# Patient Record
Sex: Female | Born: 1945 | Race: White | Hispanic: No | Smoking: Never smoker
Health system: Southern US, Community
[De-identification: ages and names within clinical notes are randomized; demographics above are authoritative.]

## PROBLEM LIST (undated history)

## (undated) DIAGNOSIS — M7500 Adhesive capsulitis of unspecified shoulder: Secondary | ICD-10-CM

## (undated) DIAGNOSIS — F32A Depression, unspecified: Secondary | ICD-10-CM

## (undated) DIAGNOSIS — D649 Anemia, unspecified: Secondary | ICD-10-CM

## (undated) DIAGNOSIS — N6019 Diffuse cystic mastopathy of unspecified breast: Secondary | ICD-10-CM

## (undated) DIAGNOSIS — K5790 Diverticulosis of intestine, part unspecified, without perforation or abscess without bleeding: Secondary | ICD-10-CM

## (undated) DIAGNOSIS — F419 Anxiety disorder, unspecified: Secondary | ICD-10-CM

## (undated) DIAGNOSIS — C4491 Basal cell carcinoma of skin, unspecified: Secondary | ICD-10-CM

## (undated) DIAGNOSIS — T7840XA Allergy, unspecified, initial encounter: Secondary | ICD-10-CM

## (undated) DIAGNOSIS — D709 Neutropenia, unspecified: Secondary | ICD-10-CM

## (undated) DIAGNOSIS — M1990 Unspecified osteoarthritis, unspecified site: Secondary | ICD-10-CM

## (undated) DIAGNOSIS — N200 Calculus of kidney: Secondary | ICD-10-CM

## (undated) DIAGNOSIS — F329 Major depressive disorder, single episode, unspecified: Secondary | ICD-10-CM

## (undated) DIAGNOSIS — N760 Acute vaginitis: Secondary | ICD-10-CM

## (undated) DIAGNOSIS — D131 Benign neoplasm of stomach: Secondary | ICD-10-CM

## (undated) DIAGNOSIS — K219 Gastro-esophageal reflux disease without esophagitis: Secondary | ICD-10-CM

## (undated) DIAGNOSIS — G43909 Migraine, unspecified, not intractable, without status migrainosus: Secondary | ICD-10-CM

## (undated) DIAGNOSIS — B019 Varicella without complication: Secondary | ICD-10-CM

## (undated) DIAGNOSIS — J302 Other seasonal allergic rhinitis: Secondary | ICD-10-CM

## (undated) DIAGNOSIS — K279 Peptic ulcer, site unspecified, unspecified as acute or chronic, without hemorrhage or perforation: Secondary | ICD-10-CM

## (undated) DIAGNOSIS — Z8719 Personal history of other diseases of the digestive system: Secondary | ICD-10-CM

## (undated) HISTORY — DX: Allergy, unspecified, initial encounter: T78.40XA

## (undated) HISTORY — DX: Diffuse cystic mastopathy of unspecified breast: N60.19

## (undated) HISTORY — DX: Varicella without complication: B01.9

## (undated) HISTORY — PX: COLONOSCOPY: SHX174

## (undated) HISTORY — DX: Anemia, unspecified: D64.9

## (undated) HISTORY — DX: Peptic ulcer, site unspecified, unspecified as acute or chronic, without hemorrhage or perforation: K27.9

## (undated) HISTORY — DX: Neutropenia, unspecified: D70.9

## (undated) HISTORY — DX: Anxiety disorder, unspecified: F41.9

## (undated) HISTORY — DX: Diverticulosis of intestine, part unspecified, without perforation or abscess without bleeding: K57.90

## (undated) HISTORY — DX: Unspecified osteoarthritis, unspecified site: M19.90

## (undated) HISTORY — DX: Gastro-esophageal reflux disease without esophagitis: K21.9

## (undated) HISTORY — DX: Migraine, unspecified, not intractable, without status migrainosus: G43.909

---

## 1982-09-10 HISTORY — PX: ABDOMINAL HYSTERECTOMY: SHX81

## 2004-10-25 ENCOUNTER — Ambulatory Visit: Payer: Self-pay | Admitting: Internal Medicine

## 2004-10-30 ENCOUNTER — Ambulatory Visit: Payer: Self-pay | Admitting: Internal Medicine

## 2005-10-04 ENCOUNTER — Ambulatory Visit: Payer: Self-pay | Admitting: Oncology

## 2006-02-27 ENCOUNTER — Ambulatory Visit: Payer: Self-pay | Admitting: Internal Medicine

## 2007-03-05 ENCOUNTER — Ambulatory Visit: Payer: Self-pay | Admitting: Internal Medicine

## 2008-05-24 ENCOUNTER — Ambulatory Visit: Payer: Self-pay | Admitting: Internal Medicine

## 2009-05-30 ENCOUNTER — Ambulatory Visit: Payer: Self-pay | Admitting: Internal Medicine

## 2009-06-02 ENCOUNTER — Ambulatory Visit: Payer: Self-pay | Admitting: Internal Medicine

## 2010-05-31 ENCOUNTER — Ambulatory Visit: Payer: Self-pay | Admitting: Internal Medicine

## 2010-08-15 ENCOUNTER — Ambulatory Visit: Payer: Self-pay | Admitting: Internal Medicine

## 2010-08-28 ENCOUNTER — Ambulatory Visit: Payer: Self-pay | Admitting: Internal Medicine

## 2010-09-12 ENCOUNTER — Ambulatory Visit: Payer: Self-pay | Admitting: Urology

## 2010-10-05 ENCOUNTER — Ambulatory Visit: Payer: Self-pay | Admitting: Urology

## 2010-10-16 ENCOUNTER — Ambulatory Visit: Payer: Self-pay | Admitting: Gastroenterology

## 2011-01-15 ENCOUNTER — Ambulatory Visit: Payer: Self-pay | Admitting: Urology

## 2011-07-12 ENCOUNTER — Ambulatory Visit: Payer: Self-pay | Admitting: Internal Medicine

## 2011-07-24 ENCOUNTER — Ambulatory Visit: Payer: Self-pay | Admitting: Internal Medicine

## 2011-11-19 DIAGNOSIS — F32A Depression, unspecified: Secondary | ICD-10-CM | POA: Insufficient documentation

## 2011-11-19 DIAGNOSIS — N760 Acute vaginitis: Secondary | ICD-10-CM | POA: Insufficient documentation

## 2011-11-19 DIAGNOSIS — K219 Gastro-esophageal reflux disease without esophagitis: Secondary | ICD-10-CM | POA: Insufficient documentation

## 2011-11-19 DIAGNOSIS — F320 Major depressive disorder, single episode, mild: Secondary | ICD-10-CM | POA: Insufficient documentation

## 2011-11-19 DIAGNOSIS — Z8719 Personal history of other diseases of the digestive system: Secondary | ICD-10-CM | POA: Insufficient documentation

## 2011-11-29 ENCOUNTER — Ambulatory Visit: Payer: Self-pay | Admitting: Family Medicine

## 2012-05-06 DIAGNOSIS — M7500 Adhesive capsulitis of unspecified shoulder: Secondary | ICD-10-CM | POA: Insufficient documentation

## 2012-05-06 DIAGNOSIS — M19019 Primary osteoarthritis, unspecified shoulder: Secondary | ICD-10-CM | POA: Insufficient documentation

## 2012-05-21 DIAGNOSIS — M542 Cervicalgia: Secondary | ICD-10-CM | POA: Insufficient documentation

## 2012-07-11 ENCOUNTER — Telehealth: Payer: Self-pay | Admitting: *Deleted

## 2012-07-11 MED ORDER — ZOLPIDEM TARTRATE 5 MG PO TABS: 5.0000 mg | ORAL_TABLET | Freq: Every evening | ORAL | Status: DC | PRN

## 2012-07-11 NOTE — Telephone Encounter (Signed)
Medication was called in 10/25 by Dr.Scott.

## 2012-07-17 ENCOUNTER — Telehealth: Payer: Self-pay | Admitting: Internal Medicine

## 2012-07-17 NOTE — Telephone Encounter (Signed)
Pt called her pharmacy has been sending a refill request since last Friday pt is out of [facility] meds [facility]  acifex she thinks 40mg   1 daily  This is the one pt has been out of   cymbalta 60mg   1 daily  vivelledot patch .0375 mg    Pt stated she is been out of meds for a week Please advise pt when this is called in

## 2012-07-17 NOTE — Telephone Encounter (Signed)
Caller: [name]/Patient; Patient Name: Brandi Ramos; PCP: [facility]; Best Callback Phone Number: [phone].  Pt calling today 07/17/12 regarding has been trying to get her medication refilled since 07/11/12.  Has contacted her pharmacy and made calls to office and medications still have not been called to her pharmacy.  Triager pulled chart in EPIC and see note where pt called in requesting refills and see note from Carrie Mew requesting MD refill meds and then note from Dr. Lorin Picket advising ok to refill with 3 refills each, however triager not able to view medication list to verify medication orders. The only medication listed under the medication tab is Ambien. OFFICE PLEASE CALL THESE SCRIPTS IN ASAP FOR PT SHE IS COMPLETLEY OUT OF MEDICATION.  [facility] [place] [phone].  PLEASE CALL PT BACK AT [phone] TO LET HER KNOW THIS HAS BEEN DONE ASAP.

## 2012-07-17 NOTE — Telephone Encounter (Signed)
Ok to refill each x 3.  i think the aciphex should be 20mg  q day.  Will need to clarify directions with her pharmacy and then can refill x 3

## 2012-07-18 MED ORDER — DULOXETINE HCL 60 MG PO CPEP: 60.0000 mg | ORAL_CAPSULE | Freq: Every day | ORAL | Status: DC

## 2012-07-18 MED ORDER — ESTRADIOL 0.0375 MG/24HR TD PTTW: 1.0000 | MEDICATED_PATCH | TRANSDERMAL | Status: DC

## 2012-07-18 NOTE — Telephone Encounter (Signed)
Prescriptions phoned into pharmacy.

## 2012-08-11 ENCOUNTER — Other Ambulatory Visit: Payer: Self-pay | Admitting: Internal Medicine

## 2012-08-11 NOTE — Telephone Encounter (Signed)
Pt is needing refill on Tropranolol 40 mg. [facility] [place]. She does have an appointment coming up on 09/23/11

## 2012-08-11 NOTE — Telephone Encounter (Signed)
Ok to refill propranolol 40mg  but will need to clarify with pt how she takes.  Ok to refill until appt.

## 2012-08-12 ENCOUNTER — Other Ambulatory Visit: Payer: Self-pay | Admitting: *Deleted

## 2012-08-12 MED ORDER — PROPRANOLOL HCL 40 MG PO TABS: 40.0000 mg | ORAL_TABLET | Freq: Three times a day (TID) | ORAL | Status: DC

## 2012-08-12 NOTE — Telephone Encounter (Signed)
Filled script for propranolol 40 mg

## 2012-08-12 NOTE — Telephone Encounter (Signed)
Called patient to clarify dosage of Propranolol  on cell and home phones;no answer on both lines and voice mail did not pick up

## 2012-08-29 ENCOUNTER — Telehealth: Payer: Self-pay | Admitting: Internal Medicine

## 2012-08-29 NOTE — Telephone Encounter (Signed)
Pt is needing refills on Topiramiate 25 mg and Ambien 5 mg (Generic) and [facility] [place].

## 2012-09-01 NOTE — Telephone Encounter (Signed)
Called in #30 with no refills ambien.  Called in topamax take 6 tablets daily #180 with 2 refills.

## 2012-09-21 ENCOUNTER — Encounter: Payer: Self-pay | Admitting: Internal Medicine

## 2012-09-21 DIAGNOSIS — K2960 Other gastritis without bleeding: Secondary | ICD-10-CM

## 2012-09-21 DIAGNOSIS — G43909 Migraine, unspecified, not intractable, without status migrainosus: Secondary | ICD-10-CM | POA: Insufficient documentation

## 2012-09-21 DIAGNOSIS — D72819 Decreased white blood cell count, unspecified: Secondary | ICD-10-CM | POA: Insufficient documentation

## 2012-09-22 ENCOUNTER — Encounter: Payer: Self-pay | Admitting: Internal Medicine

## 2012-09-22 ENCOUNTER — Ambulatory Visit (INDEPENDENT_AMBULATORY_CARE_PROVIDER_SITE_OTHER): Payer: BC Managed Care – PPO | Admitting: Internal Medicine

## 2012-09-22 VITALS — BP 116/74 | HR 64 | Temp 98.7°F | Ht 64.0 in | Wt 119.8 lb

## 2012-09-22 DIAGNOSIS — Z139 Encounter for screening, unspecified: Secondary | ICD-10-CM

## 2012-09-22 DIAGNOSIS — G43909 Migraine, unspecified, not intractable, without status migrainosus: Secondary | ICD-10-CM

## 2012-09-22 DIAGNOSIS — R5381 Other malaise: Secondary | ICD-10-CM

## 2012-09-22 DIAGNOSIS — R5383 Other fatigue: Secondary | ICD-10-CM

## 2012-09-22 DIAGNOSIS — D72819 Decreased white blood cell count, unspecified: Secondary | ICD-10-CM

## 2012-09-22 LAB — LIPID PANEL
HDL: 54.3 mg/dL (ref >39.00)
LDL Cholesterol: 104 mg/dL — ABNORMAL HIGH (ref 0–99)
Total CHOL/HDL Ratio: 3
Triglycerides: 69 mg/dL (ref 0.0–149.0)
VLDL: 13.8 mg/dL (ref 0.0–40.0)

## 2012-09-22 LAB — CBC WITH DIFFERENTIAL/PLATELET
Basophils Absolute: 0 10*3/uL (ref 0.0–0.1)
Eosinophils Absolute: 0.1 10*3/uL (ref 0.0–0.7)
Hemoglobin: 12.5 g/dL (ref 12.0–15.0)
Lymphocytes Relative: 25.1 % (ref 12.0–46.0)
MCHC: 33.5 g/dL (ref 30.0–36.0)
Neutro Abs: 2.8 10*3/uL (ref 1.4–7.7)
RDW: 13.6 % (ref 11.5–14.6)

## 2012-09-22 LAB — COMPREHENSIVE METABOLIC PANEL
ALT: 15 U/L (ref 0–35)
AST: 23 U/L (ref 0–37)
Alkaline Phosphatase: 48 U/L (ref 39–117)
Calcium: 8.7 mg/dL (ref 8.4–10.5)
Chloride: 107 mEq/L (ref 96–112)
Creatinine, Ser: 0.7 mg/dL (ref 0.4–1.2)
Total Bilirubin: 0.5 mg/dL (ref 0.3–1.2)

## 2012-09-22 MED ORDER — ZOLPIDEM TARTRATE ER 6.25 MG PO TBCR: 6.2500 mg | EXTENDED_RELEASE_TABLET | Freq: Every evening | ORAL | Status: DC | PRN

## 2012-09-22 MED ORDER — FLUTICASONE PROPIONATE 50 MCG/ACT NA SUSP: 2.0000 | Freq: Every day | NASAL | Status: DC

## 2012-09-22 NOTE — Progress Notes (Signed)
Subjective:    Patient ID: Brandi Ramos, female    DOB: 03-13-1946, 67 y.o.   MRN: [id number]  HPI 67 year old female with past history of migraine/tension headaches, FCD and diverticulosis who comes in today to follow up on these issues as well as for a complete physical exam.  She states she is doing relatively well.  Planning to retire 4/14.  She was having neck and left shoulder discomfort.  Was seen in [place].  Xray revealed degenerative changes - C2-C3.  (Dr Roselyn Bering and Dr Dorann Ou).  She has follow up with neurology this week.  Headaches stable.  Some minimal congestion.  No significant cough.  No sob.    Past Medical History  Diagnosis Date  . Migraine headache   . Neutropenia     worked up - Dr Doylene Canning, slightly elevated ANA  . PUD (peptic ulcer disease)   . Anxiety   . Fibrocystic breast disease   . Diverticulosis   . GERD (gastroesophageal reflux disease)   . Arthritis   . Chicken pox      Current Outpatient Prescriptions on File Prior to Visit  Medication Sig Dispense Refill  . DULoxetine (CYMBALTA) 60 MG capsule Take 1 capsule (60 mg total) by mouth daily.  30 capsule  3  . estradiol (VIVELLE-DOT) 0.0375 MG/24HR Place 1 patch onto the skin as directed.  8 patch  3  . fexofenadine (ALLEGRA) 60 MG tablet Take 60 mg by mouth 2 (two) times daily.      . propranolol (INDERAL) 40 MG tablet Take 40 mg by mouth 2 (two) times daily.      . rizatriptan (MAXALT) 10 MG tablet Take 10 mg by mouth as needed. May repeat in 2 hours if needed      . fluticasone (FLONASE) 50 MCG/ACT nasal spray Place 2 sprays into the nose daily.  16 g  1  . RABEprazole Sodium (ACIPHEX PO) Take 20 mg by mouth daily.         Review of Systems Patient denies any headache currently.  No lightheadedness or dizziness. Some nasal congestion.  No chest pain, tightness or palpitations.  No increased shortness of breath, cough or congestion.  No nausea or vomiting.  No abdominal pain or cramping.  No bowel  change, such as diarrhea, constipation, BRBPR or melana.  No urine change.   Saw chiropractor for her neck and shoulder.  Doing better.       Objective:   Physical Exam Filed Vitals:   09/22/12 0800  BP: 116/74  Pulse: 64  Temp: 98.7 F (66.57 C)   67 year old female in no acute distress.   HEENT:  Nares- clear.  Oropharynx - without lesions. NECK:  Supple.  Nontender.  No audible bruit.  HEART:  Appears to be regular. LUNGS:  No crackles or wheezing audible.  Respirations even and unlabored.  RADIAL PULSE:  Equal bilaterally.    BREASTS:  No nipple discharge or nipple retraction present.  Could not appreciate any distinct nodules or axillary adenopathy.  ABDOMEN:  Soft, nontender.  Bowel sounds present and normal.  No audible abdominal bruit.  GU:  Normal external genitalia.  Vaginal vault without lesions.  S/p hysterectomy.  No pap performed.  Could not appreciate any adnexal masses or tenderness.   RECTAL:  Heme negative.   EXTREMITIES:  No increased edema present.  DP pulses palpable and equal bilaterally.           Assessment & Plan:  INCREASED  PSYCHOSOCIAL STRESSORS.  Doing better.  Follow.   NASAL CONGESTION.  Saline nasal flushes.  Flonase nasal spray as directed.  Notify me if symptoms worsened.    CARDIOVASCULAR.  Asymptomatic.    PULMONARY.  Breathing stable.  Follow.    HEALTH MAINTENANCE.  Physical today.  S/p hysterectomy and does not require yearly pap smears.  Colonoscopy 04/26/08 - adenomatous polyp in the cecum, diverticulosis and internal hemorrhoids.  Recommended follow up colonoscopy in five years.  Bone density normal 06/22/08.

## 2012-09-24 ENCOUNTER — Telehealth: Payer: Self-pay | Admitting: Internal Medicine

## 2012-09-24 ENCOUNTER — Encounter: Payer: Self-pay | Admitting: *Deleted

## 2012-09-24 MED ORDER — AMOXICILLIN 875 MG PO TABS: 875.0000 mg | ORAL_TABLET | Freq: Two times a day (BID) | ORAL | Status: AC

## 2012-09-24 NOTE — Telephone Encounter (Signed)
Pt is saying that she has been taking Mucinex and nasal spray (Flonase) since Monday and it has not worked at all. She is owrse with coughing up mucous and more stuffed up. She was wanting to know if an antibiotic could be called in ??  [facility] [place]

## 2012-09-24 NOTE — Telephone Encounter (Signed)
Patient called to confirm no allergy to penicillin or amoxicillin. Patient sated that there was no allergy. Prescription called in to pharmacy.

## 2012-09-24 NOTE — Telephone Encounter (Signed)
Confirm no allergy to penicillin or amoxicillin.  If no then can call in amoxicillin 875mg  bid x 10 days.  Let me know if persistent problems.

## 2012-09-28 ENCOUNTER — Encounter: Payer: Self-pay | Admitting: Internal Medicine

## 2012-09-28 NOTE — Assessment & Plan Note (Signed)
Seeing neurology.  Stable.  Has follow up this week.

## 2012-09-28 NOTE — Assessment & Plan Note (Signed)
Recheck cbc.  Has been worked up by hematology.

## 2012-10-07 ENCOUNTER — Ambulatory Visit: Payer: Self-pay | Admitting: Internal Medicine

## 2012-10-27 ENCOUNTER — Encounter: Payer: Self-pay | Admitting: Internal Medicine

## 2012-10-27 ENCOUNTER — Other Ambulatory Visit: Payer: Self-pay | Admitting: Internal Medicine

## 2012-10-27 NOTE — Telephone Encounter (Signed)
Pt is wanting to know if she could get her Xanax refilled.

## 2012-10-28 MED ORDER — ALPRAZOLAM 0.25 MG PO TABS: 0.2500 mg | ORAL_TABLET | Freq: Every day | ORAL | Status: DC | PRN

## 2012-10-28 NOTE — Telephone Encounter (Signed)
Xanax is not on her me list, 25 mg

## 2012-10-28 NOTE — Telephone Encounter (Signed)
Pt calling again regarding xanax, states she needs more.  Pt states she has an appt with Dr. Lorin Picket on Thursday morning but wants more medication before that appointment because she does not want to run out.  [facility] [place]  Pt will be at work until 4p.m. [phone].  After 5pm she will be home [phone].

## 2012-10-28 NOTE — Telephone Encounter (Signed)
Called in xanax .25mg  one po q day #20 with one refill.

## 2012-10-30 ENCOUNTER — Encounter: Payer: Self-pay | Admitting: Internal Medicine

## 2012-10-30 ENCOUNTER — Ambulatory Visit (INDEPENDENT_AMBULATORY_CARE_PROVIDER_SITE_OTHER): Payer: BC Managed Care – PPO | Admitting: Internal Medicine

## 2012-10-30 VITALS — BP 120/70 | HR 65 | Temp 97.9°F | Ht 61.0 in

## 2012-10-30 DIAGNOSIS — G43909 Migraine, unspecified, not intractable, without status migrainosus: Secondary | ICD-10-CM

## 2012-10-30 MED ORDER — ALPRAZOLAM 0.25 MG PO TABS: ORAL_TABLET | ORAL | Status: DC

## 2012-10-30 MED ORDER — FLUCONAZOLE 150 MG PO TABS: 150.0000 mg | ORAL_TABLET | Freq: Once | ORAL | Status: DC

## 2012-11-09 ENCOUNTER — Encounter: Payer: Self-pay | Admitting: Internal Medicine

## 2012-11-09 NOTE — Progress Notes (Signed)
Subjective:    Patient ID: Brandi Ramos, female    DOB: 02/25/1946, 67 y.o.   MRN: [id number]  HPI 67 year old female with past history of migraine/tension headaches, FCD and diverticulosis who comes in today as a work in to discuss some recent increased stress.   She states she had been doing relatively well.  Planning to retire 4/14.  She had vomiting and diarrhea a couple of weeks ago.  Increased stress with the snow.  Started with some increased stress and issues with her husbands death.  Became more anxious.  Started taking xanax bid and this has helped.  She feels better now.  Still taking xanax.  Trying to cut down to q day now.  On paxil.   Physically, bowels are back to normal. Eating now.      Past Medical History  Diagnosis Date  . Migraine headache   . Neutropenia     worked up - Dr Doylene Canning, slightly elevated ANA  . PUD (peptic ulcer disease)   . Anxiety   . Fibrocystic breast disease   . Diverticulosis   . GERD (gastroesophageal reflux disease)   . Arthritis   . Chicken pox      Current Outpatient Prescriptions on File Prior to Visit  Medication Sig Dispense Refill  . DULoxetine (CYMBALTA) 60 MG capsule Take 1 capsule (60 mg total) by mouth daily.  30 capsule  3  . estradiol (VIVELLE-DOT) 0.0375 MG/24HR Place 1 patch onto the skin as directed.  8 patch  3  . fexofenadine (ALLEGRA) 60 MG tablet Take 60 mg by mouth 2 (two) times daily.      . fluticasone (FLONASE) 50 MCG/ACT nasal spray Place 2 sprays into the nose daily.  16 g  1  . propranolol (INDERAL) 40 MG tablet Take 40 mg by mouth daily.       . RABEprazole Sodium (ACIPHEX PO) Take 20 mg by mouth daily.       . rizatriptan (MAXALT) 10 MG tablet Take 10 mg by mouth as needed. May repeat in 2 hours if needed      . Topiramate ER (TROKENDI XR) 50 MG CP24 Take 1 tablet by mouth at bedtime.       Marland Kitchen zolpidem (AMBIEN CR) 6.25 MG CR tablet Take 1 tablet (6.25 mg total) by mouth at bedtime as needed for sleep.  30 tablet  0    No current facility-administered medications on file prior to visit.    Review of Systems Patient denies any headache currently.  No lightheadedness or dizziness.  No chest pain, tightness or palpitations.  No increased shortness of breath, cough or congestion.  No nausea or vomiting now.  Eating normally now.  Appetite improved.   No abdominal pain or cramping.  No bowel change, such as diarrhea, constipation, BRBPR or melana.  Diarrhea resolved.  No urine change.   Saw chiropractor for her neck and shoulder.  Doing better.  Increased stress and anxiety as outlined.  Doing better.         Objective:   Physical Exam  Filed Vitals:   10/30/12 0801  BP: 120/70  Pulse: 65  Temp: 97.9 F (71.18 C)   67 year old female in no acute distress.   HEENT:  Nares- clear.  Oropharynx - without lesions. NECK:  Supple.  Nontender.  No audible bruit.  HEART:  Appears to be regular. LUNGS:  No crackles or wheezing audible.  Respirations even and unlabored.  RADIAL PULSE:  Equal bilaterally.  ABDOMEN:  Soft, nontender.  Bowel sounds present and normal.  No audible abdominal bruit.   EXTREMITIES:  No increased edema present.  DP pulses palpable and equal bilaterally.           Assessment & Plan:  INCREASED PSYCHOSOCIAL STRESSORS.  See above.  Doing better.   Taking xanax.  Will continue xanax for now.  Follow.  On paxil.  Get her back in soon to reassess.  Follow closely.      CARDIOVASCULAR.  Asymptomatic.    PULMONARY.  Breathing stable.  Follow.    HEALTH MAINTENANCE.  Physical last visit.  S/p hysterectomy and does not require yearly pap smears.  Colonoscopy 04/26/08 - adenomatous polyp in the cecum, diverticulosis and internal hemorrhoids.  Recommended follow up colonoscopy in five years.  Bone density normal 06/22/08.

## 2012-11-09 NOTE — Assessment & Plan Note (Signed)
Seeing neurology.  Stable.   

## 2012-11-24 ENCOUNTER — Encounter: Payer: Self-pay | Admitting: Internal Medicine

## 2012-11-24 ENCOUNTER — Ambulatory Visit (INDEPENDENT_AMBULATORY_CARE_PROVIDER_SITE_OTHER): Payer: BC Managed Care – PPO | Admitting: Internal Medicine

## 2012-11-24 VITALS — BP 120/60 | HR 80 | Temp 97.7°F | Ht 61.0 in

## 2012-11-24 DIAGNOSIS — R1013 Epigastric pain: Secondary | ICD-10-CM

## 2012-11-24 DIAGNOSIS — D72819 Decreased white blood cell count, unspecified: Secondary | ICD-10-CM

## 2012-11-24 DIAGNOSIS — R0989 Other specified symptoms and signs involving the circulatory and respiratory systems: Secondary | ICD-10-CM

## 2012-11-24 DIAGNOSIS — G43909 Migraine, unspecified, not intractable, without status migrainosus: Secondary | ICD-10-CM

## 2012-11-24 LAB — CBC WITH DIFFERENTIAL/PLATELET
Basophils Relative: 0.2 % (ref 0.0–3.0)
Eosinophils Absolute: 0 10*3/uL (ref 0.0–0.7)
Eosinophils Relative: 0.1 % (ref 0.0–5.0)
Lymphocytes Relative: 24 % (ref 12.0–46.0)
Monocytes Absolute: 0.6 10*3/uL (ref 0.1–1.0)
Neutrophils Relative %: 65.7 % (ref 43.0–77.0)
Platelets: 163 10*3/uL (ref 150.0–400.0)
RBC: 4.09 Mil/uL (ref 3.87–5.11)
WBC: 5.8 10*3/uL (ref 4.5–10.5)

## 2012-11-24 LAB — LIPASE: Lipase: 129 U/L — ABNORMAL HIGH (ref 11.0–59.0)

## 2012-11-24 LAB — HEPATIC FUNCTION PANEL
ALT: 18 U/L (ref 0–35)
Alkaline Phosphatase: 46 U/L (ref 39–117)
Bilirubin, Direct: 0.1 mg/dL (ref 0.0–0.3)
Total Bilirubin: 0.8 mg/dL (ref 0.3–1.2)
Total Protein: 6.7 g/dL (ref 6.0–8.3)

## 2012-11-25 ENCOUNTER — Ambulatory Visit: Payer: Self-pay | Admitting: Internal Medicine

## 2012-11-27 ENCOUNTER — Telehealth: Payer: Self-pay | Admitting: Internal Medicine

## 2012-11-27 NOTE — Telephone Encounter (Signed)
Patient wanting her ultrasound results.

## 2012-11-28 NOTE — Telephone Encounter (Signed)
See other message

## 2012-11-28 NOTE — Telephone Encounter (Signed)
Patient calling again wanting her ultrasound results.

## 2012-11-30 ENCOUNTER — Encounter: Payer: Self-pay | Admitting: Internal Medicine

## 2012-11-30 NOTE — Progress Notes (Signed)
Subjective:    Patient ID: Brandi Ramos, female    DOB: 19-Apr-1946, 67 y.o.   MRN: [id number]  HPI 67 year old female with past history of migraine/tension headaches, FCD and diverticulosis who comes in today for a scheduled follow up.  I saw her last visit and she was having some increased anxiety and stress.  See last note for details.  Was instructed she could use the xanax as directed.  She is now taking it q day.  (has cut down from bid).  Feels better and is overall doing better.  Planning to retire 4/14.  Is a little anxious about this. She is having some epigastric discomfort.  Intermittent.  When her stomach is empty - worse.  Food does not make it worse.  No nausea or vomiting.  Is having some minimal acid reflux.  She called Dr Reyes Ivan office.  They instructed her to take carafate bid and continue her aciphex q day.  She is due to follow up there next week.  Bowels appear to be stable.       Past Medical History  Diagnosis Date  . Migraine headache   . Neutropenia     worked up - Dr Doylene Canning, slightly elevated ANA  . PUD (peptic ulcer disease)   . Anxiety   . Fibrocystic breast disease   . Diverticulosis   . GERD (gastroesophageal reflux disease)   . Arthritis   . Chicken pox      Current Outpatient Prescriptions on File Prior to Visit  Medication Sig Dispense Refill  . ALPRAZolam (XANAX) 0.25 MG tablet One tablet bid prn  40 tablet  0  . DULoxetine (CYMBALTA) 60 MG capsule Take 1 capsule (60 mg total) by mouth daily.  30 capsule  3  . estradiol (VIVELLE-DOT) 0.0375 MG/24HR Place 1 patch onto the skin as directed.  8 patch  3  . fexofenadine (ALLEGRA) 60 MG tablet Take 60 mg by mouth 2 (two) times daily.      . fluticasone (FLONASE) 50 MCG/ACT nasal spray Place 2 sprays into the nose daily.  16 g  1  . propranolol (INDERAL) 40 MG tablet Take 40 mg by mouth daily.       . RABEprazole Sodium (ACIPHEX PO) Take 20 mg by mouth daily.       . rizatriptan (MAXALT) 10 MG tablet  Take 10 mg by mouth as needed. May repeat in 2 hours if needed      . Topiramate ER (TROKENDI XR) 50 MG CP24 Take 1 tablet by mouth at bedtime.       Marland Kitchen zolpidem (AMBIEN CR) 6.25 MG CR tablet Take 1 tablet (6.25 mg total) by mouth at bedtime as needed for sleep.  30 tablet  0  . fluconazole (DIFLUCAN) 150 MG tablet Take 1 tablet (150 mg total) by mouth once.  1 tablet  0   No current facility-administered medications on file prior to visit.    Review of Systems Patient denies any headache currently.  No lightheadedness or dizziness.  No chest pain, tightness or palpitations.  No increased shortness of breath, cough or congestion.  No nausea or vomiting.  Eating normally now.  Appetite improved.  Does report the epigastric discomfort as outlined.  No bowel change, such as diarrhea, constipation, BRBPR or melana.  No urine change.   Stress and anxiety improved.  Doing better.         Objective:   Physical Exam  Filed Vitals:   11/24/12 7829  BP: 120/60  Pulse: 80  Temp: 97.7 F (77.109 C)   67 year old female in no acute distress.   HEENT:  Nares- clear.  Oropharynx - without lesions. NECK:  Supple.  Nontender.  No audible bruit.  HEART:  Appears to be regular. LUNGS:  No crackles or wheezing audible.  Respirations even and unlabored.  RADIAL PULSE:  Equal bilaterally.  ABDOMEN:  Soft.  Minimal tenderness to palpation over the epigastric region.   Bowel sounds present and normal.  Audible abdominal bruit.   EXTREMITIES:  No increased edema present.  DP pulses palpable and equal bilaterally.           Assessment & Plan:  INCREASED PSYCHOSOCIAL STRESSORS.  See above.  Doing better.   Taking xanax daily now.  Down from bid.   Will continue xanax for now.  Follow.  On paxil.  Follow closely.  EPIGASTRIC PAIN.  Will check cbc, liver panel and amylase/lipase.  Schedule an abdominal ultrasound (also to evaluate the aorta- given bruit).  Continue on the PPI and carafate.  Keep appt with GI next  week.  If any acute change in symptoms or problems, will need to be reevaluated.       CARDIOVASCULAR.  Asymptomatic.    PULMONARY.  Breathing stable.  Follow.    HEALTH MAINTENANCE.  Physical 09/22/12.  S/p hysterectomy and does not require yearly pap smears.  Colonoscopy 04/26/08 - adenomatous polyp in the cecum, diverticulosis and internal hemorrhoids.  Recommended follow up colonoscopy in five years.  Bone density normal 06/22/08.

## 2012-11-30 NOTE — Assessment & Plan Note (Signed)
Seeing neurology.  Stable.   

## 2012-11-30 NOTE — Assessment & Plan Note (Signed)
Recheck cbc.  Has been worked up by hematology.  Has been stable.

## 2012-12-08 ENCOUNTER — Ambulatory Visit: Payer: Self-pay | Admitting: Gastroenterology

## 2012-12-10 LAB — PATHOLOGY REPORT

## 2012-12-13 DIAGNOSIS — K2960 Other gastritis without bleeding: Secondary | ICD-10-CM | POA: Insufficient documentation

## 2012-12-15 ENCOUNTER — Other Ambulatory Visit: Payer: Self-pay | Admitting: *Deleted

## 2012-12-15 ENCOUNTER — Other Ambulatory Visit: Payer: Self-pay | Admitting: Internal Medicine

## 2012-12-15 MED ORDER — DULOXETINE HCL 60 MG PO CPEP: 60.0000 mg | ORAL_CAPSULE | Freq: Every day | ORAL | Status: DC

## 2012-12-15 NOTE — Telephone Encounter (Signed)
Refill request  Rabeprazole 20 mg tab  #30  Take one tablet by mouth every day

## 2012-12-15 NOTE — Telephone Encounter (Signed)
Duloxetine refill needed.

## 2012-12-16 ENCOUNTER — Ambulatory Visit: Payer: Self-pay | Admitting: Gastroenterology

## 2012-12-16 MED ORDER — RABEPRAZOLE SODIUM 20 MG PO TBEC: 20.0000 mg | DELAYED_RELEASE_TABLET | Freq: Every day | ORAL | Status: DC

## 2012-12-16 MED ORDER — ESTRADIOL 0.0375 MG/24HR TD PTTW: 1.0000 | MEDICATED_PATCH | TRANSDERMAL | Status: DC

## 2012-12-16 NOTE — Telephone Encounter (Signed)
Rx sent in to pharmacy. 

## 2012-12-25 ENCOUNTER — Encounter: Payer: Self-pay | Admitting: Internal Medicine

## 2012-12-29 DIAGNOSIS — M25569 Pain in unspecified knee: Secondary | ICD-10-CM | POA: Insufficient documentation

## 2012-12-29 DIAGNOSIS — D481 Neoplasm of uncertain behavior of connective and other soft tissue: Secondary | ICD-10-CM | POA: Insufficient documentation

## 2013-01-05 ENCOUNTER — Other Ambulatory Visit: Payer: Self-pay | Admitting: Internal Medicine

## 2013-01-05 MED ORDER — ALPRAZOLAM 0.25 MG PO TABS: ORAL_TABLET | ORAL | Status: DC

## 2013-01-05 NOTE — Telephone Encounter (Signed)
Refilled xanax x 1 

## 2013-01-05 NOTE — Telephone Encounter (Signed)
30 day Rx faxed

## 2013-01-05 NOTE — Telephone Encounter (Signed)
Cell phone # [phone] Pt called checking on her rx  asifex  Pt is out of These she stated [facility] request  last week Pt also needs refill alprazolam   Pt has 2 left   Please advise pt when these have been called in

## 2013-01-05 NOTE — Telephone Encounter (Signed)
Pt requesting RF on Xanax- Aciphex was sent in on 4/8 (pharmacy confirmed) & pt aware

## 2013-01-21 ENCOUNTER — Ambulatory Visit (INDEPENDENT_AMBULATORY_CARE_PROVIDER_SITE_OTHER): Payer: 59 | Admitting: Internal Medicine

## 2013-01-21 ENCOUNTER — Encounter: Payer: Self-pay | Admitting: Internal Medicine

## 2013-01-21 VITALS — BP 120/70 | HR 71 | Temp 98.6°F | Ht 61.0 in | Wt 119.8 lb

## 2013-01-21 DIAGNOSIS — K2960 Other gastritis without bleeding: Secondary | ICD-10-CM

## 2013-01-21 DIAGNOSIS — D72819 Decreased white blood cell count, unspecified: Secondary | ICD-10-CM

## 2013-01-21 DIAGNOSIS — G43909 Migraine, unspecified, not intractable, without status migrainosus: Secondary | ICD-10-CM

## 2013-01-21 MED ORDER — ALPRAZOLAM 0.25 MG PO TABS: 0.2500 mg | ORAL_TABLET | Freq: Every day | ORAL | Status: DC | PRN

## 2013-01-22 ENCOUNTER — Telehealth: Payer: Self-pay | Admitting: *Deleted

## 2013-01-22 NOTE — Telephone Encounter (Signed)
Pt informed

## 2013-01-22 NOTE — Telephone Encounter (Signed)
Tell Brandi Ramos that I called her pharmacy and they are faxing over a prior authorization form for her cymbalta. The pharmacist said that they would give her some to have until we can get the prior auth done.  Said for her just to come in and they will give her some.

## 2013-01-22 NOTE — Telephone Encounter (Signed)
Pt was seen yesterday & mentioned that her Cymbalta req'd a PA. Wants to know if she can restart her Paxil until we get approval for the Cymbalta. Please advise

## 2013-01-23 ENCOUNTER — Telehealth: Payer: Self-pay | Admitting: *Deleted

## 2013-01-23 NOTE — Telephone Encounter (Signed)
Called [phone] for prior authorization on the Duloxtine DR 60 mg, form is being faxed over now

## 2013-01-26 ENCOUNTER — Telehealth: Payer: Self-pay | Admitting: *Deleted

## 2013-01-26 NOTE — Telephone Encounter (Signed)
Called pt to let her know that her Prior authorization for Duloxtine DR 60 mg was still in Process

## 2013-01-26 NOTE — Telephone Encounter (Signed)
Pt called and to give you a number to get her prior authoization   [phone]  For cymbolta  Pt stated that if you call this number today this will be taken care of today. Pt stated she has been out of meds for 2 weeks.  Please call pt when this is done  Pt is completely out of meds Pt is upset that it is taking so long to get her meds  Please explain to her why.

## 2013-01-26 NOTE — Telephone Encounter (Signed)
Criteria question fax form was placed in Dr. Anne Hahn

## 2013-01-26 NOTE — Telephone Encounter (Signed)
Called [phone] for Prior authorization status and they said the PA is in Process and a fax of denial or approval will be faxed either today or tomorrow

## 2013-01-27 NOTE — Telephone Encounter (Signed)
Prior authorization for Cymbalta was Denied, called [phone] to see why it was denied, they stated the needed a Letter from the pt doctor stating why she needs the medications and what other medications has she tried?  Fax letter : [phone]

## 2013-01-27 NOTE — Telephone Encounter (Signed)
Spoke with pt, & let her know that we still haven't heard from her ins company. I told her that if we havent heard by Friday, we will see about switching back to Paxil at her request. The pharmacy gave her 5 days worth for now.

## 2013-01-27 NOTE — Telephone Encounter (Signed)
The patient is going to call the pharmacy to see if they will give her a few days of her Cymbalta . She does not want to go any longer without having her medication . She is willing to go back on Paxil only after all efforts to get her medication approved by the insurance company are exhausted.

## 2013-01-28 NOTE — Telephone Encounter (Signed)
Prior Authorization Form for filled out by Dr. Lorin Picket and faxed to [phone]

## 2013-01-28 NOTE — Telephone Encounter (Signed)
Called [phone] PA is still pending

## 2013-01-28 NOTE — Telephone Encounter (Signed)
I completed another form with the requested information.  Has tried paxil.  I will place form on your computer.  Also, let pt know that her pharmacy had told me they would give her some cymbalta to have until prior auth went through.  See 01/22/13 note.  If appears pt was informed of this information - so should not have been out for last two weeks.

## 2013-01-30 NOTE — Telephone Encounter (Signed)
Received a fax from [facility], Cymbalta was APPROVED   [id number]

## 2013-01-30 NOTE — Telephone Encounter (Signed)
Pt informed

## 2013-01-30 NOTE — Telephone Encounter (Signed)
Please notify pt that her cymbalta was approved.  Should be able to get at the pharmacy now.  Let me know if any problems.

## 2013-02-02 ENCOUNTER — Encounter: Payer: Self-pay | Admitting: Internal Medicine

## 2013-02-02 NOTE — Assessment & Plan Note (Signed)
Follow cbc.  Has been worked up by hematology.  Has been stable.   

## 2013-02-02 NOTE — Assessment & Plan Note (Signed)
Seeing neurology.  Stable.   

## 2013-02-02 NOTE — Progress Notes (Signed)
Subjective:    Patient ID: Brandi Ramos, female    DOB: 1946-07-12, 67 y.o.   MRN: [id number]  HPI 67 year old female with past history of migraine/tension headaches, FCD and diverticulosis who comes in today for a scheduled follow up.  She has been having issues with increased anxiety and stress.  See previous notes for details.  Was instructed she could use the xanax as directed.  She is now taking it q day.  (has cut down from bid).  Feels better and is overall doing better.  Retired 4/14.  Saw GI.  Had EGD.  Had gastritis.  Had HIDA scan - function ok. On carafate and aciphex bid.  Saw GI two weeks ago.  Symptoms are improved.  Bowels stable.  Out of cymbalta. Needs prior authorization.  Feels the cymbalta works better tha paxil.       Past Medical History  Diagnosis Date  . Migraine headache   . Neutropenia     worked up - Dr Doylene Canning, slightly elevated ANA  . PUD (peptic ulcer disease)   . Anxiety   . Fibrocystic breast disease   . Diverticulosis   . GERD (gastroesophageal reflux disease)   . Arthritis   . Chicken pox      Current Outpatient Prescriptions on File Prior to Visit  Medication Sig Dispense Refill  . DULoxetine (CYMBALTA) 60 MG capsule Take 1 capsule (60 mg total) by mouth daily.  30 capsule  2  . estradiol (VIVELLE-DOT) 0.0375 MG/24HR Place 1 patch onto the skin as directed.  8 patch  3  . fexofenadine (ALLEGRA) 60 MG tablet Take 60 mg by mouth 2 (two) times daily.      . fluticasone (FLONASE) 50 MCG/ACT nasal spray Place 2 sprays into the nose daily.  16 g  1  . propranolol (INDERAL) 40 MG tablet Take 40 mg by mouth daily.       . RABEprazole (ACIPHEX) 20 MG tablet Take 1 tablet (20 mg total) by mouth daily.  30 tablet  5  . rizatriptan (MAXALT) 10 MG tablet Take 10 mg by mouth as needed. May repeat in 2 hours if needed      . Topiramate ER (TROKENDI XR) 50 MG CP24 Take 1 tablet by mouth at bedtime.       Marland Kitchen zolpidem (AMBIEN CR) 6.25 MG CR tablet Take 1 tablet (6.25  mg total) by mouth at bedtime as needed for sleep.  30 tablet  0  . fluconazole (DIFLUCAN) 150 MG tablet Take 1 tablet (150 mg total) by mouth once.  1 tablet  0   No current facility-administered medications on file prior to visit.    Review of Systems Patient denies any headache currently.  No lightheadedness or dizziness.  No chest pain, tightness or palpitations.  No increased shortness of breath, cough or congestion.  No nausea or vomiting.  Eating normally now.  Appetite improved.  Epigastric discomfort has improved.  Work up as outlined.   No bowel change, such as diarrhea, constipation, BRBPR or melana.  No urine change.   Stress and anxiety improved.  Doing better.  Seeing GI.  Has retired.  Feels she is handling this relatively well.        Objective:   Physical Exam  Filed Vitals:   01/21/13 0854  BP: 120/70  Pulse: 71  Temp: 98.6 F (73 C)   67 year old female in no acute distress.   HEENT:  Nares- clear.  Oropharynx -  without lesions. NECK:  Supple.  Nontender.  No audible bruit.  HEART:  Appears to be regular. LUNGS:  No crackles or wheezing audible.  Respirations even and unlabored.  RADIAL PULSE:  Equal bilaterally.  ABDOMEN:  Soft.  Minimal tenderness to palpation over the epigastric region.   Bowel sounds present and normal.  Audible abdominal bruit.   EXTREMITIES:  No increased edema present.  DP pulses palpable and equal bilaterally.           Assessment & Plan:  INCREASED PSYCHOSOCIAL STRESSORS.  Doing better.   Taking xanax daily now.  Down from bid.   Will continue xanax for now.  Follow.  On cymbalta.  Follow closely.  EPIGASTRIC PAIN.  Saw GI.  Had HIDA scan.  Normal function.  EGD - gastritis.  On carafate and aciphex.  Doing better.  Continue to f/u with GI.        CARDIOVASCULAR.  Asymptomatic.    PULMONARY.  Breathing stable.  Follow.    HEALTH MAINTENANCE.  Physical 09/22/12.  S/p hysterectomy and does not require yearly pap smears.  Colonoscopy  04/26/08 - adenomatous polyp in the cecum, diverticulosis and internal hemorrhoids.  Recommended follow up colonoscopy in five years.  Bone density normal 06/22/08.

## 2013-02-02 NOTE — Assessment & Plan Note (Signed)
EGD as outlined.  On carafate and aciphex.  Better.  Follow.    

## 2013-02-04 NOTE — Telephone Encounter (Signed)
Rx approved (see prior note)

## 2013-02-12 ENCOUNTER — Encounter: Payer: Self-pay | Admitting: Internal Medicine

## 2013-03-12 ENCOUNTER — Telehealth: Payer: Self-pay | Admitting: Internal Medicine

## 2013-03-12 MED ORDER — ALPRAZOLAM 0.25 MG PO TABS: 0.2500 mg | ORAL_TABLET | Freq: Every day | ORAL | Status: DC | PRN

## 2013-03-12 NOTE — Telephone Encounter (Signed)
ALPRAZolam (XANAX) 0.25 MG tablet

## 2013-03-12 NOTE — Telephone Encounter (Signed)
Refilled #30 with no refills.   

## 2013-03-12 NOTE — Telephone Encounter (Signed)
Okay to refill? 

## 2013-03-12 NOTE — Telephone Encounter (Signed)
faxed

## 2013-03-12 NOTE — Telephone Encounter (Signed)
duplicate

## 2013-03-16 ENCOUNTER — Telehealth: Payer: Self-pay | Admitting: Internal Medicine

## 2013-03-16 NOTE — Telephone Encounter (Signed)
Pt states she has spoken with pharmacy and pharmacy told her they have faxed [place] paperwork to get her refill of Xanax but have not heard back from [place].  Pt states she is completely out of the medication and needs this today.

## 2013-03-26 ENCOUNTER — Ambulatory Visit (INDEPENDENT_AMBULATORY_CARE_PROVIDER_SITE_OTHER): Payer: 59 | Admitting: Internal Medicine

## 2013-03-26 ENCOUNTER — Encounter: Payer: Self-pay | Admitting: Internal Medicine

## 2013-03-26 VITALS — BP 110/70 | HR 62 | Temp 98.1°F | Ht 61.0 in | Wt 121.5 lb

## 2013-03-26 DIAGNOSIS — D649 Anemia, unspecified: Secondary | ICD-10-CM

## 2013-03-26 DIAGNOSIS — D72819 Decreased white blood cell count, unspecified: Secondary | ICD-10-CM

## 2013-03-26 DIAGNOSIS — G43909 Migraine, unspecified, not intractable, without status migrainosus: Secondary | ICD-10-CM

## 2013-03-26 DIAGNOSIS — K2960 Other gastritis without bleeding: Secondary | ICD-10-CM

## 2013-03-26 LAB — CBC WITH DIFFERENTIAL/PLATELET
Basophils Relative: 0.6 % (ref 0.0–3.0)
Eosinophils Absolute: 0.1 10*3/uL (ref 0.0–0.7)
Eosinophils Relative: 2.1 % (ref 0.0–5.0)
HCT: 34.4 % — ABNORMAL LOW (ref 36.0–46.0)
Hemoglobin: 11.6 g/dL — ABNORMAL LOW (ref 12.0–15.0)
Lymphocytes Relative: 53.3 % — ABNORMAL HIGH (ref 12.0–46.0)
Lymphs Abs: 1.6 10*3/uL (ref 0.7–4.0)
Monocytes Absolute: 0.4 10*3/uL (ref 0.1–1.0)
Monocytes Relative: 12 % (ref 3.0–12.0)
RBC: 3.96 Mil/uL (ref 3.87–5.11)
RDW: 14 % (ref 11.5–14.6)
WBC: 3 10*3/uL — ABNORMAL LOW (ref 4.5–10.5)

## 2013-03-26 MED ORDER — DULOXETINE HCL 60 MG PO CPEP: 60.0000 mg | ORAL_CAPSULE | Freq: Every day | ORAL | Status: DC

## 2013-03-26 MED ORDER — ALPRAZOLAM 0.25 MG PO TABS: 0.2500 mg | ORAL_TABLET | Freq: Every day | ORAL | Status: DC | PRN

## 2013-03-26 MED ORDER — ESTRADIOL 0.0375 MG/24HR TD PTTW: 1.0000 | MEDICATED_PATCH | TRANSDERMAL | Status: DC

## 2013-03-27 ENCOUNTER — Other Ambulatory Visit: Payer: Self-pay | Admitting: Internal Medicine

## 2013-03-27 DIAGNOSIS — D72819 Decreased white blood cell count, unspecified: Secondary | ICD-10-CM

## 2013-03-27 NOTE — Progress Notes (Signed)
LMTCB

## 2013-03-27 NOTE — Progress Notes (Signed)
Order placed for f/u cbc.   

## 2013-03-28 ENCOUNTER — Encounter: Payer: Self-pay | Admitting: Internal Medicine

## 2013-03-28 NOTE — Assessment & Plan Note (Signed)
Follow cbc.  Has been worked up by hematology.  Has been stable.   

## 2013-03-28 NOTE — Assessment & Plan Note (Signed)
Seeing neurology.  Stable.   

## 2013-03-28 NOTE — Assessment & Plan Note (Signed)
EGD as outlined.  On carafate and aciphex.  Better.  Follow.    

## 2013-03-28 NOTE — Progress Notes (Signed)
Subjective:    Patient ID: Temperance Kelemen, female    DOB: Jul 11, 1946, 67 y.o.   MRN: [id number]  HPI 67 year old female with past history of migraine/tension headaches, FCD and diverticulosis who comes in today for a scheduled follow up.  She has been having issues with increased anxiety and stress.  See previous notes for details.  Was instructed she could use the xanax as directed.  She is now taking it q day.   Feels better and is overall doing better.  Retired 4/14.  Saw GI.  Had EGD.  Had gastritis.  Had HIDA scan - function ok. On carafate and aciphex bid.  GI symptoms are improved.   Bowels stable.  Feels she is handling stress relatively well.        Past Medical History  Diagnosis Date  . Migraine headache   . Neutropenia     worked up - Dr Doylene Canning, slightly elevated ANA  . PUD (peptic ulcer disease)   . Anxiety   . Fibrocystic breast disease   . Diverticulosis   . GERD (gastroesophageal reflux disease)   . Arthritis   . Chicken pox      Current Outpatient Prescriptions on File Prior to Visit  Medication Sig Dispense Refill  . fexofenadine (ALLEGRA) 60 MG tablet Take 60 mg by mouth 2 (two) times daily.      . fluticasone (FLONASE) 50 MCG/ACT nasal spray Place 2 sprays into the nose daily.  16 g  1  . propranolol (INDERAL) 40 MG tablet Take 40 mg by mouth daily.       . RABEprazole (ACIPHEX) 20 MG tablet Take 1 tablet (20 mg total) by mouth daily.  30 tablet  5  . rizatriptan (MAXALT) 10 MG tablet Take 10 mg by mouth as needed. May repeat in 2 hours if needed      . Topiramate ER (TROKENDI XR) 50 MG CP24 Take 1 tablet by mouth at bedtime.        No current facility-administered medications on file prior to visit.    Review of Systems Patient denies any headache currently.  No lightheadedness or dizziness.  No chest pain, tightness or palpitations.  No increased shortness of breath, cough or congestion.  No nausea or vomiting.  Eating normally now.  Appetite improved.   Epigastric discomfort has improved.  Work up as outlined.   No bowel change, such as diarrhea, constipation, BRBPR or melana.  No urine change.   Stress and anxiety improved.  Doing better.  Seeing GI.  Has retired.  Feels she is handling this relatively well.        Objective:   Physical Exam  Filed Vitals:   03/26/13 1012  BP: 110/70  Pulse: 62  Temp: 98.1 F (73.97 C)   67 year old female in no acute distress.   HEENT:  Nares- clear.  Oropharynx - without lesions. NECK:  Supple.  Nontender.  No audible bruit.  HEART:  Appears to be regular. LUNGS:  No crackles or wheezing audible.  Respirations even and unlabored.  RADIAL PULSE:  Equal bilaterally.  ABDOMEN:  Soft.  Minimal tenderness to palpation over the epigastric region.   Bowel sounds present and normal.  Audible abdominal bruit.   EXTREMITIES:  No increased edema present.  DP pulses palpable and equal bilaterally.           Assessment & Plan:  INCREASED PSYCHOSOCIAL STRESSORS.  Doing better.   Taking xanax dailly.  Will continue xanax  for now.  Follow.  On cymbalta.  Follow closely.  EPIGASTRIC PAIN.  Saw GI.  Had HIDA scan.  Normal function.  EGD - gastritis.  On carafate and aciphex.  Doing better.  Continue to f/u with GI.        CARDIOVASCULAR.  Asymptomatic.    PULMONARY.  Breathing stable.  Follow.    HEALTH MAINTENANCE.  Physical 09/22/12.  S/p hysterectomy and does not require yearly pap smears.  Colonoscopy 04/26/08 - adenomatous polyp in the cecum, diverticulosis and internal hemorrhoids.  Recommended follow up colonoscopy in five years.  Bone density normal 06/22/08.

## 2013-03-30 ENCOUNTER — Other Ambulatory Visit: Payer: Self-pay | Admitting: Internal Medicine

## 2013-03-30 NOTE — Progress Notes (Signed)
Opened in error

## 2013-03-31 ENCOUNTER — Other Ambulatory Visit: Payer: Self-pay | Admitting: *Deleted

## 2013-04-01 ENCOUNTER — Other Ambulatory Visit: Payer: Self-pay | Admitting: *Deleted

## 2013-04-01 MED ORDER — TOPIRAMATE ER 50 MG PO CAP24: 1.0000 | ORAL_CAPSULE | Freq: Every day | ORAL | Status: DC

## 2013-04-01 NOTE — Telephone Encounter (Signed)
Refilled Topiramate #30-pt notified

## 2013-04-02 ENCOUNTER — Telehealth: Payer: Self-pay | Admitting: Internal Medicine

## 2013-04-02 NOTE — Telephone Encounter (Signed)
Left message on pt v/m explaining that her Rx is ready for pick up since yesterday

## 2013-04-02 NOTE — Telephone Encounter (Signed)
Pt called and spoke with you yesterday about her medication and she went back to her pharmacy and they say that they have not received her rx ??? She says she knows you called it in but she thinks something is going on with her pharmacy because they keep saying that they have not received it ???

## 2013-04-20 ENCOUNTER — Other Ambulatory Visit (INDEPENDENT_AMBULATORY_CARE_PROVIDER_SITE_OTHER): Payer: 59

## 2013-04-20 DIAGNOSIS — D72819 Decreased white blood cell count, unspecified: Secondary | ICD-10-CM

## 2013-04-20 LAB — CBC WITH DIFFERENTIAL/PLATELET
Basophils Relative: 0.6 % (ref 0.0–3.0)
Eosinophils Absolute: 0.1 10*3/uL (ref 0.0–0.7)
Lymphocytes Relative: 45.6 % (ref 12.0–46.0)
MCHC: 33.2 g/dL (ref 30.0–36.0)
Neutrophils Relative %: 38.7 % — ABNORMAL LOW (ref 43.0–77.0)
Platelets: 129 10*3/uL — ABNORMAL LOW (ref 150.0–400.0)
RBC: 3.97 Mil/uL (ref 3.87–5.11)
WBC: 2.7 10*3/uL — ABNORMAL LOW (ref 4.5–10.5)

## 2013-04-22 ENCOUNTER — Other Ambulatory Visit: Payer: Self-pay | Admitting: Internal Medicine

## 2013-04-22 DIAGNOSIS — D72819 Decreased white blood cell count, unspecified: Secondary | ICD-10-CM

## 2013-04-22 NOTE — Progress Notes (Signed)
Order placed for f/u cbc.   

## 2013-05-12 ENCOUNTER — Other Ambulatory Visit: Payer: Self-pay | Admitting: *Deleted

## 2013-05-12 ENCOUNTER — Encounter: Payer: Self-pay | Admitting: *Deleted

## 2013-05-12 MED ORDER — ALPRAZOLAM 0.25 MG PO TABS: 0.2500 mg | ORAL_TABLET | Freq: Every day | ORAL | Status: DC | PRN

## 2013-05-12 NOTE — Telephone Encounter (Signed)
Refilled xanax #30 x 1.

## 2013-05-13 ENCOUNTER — Encounter: Payer: Self-pay | Admitting: *Deleted

## 2013-05-13 ENCOUNTER — Other Ambulatory Visit (INDEPENDENT_AMBULATORY_CARE_PROVIDER_SITE_OTHER): Payer: 59

## 2013-05-13 DIAGNOSIS — D72819 Decreased white blood cell count, unspecified: Secondary | ICD-10-CM

## 2013-05-13 LAB — CBC WITH DIFFERENTIAL/PLATELET
Basophils Relative: 0.6 % (ref 0.0–3.0)
Eosinophils Absolute: 0.1 10*3/uL (ref 0.0–0.7)
Eosinophils Relative: 2.5 % (ref 0.0–5.0)
Hemoglobin: 11.7 g/dL — ABNORMAL LOW (ref 12.0–15.0)
Lymphocytes Relative: 45.1 % (ref 12.0–46.0)
MCHC: 34 g/dL (ref 30.0–36.0)
Monocytes Relative: 10.2 % (ref 3.0–12.0)
Neutro Abs: 1.2 10*3/uL — ABNORMAL LOW (ref 1.4–7.7)
Neutrophils Relative %: 41.6 % — ABNORMAL LOW (ref 43.0–77.0)
RBC: 4.02 Mil/uL (ref 3.87–5.11)
WBC: 2.9 10*3/uL — ABNORMAL LOW (ref 4.5–10.5)

## 2013-05-14 ENCOUNTER — Encounter: Payer: Self-pay | Admitting: *Deleted

## 2013-05-20 ENCOUNTER — Other Ambulatory Visit: Payer: Self-pay | Admitting: *Deleted

## 2013-05-20 ENCOUNTER — Telehealth: Payer: Self-pay | Admitting: Internal Medicine

## 2013-05-20 MED ORDER — TOPIRAMATE 25 MG PO TABS: ORAL_TABLET | ORAL | Status: DC

## 2013-05-20 NOTE — Telephone Encounter (Signed)
Pt already had a refill on file (did not pick up Rx printed on 05/12/13)

## 2013-05-20 NOTE — Telephone Encounter (Signed)
Spoke with pt and clarified Medication name, dose, & directions-Sent Rx to [facility]

## 2013-05-20 NOTE — Telephone Encounter (Signed)
Left message for pt to clarify directions & dose. Pharmacy is not sure about the way she usually gets it because they have different dosing directions on file. They state that her insurance does not cover the requested ER strength.

## 2013-05-20 NOTE — Telephone Encounter (Signed)
Topiramate ER (TROKENDI XR) 25 MG CP24

## 2013-06-08 ENCOUNTER — Encounter: Payer: Self-pay | Admitting: Internal Medicine

## 2013-06-11 ENCOUNTER — Telehealth: Payer: Self-pay | Admitting: Internal Medicine

## 2013-06-11 MED ORDER — ALPRAZOLAM 0.25 MG PO TABS: 0.2500 mg | ORAL_TABLET | Freq: Every day | ORAL | Status: DC | PRN

## 2013-06-11 NOTE — Telephone Encounter (Signed)
Pt states she needs to come in to sign for her prescription refill of xanax (alprazolam).  Please advise pt when this is ready to be picked up.

## 2013-06-11 NOTE — Telephone Encounter (Signed)
Prescription printed for #30 with no refills (xanax).  If she has already given a urine sample (UDS) - no need to repeat.  Thanks.

## 2013-06-11 NOTE — Telephone Encounter (Signed)
Rx faxed & left message on pt voicemail

## 2013-06-11 NOTE — Telephone Encounter (Signed)
Xanax not on med list & UDS has already been completed once. Please advise

## 2013-06-23 DIAGNOSIS — K219 Gastro-esophageal reflux disease without esophagitis: Secondary | ICD-10-CM | POA: Diagnosis not present

## 2013-06-23 DIAGNOSIS — R159 Full incontinence of feces: Secondary | ICD-10-CM | POA: Diagnosis not present

## 2013-06-26 ENCOUNTER — Other Ambulatory Visit: Payer: Self-pay | Admitting: *Deleted

## 2013-06-26 MED ORDER — RABEPRAZOLE SODIUM 20 MG PO TBEC: 20.0000 mg | DELAYED_RELEASE_TABLET | Freq: Every day | ORAL | Status: DC

## 2013-06-26 MED ORDER — DULOXETINE HCL 60 MG PO CPEP: 60.0000 mg | ORAL_CAPSULE | Freq: Every day | ORAL | Status: DC

## 2013-07-01 ENCOUNTER — Encounter: Payer: Self-pay | Admitting: Internal Medicine

## 2013-07-01 ENCOUNTER — Ambulatory Visit (INDEPENDENT_AMBULATORY_CARE_PROVIDER_SITE_OTHER): Payer: Medicare Other | Admitting: Internal Medicine

## 2013-07-01 VITALS — BP 110/70 | HR 67 | Temp 98.2°F | Ht 61.0 in | Wt 122.2 lb

## 2013-07-01 DIAGNOSIS — Z23 Encounter for immunization: Secondary | ICD-10-CM | POA: Diagnosis not present

## 2013-07-01 DIAGNOSIS — K2960 Other gastritis without bleeding: Secondary | ICD-10-CM

## 2013-07-01 DIAGNOSIS — G43909 Migraine, unspecified, not intractable, without status migrainosus: Secondary | ICD-10-CM | POA: Diagnosis not present

## 2013-07-01 DIAGNOSIS — D72819 Decreased white blood cell count, unspecified: Secondary | ICD-10-CM | POA: Diagnosis not present

## 2013-07-05 ENCOUNTER — Other Ambulatory Visit: Payer: Self-pay | Admitting: Internal Medicine

## 2013-07-05 ENCOUNTER — Encounter: Payer: Self-pay | Admitting: Internal Medicine

## 2013-07-05 NOTE — Assessment & Plan Note (Signed)
EGD as outlined.  On carafate and aciphex.  Better.  Follow.    

## 2013-07-05 NOTE — Assessment & Plan Note (Signed)
Follow cbc.  Has been worked up by hematology.  Has been stable.   

## 2013-07-05 NOTE — Assessment & Plan Note (Signed)
Seeing neurology.  Stable.   

## 2013-07-05 NOTE — Progress Notes (Signed)
Subjective:    Patient ID: Brandi Ramos, female    DOB: 05/08/1946, 67 y.o.   MRN: [id number]  HPI 67 year old female with past history of migraine/tension headaches, FCD and diverticulosis who comes in today for a scheduled follow up.  She has been having issues with increased anxiety and stress.  See previous notes for details.  Was instructed she could use the xanax as directed.  She is now taking it q day.   Feels better and is overall doing better. Retired 4/14.  Saw GI.  Had EGD.  Had gastritis.  Had HIDA scan - function ok. On carafate and aciphex bid.  GI symptoms are improved.   Bowels overall stable.  She has had two accidents where stool oozed out without her initially knowing.  She discussed this with GI.  Some decreased sphincter tone.  No back pain.  Bowels otherwise ok.  Feels she is handling stress relatively well.        Past Medical History  Diagnosis Date  . Migraine headache   . Neutropenia     worked up - Dr Doylene Canning, slightly elevated ANA  . PUD (peptic ulcer disease)   . Anxiety   . Fibrocystic breast disease   . Diverticulosis   . GERD (gastroesophageal reflux disease)   . Arthritis   . Chicken pox      Current Outpatient Prescriptions on File Prior to Visit  Medication Sig Dispense Refill  . ALPRAZolam (XANAX) 0.25 MG tablet Take 1 tablet (0.25 mg total) by mouth daily as needed for sleep.  30 tablet  0  . DULoxetine (CYMBALTA) 60 MG capsule Take 1 capsule (60 mg total) by mouth daily.  30 capsule  2  . estradiol (VIVELLE-DOT) 0.0375 MG/24HR Place 1 patch onto the skin as directed.  8 patch  5  . fexofenadine (ALLEGRA) 60 MG tablet Take 60 mg by mouth daily.       . fluticasone (FLONASE) 50 MCG/ACT nasal spray Place 2 sprays into the nose daily.  16 g  1  . propranolol (INDERAL) 40 MG tablet Take 40 mg by mouth daily.       . RABEprazole (ACIPHEX) 20 MG tablet Take 1 tablet (20 mg total) by mouth daily.  30 tablet  5  . rizatriptan (MAXALT) 10 MG tablet Take 10  mg by mouth as needed. May repeat in 2 hours if needed      . topiramate (TOPAMAX) 25 MG tablet Take 6 tablets by mouth daily  180 tablet  3   No current facility-administered medications on file prior to visit.    Review of Systems Patient denies any headache currently.  No lightheadedness or dizziness.  No chest pain, tightness or palpitations.  No increased shortness of breath, cough or congestion.  No nausea or vomiting.  Eating normally now.  Appetite improved.  Epigastric discomfort has improved.  Work up as outlined.   No bowel change, such as diarrhea, constipation, BRBPR or melana.  Does report the two episodes of "oozing" as outlined.  No urine change.   Stress and anxiety improved.  Doing better.  Seeing GI.  Has retired.  Feels she is handling this relatively well.        Objective:   Physical Exam  Filed Vitals:   07/01/13 1003  BP: 110/70  Pulse: 67  Temp: 98.2 F (87.88 C)   68 year old female in no acute distress.   HEENT:  Nares- clear.  Oropharynx - without  lesions. NECK:  Supple.  Nontender.  No audible bruit.  HEART:  Appears to be regular. LUNGS:  No crackles or wheezing audible.  Respirations even and unlabored.  RADIAL PULSE:  Equal bilaterally.  ABDOMEN:  Soft.  Minimal tenderness to palpation over the epigastric region.   Bowel sounds present and normal.  Audible abdominal bruit.   EXTREMITIES:  No increased edema present.  DP pulses palpable and equal bilaterally.           Assessment & Plan:  INCREASED PSYCHOSOCIAL STRESSORS.  Doing better.   Taking xanax dailly.  Will continue xanax for now.  Follow.  On cymbalta.  Follow closely.  BOWEL CHANGE.  Described the "oozing" as outlined.  Add fiber.  Follow.   EPIGASTRIC PAIN.  Saw GI.  Had HIDA scan.  Normal function.  EGD - gastritis.  On carafate and aciphex.  Doing better. Continue to f/u with GI.  No pain now.      CARDIOVASCULAR.  Asymptomatic.    PULMONARY.  Breathing stable.  Follow.    HEALTH  MAINTENANCE.  Physical 09/22/12.  S/p hysterectomy and does not require yearly pap smears.  Colonoscopy 04/26/08 - adenomatous polyp in the cecum, diverticulosis and internal hemorrhoids.  Recommended follow up colonoscopy in five years.  Due f/u.  Discussed with GI.   Bone density normal 06/22/08.

## 2013-07-09 DIAGNOSIS — J069 Acute upper respiratory infection, unspecified: Secondary | ICD-10-CM | POA: Diagnosis not present

## 2013-07-09 DIAGNOSIS — J019 Acute sinusitis, unspecified: Secondary | ICD-10-CM | POA: Diagnosis not present

## 2013-07-13 ENCOUNTER — Telehealth: Payer: Self-pay | Admitting: Internal Medicine

## 2013-07-13 ENCOUNTER — Other Ambulatory Visit: Payer: Self-pay | Admitting: *Deleted

## 2013-07-13 MED ORDER — ALPRAZOLAM 0.25 MG PO TABS: 0.2500 mg | ORAL_TABLET | Freq: Every day | ORAL | Status: DC | PRN

## 2013-07-13 NOTE — Telephone Encounter (Signed)
Sent request to Dr. Lorin Picket for approval

## 2013-07-13 NOTE — Telephone Encounter (Signed)
Okay to refill? 

## 2013-07-13 NOTE — Telephone Encounter (Signed)
Refilled xanax #30 with no refills.   

## 2013-07-13 NOTE — Telephone Encounter (Signed)
Pt is needing refill on Xanax I did let her know to call her pharmacy from now on to get refill request sent. [facility] [place]

## 2013-07-31 ENCOUNTER — Other Ambulatory Visit: Payer: Self-pay | Admitting: Internal Medicine

## 2013-07-31 NOTE — Telephone Encounter (Signed)
Will need to clarify with pt how she is taking the propranolol.  I had down daily not tid.

## 2013-07-31 NOTE — Telephone Encounter (Signed)
Okay to refill? 

## 2013-08-04 ENCOUNTER — Telehealth: Payer: Self-pay | Admitting: Internal Medicine

## 2013-08-04 NOTE — Telephone Encounter (Signed)
Pt calling regarding propranolol.  States pharmacy has sent request several times.  [facility]

## 2013-08-04 NOTE — Telephone Encounter (Signed)
Pt states that she is only taking it once a day. I sent it in with the correct directions to [facility]-pt also mentioned that she will start using mail order in January (?[facility])

## 2013-08-04 NOTE — Telephone Encounter (Signed)
Already contacted pt & sent in RX

## 2013-08-14 ENCOUNTER — Other Ambulatory Visit: Payer: Self-pay | Admitting: *Deleted

## 2013-08-14 NOTE — Telephone Encounter (Signed)
Refill? Last visit 07/01/13

## 2013-08-15 MED ORDER — ALPRAZOLAM 0.25 MG PO TABS: 0.2500 mg | ORAL_TABLET | Freq: Every day | ORAL | Status: DC | PRN

## 2013-08-15 NOTE — Telephone Encounter (Signed)
Refilled xanax #30 with one refill [facility]

## 2013-08-31 ENCOUNTER — Other Ambulatory Visit: Payer: Self-pay | Admitting: Internal Medicine

## 2013-09-21 ENCOUNTER — Telehealth: Payer: Self-pay | Admitting: Internal Medicine

## 2013-09-21 NOTE — Telephone Encounter (Signed)
Pt is almost out of duloxetine.  Was told by insurance for [place] to fax 15 day supply to [facility] of duloxetine 60 mg.  Fax regular prescription of duloxetine to [facility].  Fx [phone].  Vivelle Dot not covered, generic will be covered using estradiol transderm patch 0.0375 mg, 90 day supply will be much cheaper.  Has to be the generic.

## 2013-09-24 ENCOUNTER — Other Ambulatory Visit: Payer: Self-pay | Admitting: *Deleted

## 2013-09-24 MED ORDER — DULOXETINE HCL 60 MG PO CPEP: 60.0000 mg | ORAL_CAPSULE | Freq: Every day | ORAL | Status: DC

## 2013-09-24 NOTE — Telephone Encounter (Signed)
And verify pharmacy (see below)

## 2013-09-24 NOTE — Telephone Encounter (Signed)
Sent mychart message, wanting to verify medications

## 2013-09-24 NOTE — Telephone Encounter (Signed)
Sent pt a Pharmacist, community message to verify pharmacy

## 2013-09-25 MED ORDER — DULOXETINE HCL 60 MG PO CPEP: 60.0000 mg | ORAL_CAPSULE | Freq: Every day | ORAL | Status: DC

## 2013-09-25 NOTE — Telephone Encounter (Signed)
The name of the pharmacy is [facility]. [phone]  The patient stated she is needing her prescription faxed today.

## 2013-09-26 ENCOUNTER — Other Ambulatory Visit: Payer: Self-pay | Admitting: *Deleted

## 2013-09-26 MED ORDER — ESTRADIOL 0.0375 MG/24HR TD PTWK: 0.0375 mg | MEDICATED_PATCH | TRANSDERMAL | Status: DC

## 2013-09-26 NOTE — Telephone Encounter (Signed)
Rx sent to [facility] & pt notified via mychart

## 2013-09-28 ENCOUNTER — Encounter: Payer: Self-pay | Admitting: Internal Medicine

## 2013-09-29 DIAGNOSIS — Z86010 Personal history of colonic polyps: Secondary | ICD-10-CM | POA: Diagnosis not present

## 2013-09-29 DIAGNOSIS — R1013 Epigastric pain: Secondary | ICD-10-CM | POA: Diagnosis not present

## 2013-09-29 DIAGNOSIS — R1032 Left lower quadrant pain: Secondary | ICD-10-CM | POA: Diagnosis not present

## 2013-09-29 DIAGNOSIS — R1031 Right lower quadrant pain: Secondary | ICD-10-CM | POA: Diagnosis not present

## 2013-10-07 ENCOUNTER — Other Ambulatory Visit: Payer: Self-pay | Admitting: *Deleted

## 2013-10-07 ENCOUNTER — Encounter: Payer: Self-pay | Admitting: Internal Medicine

## 2013-10-07 MED ORDER — TOPIRAMATE 25 MG PO TABS: ORAL_TABLET | ORAL | Status: DC

## 2013-10-12 ENCOUNTER — Other Ambulatory Visit: Payer: Self-pay | Admitting: Internal Medicine

## 2013-10-12 NOTE — Telephone Encounter (Signed)
Refilled xanax #30 with one refill.

## 2013-10-12 NOTE — Telephone Encounter (Signed)
Rx faxed to pharmacy  

## 2013-10-12 NOTE — Telephone Encounter (Signed)
Ok refill? 

## 2013-10-13 ENCOUNTER — Ambulatory Visit: Payer: Self-pay | Admitting: Gastroenterology

## 2013-10-13 DIAGNOSIS — R1084 Generalized abdominal pain: Secondary | ICD-10-CM | POA: Diagnosis not present

## 2013-10-13 DIAGNOSIS — Z860100 Personal history of colon polyps, unspecified: Secondary | ICD-10-CM | POA: Diagnosis not present

## 2013-10-13 DIAGNOSIS — Q438 Other specified congenital malformations of intestine: Secondary | ICD-10-CM | POA: Diagnosis not present

## 2013-10-13 DIAGNOSIS — Z09 Encounter for follow-up examination after completed treatment for conditions other than malignant neoplasm: Secondary | ICD-10-CM | POA: Diagnosis not present

## 2013-10-13 DIAGNOSIS — R109 Unspecified abdominal pain: Secondary | ICD-10-CM | POA: Diagnosis not present

## 2013-10-13 DIAGNOSIS — F411 Generalized anxiety disorder: Secondary | ICD-10-CM | POA: Diagnosis not present

## 2013-10-13 DIAGNOSIS — Z882 Allergy status to sulfonamides status: Secondary | ICD-10-CM | POA: Diagnosis not present

## 2013-10-13 DIAGNOSIS — K219 Gastro-esophageal reflux disease without esophagitis: Secondary | ICD-10-CM | POA: Diagnosis not present

## 2013-10-13 DIAGNOSIS — Z79899 Other long term (current) drug therapy: Secondary | ICD-10-CM | POA: Diagnosis not present

## 2013-10-13 DIAGNOSIS — D649 Anemia, unspecified: Secondary | ICD-10-CM | POA: Diagnosis not present

## 2013-10-13 DIAGNOSIS — K648 Other hemorrhoids: Secondary | ICD-10-CM | POA: Diagnosis not present

## 2013-10-13 LAB — HM COLONOSCOPY: HM Colonoscopy: NORMAL

## 2013-10-19 ENCOUNTER — Encounter: Payer: Self-pay | Admitting: Internal Medicine

## 2013-10-19 ENCOUNTER — Ambulatory Visit (INDEPENDENT_AMBULATORY_CARE_PROVIDER_SITE_OTHER): Payer: Medicare Other | Admitting: Internal Medicine

## 2013-10-19 VITALS — BP 110/60 | HR 77 | Temp 98.4°F | Ht 63.25 in | Wt 124.8 lb

## 2013-10-19 DIAGNOSIS — Z13220 Encounter for screening for lipoid disorders: Secondary | ICD-10-CM

## 2013-10-19 DIAGNOSIS — E7800 Pure hypercholesterolemia, unspecified: Secondary | ICD-10-CM

## 2013-10-19 DIAGNOSIS — R5383 Other fatigue: Secondary | ICD-10-CM

## 2013-10-19 DIAGNOSIS — D72819 Decreased white blood cell count, unspecified: Secondary | ICD-10-CM | POA: Diagnosis not present

## 2013-10-19 DIAGNOSIS — Z1239 Encounter for other screening for malignant neoplasm of breast: Secondary | ICD-10-CM

## 2013-10-19 DIAGNOSIS — K2960 Other gastritis without bleeding: Secondary | ICD-10-CM

## 2013-10-19 DIAGNOSIS — D649 Anemia, unspecified: Secondary | ICD-10-CM

## 2013-10-19 DIAGNOSIS — Z79899 Other long term (current) drug therapy: Secondary | ICD-10-CM

## 2013-10-19 DIAGNOSIS — R5381 Other malaise: Secondary | ICD-10-CM

## 2013-10-19 DIAGNOSIS — G43909 Migraine, unspecified, not intractable, without status migrainosus: Secondary | ICD-10-CM

## 2013-10-19 MED ORDER — PROPRANOLOL HCL 40 MG PO TABS: 40.0000 mg | ORAL_TABLET | Freq: Every day | ORAL | Status: DC

## 2013-10-19 MED ORDER — FLUTICASONE PROPIONATE 50 MCG/ACT NA SUSP: 2.0000 | Freq: Every day | NASAL | Status: DC

## 2013-10-19 MED ORDER — RABEPRAZOLE SODIUM 20 MG PO TBEC: 20.0000 mg | DELAYED_RELEASE_TABLET | Freq: Every day | ORAL | Status: DC

## 2013-10-19 MED ORDER — ALPRAZOLAM 0.25 MG PO TABS: 0.2500 mg | ORAL_TABLET | Freq: Every day | ORAL | Status: DC | PRN

## 2013-10-19 NOTE — Assessment & Plan Note (Addendum)
Seeing neurology.  Stable.   

## 2013-10-19 NOTE — Progress Notes (Signed)
Subjective:    Patient ID: Brandi Ramos, female    DOB: 18-May-1946, 68 y.o.   MRN: [id number]  HPI 68 year old female with past history of migraine/tension headaches, FCD and diverticulosis who comes in today to follow up on these issues as well as for a complete physical exam.  She has been having issues with increased anxiety and stress.  See previous notes for details.  Was instructed she could use the xanax as directed.  She is now taking it q day.   Feels better and is overall doing better. Retired 4/14.  Saw GI.  Had EGD.  Had gastritis.  Had HIDA scan - function ok. On aciphex bid.  Was placed back on carafate last week.  GI symptoms are improved.   Bowels overall stable.  She had a colonoscopy last week.  Has f/u with GI later this month.   No back pain.  Feels she is handling stress relatively well.        Past Medical History  Diagnosis Date  . Migraine headache   . Neutropenia     worked up - Dr Oliva Bustard, slightly elevated ANA  . PUD (peptic ulcer disease)   . Anxiety   . Fibrocystic breast disease   . Diverticulosis   . GERD (gastroesophageal reflux disease)   . Arthritis   . Chicken pox      Current Outpatient Prescriptions on File Prior to Visit  Medication Sig Dispense Refill  . ALPRAZolam (XANAX) 0.25 MG tablet TAKE 1 TABLET BY MOUTH EVERY DAY AS NEEDED FOR SLEEP  30 tablet  1  . DULoxetine (CYMBALTA) 60 MG capsule Take 1 capsule (60 mg total) by mouth daily.  90 capsule  1  . estradiol (CLIMARA - DOSED IN MG/24 HR) 0.0375 mg/24hr patch Place 1 patch (0.0375 mg total) onto the skin once a week.  12 patch  2  . fexofenadine (ALLEGRA) 60 MG tablet Take 60 mg by mouth daily.       . fluticasone (FLONASE) 50 MCG/ACT nasal spray Place 2 sprays into the nose daily.  16 g  1  . propranolol (INDERAL) 40 MG tablet Take 1 tablet (40 mg total) by mouth daily.  30 tablet  2  . RABEprazole (ACIPHEX) 20 MG tablet TAKE 1 TABLET (20 MG TOTAL) BY MOUTH DAILY.  30 tablet  5  .  rizatriptan (MAXALT) 10 MG tablet Take 10 mg by mouth as needed. May repeat in 2 hours if needed       No current facility-administered medications on file prior to visit.    Review of Systems Patient denies any headache currently.  Still some intermittent headaches.  Trying to taper her estrogen.  No lightheadedness or dizziness.  No chest pain, tightness or palpitations.  No increased shortness of breath, cough or congestion.  No nausea or vomiting.  Eating normally now.  Appetite improved.  Epigastric discomfort has improved.  Work up as outlined.   No bowel change, such as diarrhea, constipation, BRBPR or melana.  No urine change.   Stress and anxiety improved.  Doing better.  Seeing GI.  Has retired.  Feels she is handling this relatively well.        Objective:   Physical Exam  Filed Vitals:   10/19/13 1341  BP: 110/60  Pulse: 77  Temp: 98.4 F (64.10 C)   68 year old female in no acute distress.   HEENT:  Nares- clear.  Oropharynx - without lesions. NECK:  Supple.  Nontender.  No audible bruit.  HEART:  Appears to be regular. LUNGS:  No crackles or wheezing audible.  Respirations even and unlabored.  RADIAL PULSE:  Equal bilaterally.    BREASTS:  No nipple discharge or nipple retraction present.  Could not appreciate any distinct nodules or axillary adenopathy.  ABDOMEN:  Soft, nontender.  Bowel sounds present and normal.  No audible abdominal bruit.  GU:  Not performed.     EXTREMITIES:  No increased edema present.  DP pulses palpable and equal bilaterally.          Assessment & Plan:  INCREASED PSYCHOSOCIAL STRESSORS.  Doing better.   Taking xanax dailly.  Will continue xanax for now.  Follow.  On cymbalta.  Follow closely.  EPIGASTRIC PAIN.  Saw GI.  Had HIDA scan.  Normal function.  EGD - gastritis.  On carafate and aciphex.  Doing better. Continue to f/u with GI.  No pain now.      CARDIOVASCULAR.  Asymptomatic.    PULMONARY.  Breathing stable.  Follow.    HEALTH  MAINTENANCE.  Physical today.   S/p hysterectomy and does not require yearly pap smears.  Colonoscopy 04/26/08 - adenomatous polyp in the cecum, diverticulosis and internal hemorrhoids.  Recommended follow up colonoscopy in five years.  Just had colonoscopy last week.  She reports checked out fine.  Obtain official report.  Bone density normal 06/22/08.

## 2013-10-19 NOTE — Assessment & Plan Note (Addendum)
Follow cbc.  Has been worked up by hematology.  Has been stable.

## 2013-10-19 NOTE — Progress Notes (Signed)
Pre-visit discussion using our clinic review tool. No additional management support is needed unless otherwise documented below in the visit note.  

## 2013-10-19 NOTE — Assessment & Plan Note (Addendum)
EGD as outlined.  On carafate and aciphex.  Better.  Follow.

## 2013-10-21 ENCOUNTER — Encounter: Payer: Self-pay | Admitting: Emergency Medicine

## 2013-10-21 ENCOUNTER — Encounter: Payer: Self-pay | Admitting: Internal Medicine

## 2013-10-25 ENCOUNTER — Encounter: Payer: Self-pay | Admitting: Internal Medicine

## 2013-10-25 DIAGNOSIS — Z79899 Other long term (current) drug therapy: Secondary | ICD-10-CM | POA: Insufficient documentation

## 2013-10-25 NOTE — Assessment & Plan Note (Signed)
Trying to taper her off.  Follow.

## 2013-10-26 ENCOUNTER — Other Ambulatory Visit (INDEPENDENT_AMBULATORY_CARE_PROVIDER_SITE_OTHER): Payer: Medicare Other

## 2013-10-26 DIAGNOSIS — K2960 Other gastritis without bleeding: Secondary | ICD-10-CM | POA: Diagnosis not present

## 2013-10-26 DIAGNOSIS — R5381 Other malaise: Secondary | ICD-10-CM | POA: Diagnosis not present

## 2013-10-26 DIAGNOSIS — R5383 Other fatigue: Secondary | ICD-10-CM | POA: Diagnosis not present

## 2013-10-26 DIAGNOSIS — D72819 Decreased white blood cell count, unspecified: Secondary | ICD-10-CM

## 2013-10-26 DIAGNOSIS — E7800 Pure hypercholesterolemia, unspecified: Secondary | ICD-10-CM

## 2013-10-26 LAB — LIPID PANEL
Cholesterol: 173 mg/dL (ref 0–200)
HDL: 46.2 mg/dL (ref >39.00)
LDL Cholesterol: 108 mg/dL — ABNORMAL HIGH (ref 0–99)
Total CHOL/HDL Ratio: 4
Triglycerides: 96 mg/dL (ref 0.0–149.0)
VLDL: 19.2 mg/dL (ref 0.0–40.0)

## 2013-10-26 LAB — CBC WITH DIFFERENTIAL/PLATELET
BASOS ABS: 0 10*3/uL (ref 0.0–0.1)
Basophils Relative: 0.4 % (ref 0.0–3.0)
Eosinophils Absolute: 0.1 10*3/uL (ref 0.0–0.7)
Eosinophils Relative: 2.6 % (ref 0.0–5.0)
HCT: 38 % (ref 36.0–46.0)
Hemoglobin: 12.6 g/dL (ref 12.0–15.0)
LYMPHS PCT: 47 % — AB (ref 12.0–46.0)
Lymphs Abs: 1.6 10*3/uL (ref 0.7–4.0)
MCHC: 33.1 g/dL (ref 30.0–36.0)
MCV: 88.5 fl (ref 78.0–100.0)
MONOS PCT: 9.6 % (ref 3.0–12.0)
Monocytes Absolute: 0.3 10*3/uL (ref 0.1–1.0)
NEUTROS ABS: 1.4 10*3/uL (ref 1.4–7.7)
NEUTROS PCT: 40.4 % — AB (ref 43.0–77.0)
PLATELETS: 176 10*3/uL (ref 150.0–400.0)
RBC: 4.3 Mil/uL (ref 3.87–5.11)
RDW: 13.7 % (ref 11.5–14.6)
WBC: 3.4 10*3/uL — ABNORMAL LOW (ref 4.5–10.5)

## 2013-10-26 LAB — COMPREHENSIVE METABOLIC PANEL
ALT: 12 U/L (ref 0–35)
AST: 22 U/L (ref 0–37)
Albumin: 3.9 g/dL (ref 3.5–5.2)
Alkaline Phosphatase: 56 U/L (ref 39–117)
BILIRUBIN TOTAL: 0.6 mg/dL (ref 0.3–1.2)
BUN: 16 mg/dL (ref 6–23)
CO2: 27 meq/L (ref 19–32)
CREATININE: 0.7 mg/dL (ref 0.4–1.2)
Calcium: 9.1 mg/dL (ref 8.4–10.5)
Chloride: 107 mEq/L (ref 96–112)
GFR: 86.32 mL/min (ref >60.00)
GLUCOSE: 97 mg/dL (ref 70–99)
Potassium: 4 mEq/L (ref 3.5–5.1)
Sodium: 141 mEq/L (ref 135–145)
TOTAL PROTEIN: 7.2 g/dL (ref 6.0–8.3)

## 2013-10-26 LAB — TSH: TSH: 1.53 u[IU]/mL (ref 0.35–5.50)

## 2013-10-27 ENCOUNTER — Other Ambulatory Visit: Payer: Medicare Other

## 2013-10-27 ENCOUNTER — Encounter: Payer: Self-pay | Admitting: Internal Medicine

## 2013-11-08 ENCOUNTER — Encounter: Payer: Self-pay | Admitting: Internal Medicine

## 2013-11-11 ENCOUNTER — Ambulatory Visit: Payer: Self-pay | Admitting: Internal Medicine

## 2013-11-11 DIAGNOSIS — R928 Other abnormal and inconclusive findings on diagnostic imaging of breast: Secondary | ICD-10-CM | POA: Diagnosis not present

## 2013-11-11 DIAGNOSIS — Z1231 Encounter for screening mammogram for malignant neoplasm of breast: Secondary | ICD-10-CM | POA: Diagnosis not present

## 2013-11-11 LAB — HM MAMMOGRAPHY

## 2013-11-14 ENCOUNTER — Encounter: Payer: Self-pay | Admitting: Internal Medicine

## 2013-11-20 DIAGNOSIS — K3189 Other diseases of stomach and duodenum: Secondary | ICD-10-CM | POA: Diagnosis not present

## 2013-11-20 DIAGNOSIS — K5900 Constipation, unspecified: Secondary | ICD-10-CM | POA: Diagnosis not present

## 2013-11-26 ENCOUNTER — Ambulatory Visit: Payer: Self-pay | Admitting: Internal Medicine

## 2013-11-26 DIAGNOSIS — R922 Inconclusive mammogram: Secondary | ICD-10-CM | POA: Diagnosis not present

## 2013-11-26 DIAGNOSIS — N63 Unspecified lump in unspecified breast: Secondary | ICD-10-CM | POA: Diagnosis not present

## 2014-01-01 DIAGNOSIS — K5732 Diverticulitis of large intestine without perforation or abscess without bleeding: Secondary | ICD-10-CM | POA: Diagnosis not present

## 2014-01-11 ENCOUNTER — Other Ambulatory Visit: Payer: Self-pay | Admitting: Internal Medicine

## 2014-01-11 NOTE — Telephone Encounter (Signed)
Rx faxed to pharmacy  

## 2014-01-11 NOTE — Telephone Encounter (Signed)
Refilled xanax #30 with no refills.  rx on your desk.

## 2014-01-11 NOTE — Telephone Encounter (Signed)
Last visit 10-19-13, Last RF 10-19-13 #30, ok refill?

## 2014-01-21 ENCOUNTER — Encounter: Payer: Self-pay | Admitting: Internal Medicine

## 2014-01-21 ENCOUNTER — Ambulatory Visit (INDEPENDENT_AMBULATORY_CARE_PROVIDER_SITE_OTHER): Payer: Medicare Other | Admitting: Internal Medicine

## 2014-01-21 VITALS — BP 110/70 | HR 71 | Temp 98.2°F | Ht 63.25 in | Wt 121.0 lb

## 2014-01-21 DIAGNOSIS — R5383 Other fatigue: Secondary | ICD-10-CM | POA: Diagnosis not present

## 2014-01-21 DIAGNOSIS — R0989 Other specified symptoms and signs involving the circulatory and respiratory systems: Secondary | ICD-10-CM

## 2014-01-21 DIAGNOSIS — K2960 Other gastritis without bleeding: Secondary | ICD-10-CM

## 2014-01-21 DIAGNOSIS — D72819 Decreased white blood cell count, unspecified: Secondary | ICD-10-CM

## 2014-01-21 DIAGNOSIS — R5381 Other malaise: Secondary | ICD-10-CM

## 2014-01-21 DIAGNOSIS — R0609 Other forms of dyspnea: Secondary | ICD-10-CM

## 2014-01-21 DIAGNOSIS — Z79899 Other long term (current) drug therapy: Secondary | ICD-10-CM

## 2014-01-21 DIAGNOSIS — G43909 Migraine, unspecified, not intractable, without status migrainosus: Secondary | ICD-10-CM | POA: Diagnosis not present

## 2014-01-21 LAB — COMPREHENSIVE METABOLIC PANEL
ALK PHOS: 51 U/L (ref 39–117)
ALT: 14 U/L (ref 0–35)
AST: 24 U/L (ref 0–37)
Albumin: 3.8 g/dL (ref 3.5–5.2)
BILIRUBIN TOTAL: 0.6 mg/dL (ref 0.2–1.2)
BUN: 19 mg/dL (ref 6–23)
CO2: 29 meq/L (ref 19–32)
Calcium: 9.4 mg/dL (ref 8.4–10.5)
Chloride: 107 mEq/L (ref 96–112)
Creatinine, Ser: 0.7 mg/dL (ref 0.4–1.2)
GFR: 90.6 mL/min (ref >60.00)
GLUCOSE: 85 mg/dL (ref 70–99)
Potassium: 3.6 mEq/L (ref 3.5–5.1)
Sodium: 139 mEq/L (ref 135–145)
Total Protein: 6.2 g/dL (ref 6.0–8.3)

## 2014-01-21 LAB — CBC WITH DIFFERENTIAL/PLATELET
BASOS ABS: 0 10*3/uL (ref 0.0–0.1)
Basophils Relative: 0.4 % (ref 0.0–3.0)
EOS PCT: 2.4 % (ref 0.0–5.0)
Eosinophils Absolute: 0.1 10*3/uL (ref 0.0–0.7)
HCT: 34.5 % — ABNORMAL LOW (ref 36.0–46.0)
Hemoglobin: 11.6 g/dL — ABNORMAL LOW (ref 12.0–15.0)
LYMPHS ABS: 1.5 10*3/uL (ref 0.7–4.0)
Lymphocytes Relative: 39.6 % (ref 12.0–46.0)
MCHC: 33.5 g/dL (ref 30.0–36.0)
MCV: 87.7 fl (ref 78.0–100.0)
Monocytes Absolute: 0.4 10*3/uL (ref 0.1–1.0)
Monocytes Relative: 9.1 % (ref 3.0–12.0)
Neutro Abs: 1.9 10*3/uL (ref 1.4–7.7)
Neutrophils Relative %: 48.5 % (ref 43.0–77.0)
Platelets: 149 10*3/uL — ABNORMAL LOW (ref 150.0–400.0)
RBC: 3.94 Mil/uL (ref 3.87–5.11)
RDW: 13.8 % (ref 11.5–15.5)
WBC: 3.9 10*3/uL — ABNORMAL LOW (ref 4.0–10.5)

## 2014-01-21 NOTE — Progress Notes (Signed)
Pre visit review using our clinic review tool, if applicable. No additional management support is needed unless otherwise documented below in the visit note. 

## 2014-01-25 ENCOUNTER — Encounter: Payer: Self-pay | Admitting: Internal Medicine

## 2014-01-31 ENCOUNTER — Encounter: Payer: Self-pay | Admitting: Internal Medicine

## 2014-01-31 DIAGNOSIS — R5383 Other fatigue: Secondary | ICD-10-CM | POA: Insufficient documentation

## 2014-01-31 NOTE — Progress Notes (Signed)
Subjective:    Patient ID: Brandi Ramos, female    DOB: 1945/12/04, 68 y.o.   MRN: [id number]  HPI 68 year old female with past history of migraine/tension headaches, FCD and diverticulosis who comes in today for a scheduled follow up.  She had been having issues with increased anxiety and stress.  See previous notes for details.  She is now feeling better and is overall doing better. Retired 4/14.  Saw GI.  Had EGD.  Had gastritis.  Had HIDA scan - function ok. On aciphex bid.  Was placed back on carafate last week.  GI symptoms are improved.   She has noticed some allergy symptoms and some increased hoarseness.  On claritin.  Discussed ENT referral secondary to persistence.  She declines now.  Will follow.  Some increased fatigue.          Past Medical History  Diagnosis Date  . Migraine headache   . Neutropenia     worked up - Dr Oliva Bustard, slightly elevated ANA  . PUD (peptic ulcer disease)   . Anxiety   . Fibrocystic breast disease   . Diverticulosis   . GERD (gastroesophageal reflux disease)   . Arthritis   . Chicken pox      Current Outpatient Prescriptions on File Prior to Visit  Medication Sig Dispense Refill  . ALPRAZolam (XANAX) 0.25 MG tablet TAKE 1 TABLET BY MOUTH DAILY AS NEEDED FOR ANXIETY  30 tablet  0  . DULoxetine (CYMBALTA) 60 MG capsule Take 1 capsule (60 mg total) by mouth daily.  90 capsule  1  . estradiol (CLIMARA - DOSED IN MG/24 HR) 0.0375 mg/24hr patch Place 1 patch (0.0375 mg total) onto the skin once a week.  12 patch  2  . fexofenadine (ALLEGRA) 60 MG tablet Take 60 mg by mouth daily.       . fluticasone (FLONASE) 50 MCG/ACT nasal spray Place 2 sprays into both nostrils daily.  16 g  3  . propranolol (INDERAL) 40 MG tablet Take 1 tablet (40 mg total) by mouth daily.  90 tablet  3  . RABEprazole (ACIPHEX) 20 MG tablet Take 1 tablet (20 mg total) by mouth daily.  90 tablet  3  . rizatriptan (MAXALT) 10 MG tablet Take 10 mg by mouth as needed. May repeat in 2  hours if needed      . topiramate (TOPAMAX) 25 MG tablet Take 5 tablets by mouth daily       No current facility-administered medications on file prior to visit.    Review of Systems Patient denies any headache currently.  No lightheadedness or dizziness.  Some hoarseness.  On claritin.  No chest pain, tightness or palpitations.  No increased shortness of breath, cough or congestion.  No nausea or vomiting.  Eating normally.   Appetite improved.  Epigastric discomfort has improved.  Work up as outlined.   No bowel change, such as diarrhea, constipation, BRBPR or melana.  No urine change.   Stress and anxiety improved.  Doing better.  Seeing GI.  Has retired.  Feels she is handling stress relatively well.  Increased fatigue.         Objective:   Physical Exam  Filed Vitals:   01/21/14 1403  BP: 110/70  Pulse: 71  Temp: 98.2 F (3.77 C)   68 year old female in no acute distress.   HEENT:  Nares- clear.  Oropharynx - without lesions. NECK:  Supple.  Nontender.  No audible bruit.  HEART:  Appears to be regular. LUNGS:  No crackles or wheezing audible.  Respirations even and unlabored.  RADIAL PULSE:  Equal bilaterally.  ABDOMEN:  Soft, nontender.  Bowel sounds present and normal.  No audible abdominal bruit.    EXTREMITIES:  No increased edema present.  DP pulses palpable and equal bilaterally.          Assessment & Plan:  INCREASED PSYCHOSOCIAL STRESSORS.  Doing better.  On cymbalta.  Has xanax if needed.  Follow closely.  EPIGASTRIC PAIN.  Saw GI.  Had HIDA scan.  Normal function.  EGD - gastritis.  On carafate and aciphex.  Doing better. Continue to f/u with GI.  No pain now.    PULMONARY.  Breathing stable.  Follow.    HEALTH MAINTENANCE.  Physical 10/19/13..   S/p hysterectomy and does not require yearly pap smears.  Colonoscopy 04/26/08 - adenomatous polyp in the cecum, diverticulosis and internal hemorrhoids.  Recommended follow up colonoscopy in five years.  Last colonoscopy  10/13/13 - tortuous colon.  Recommended f/u colonoscopy in five years.  Bone density normal 06/22/08.    I spent 25 minutes with the patient and more than 50% of the time was spent in consultation regarding the above.

## 2014-01-31 NOTE — Assessment & Plan Note (Signed)
Trying to taper her off.  Follow.

## 2014-01-31 NOTE — Assessment & Plan Note (Signed)
Follow cbc.  Has been worked up by hematology.  Has been stable.

## 2014-01-31 NOTE — Assessment & Plan Note (Signed)
Seeing neurology.  Stable.   

## 2014-01-31 NOTE — Assessment & Plan Note (Signed)
EGD as outlined.  On carafate and aciphex.  Better.  Follow.

## 2014-01-31 NOTE — Assessment & Plan Note (Signed)
Increased fatigue.  Discussed at length with her today.  EKG obtained and revealed SR with no acute ischemic changes.  Will follow.  Hold on further w/up at this time.  Recheck cbc and metabolic panel.

## 2014-02-09 ENCOUNTER — Encounter: Payer: Self-pay | Admitting: Internal Medicine

## 2014-02-10 ENCOUNTER — Other Ambulatory Visit: Payer: Self-pay | Admitting: Internal Medicine

## 2014-02-10 NOTE — Telephone Encounter (Signed)
Refilled xanax #30 with no refills.

## 2014-02-10 NOTE — Telephone Encounter (Signed)
Last visit 01/11/14, refill?

## 2014-02-11 NOTE — Telephone Encounter (Signed)
Rx faxed to pharmacy  

## 2014-02-16 ENCOUNTER — Encounter: Payer: Self-pay | Admitting: Internal Medicine

## 2014-03-11 ENCOUNTER — Other Ambulatory Visit: Payer: Self-pay | Admitting: Internal Medicine

## 2014-03-11 NOTE — Telephone Encounter (Signed)
rx faxed

## 2014-03-11 NOTE — Telephone Encounter (Signed)
Last refill 6.4.15, last OV 5.14.15, future OV 8.18.15.  Please advise refill.

## 2014-03-11 NOTE — Telephone Encounter (Signed)
Refilled xanax #30 with no refills.  Signed and placed on your desk.

## 2014-03-29 ENCOUNTER — Other Ambulatory Visit: Payer: Self-pay | Admitting: Internal Medicine

## 2014-03-29 NOTE — Telephone Encounter (Signed)
Refilled cymbalta #90 with one refill.

## 2014-03-29 NOTE — Telephone Encounter (Signed)
Last refill 4.10.15, last OV 5.14.15 next OV 8.18.15.  please advise refill

## 2014-04-08 DIAGNOSIS — G43019 Migraine without aura, intractable, without status migrainosus: Secondary | ICD-10-CM | POA: Diagnosis not present

## 2014-04-08 DIAGNOSIS — G4720 Circadian rhythm sleep disorder, unspecified type: Secondary | ICD-10-CM | POA: Diagnosis not present

## 2014-04-08 DIAGNOSIS — G4700 Insomnia, unspecified: Secondary | ICD-10-CM | POA: Diagnosis not present

## 2014-04-12 ENCOUNTER — Other Ambulatory Visit: Payer: Self-pay | Admitting: Internal Medicine

## 2014-04-12 NOTE — Telephone Encounter (Signed)
rx faxed to [phone].

## 2014-04-12 NOTE — Telephone Encounter (Signed)
Last refill 7.2.15, last OV 5.14.15, next OV 10.6.15.  Please advise refill.

## 2014-04-12 NOTE — Telephone Encounter (Signed)
Refilled xanax #30 with no refills.  rx signed and on your desk.

## 2014-04-19 ENCOUNTER — Telehealth: Payer: Self-pay | Admitting: *Deleted

## 2014-04-19 DIAGNOSIS — Z1283 Encounter for screening for malignant neoplasm of skin: Secondary | ICD-10-CM | POA: Diagnosis not present

## 2014-04-19 DIAGNOSIS — Z85828 Personal history of other malignant neoplasm of skin: Secondary | ICD-10-CM | POA: Diagnosis not present

## 2014-04-19 DIAGNOSIS — D485 Neoplasm of uncertain behavior of skin: Secondary | ICD-10-CM | POA: Diagnosis not present

## 2014-04-19 DIAGNOSIS — L819 Disorder of pigmentation, unspecified: Secondary | ICD-10-CM | POA: Diagnosis not present

## 2014-04-19 DIAGNOSIS — L821 Other seborrheic keratosis: Secondary | ICD-10-CM | POA: Diagnosis not present

## 2014-04-19 DIAGNOSIS — L570 Actinic keratosis: Secondary | ICD-10-CM | POA: Diagnosis not present

## 2014-04-19 NOTE — Telephone Encounter (Signed)
Pt called states she is having panic attacks due to increased family issues.  Pt requests to increase her Xanax to 2-3 tabs daily.  Please advise

## 2014-04-19 NOTE — Telephone Encounter (Signed)
Spoke with pt advised of MDs message.  Pt verbalized understanding. 

## 2014-04-19 NOTE — Telephone Encounter (Signed)
If she is having increased anxiety and panic attacks (on Cymbalta and xanax), then I would like to get her set up with psychiatry for further evaluation.  She can temporarily go up to bid prn dosing of the xanax, but I do not want to go to multiple doses per day.  May need to change medication and I need some help from psychiatry to know what medication would be best in this situation.  Let me know if any problems.

## 2014-04-23 ENCOUNTER — Ambulatory Visit (INDEPENDENT_AMBULATORY_CARE_PROVIDER_SITE_OTHER): Payer: Medicare Other | Admitting: Internal Medicine

## 2014-04-23 ENCOUNTER — Encounter: Payer: Self-pay | Admitting: Internal Medicine

## 2014-04-23 VITALS — BP 100/70 | HR 69 | Temp 98.2°F | Ht 63.25 in | Wt 116.5 lb

## 2014-04-23 DIAGNOSIS — F419 Anxiety disorder, unspecified: Secondary | ICD-10-CM

## 2014-04-23 DIAGNOSIS — F411 Generalized anxiety disorder: Secondary | ICD-10-CM

## 2014-04-23 MED ORDER — ALPRAZOLAM 0.25 MG PO TABS: 0.2500 mg | ORAL_TABLET | Freq: Two times a day (BID) | ORAL | Status: DC | PRN

## 2014-04-23 NOTE — Progress Notes (Signed)
Pre visit review using our clinic review tool, if applicable. No additional management support is needed unless otherwise documented below in the visit note. 

## 2014-04-25 ENCOUNTER — Encounter: Payer: Self-pay | Admitting: Internal Medicine

## 2014-04-25 DIAGNOSIS — F419 Anxiety disorder, unspecified: Secondary | ICD-10-CM | POA: Insufficient documentation

## 2014-04-25 NOTE — Progress Notes (Signed)
Subjective:    Patient ID: Brandi Ramos, female    DOB: [birth year], 67 y.o.   MRN: [id number]  HPI 68 year old female with past history of migraine/tension headaches, FCD and diverticulosis who comes in today as a work in with concerns regarding increased anxiety and panic attacks.   She had been having issues with increased anxiety and stress previously.  This had been better until recently.  She is in a new relationship.  Increased stress with this and some family issues.  Recently increased her xanax dose to bid.  This has helped.  Since doing that, she has not had another panic attack.  Overall feels some better.  Family is supportive.  No depression.            Past Medical History  Diagnosis Date  . Migraine headache   . Neutropenia     worked up - Dr Oliva Bustard, slightly elevated ANA  . PUD (peptic ulcer disease)   . Anxiety   . Fibrocystic breast disease   . Diverticulosis   . GERD (gastroesophageal reflux disease)   . Arthritis   . Chicken pox      Current Outpatient Prescriptions on File Prior to Visit  Medication Sig Dispense Refill  . DULoxetine (CYMBALTA) 60 MG capsule TAKE 1 CAPSULE DAILY  90 capsule  1  . estradiol (CLIMARA - DOSED IN MG/24 HR) 0.0375 mg/24hr patch Place 1 patch (0.0375 mg total) onto the skin once a week.  12 patch  2  . fexofenadine (ALLEGRA) 60 MG tablet Take 60 mg by mouth daily.       . fluticasone (FLONASE) 50 MCG/ACT nasal spray Place 2 sprays into both nostrils daily.  16 g  3  . propranolol (INDERAL) 40 MG tablet Take 1 tablet (40 mg total) by mouth daily.  90 tablet  3  . RABEprazole (ACIPHEX) 20 MG tablet Take 1 tablet (20 mg total) by mouth daily.  90 tablet  3  . rizatriptan (MAXALT) 10 MG tablet Take 10 mg by mouth as needed. May repeat in 2 hours if needed      . topiramate (TOPAMAX) 25 MG tablet Take 5 tablets by mouth daily       No current facility-administered medications on file prior to visit.    Review of Systems increased  anxiety and stress as outlined.  Doing better since increasing the xanax to bid.  Previously having some panic attacks.  She had lost some weight.  Appetite is better now.  Eating better.  No nausea or vomiting.  No bowel change.  Discussed counseling.  Discussed psych referral.  She does not feel she needs this at this point.  No depression.          Objective:   Physical Exam  Filed Vitals:   04/23/14 0919  BP: 100/70  Pulse: 69  Temp: 98.2 F (99.65 C)   68 year old female in no acute distress.   Did not perform physical exam given nature of this visit.          Assessment & Plan:  HEALTH MAINTENANCE.  Physical 10/19/13..   S/p hysterectomy and does not require yearly pap smears.  Colonoscopy 04/26/08 - adenomatous polyp in the cecum, diverticulosis and internal hemorrhoids.  Recommended follow up colonoscopy in five years.  Last colonoscopy 10/13/13 - tortuous colon.  Recommended f/u colonoscopy in five years.  Bone density normal 06/22/08.    I spent 25 minutes with the patient and more  than 50% of the time was spent in consultation regarding the above, specifically we discussed her anxiety, her current relationship, her panic attacks and medication recommendations.

## 2014-04-25 NOTE — Assessment & Plan Note (Signed)
Has had issues with anxiety previously.  Increased recently.  With panic attacks now.  On cymbalta.  Also has xanax.  Recently started taking the xanax bid.  We discussed this at length.  Discussed that I am ok with short term increase in xanax dosing.  We will follow closely.  Hopefully we can get her back down to daily dosing.  Discussed counseling.  She wants to hold at this time.  Will get her back her in soon to reassess.  She is feeling better.  No panic attacks since increasing xanax to bid.

## 2014-04-27 ENCOUNTER — Ambulatory Visit: Payer: Medicare Other | Admitting: Internal Medicine

## 2014-05-25 ENCOUNTER — Telehealth: Payer: Self-pay | Admitting: *Deleted

## 2014-05-25 NOTE — Telephone Encounter (Signed)
Pt called to notify you that she cancelled her appt tomorrow (you told her she could if she didn't need it). She states that she will see you in October & she is now only taking one Xanax per day.

## 2014-05-25 NOTE — Telephone Encounter (Signed)
Noted  

## 2014-05-26 ENCOUNTER — Ambulatory Visit: Payer: PRIVATE HEALTH INSURANCE | Admitting: Internal Medicine

## 2014-05-26 DIAGNOSIS — D485 Neoplasm of uncertain behavior of skin: Secondary | ICD-10-CM | POA: Diagnosis not present

## 2014-06-15 ENCOUNTER — Encounter: Payer: Self-pay | Admitting: Internal Medicine

## 2014-06-15 ENCOUNTER — Ambulatory Visit (INDEPENDENT_AMBULATORY_CARE_PROVIDER_SITE_OTHER): Payer: Medicare Other | Admitting: Internal Medicine

## 2014-06-15 VITALS — BP 120/70 | HR 65 | Temp 97.8°F | Ht 63.25 in | Wt 117.8 lb

## 2014-06-15 DIAGNOSIS — Z79899 Other long term (current) drug therapy: Secondary | ICD-10-CM | POA: Diagnosis not present

## 2014-06-15 DIAGNOSIS — D72819 Decreased white blood cell count, unspecified: Secondary | ICD-10-CM | POA: Diagnosis not present

## 2014-06-15 DIAGNOSIS — F419 Anxiety disorder, unspecified: Secondary | ICD-10-CM

## 2014-06-15 DIAGNOSIS — K2960 Other gastritis without bleeding: Secondary | ICD-10-CM | POA: Diagnosis not present

## 2014-06-15 DIAGNOSIS — G43709 Chronic migraine without aura, not intractable, without status migrainosus: Secondary | ICD-10-CM

## 2014-06-15 DIAGNOSIS — Z79818 Long term (current) use of other agents affecting estrogen receptors and estrogen levels: Secondary | ICD-10-CM

## 2014-06-15 MED ORDER — ALPRAZOLAM 0.25 MG PO TABS: 0.2500 mg | ORAL_TABLET | Freq: Every day | ORAL | Status: DC

## 2014-06-15 NOTE — Progress Notes (Signed)
Pre visit review using our clinic review tool, if applicable. No additional management support is needed unless otherwise documented below in the visit note. 

## 2014-06-16 ENCOUNTER — Telehealth: Payer: Self-pay | Admitting: Internal Medicine

## 2014-06-16 ENCOUNTER — Encounter: Payer: Self-pay | Admitting: Internal Medicine

## 2014-06-16 NOTE — Telephone Encounter (Signed)
Pt needs physical scheduled after 10/19/14.

## 2014-06-16 NOTE — Progress Notes (Signed)
Subjective:    Patient ID: Brandi Ramos, female    DOB: [birth year], 68 y.o.   MRN: [id number]  HPI 68 year old female with past history of migraine/tension headaches, FCD and diverticulosis who comes in today for a scheduled follow up.  She had been having issues with increased anxiety and stress previously.  She is in a new relationship.  Increased stress with this and some family issues.  She is doing better.  Overall feels better.  Only taking one xanax per day now.  Family is supportive.  No nausea or vomiting.  Bowels stable.  Overall better.           Past Medical History  Diagnosis Date  . Migraine headache   . Neutropenia     worked up - Dr Oliva Bustard, slightly elevated ANA  . PUD (peptic ulcer disease)   . Anxiety   . Fibrocystic breast disease   . Diverticulosis   . GERD (gastroesophageal reflux disease)   . Arthritis   . Chicken pox      Current Outpatient Prescriptions on File Prior to Visit  Medication Sig Dispense Refill  . DULoxetine (CYMBALTA) 60 MG capsule TAKE 1 CAPSULE DAILY  90 capsule  1  . fexofenadine (ALLEGRA) 60 MG tablet Take 60 mg by mouth daily.       . fluticasone (FLONASE) 50 MCG/ACT nasal spray Place 2 sprays into both nostrils daily.  16 g  3  . propranolol (INDERAL) 40 MG tablet Take 1 tablet (40 mg total) by mouth daily.  90 tablet  3  . RABEprazole (ACIPHEX) 20 MG tablet Take 1 tablet (20 mg total) by mouth daily.  90 tablet  3  . rizatriptan (MAXALT) 10 MG tablet Take 10 mg by mouth as needed. May repeat in 2 hours if needed      . topiramate (TOPAMAX) 25 MG tablet Take 5 tablets by mouth daily       No current facility-administered medications on file prior to visit.    Review of Systems Increased anxiety and stress as outlined.  Doing better.   Previously having some panic attacks.  Better.  She had lost some weight.  Appetite is better now.  Weight is stable from last check.  Up one pound.   Eating better.  No nausea or vomiting.  No bowel  change.  Breathing stable.           Objective:   Physical Exam  Filed Vitals:   06/15/14 1645  BP: 120/70  Pulse: 65  Temp: 97.8 F (16.36 C)   68 year old female in no acute distress.   HEENT:  Nares- clear.  Oropharynx - without lesions. NECK:  Supple.  Nontender.  No audible bruit.  HEART:  Appears to be regular. LUNGS:  No crackles or wheezing audible.  Respirations even and unlabored.  RADIAL PULSE:  Equal bilaterally.  ABDOMEN:  Soft, nontender.  Bowel sounds present and normal.  No audible abdominal bruit.   EXTREMITIES:  No increased edema present.  DP pulses palpable and equal bilaterally.          Assessment & Plan:  HEALTH MAINTENANCE.  Physical 10/19/13..   S/p hysterectomy and does not require yearly pap smears.  Colonoscopy 04/26/08 - adenomatous polyp in the cecum, diverticulosis and internal hemorrhoids.  Recommended follow up colonoscopy in five years.  Last colonoscopy 10/13/13 - tortuous colon.  Recommended f/u colonoscopy in five years.  Bone density normal 06/22/08.    1.  Anxiety Doing better.  On cymbalta.  Has decreased her xanax back to one per day.  Follow.    2. Gastritis, bile acid reflux EGD 12/08/12 - bile gastritis, hiatal hernia.  Doing well on aciphex.  Symptoms improved.  Follow.    4. Leukopenia Counts have varied, but overall have remained stable.  Follow cbc.    5. 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  Has seen neurology.

## 2014-06-30 ENCOUNTER — Other Ambulatory Visit: Payer: Self-pay | Admitting: Internal Medicine

## 2014-07-09 ENCOUNTER — Telehealth: Payer: Self-pay | Admitting: Internal Medicine

## 2014-07-09 ENCOUNTER — Other Ambulatory Visit: Payer: Self-pay | Admitting: *Deleted

## 2014-07-09 ENCOUNTER — Other Ambulatory Visit: Payer: Self-pay | Admitting: Internal Medicine

## 2014-07-09 MED ORDER — DULOXETINE HCL 60 MG PO CPEP: ORAL_CAPSULE | ORAL | Status: DC

## 2014-09-01 ENCOUNTER — Encounter: Payer: Self-pay | Admitting: *Deleted

## 2014-09-01 ENCOUNTER — Other Ambulatory Visit: Payer: Self-pay | Admitting: Internal Medicine

## 2014-09-01 NOTE — Telephone Encounter (Signed)
ok'd refill for xanax #30 with no refills.   

## 2014-09-01 NOTE — Telephone Encounter (Signed)
rx faxed

## 2014-09-01 NOTE — Telephone Encounter (Signed)
Last refill 11.30.15.  Last OV 10.6.15.  Please advise refill

## 2014-09-13 DIAGNOSIS — M2241 Chondromalacia patellae, right knee: Secondary | ICD-10-CM | POA: Diagnosis not present

## 2014-09-17 ENCOUNTER — Other Ambulatory Visit: Payer: Self-pay | Admitting: Internal Medicine

## 2014-10-05 ENCOUNTER — Other Ambulatory Visit: Payer: Self-pay | Admitting: Internal Medicine

## 2014-10-05 NOTE — Telephone Encounter (Signed)
Last OV 10.6.15, last refill 12.23.15.  Please advise refill.

## 2014-10-05 NOTE — Telephone Encounter (Signed)
ok'd refill xanax #30 with no refills.   

## 2014-10-06 NOTE — Telephone Encounter (Signed)
Rx called in to pharmacy. 

## 2014-10-14 DIAGNOSIS — M2241 Chondromalacia patellae, right knee: Secondary | ICD-10-CM | POA: Diagnosis not present

## 2014-10-21 DIAGNOSIS — H02401 Unspecified ptosis of right eyelid: Secondary | ICD-10-CM | POA: Diagnosis not present

## 2014-10-22 ENCOUNTER — Other Ambulatory Visit (INDEPENDENT_AMBULATORY_CARE_PROVIDER_SITE_OTHER): Payer: Medicare Other

## 2014-10-22 ENCOUNTER — Telehealth: Payer: Self-pay | Admitting: *Deleted

## 2014-10-22 DIAGNOSIS — E7800 Pure hypercholesterolemia, unspecified: Secondary | ICD-10-CM

## 2014-10-22 DIAGNOSIS — D72819 Decreased white blood cell count, unspecified: Secondary | ICD-10-CM

## 2014-10-22 DIAGNOSIS — R5383 Other fatigue: Secondary | ICD-10-CM | POA: Diagnosis not present

## 2014-10-22 DIAGNOSIS — Z13220 Encounter for screening for lipoid disorders: Secondary | ICD-10-CM

## 2014-10-22 LAB — LIPID PANEL
CHOLESTEROL: 184 mg/dL (ref 0–200)
HDL: 61.4 mg/dL (ref >39.00)
LDL Cholesterol: 107 mg/dL — ABNORMAL HIGH (ref 0–99)
NONHDL: 122.6
TRIGLYCERIDES: 78 mg/dL (ref 0.0–149.0)
Total CHOL/HDL Ratio: 3
VLDL: 15.6 mg/dL (ref 0.0–40.0)

## 2014-10-22 LAB — CBC WITH DIFFERENTIAL/PLATELET
BASOS ABS: 0 10*3/uL (ref 0.0–0.1)
Basophils Relative: 0.5 % (ref 0.0–3.0)
Eosinophils Absolute: 0.1 10*3/uL (ref 0.0–0.7)
Eosinophils Relative: 1.7 % (ref 0.0–5.0)
HCT: 36.7 % (ref 36.0–46.0)
Hemoglobin: 12.2 g/dL (ref 12.0–15.0)
LYMPHS ABS: 1.5 10*3/uL (ref 0.7–4.0)
Lymphocytes Relative: 44.3 % (ref 12.0–46.0)
MCHC: 33.3 g/dL (ref 30.0–36.0)
MCV: 87.2 fl (ref 78.0–100.0)
Monocytes Absolute: 0.3 10*3/uL (ref 0.1–1.0)
Monocytes Relative: 10.3 % (ref 3.0–12.0)
NEUTROS ABS: 1.5 10*3/uL (ref 1.4–7.7)
Neutrophils Relative %: 43.2 % (ref 43.0–77.0)
Platelets: 151 10*3/uL (ref 150.0–400.0)
RBC: 4.21 Mil/uL (ref 3.87–5.11)
RDW: 14.6 % (ref 11.5–15.5)
WBC: 3.4 10*3/uL — ABNORMAL LOW (ref 4.0–10.5)

## 2014-10-22 LAB — COMPREHENSIVE METABOLIC PANEL
ALT: 10 U/L (ref 0–35)
AST: 19 U/L (ref 0–37)
Albumin: 3.9 g/dL (ref 3.5–5.2)
Alkaline Phosphatase: 64 U/L (ref 39–117)
BILIRUBIN TOTAL: 0.4 mg/dL (ref 0.2–1.2)
BUN: 17 mg/dL (ref 6–23)
CHLORIDE: 109 meq/L (ref 96–112)
CO2: 31 mEq/L (ref 19–32)
Calcium: 9.3 mg/dL (ref 8.4–10.5)
Creatinine, Ser: 0.82 mg/dL (ref 0.40–1.20)
GFR: 74.07 mL/min (ref >60.00)
Glucose, Bld: 98 mg/dL (ref 70–99)
Potassium: 3.7 mEq/L (ref 3.5–5.1)
Sodium: 142 mEq/L (ref 135–145)
Total Protein: 6.5 g/dL (ref 6.0–8.3)

## 2014-10-22 LAB — TSH: TSH: 1.92 u[IU]/mL (ref 0.35–4.50)

## 2014-10-22 NOTE — Telephone Encounter (Signed)
What labs and dX?  

## 2014-10-22 NOTE — Telephone Encounter (Signed)
Orders placed for labs

## 2014-10-24 ENCOUNTER — Encounter: Payer: Self-pay | Admitting: Internal Medicine

## 2014-10-25 ENCOUNTER — Encounter: Payer: PRIVATE HEALTH INSURANCE | Admitting: Internal Medicine

## 2014-10-27 NOTE — Telephone Encounter (Signed)
Unread mychart message mailed to patient 

## 2014-10-29 ENCOUNTER — Encounter: Payer: Self-pay | Admitting: Internal Medicine

## 2014-10-29 ENCOUNTER — Ambulatory Visit (INDEPENDENT_AMBULATORY_CARE_PROVIDER_SITE_OTHER): Payer: Medicare Other | Admitting: Internal Medicine

## 2014-10-29 VITALS — BP 124/78 | HR 77 | Temp 97.7°F | Wt 119.0 lb

## 2014-10-29 DIAGNOSIS — J0100 Acute maxillary sinusitis, unspecified: Secondary | ICD-10-CM

## 2014-10-29 DIAGNOSIS — B379 Candidiasis, unspecified: Secondary | ICD-10-CM

## 2014-10-29 DIAGNOSIS — T3695XA Adverse effect of unspecified systemic antibiotic, initial encounter: Secondary | ICD-10-CM

## 2014-10-29 MED ORDER — FLUCONAZOLE 150 MG PO TABS: 150.0000 mg | ORAL_TABLET | Freq: Once | ORAL | Status: DC

## 2014-10-29 MED ORDER — AMOXICILLIN-POT CLAVULANATE 875-125 MG PO TABS: 1.0000 | ORAL_TABLET | Freq: Two times a day (BID) | ORAL | Status: DC

## 2014-10-29 NOTE — Progress Notes (Signed)
HPI  Pt presents to the clinic today with c/o headache, facial pain and pressure, left ear pain, and nasal congestion. She reports this started 1 week ago. She is blowing yellow mucous out of her nose. She has had fever, chills and body aches. She has tried Mucinex, Claritin, Flonase and Tylenol with minimal relief. She does not have any history of allergies or breathing problems. She has sick contacts.  Review of Systems    Past Medical History  Diagnosis Date  . Migraine headache   . Neutropenia     worked up - Dr Oliva Bustard, slightly elevated ANA  . PUD (peptic ulcer disease)   . Anxiety   . Fibrocystic breast disease   . Diverticulosis   . GERD (gastroesophageal reflux disease)   . Arthritis   . Chicken pox     Family History  Problem Relation Age of Onset  . CAD Mother     s/p CABG  . Hypercholesterolemia Mother   . Hypertension Father   . Migraines Father   . Hypercholesterolemia Father   . Migraines Sister   . Lymphoma      grandmother  . Breast cancer Neg Hx   . Colon cancer Neg Hx     History   Social History  . Marital Status: Widowed    Spouse Name: N/A  . Number of Children: 2  . Years of Education: N/A   Occupational History  . Not on file.   Social History Main Topics  . Smoking status: Never Smoker   . Smokeless tobacco: Never Used  . Alcohol Use: Yes     Comment: wine occassionally  . Drug Use: No  . Sexual Activity: Not on file   Other Topics Concern  . Not on file   Social History Narrative    Allergies  Allergen Reactions  . Amitiza [Lubiprostone]   . Sulfa Antibiotics      Constitutional: Positive headache, fatigue and fever. Denies abrupt weight changes.  HEENT:  Positive facial pain, nasal congestion and sore throat. Denies eye redness, ear pain, ringing in the ears, wax buildup, runny nose or bloody nose. Respiratory: Denies cough, difficulty breathing or shortness of breath.  Cardiovascular: Denies chest pain, chest tightness,  palpitations or swelling in the hands or feet.   No other specific complaints in a complete review of systems (except as listed in HPI above).  Objective:  BP 124/78 mmHg  Pulse 77  Temp(Src) 97.7 F (36.5 C) (Oral)  Wt 119 lb (53.978 kg)  SpO2 98%   General: Appears her stated age, ill appearing in NAD. HEENT: Head: normal shape and size, maxillary sinus tenderness noted; Eyes: sclera white, no icterus, conjunctiva pink; Ears: Tm's pink but intact, normal light reflex, + effusion bilaterally; Nose: mucosa pink and moist, septum midline; Throat/Mouth: + PND. Teeth present, mucosa pink and moist, no exudate noted, no lesions or ulcerations noted.  Neck: No adenopathy noted. Cardiovascular: Normal rate and rhythm. S1,S2 noted.  No murmur, rubs or gallops noted. Pulmonary/Chest: Normal effort and positive vesicular breath sounds. No respiratory distress. No wheezes, rales or ronchi noted.      Assessment & Plan:   Acute maxillary sinusitis  Can use a Neti Pot which can be purchased from your local drug store. Flonase 2 sprays each nostril for 3 days and then as needed. eRx for Augmentin BID for 10 days eRx for Diflucan 150 mg po x 1 for antibiotic induced yeast infection  RTC as needed or if symptoms  persist.

## 2014-10-29 NOTE — Progress Notes (Signed)
Subjective:    Patient ID: Brandi Ramos, female    DOB: 1946-05-24, 69 y.o.   MRN: [id number]  HPI Brandi Ramos is a 69 year old female who presents today with chief complaint of sinus pain and pressure lasting one week.  She has had nasal congestion with yellow mucous.  Denies cough. Has been using OTC mucinex, tylenol, flonase and claritin without relief.  Endorses chills and fever.    Review of Systems  Constitutional: Positive for fever, chills and fatigue. Negative for activity change.  HENT: Positive for congestion, rhinorrhea and sinus pressure. Negative for ear pain and sore throat.   Respiratory: Negative for cough and shortness of breath.   Cardiovascular: Negative for chest pain, palpitations and leg swelling.  Skin: Negative for color change, pallor and rash.   Past Medical History  Diagnosis Date  . Migraine headache   . Neutropenia     worked up - Dr Oliva Bustard, slightly elevated ANA  . PUD (peptic ulcer disease)   . Anxiety   . Fibrocystic breast disease   . Diverticulosis   . GERD (gastroesophageal reflux disease)   . Arthritis   . Chicken pox    Family History  Problem Relation Age of Onset  . CAD Mother     s/p CABG  . Hypercholesterolemia Mother   . Hypertension Father   . Migraines Father   . Hypercholesterolemia Father   . Migraines Sister   . Lymphoma      grandmother  . Breast cancer Neg Hx   . Colon cancer Neg Hx    Current Outpatient Prescriptions on File Prior to Visit  Medication Sig Dispense Refill  . ALPRAZolam (XANAX) 0.25 MG tablet TAKE 1 TABLET BY MOUTH EVERY DAY 30 tablet 0  . DULoxetine (CYMBALTA) 60 MG capsule TAKE 1 CAPSULE DAILY 14 capsule 0  . DULoxetine (CYMBALTA) 60 MG capsule TAKE 1 CAPSULE DAILY 90 capsule 0  . fexofenadine (ALLEGRA) 60 MG tablet Take 60 mg by mouth daily.     . fluticasone (FLONASE) 50 MCG/ACT nasal spray SPRAY TWICE IN EACH NOSTRIL EVERY DAY 16 g 2  . propranolol (INDERAL) 40 MG tablet TAKE 1 TABLET DAILY  90 tablet 2  . RABEprazole (ACIPHEX) 20 MG tablet TAKE 1 TABLET DAILY 90 tablet 1  . rizatriptan (MAXALT) 10 MG tablet Take 10 mg by mouth as needed. May repeat in 2 hours if needed    . topiramate (TOPAMAX) 25 MG tablet Take 5 tablets by mouth daily     No current facility-administered medications on file prior to visit.        Objective:   Physical Exam  Constitutional: She is oriented to person, place, and time. She appears well-developed and well-nourished.  HENT:  Head: Normocephalic and atraumatic.  Right Ear: External ear normal.  Left Ear: External ear normal.  Mouth/Throat: Oropharynx is clear and moist.  Neck: Normal range of motion. Neck supple.  Cardiovascular: Normal rate, regular rhythm and normal heart sounds.   Pulmonary/Chest: Effort normal and breath sounds normal. She has no wheezes.  Musculoskeletal: Normal range of motion.  Lymphadenopathy:    She has no cervical adenopathy.  Neurological: She is alert and oriented to person, place, and time.  Skin: Skin is warm and dry.  BP 124/78 mmHg  Pulse 77  Temp(Src) 97.7 F (36.5 C) (Oral)  Wt 119 lb (53.978 kg)  SpO2 98%         Assessment & Plan:  1. Sinusitis  -  Will rx for Augmentin 875/125 po bid for 7 days.  Advised patient to continue using flonase and claritin.  Drink plenty of fluids and rest.  Follow up with office if symptoms persist.

## 2014-10-29 NOTE — Patient Instructions (Signed)

## 2014-10-29 NOTE — Progress Notes (Signed)
Pre visit review using our clinic review tool, if applicable. No additional management support is needed unless otherwise documented below in the visit note. 

## 2014-11-04 ENCOUNTER — Other Ambulatory Visit: Payer: Self-pay | Admitting: Internal Medicine

## 2014-11-04 NOTE — Telephone Encounter (Signed)
Last OV 10.6.15, next OV 4.8.16, last refill 1.27.16.  Please advise refill

## 2014-11-05 NOTE — Telephone Encounter (Signed)
ok'd alprazolam #30 with no refills.

## 2014-11-05 NOTE — Telephone Encounter (Signed)
rx faxed

## 2014-11-09 ENCOUNTER — Other Ambulatory Visit: Payer: Self-pay | Admitting: Internal Medicine

## 2014-11-09 NOTE — Telephone Encounter (Signed)
Pt left v/m; pt has taken one diflucan and the yeast infection is no better and pt request refill of diflucan to [facility]. Pt request cb.

## 2014-11-16 DIAGNOSIS — N760 Acute vaginitis: Secondary | ICD-10-CM | POA: Diagnosis not present

## 2014-12-02 ENCOUNTER — Other Ambulatory Visit: Payer: Self-pay | Admitting: Internal Medicine

## 2014-12-03 ENCOUNTER — Other Ambulatory Visit: Payer: Self-pay | Admitting: Internal Medicine

## 2014-12-06 NOTE — Telephone Encounter (Signed)
Duplicate.  ok'd refill xanax #30 with no refills.

## 2014-12-06 NOTE — Telephone Encounter (Signed)
Pt has appt on 12/17/14. Okay to refill? Last seen in October

## 2014-12-06 NOTE — Telephone Encounter (Signed)
Refilled xanax #30 with no refills.

## 2014-12-06 NOTE — Telephone Encounter (Signed)
Rx phoned in to pharmacy. Patient notified.

## 2014-12-06 NOTE — Telephone Encounter (Signed)
Patient called to check on status of refill. Patient completely out of medication at this time.

## 2014-12-17 ENCOUNTER — Encounter: Payer: Self-pay | Admitting: Internal Medicine

## 2014-12-17 ENCOUNTER — Ambulatory Visit (INDEPENDENT_AMBULATORY_CARE_PROVIDER_SITE_OTHER): Payer: Medicare Other | Admitting: Internal Medicine

## 2014-12-17 VITALS — BP 128/70 | HR 64 | Temp 98.1°F | Ht 64.0 in | Wt 119.1 lb

## 2014-12-17 DIAGNOSIS — Z91048 Other nonmedicinal substance allergy status: Secondary | ICD-10-CM

## 2014-12-17 DIAGNOSIS — F419 Anxiety disorder, unspecified: Secondary | ICD-10-CM | POA: Diagnosis not present

## 2014-12-17 DIAGNOSIS — G43709 Chronic migraine without aura, not intractable, without status migrainosus: Secondary | ICD-10-CM | POA: Diagnosis not present

## 2014-12-17 DIAGNOSIS — N898 Other specified noninflammatory disorders of vagina: Secondary | ICD-10-CM

## 2014-12-17 DIAGNOSIS — D72819 Decreased white blood cell count, unspecified: Secondary | ICD-10-CM | POA: Diagnosis not present

## 2014-12-17 DIAGNOSIS — Z139 Encounter for screening, unspecified: Secondary | ICD-10-CM

## 2014-12-17 DIAGNOSIS — Z0000 Encounter for general adult medical examination without abnormal findings: Secondary | ICD-10-CM

## 2014-12-17 DIAGNOSIS — Z9109 Other allergy status, other than to drugs and biological substances: Secondary | ICD-10-CM

## 2014-12-17 MED ORDER — DULOXETINE HCL 60 MG PO CPEP: ORAL_CAPSULE | ORAL | Status: DC

## 2014-12-17 NOTE — Progress Notes (Signed)
Patient ID: Brandi Ramos, female   DOB: [birth year], 69 y.o.   MRN: [id number]   Subjective:    Patient ID: Brandi Ramos, female    DOB: 01-Jan-1946, 69 y.o.   MRN: [id number]  HPI  Patient here for her physical exam. Has had some allergy issues recently.  Using claritin and flonase.  Minimal nose bleeding.  Discussed using saline.  Tries to stay active.  No cardiac symptoms with increased activity or exertion.  Breathing stable.  Eating and drinking well.  Bowels stable.  Reports some concern about some minimal vaginal irritation.  No discharge.     Past Medical History  Diagnosis Date  . Migraine headache   . Neutropenia     worked up - Dr Oliva Bustard, slightly elevated ANA  . PUD (peptic ulcer disease)   . Anxiety   . Fibrocystic breast disease   . Diverticulosis   . GERD (gastroesophageal reflux disease)   . Arthritis   . Chicken pox     Current Outpatient Prescriptions on File Prior to Visit  Medication Sig Dispense Refill  . ALPRAZolam (XANAX) 0.25 MG tablet TAKE 1 TABLET BY MOUTH DAILY AS NEEDED FOR ANXIETY 30 tablet 0  . fluconazole (DIFLUCAN) 150 MG tablet TAKE 1 TABLET BY MOUTH FOR 1 DOSE 1 tablet 0  . fluticasone (FLONASE) 50 MCG/ACT nasal spray SPRAY TWICE IN EACH NOSTRIL EVERY DAY 16 g 1  . propranolol (INDERAL) 40 MG tablet TAKE 1 TABLET DAILY 90 tablet 2  . RABEprazole (ACIPHEX) 20 MG tablet TAKE 1 TABLET DAILY 90 tablet 1  . rizatriptan (MAXALT) 10 MG tablet Take 10 mg by mouth as needed. May repeat in 2 hours if needed    . topiramate (TOPAMAX) 25 MG tablet Take 5 tablets by mouth daily     No current facility-administered medications on file prior to visit.    Review of Systems  Constitutional: Negative for activity change and unexpected weight change.  HENT: Negative for sinus pressure.        Some allergy symptoms.   Eyes: Negative for pain and visual disturbance.  Respiratory: Negative for cough, chest tightness and shortness of breath.   Cardiovascular:  Negative for chest pain, palpitations and leg swelling.  Gastrointestinal: Negative for nausea, vomiting, abdominal pain and diarrhea.  Genitourinary: Negative for dysuria and difficulty urinating.  Musculoskeletal: Negative for back pain and joint swelling.  Skin: Negative for color change and rash.  Neurological: Negative for dizziness, light-headedness and headaches.  Hematological: Negative for adenopathy. Does not bruise/bleed easily.  Psychiatric/Behavioral: Negative for dysphoric mood and agitation.       Objective:    Physical Exam  Constitutional: She is oriented to person, place, and time. She appears well-developed and well-nourished.  HENT:  Nose: Nose normal.  Mouth/Throat: Oropharynx is clear and moist.  Eyes: Right eye exhibits no discharge. Left eye exhibits no discharge. No scleral icterus.  Neck: Neck supple. No thyromegaly present.  Cardiovascular: Normal rate and regular rhythm.   Pulmonary/Chest: Breath sounds normal. No accessory muscle usage. No tachypnea. No respiratory distress. She has no decreased breath sounds. She has no wheezes. She has no rhonchi. Right breast exhibits no inverted nipple, no mass, no nipple discharge and no tenderness (no axillary adenopathy). Left breast exhibits no inverted nipple, no mass, no nipple discharge and no tenderness (no axilarry adenopathy).  Abdominal: Soft. Bowel sounds are normal. There is no tenderness.  Genitourinary:  Normal external genitalia.  Vaginal vault without lesions.  Some  atrophy changes present.  Could not appreciate any adnexal masses or tenderness.    Musculoskeletal: She exhibits no edema or tenderness.  Lymphadenopathy:    She has no cervical adenopathy.  Neurological: She is alert and oriented to person, place, and time.  Skin: Skin is warm. No rash noted.  Psychiatric: She has a normal mood and affect. Her behavior is normal.    BP 128/70 mmHg  Pulse 64  Temp(Src) 98.1 F (36.7 C) (Oral)  Ht 5'  4" (1.626 m)  Wt 119 lb 2 oz (54.035 kg)  BMI 20.44 kg/m2  SpO2 97% Wt Readings from Last 3 Encounters:  12/17/14 119 lb 2 oz (54.035 kg)  10/29/14 119 lb (53.978 kg)  06/15/14 117 lb 12 oz (53.411 kg)     Lab Results  Component Value Date   WBC 3.4* 10/22/2014   HGB 12.2 10/22/2014   HCT 36.7 10/22/2014   PLT 151.0 10/22/2014   GLUCOSE 98 10/22/2014   CHOL 184 10/22/2014   TRIG 78.0 10/22/2014   HDL 61.40 10/22/2014   LDLCALC 107* 10/22/2014   ALT 10 10/22/2014   AST 19 10/22/2014   NA 142 10/22/2014   K 3.7 10/22/2014   CL 109 10/22/2014   CREATININE 0.82 10/22/2014   BUN 17 10/22/2014   CO2 31 10/22/2014   TSH 1.92 10/22/2014       Assessment & Plan:   Problem List Items Addressed This Visit    Anxiety    Uses alprazolam.  On cymbalta.  In a new relationship.  Happy.  Follow.        Relevant Medications   DULoxetine (CYMBALTA) 60 MG capsule   Environmental allergies    Some allergy symptoms.  Uses flonase and claritin.  Can use saline nasal spray for the minimal bleeding.  Follow.        Health care maintenance    Physical 12/17/14.  Schedule mammogram.  Colonoscopy 10/13/13 - normal.  Recommended f/u colonoscopy in five years.       Leukopenia    Last cbc stable.  Follow.        Migraine headache    Seeing neurology.  Has been stable.       Relevant Medications   DULoxetine (CYMBALTA) 60 MG capsule   Vaginal irritation    Wet prep sent.  May be related to atrophy.        Other Visit Diagnoses    Screening    -  Primary    Relevant Orders    WET PREP BY MOLECULAR PROBE (Completed)      I spent 25 minutes with the patient and more than 50% of the time was spent in consultation regarding the above.     Einar Pheasant, MD

## 2014-12-17 NOTE — Progress Notes (Signed)
Pre visit review using our clinic review tool, if applicable. No additional management support is needed unless otherwise documented below in the visit note. 

## 2014-12-18 ENCOUNTER — Encounter: Payer: Self-pay | Admitting: Internal Medicine

## 2014-12-18 LAB — WET PREP BY MOLECULAR PROBE
Candida species: NEGATIVE
Gardnerella vaginalis: NEGATIVE
Trichomonas vaginosis: NEGATIVE

## 2014-12-20 NOTE — Telephone Encounter (Signed)
Left voicemail on patients cell to return my call. Tried to call home phone (no answer or voicemail). Mailed unread mychart also

## 2014-12-26 ENCOUNTER — Encounter: Payer: Self-pay | Admitting: Internal Medicine

## 2014-12-26 DIAGNOSIS — Z0000 Encounter for general adult medical examination without abnormal findings: Secondary | ICD-10-CM | POA: Insufficient documentation

## 2014-12-26 DIAGNOSIS — N898 Other specified noninflammatory disorders of vagina: Secondary | ICD-10-CM | POA: Insufficient documentation

## 2014-12-26 DIAGNOSIS — Z9109 Other allergy status, other than to drugs and biological substances: Secondary | ICD-10-CM | POA: Insufficient documentation

## 2014-12-26 NOTE — Assessment & Plan Note (Signed)
Seeing neurology.  Has been stable.

## 2014-12-26 NOTE — Assessment & Plan Note (Signed)
Last cbc stable.  Follow.

## 2014-12-26 NOTE — Assessment & Plan Note (Signed)
Wet prep sent.  May be related to atrophy.

## 2014-12-26 NOTE — Assessment & Plan Note (Signed)
Physical 12/17/14.  Schedule mammogram.  Colonoscopy 10/13/13 - normal.  Recommended f/u colonoscopy in five years.

## 2014-12-26 NOTE — Assessment & Plan Note (Signed)
Uses alprazolam.  On cymbalta.  In a new relationship.  Happy.  Follow.

## 2014-12-26 NOTE — Assessment & Plan Note (Signed)
Some allergy symptoms.  Uses flonase and claritin.  Can use saline nasal spray for the minimal bleeding.  Follow.

## 2015-01-03 ENCOUNTER — Other Ambulatory Visit: Payer: Self-pay | Admitting: Internal Medicine

## 2015-01-03 NOTE — Telephone Encounter (Signed)
rx ok'd for alprazolam #30 with 1 refill.   

## 2015-01-03 NOTE — Telephone Encounter (Signed)
Rx phoned into pharmacy.

## 2015-01-03 NOTE — Telephone Encounter (Signed)
Last visit 12/17/14, ok refill?

## 2015-01-15 DIAGNOSIS — M5441 Lumbago with sciatica, right side: Secondary | ICD-10-CM | POA: Diagnosis not present

## 2015-01-15 DIAGNOSIS — R350 Frequency of micturition: Secondary | ICD-10-CM | POA: Diagnosis not present

## 2015-01-18 ENCOUNTER — Other Ambulatory Visit: Payer: Self-pay

## 2015-01-18 DIAGNOSIS — Z1231 Encounter for screening mammogram for malignant neoplasm of breast: Secondary | ICD-10-CM

## 2015-01-26 ENCOUNTER — Other Ambulatory Visit: Payer: Self-pay

## 2015-01-26 MED ORDER — DULOXETINE HCL 60 MG PO CPEP: ORAL_CAPSULE | ORAL | Status: DC

## 2015-01-28 ENCOUNTER — Encounter: Payer: Self-pay | Admitting: Nurse Practitioner

## 2015-01-28 ENCOUNTER — Other Ambulatory Visit: Payer: Self-pay | Admitting: Nurse Practitioner

## 2015-01-28 ENCOUNTER — Ambulatory Visit (INDEPENDENT_AMBULATORY_CARE_PROVIDER_SITE_OTHER): Payer: Medicare Other | Admitting: Nurse Practitioner

## 2015-01-28 VITALS — BP 106/60 | HR 67 | Temp 98.0°F | Resp 16 | Ht 64.0 in | Wt 118.1 lb

## 2015-01-28 DIAGNOSIS — J069 Acute upper respiratory infection, unspecified: Secondary | ICD-10-CM | POA: Diagnosis not present

## 2015-01-28 MED ORDER — AZITHROMYCIN 250 MG PO TABS
ORAL_TABLET | ORAL | Status: DC
Start: 2015-01-28 — End: 2015-01-28

## 2015-01-28 MED ORDER — AZITHROMYCIN 250 MG PO TABS: ORAL_TABLET | ORAL | Status: DC

## 2015-01-28 NOTE — Patient Instructions (Addendum)
Take the antibiotic as prescribed.   Please take a probiotic ( Align, Floraque or Culturelle) while you are on the antibiotic to prevent a serious antibiotic associated diarrhea  Called clostirudium dificile colitis and a vaginal yeast infection.  Call us if not helpful next week. Lots of rest and fluids!

## 2015-01-28 NOTE — Progress Notes (Signed)
Pre visit review using our clinic review tool, if applicable. No additional management support is needed unless otherwise documented below in the visit note. 

## 2015-01-28 NOTE — Assessment & Plan Note (Signed)
Z-pack and probiotics recommended. Continue with OTC measures and FU prn worsening/failure to improve.

## 2015-01-28 NOTE — Progress Notes (Signed)
   Subjective:    Patient ID: Brandi Ramos, female    DOB: 06-20-1946, 69 y.o.   MRN: [id number]  HPI  Brandi Ramos is a 69 yo female with a CC of URI x 5 days.   1) [place] last weekend. Monday started with symptoms of congestion, redness of eyes, legs aching, fatigued. No improvement. Aching all over.   Sudafed- helpful   Claritin- helpful   Tylenol- helpful  Mucinex twice- helpful   Flonase- helpful   Review of Systems  Constitutional: Positive for chills and fatigue. Negative for fever and diaphoresis.  HENT: Positive for congestion, postnasal drip, rhinorrhea, sinus pressure, sneezing and sore throat.   Eyes: Positive for discharge, redness and itching. Negative for visual disturbance.       Watery discharge, feel gritty today  Respiratory: Positive for cough. Negative for chest tightness, shortness of breath and wheezing.   Cardiovascular: Negative for chest pain, palpitations and leg swelling.  Gastrointestinal: Negative for nausea, vomiting and diarrhea.  Skin: Negative for rash.  Neurological: Positive for headaches. Negative for dizziness and light-headedness.      Objective:   Physical Exam  Constitutional: She is oriented to person, place, and time. She appears well-developed and well-nourished. No distress.  BP 106/60 mmHg  Pulse 67  Temp(Src) 98 F (36.7 C)  Resp 16  Ht 5\' 4"  (1.626 m)  Wt 118 lb 1.9 oz (53.579 kg)  BMI 20.27 kg/m2  SpO2 98%   HENT:  Head: Normocephalic and atraumatic.  Right Ear: External ear normal.  Left Ear: External ear normal.  Mouth/Throat: Oropharynx is clear and moist.  Eyes: EOM are normal. Pupils are equal, round, and reactive to light. Right eye exhibits no discharge. Left eye exhibits no discharge. No scleral icterus.  Watery  Neck: Normal range of motion. Neck supple. No thyromegaly present.  Slight tenderness  Cardiovascular: Normal rate, regular rhythm and normal heart sounds.  Exam reveals no gallop and no friction rub.    No murmur heard. Pulmonary/Chest: Effort normal and breath sounds normal. No respiratory distress. She has no wheezes. She has no rales. She exhibits no tenderness.  Lymphadenopathy:    She has cervical adenopathy.  Neurological: She is alert and oriented to person, place, and time. No cranial nerve deficit. She exhibits normal muscle tone. Coordination normal.  Skin: Skin is warm and dry. No rash noted. She is not diaphoretic.  Psychiatric: She has a normal mood and affect. Her behavior is normal. Judgment and thought content normal.      Assessment & Plan:

## 2015-01-31 ENCOUNTER — Telehealth: Payer: Self-pay | Admitting: *Deleted

## 2015-01-31 ENCOUNTER — Ambulatory Visit
Admission: RE | Admit: 2015-01-31 | Discharge: 2015-01-31 | Disposition: A | Discharge status: Home / Self Care | Payer: Medicare Other | Source: Ambulatory Visit | Attending: Internal Medicine | Admitting: Internal Medicine

## 2015-01-31 ENCOUNTER — Other Ambulatory Visit: Payer: Self-pay

## 2015-01-31 DIAGNOSIS — Z1231 Encounter for screening mammogram for malignant neoplasm of breast: Secondary | ICD-10-CM | POA: Diagnosis not present

## 2015-01-31 MED ORDER — FLUCONAZOLE 150 MG PO TABS: ORAL_TABLET | ORAL | Status: DC

## 2015-01-31 NOTE — Telephone Encounter (Signed)
Pt aware rx sent.  

## 2015-01-31 NOTE — Telephone Encounter (Signed)
Pt called states she was to believe Diflucan was to be called in as a preventative while on antibiotics.  However Rx was not sent to pharmacy.  Pt is requesting Diflucan Rx be sent to pharmacy.

## 2015-01-31 NOTE — Telephone Encounter (Signed)
It appears that she saw Jane Lew on 01/28/15.  She may have told her she would call in [place].  I can call in the rx for diflucan, but she does not need to take it unless she has problems.

## 2015-02-16 ENCOUNTER — Ambulatory Visit (INDEPENDENT_AMBULATORY_CARE_PROVIDER_SITE_OTHER): Payer: Medicare Other | Admitting: Nurse Practitioner

## 2015-02-16 ENCOUNTER — Encounter: Payer: Self-pay | Admitting: Nurse Practitioner

## 2015-02-16 VITALS — BP 102/62 | HR 66 | Temp 97.7°F | Resp 12 | Ht 64.0 in | Wt 116.4 lb

## 2015-02-16 DIAGNOSIS — J069 Acute upper respiratory infection, unspecified: Secondary | ICD-10-CM

## 2015-02-16 MED ORDER — AZITHROMYCIN 250 MG PO TABS: ORAL_TABLET | ORAL | Status: DC

## 2015-02-16 NOTE — Patient Instructions (Signed)
Switch to Allegra, continue with probiotics, take diflucan if needed for yeast infection, if this doesn't help we may need to send you to ENT for further investigation.

## 2015-02-16 NOTE — Assessment & Plan Note (Signed)
Patient improved on Z-pack, but worsened this weekend. Would like to try a 2nd round before she goes on vacation. 5 days of worsening symptoms. Asked her to change to Allegra OTC, repeat Z-pack, and take probiotic. Asked her to let me know if not helpful then we can send her to ENT for further evaluation. FU prn worsening/failure to improve.

## 2015-02-16 NOTE — Progress Notes (Signed)
   Subjective:    Patient ID: Brandi Ramos, female    DOB: [birth year], 69 y.o.   MRN: [id number]  HPI  Brandi Ramos is a 69 yo female with a CC of cold symptoms x 5 days.  1) Z-pack helped, symptoms returned this weekend  Fatigue, chilled, in throat, coughing non-productive, nasal congestion, rhinorrhea, eyes feel like sandpaper. Did not have to take diflucan called in by Dr. Nicki Reaper  Treatment to date:  Mucinex  Claritin  Review of Systems  Constitutional: Positive for chills and fatigue. Negative for fever and diaphoresis.  HENT: Positive for congestion, postnasal drip, rhinorrhea and sinus pressure. Negative for ear discharge, ear pain, sneezing and sore throat.   Eyes: Positive for pain. Negative for visual disturbance.       Gritty feeling of eyes  Respiratory: Positive for cough. Negative for chest tightness, shortness of breath and wheezing.   Cardiovascular: Negative for chest pain, palpitations and leg swelling.  Musculoskeletal: Positive for myalgias.  Skin: Negative for rash.      Objective:   Physical Exam  Constitutional: She is oriented to person, place, and time. She appears well-developed and well-nourished. No distress.  BP 102/62 mmHg  Pulse 66  Temp(Src) 97.7 F (36.5 C)  Resp 12  Ht 5\' 4"  (1.626 m)  Wt 116 lb 6.4 oz (52.799 kg)  BMI 19.97 kg/m2  SpO2 98%   HENT:  Head: Normocephalic and atraumatic.  Right Ear: External ear normal.  Left Ear: External ear normal.  Eyes: Conjunctivae and EOM are normal. Pupils are equal, round, and reactive to light. Right eye exhibits no discharge. Left eye exhibits no discharge. No scleral icterus.  Cardiovascular: Normal rate and regular rhythm.   Pulmonary/Chest: Effort normal and breath sounds normal. No respiratory distress. She has no wheezes. She has no rales. She exhibits no tenderness.  Neurological: She is alert and oriented to person, place, and time. No cranial nerve deficit. She exhibits normal muscle  tone. Coordination normal.  Skin: Skin is warm and dry. No rash noted. She is not diaphoretic.  Psychiatric: She has a normal mood and affect. Her behavior is normal. Judgment and thought content normal.      Assessment & Plan:

## 2015-02-16 NOTE — Progress Notes (Signed)
Pre visit review using our clinic review tool, if applicable. No additional management support is needed unless otherwise documented below in the visit note. 

## 2015-03-04 ENCOUNTER — Other Ambulatory Visit: Payer: Self-pay | Admitting: Internal Medicine

## 2015-03-04 NOTE — Telephone Encounter (Signed)
Rx phoned into pharmacy.

## 2015-03-04 NOTE — Telephone Encounter (Signed)
Refilled alprazolam #30 with one refill 

## 2015-03-04 NOTE — Telephone Encounter (Signed)
Last visit with Dr. Nicki Reaper 12/17/14, ok refill?

## 2015-03-08 DIAGNOSIS — L570 Actinic keratosis: Secondary | ICD-10-CM | POA: Diagnosis not present

## 2015-03-08 DIAGNOSIS — L281 Prurigo nodularis: Secondary | ICD-10-CM | POA: Diagnosis not present

## 2015-04-05 ENCOUNTER — Other Ambulatory Visit: Payer: Self-pay | Admitting: Internal Medicine

## 2015-04-05 NOTE — Telephone Encounter (Signed)
Last OV 6.8.16 with Carrie, next OV 8.17.16. Please advise refill

## 2015-04-05 NOTE — Telephone Encounter (Signed)
I just sent in rx for #30 with one refill at the end of June.  Should not need refill.  Thanks.

## 2015-04-08 ENCOUNTER — Other Ambulatory Visit: Payer: Self-pay | Admitting: Internal Medicine

## 2015-04-27 ENCOUNTER — Encounter: Payer: Self-pay | Admitting: Internal Medicine

## 2015-04-27 ENCOUNTER — Ambulatory Visit (INDEPENDENT_AMBULATORY_CARE_PROVIDER_SITE_OTHER): Payer: Medicare Other | Admitting: Internal Medicine

## 2015-04-27 VITALS — BP 118/60 | HR 68 | Temp 98.2°F | Ht 64.0 in | Wt 115.0 lb

## 2015-04-27 DIAGNOSIS — R002 Palpitations: Secondary | ICD-10-CM | POA: Diagnosis not present

## 2015-04-27 DIAGNOSIS — F419 Anxiety disorder, unspecified: Secondary | ICD-10-CM

## 2015-04-27 DIAGNOSIS — G43709 Chronic migraine without aura, not intractable, without status migrainosus: Secondary | ICD-10-CM | POA: Diagnosis not present

## 2015-04-27 DIAGNOSIS — D72819 Decreased white blood cell count, unspecified: Secondary | ICD-10-CM | POA: Diagnosis not present

## 2015-04-27 LAB — CBC WITH DIFFERENTIAL/PLATELET
BASOS PCT: 0.6 % (ref 0.0–3.0)
Basophils Absolute: 0 10*3/uL (ref 0.0–0.1)
EOS ABS: 0.1 10*3/uL (ref 0.0–0.7)
Eosinophils Relative: 2 % (ref 0.0–5.0)
HEMATOCRIT: 36.6 % (ref 36.0–46.0)
Hemoglobin: 12.3 g/dL (ref 12.0–15.0)
LYMPHS PCT: 42.5 % (ref 12.0–46.0)
Lymphs Abs: 1.4 10*3/uL (ref 0.7–4.0)
MCHC: 33.6 g/dL (ref 30.0–36.0)
MCV: 86.9 fl (ref 78.0–100.0)
MONOS PCT: 10.5 % (ref 3.0–12.0)
Monocytes Absolute: 0.3 10*3/uL (ref 0.1–1.0)
NEUTROS ABS: 1.4 10*3/uL (ref 1.4–7.7)
Neutrophils Relative %: 44.4 % (ref 43.0–77.0)
PLATELETS: 150 10*3/uL (ref 150.0–400.0)
RBC: 4.21 Mil/uL (ref 3.87–5.11)
RDW: 14.6 % (ref 11.5–15.5)
WBC: 3.2 10*3/uL — ABNORMAL LOW (ref 4.0–10.5)

## 2015-04-27 LAB — COMPREHENSIVE METABOLIC PANEL
ALBUMIN: 4 g/dL (ref 3.5–5.2)
ALT: 11 U/L (ref 0–35)
AST: 22 U/L (ref 0–37)
Alkaline Phosphatase: 58 U/L (ref 39–117)
BUN: 14 mg/dL (ref 6–23)
CALCIUM: 9.3 mg/dL (ref 8.4–10.5)
CHLORIDE: 108 meq/L (ref 96–112)
CO2: 29 mEq/L (ref 19–32)
Creatinine, Ser: 0.82 mg/dL (ref 0.40–1.20)
GFR: 73.95 mL/min (ref >60.00)
Glucose, Bld: 80 mg/dL (ref 70–99)
POTASSIUM: 3.8 meq/L (ref 3.5–5.1)
Sodium: 142 mEq/L (ref 135–145)
Total Bilirubin: 0.4 mg/dL (ref 0.2–1.2)
Total Protein: 6.7 g/dL (ref 6.0–8.3)

## 2015-04-27 LAB — TSH: TSH: 1.9 u[IU]/mL (ref 0.35–4.50)

## 2015-04-27 MED ORDER — ALPRAZOLAM 0.25 MG PO TABS: 0.2500 mg | ORAL_TABLET | Freq: Every day | ORAL | Status: DC | PRN

## 2015-04-27 NOTE — Progress Notes (Signed)
Patient ID: Brandi Ramos, female   DOB: Sep 21, 1945, 69 y.o.   MRN: [id number]   Subjective:    Patient ID: Brandi Ramos, female    DOB: 12-23-1945, 69 y.o.   MRN: [id number]  HPI  Patient here for a scheduled follow up.  She tries to stay active.  Denies any cardiac symptoms with increased activity or exertion.  No sob.  No change in appetite.  No nausea or vomiting.  Bowels stable.  Some increased anxiety.  Takes xanax - one daily.  Helps her relax.  Discussed at length with her today.  Does not feel needs any further intervention at this point.  Had colonoscopy 10/13/13.     Past Medical History  Diagnosis Date  . Migraine headache   . Neutropenia     worked up - Dr Oliva Bustard, slightly elevated ANA  . PUD (peptic ulcer disease)   . Anxiety   . Fibrocystic breast disease   . Diverticulosis   . GERD (gastroesophageal reflux disease)   . Arthritis   . Chicken pox   . Cancer     skin    Family history and social history reviewed.     Outpatient Encounter Prescriptions as of 04/27/2015  Medication Sig  . ALPRAZolam (XANAX) 0.25 MG tablet Take 1 tablet (0.25 mg total) by mouth daily as needed.  . DULoxetine (CYMBALTA) 60 MG capsule TAKE 1 CAPSULE DAILY  . fluticasone (FLONASE) 50 MCG/ACT nasal spray SPRAY TWICE IN EACH NOSTRIL EVERY DAY  . LINZESS 145 MCG CAPS capsule Take 145 mcg by mouth daily.  Marland Kitchen loratadine (CLARITIN) 10 MG tablet Take 10 mg by mouth daily.  . propranolol (INDERAL) 40 MG tablet TAKE 1 TABLET DAILY  . RABEprazole (ACIPHEX) 20 MG tablet TAKE 1 TABLET DAILY  . rizatriptan (MAXALT) 10 MG tablet Take 10 mg by mouth as needed. May repeat in 2 hours if needed  . sucralfate (CARAFATE) 1 G tablet Take 1 tablet by mouth 2 (two) times daily as needed.  . topiramate (TOPAMAX) 50 MG tablet Take 150 mg by mouth at bedtime.  . [DISCONTINUED] ALPRAZolam (XANAX) 0.25 MG tablet TAKE 1 TABLET BY MOUTH EVERY DAY AS NEEDED  . [DISCONTINUED] ALPRAZolam (XANAX) 0.25 MG tablet Take  1 tablet (0.25 mg total) by mouth daily as needed.  . [DISCONTINUED] fluconazole (DIFLUCAN) 150 MG tablet TAKE 1 TABLET BY MOUTH FOR 1 DOSE  . [DISCONTINUED] azithromycin (ZITHROMAX) 250 MG tablet Take 2 tablets by mouth on day 1, take 1 tablet by mouth each day after for 4 days.  . [DISCONTINUED] topiramate (TOPAMAX) 25 MG tablet Take 50 mg by mouth 3 (three) times daily.    No facility-administered encounter medications on file as of 04/27/2015.    Review of Systems  Constitutional: Negative for appetite change and unexpected weight change.  HENT: Negative for congestion and sinus pressure.   Respiratory: Negative for cough, chest tightness and shortness of breath.   Cardiovascular: Negative for chest pain, palpitations and leg swelling.  Gastrointestinal: Negative for nausea, vomiting, abdominal pain and diarrhea.  Genitourinary: Negative for dysuria and difficulty urinating.  Musculoskeletal: Negative for back pain and joint swelling.  Skin: Negative for color change and rash.  Neurological: Negative for dizziness, light-headedness and headaches.  Psychiatric/Behavioral: Negative for dysphoric mood and agitation.       Increased anxiety as outlined.         Objective:     Blood pressure rechecked by me:  122/68  Physical Exam  Constitutional: She appears well-developed and well-nourished. No distress.  HENT:  Nose: Nose normal.  Mouth/Throat: Oropharynx is clear and moist.  Eyes: Conjunctivae are normal. Right eye exhibits no discharge. Left eye exhibits no discharge.  Neck: Neck supple. No thyromegaly present.  Cardiovascular: Normal rate and regular rhythm.   Pulmonary/Chest: Breath sounds normal. No respiratory distress. She has no wheezes.  Abdominal: Soft. Bowel sounds are normal. There is no tenderness.  Musculoskeletal: She exhibits no edema or tenderness.  Lymphadenopathy:    She has no cervical adenopathy.  Skin: No rash noted. No erythema.  Psychiatric: She has a  normal mood and affect. Her behavior is normal.    BP 118/60 mmHg  Pulse 68  Temp(Src) 98.2 F (36.8 C) (Oral)  Ht 5\' 4"  (1.626 m)  Wt 115 lb (52.164 kg)  BMI 19.73 kg/m2  SpO2 97% Wt Readings from Last 3 Encounters:  04/27/15 115 lb (52.164 kg)  02/16/15 116 lb 6.4 oz (52.799 kg)  01/28/15 118 lb 1.9 oz (53.579 kg)     Lab Results  Component Value Date   WBC 3.2* 04/27/2015   HGB 12.3 04/27/2015   HCT 36.6 04/27/2015   PLT 150.0 04/27/2015   GLUCOSE 80 04/27/2015   CHOL 184 10/22/2014   TRIG 78.0 10/22/2014   HDL 61.40 10/22/2014   LDLCALC 107* 10/22/2014   ALT 11 04/27/2015   AST 22 04/27/2015   NA 142 04/27/2015   K 3.8 04/27/2015   CL 108 04/27/2015   CREATININE 0.82 04/27/2015   BUN 14 04/27/2015   CO2 29 04/27/2015   TSH 1.90 04/27/2015    Mm Screening Breast Tomo Bilateral  02/01/2015   CLINICAL DATA:  Screening.  EXAM: DIGITAL SCREENING BILATERAL MAMMOGRAM WITH 3D TOMO WITH CAD  COMPARISON:  Previous exam(s).  ACR Breast Density Category c: The breast tissue is heterogeneously dense, which may obscure small masses.  FINDINGS: There are no findings suspicious for malignancy. Images were processed with CAD.  IMPRESSION: No mammographic evidence of malignancy. A result letter of this screening mammogram will be mailed directly to the patient.  RECOMMENDATION: Screening mammogram in one year. (Code:SM-B-01Y)  BI-RADS CATEGORY  1: Negative.   Electronically Signed   By: Altamese Cabal M.D.   On: 02/01/2015 08:18       Assessment & Plan:   Problem List Items Addressed This Visit    Anxiety    On cymbalta.  Takes one xanax daily.  Relationship going well.  Still with some increased anxiety.  Discussed with her today.  Follow.  Hold on making medication changes.        Relevant Medications   ALPRAZolam (XANAX) 0.25 MG tablet   Leukopenia - Primary    Recheck cbc to confirm stable.        Relevant Orders   CBC with Differential/Platelet (Completed)    Migraine headache    Has been seeing neurology.  Stable.        Relevant Medications   topiramate (TOPAMAX) 50 MG tablet   Palpitations    Has a history of palpitations.  Has previously been worked up by cardiology.  Will occasionally notice.  States no change.  Discussed further w/up, including EKG, etc.  She declines at this time.  Will notify me if she changes her mind or any change in symptoms.        Relevant Orders   TSH (Completed)   Comprehensive metabolic panel (Completed)       Einar Pheasant, MD

## 2015-04-27 NOTE — Progress Notes (Signed)
Pre visit review using our clinic review tool, if applicable. No additional management support is needed unless otherwise documented below in the visit note. 

## 2015-05-02 ENCOUNTER — Encounter: Payer: Self-pay | Admitting: Internal Medicine

## 2015-05-02 NOTE — Assessment & Plan Note (Signed)
Has a history of palpitations.  Has previously been worked up by cardiology.  Will occasionally notice.  States no change.  Discussed further w/up, including EKG, etc.  She declines at this time.  Will notify me if she changes her mind or any change in symptoms.

## 2015-05-02 NOTE — Assessment & Plan Note (Signed)
Has been seeing neurology.  Stable.

## 2015-05-02 NOTE — Assessment & Plan Note (Signed)
On cymbalta.  Takes one xanax daily.  Relationship going well.  Still with some increased anxiety.  Discussed with her today.  Follow.  Hold on making medication changes.

## 2015-05-02 NOTE — Assessment & Plan Note (Signed)
Recheck cbc to confirm stable.  

## 2015-05-04 NOTE — Telephone Encounter (Signed)
Unread mychart message mailed to patient 

## 2015-05-25 ENCOUNTER — Other Ambulatory Visit: Payer: Self-pay

## 2015-05-25 MED ORDER — PROPRANOLOL HCL 40 MG PO TABS: 40.0000 mg | ORAL_TABLET | Freq: Every day | ORAL | Status: DC

## 2015-06-27 ENCOUNTER — Encounter: Payer: Self-pay | Admitting: Internal Medicine

## 2015-06-27 ENCOUNTER — Ambulatory Visit (INDEPENDENT_AMBULATORY_CARE_PROVIDER_SITE_OTHER): Payer: Medicare Other | Admitting: Internal Medicine

## 2015-06-27 VITALS — BP 120/70 | HR 72 | Temp 98.3°F | Resp 18 | Ht 64.0 in | Wt 115.5 lb

## 2015-06-27 DIAGNOSIS — D72819 Decreased white blood cell count, unspecified: Secondary | ICD-10-CM | POA: Diagnosis not present

## 2015-06-27 DIAGNOSIS — Z79899 Other long term (current) drug therapy: Secondary | ICD-10-CM | POA: Diagnosis not present

## 2015-06-27 DIAGNOSIS — Z23 Encounter for immunization: Secondary | ICD-10-CM

## 2015-06-27 DIAGNOSIS — F419 Anxiety disorder, unspecified: Secondary | ICD-10-CM

## 2015-06-27 DIAGNOSIS — N898 Other specified noninflammatory disorders of vagina: Secondary | ICD-10-CM

## 2015-06-27 DIAGNOSIS — G43709 Chronic migraine without aura, not intractable, without status migrainosus: Secondary | ICD-10-CM

## 2015-06-27 MED ORDER — ALPRAZOLAM 0.25 MG PO TABS: 0.2500 mg | ORAL_TABLET | Freq: Every day | ORAL | Status: DC | PRN

## 2015-06-27 NOTE — Assessment & Plan Note (Signed)
Increased stress as outlined.  Discussed at length with her today.  Takes xanax daily and this works well for her.  On cymbalta.  Follow.  Will notify me if she feels she needs anything more.  Comfortable with medications and treatment as outlined.

## 2015-06-27 NOTE — Progress Notes (Signed)
Pre-visit discussion using our clinic review tool. No additional management support is needed unless otherwise documented below in the visit note.  

## 2015-06-27 NOTE — Assessment & Plan Note (Signed)
White count has been stable.  Follow cbc.  

## 2015-06-27 NOTE — Progress Notes (Signed)
Patient ID: Brandi Ramos, female   DOB: 1946/07/15, 69 y.o.   MRN: [id number]   Subjective:    Patient ID: Brandi Ramos, female    DOB: [birth year], 69 y.o.   MRN: [id number]  HPI  Patient with past history of migraine headache, anxiety and leukopenia who comes in today to follow up on these issues.  Increased stress with her family situation.  Discussed at length with her today.  She takes xanax q day and this works well for her.  She is also on cymbalta.  Headaches are not a significant issue for her now.  Tries to stay active.  No cardiac symptoms with increased activity or exertion.  No sob.  She is eating.  Does get hungry.  Some decreased appetite with increased stress.  Weight is stable. Bowels stable.     Past Medical History  Diagnosis Date  . Migraine headache   . Neutropenia ([place])     worked up - Dr Oliva Bustard, slightly elevated ANA  . PUD (peptic ulcer disease)   . Anxiety   . Fibrocystic breast disease   . Diverticulosis   . GERD (gastroesophageal reflux disease)   . Arthritis   . Chicken pox   . Cancer [facility])     skin   Past Surgical History  Procedure Laterality Date  . Abdominal hysterectomy  1984   Family History  Problem Relation Age of Onset  . CAD Mother     s/p CABG  . Hypercholesterolemia Mother   . Breast cancer Mother 91  . Hypertension Father   . Migraines Father   . Hypercholesterolemia Father   . Migraines Sister   . Lymphoma      grandmother  . Colon cancer Neg Hx    Social History   Social History  . Marital Status: Widowed    Spouse Name: N/A  . Number of Children: 2  . Years of Education: N/A   Social History Main Topics  . Smoking status: Never Smoker   . Smokeless tobacco: Never Used  . Alcohol Use: 0.0 oz/week    0 Standard drinks or equivalent per week     Comment: wine occassionally  . Drug Use: No  . Sexual Activity: Not Asked   Other Topics Concern  . None   Social History Narrative    Outpatient Encounter  Prescriptions as of 06/27/2015  Medication Sig  . ALPRAZolam (XANAX) 0.25 MG tablet Take 1 tablet (0.25 mg total) by mouth daily as needed.  . DULoxetine (CYMBALTA) 60 MG capsule TAKE 1 CAPSULE DAILY  . fluticasone (FLONASE) 50 MCG/ACT nasal spray SPRAY TWICE IN EACH NOSTRIL EVERY DAY  . LINZESS 145 MCG CAPS capsule Take 145 mcg by mouth daily.  Marland Kitchen loratadine (CLARITIN) 10 MG tablet Take 10 mg by mouth daily.  . propranolol (INDERAL) 40 MG tablet Take 1 tablet (40 mg total) by mouth daily.  . RABEprazole (ACIPHEX) 20 MG tablet TAKE 1 TABLET DAILY  . rizatriptan (MAXALT) 10 MG tablet Take 10 mg by mouth as needed. May repeat in 2 hours if needed  . sucralfate (CARAFATE) 1 G tablet Take 1 tablet by mouth 2 (two) times daily as needed.  . topiramate (TOPAMAX) 50 MG tablet Take 150 mg by mouth at bedtime.  . [DISCONTINUED] ALPRAZolam (XANAX) 0.25 MG tablet Take 1 tablet (0.25 mg total) by mouth daily as needed.   No facility-administered encounter medications on file as of 06/27/2015.    Review of Systems  Constitutional: Negative  for unexpected weight change.       Appetite as outlined.   HENT: Negative for congestion and sinus pressure.   Eyes: Negative for discharge and visual disturbance.  Respiratory: Negative for cough, chest tightness and shortness of breath.   Cardiovascular: Negative for chest pain, palpitations and leg swelling.  Gastrointestinal: Negative for nausea, vomiting, abdominal pain and diarrhea.  Genitourinary: Negative for dysuria and difficulty urinating.       No vaginal symptoms reported.   Musculoskeletal: Negative for back pain and joint swelling.  Skin: Negative for color change and rash.  Neurological: Negative for dizziness and light-headedness.       Headaches not reported as a significant issue today.    Psychiatric/Behavioral: Negative for dysphoric mood and agitation.       Increased stress as outlined.         Objective:    Physical Exam    Constitutional: She appears well-developed and well-nourished. No distress.  HENT:  Nose: Nose normal.  Mouth/Throat: Oropharynx is clear and moist.  Eyes: Conjunctivae are normal. Right eye exhibits no discharge. Left eye exhibits no discharge.  Neck: Neck supple. No thyromegaly present.  Cardiovascular: Normal rate and regular rhythm.   Pulmonary/Chest: Breath sounds normal. No respiratory distress. She has no wheezes.  Abdominal: Soft. Bowel sounds are normal. There is no tenderness.  Musculoskeletal: She exhibits no edema or tenderness.  Lymphadenopathy:    She has no cervical adenopathy.  Skin: No rash noted. No erythema.  Psychiatric: She has a normal mood and affect. Her behavior is normal.    BP 120/70 mmHg  Pulse 72  Temp(Src) 98.3 F (36.8 C) (Oral)  Resp 18  Ht 5\' 4"  (1.626 m)  Wt 115 lb 8 oz (52.39 kg)  BMI 19.82 kg/m2  SpO2 97% Wt Readings from Last 3 Encounters:  06/27/15 115 lb 8 oz (52.39 kg)  04/27/15 115 lb (52.164 kg)  02/16/15 116 lb 6.4 oz (52.799 kg)     Lab Results  Component Value Date   WBC 3.2* 04/27/2015   HGB 12.3 04/27/2015   HCT 36.6 04/27/2015   PLT 150.0 04/27/2015   GLUCOSE 80 04/27/2015   CHOL 184 10/22/2014   TRIG 78.0 10/22/2014   HDL 61.40 10/22/2014   LDLCALC 107* 10/22/2014   ALT 11 04/27/2015   AST 22 04/27/2015   NA 142 04/27/2015   K 3.8 04/27/2015   CL 108 04/27/2015   CREATININE 0.82 04/27/2015   BUN 14 04/27/2015   CO2 29 04/27/2015   TSH 1.90 04/27/2015    Mm Screening Breast Tomo Bilateral  02/01/2015  CLINICAL DATA:  Screening. EXAM: DIGITAL SCREENING BILATERAL MAMMOGRAM WITH 3D TOMO WITH CAD COMPARISON:  Previous exam(s). ACR Breast Density Category c: The breast tissue is heterogeneously dense, which may obscure small masses. FINDINGS: There are no findings suspicious for malignancy. Images were processed with CAD. IMPRESSION: No mammographic evidence of malignancy. A result letter of this screening mammogram  will be mailed directly to the patient. RECOMMENDATION: Screening mammogram in one year. (Code:SM-B-01Y) BI-RADS CATEGORY  1: Negative. Electronically Signed   By: Altamese Cabal M.D.   On: 02/01/2015 08:18       Assessment & Plan:   Problem List Items Addressed This Visit    Anxiety    Increased stress as outlined.  Discussed at length with her today.  Takes xanax daily and this works well for her.  On cymbalta.  Follow.  Will notify me if she feels she  needs anything more.  Comfortable with medications and treatment as outlined.        Relevant Medications   ALPRAZolam (XANAX) 0.25 MG tablet   Leukopenia    White count has been stable.  Follow cbc.        Migraine headache    Appears to be doing well on current regimen.  Not reported as an issue today.  Follow.        Vaginal irritation    Saw gyn.  See their note for details.  No problems reported today.  Follow.        Other Visit Diagnoses    Encounter for immunization    -  Primary        Einar Pheasant, MD

## 2015-06-27 NOTE — Assessment & Plan Note (Signed)
Saw gyn.  See their note for details.  No problems reported today.  Follow.

## 2015-06-27 NOTE — Patient Instructions (Signed)

## 2015-06-27 NOTE — Assessment & Plan Note (Signed)
Appears to be doing well on current regimen.  Not reported as an issue today.  Follow.

## 2015-07-21 ENCOUNTER — Encounter: Payer: Self-pay | Admitting: Internal Medicine

## 2015-07-22 ENCOUNTER — Other Ambulatory Visit: Payer: Self-pay | Admitting: Internal Medicine

## 2015-07-25 ENCOUNTER — Other Ambulatory Visit: Payer: Self-pay | Admitting: Internal Medicine

## 2015-07-25 ENCOUNTER — Telehealth: Payer: Self-pay | Admitting: *Deleted

## 2015-07-25 MED ORDER — DULOXETINE HCL 60 MG PO CPEP: 60.0000 mg | ORAL_CAPSULE | Freq: Every day | ORAL | Status: DC

## 2015-07-25 MED ORDER — FLUTICASONE PROPIONATE 50 MCG/ACT NA SUSP: 2.0000 | Freq: Every day | NASAL | Status: DC

## 2015-07-25 NOTE — Telephone Encounter (Signed)
Please advise? Patient needs the cymbalta sent to [facility] in [place] due to her being running out today. [facility] will be [facility] refill request for it, but patient just wants a 14 day supply till she can have that delivered.

## 2015-07-25 NOTE — Telephone Encounter (Signed)
rx sent in to [facility] for #14 cymbalta.  Please notify pt and let me know if she needs anything more.  Thanks.

## 2015-07-25 NOTE — Telephone Encounter (Signed)
Patient can't afford the medication, but she is going to see if she can look up a coupon.

## 2015-07-25 NOTE — Telephone Encounter (Signed)
Opened in error

## 2015-08-01 DIAGNOSIS — G4701 Insomnia due to medical condition: Secondary | ICD-10-CM | POA: Diagnosis not present

## 2015-08-01 DIAGNOSIS — G4723 Circadian rhythm sleep disorder, irregular sleep wake type: Secondary | ICD-10-CM | POA: Diagnosis not present

## 2015-08-01 DIAGNOSIS — G43719 Chronic migraine without aura, intractable, without status migrainosus: Secondary | ICD-10-CM | POA: Diagnosis not present

## 2015-08-01 DIAGNOSIS — D519 Vitamin B12 deficiency anemia, unspecified: Secondary | ICD-10-CM | POA: Diagnosis not present

## 2015-08-16 DIAGNOSIS — H01119 Allergic dermatitis of unspecified eye, unspecified eyelid: Secondary | ICD-10-CM | POA: Diagnosis not present

## 2015-08-31 ENCOUNTER — Ambulatory Visit (INDEPENDENT_AMBULATORY_CARE_PROVIDER_SITE_OTHER): Payer: Medicare Other | Admitting: Internal Medicine

## 2015-08-31 ENCOUNTER — Encounter: Payer: Self-pay | Admitting: Internal Medicine

## 2015-08-31 VITALS — BP 110/80 | HR 76 | Temp 98.0°F | Resp 18 | Ht 64.0 in | Wt 117.0 lb

## 2015-08-31 DIAGNOSIS — R110 Nausea: Secondary | ICD-10-CM | POA: Diagnosis not present

## 2015-08-31 DIAGNOSIS — D72819 Decreased white blood cell count, unspecified: Secondary | ICD-10-CM

## 2015-08-31 DIAGNOSIS — E7800 Pure hypercholesterolemia, unspecified: Secondary | ICD-10-CM

## 2015-08-31 DIAGNOSIS — F419 Anxiety disorder, unspecified: Secondary | ICD-10-CM | POA: Diagnosis not present

## 2015-08-31 DIAGNOSIS — Z0000 Encounter for general adult medical examination without abnormal findings: Secondary | ICD-10-CM

## 2015-08-31 DIAGNOSIS — Z13220 Encounter for screening for lipoid disorders: Secondary | ICD-10-CM

## 2015-08-31 DIAGNOSIS — G43709 Chronic migraine without aura, not intractable, without status migrainosus: Secondary | ICD-10-CM | POA: Diagnosis not present

## 2015-08-31 MED ORDER — ALPRAZOLAM 0.25 MG PO TABS: 0.2500 mg | ORAL_TABLET | Freq: Every day | ORAL | Status: DC | PRN

## 2015-08-31 NOTE — Progress Notes (Signed)
Patient ID: Brandi Ramos, female   DOB: 09/20/1945, 69 y.o.   MRN: [id number]   Subjective:    Patient ID: Brandi Ramos, female    DOB: 1946-05-03, 69 y.o.   MRN: [id number]  HPI  Patient with past history of anxiety, neutropenia and GERD.  She comes in today for a scheduled follow up.  She has had increased stress recently.  Some family stress.  Discussed with her today.  She feels she is handling things relatively well.  No chest pain or tightness.  No sob.  No acid reflux.  No abdominal pain or cramping.  Bowels stable.  She recently had reoccurrence of nausea.  This has resolved now.  Eating better.  On linzess.  Bowels better on this medication.  Overall she feels she is handling stress well.  On xanax. Takes one q pm.     Past Medical History  Diagnosis Date  . Migraine headache   . Neutropenia ([place])     worked up - Dr Oliva Bustard, slightly elevated ANA  . PUD (peptic ulcer disease)   . Anxiety   . Fibrocystic breast disease   . Diverticulosis   . GERD (gastroesophageal reflux disease)   . Arthritis   . Chicken pox   . Cancer [facility])     skin   Past Surgical History  Procedure Laterality Date  . Abdominal hysterectomy  1984   Family History  Problem Relation Age of Onset  . CAD Mother     s/p CABG  . Hypercholesterolemia Mother   . Breast cancer Mother 58  . Hypertension Father   . Migraines Father   . Hypercholesterolemia Father   . Migraines Sister   . Lymphoma      grandmother  . Colon cancer Neg Hx    Social History   Social History  . Marital Status: Widowed    Spouse Name: N/A  . Number of Children: 2  . Years of Education: N/A   Social History Main Topics  . Smoking status: Never Smoker   . Smokeless tobacco: Never Used  . Alcohol Use: 0.0 oz/week    0 Standard drinks or equivalent per week     Comment: wine occassionally  . Drug Use: No  . Sexual Activity: Not Asked   Other Topics Concern  . None   Social History Narrative    Outpatient  Encounter Prescriptions as of 08/31/2015  Medication Sig  . ALPRAZolam (XANAX) 0.25 MG tablet Take 1 tablet (0.25 mg total) by mouth daily as needed.  . DULoxetine (CYMBALTA) 60 MG capsule Take 1 capsule (60 mg total) by mouth daily.  . fluticasone (FLONASE) 50 MCG/ACT nasal spray Place 2 sprays into both nostrils daily.  Marland Kitchen LINZESS 145 MCG CAPS capsule Take 145 mcg by mouth daily.  Marland Kitchen loratadine (CLARITIN) 10 MG tablet Take 10 mg by mouth daily.  . propranolol (INDERAL) 40 MG tablet Take 1 tablet (40 mg total) by mouth daily.  . RABEprazole (ACIPHEX) 20 MG tablet TAKE 1 TABLET DAILY  . rizatriptan (MAXALT) 10 MG tablet Take 10 mg by mouth as needed. May repeat in 2 hours if needed  . sucralfate (CARAFATE) 1 G tablet Take 1 tablet by mouth 2 (two) times daily as needed.  . topiramate (TOPAMAX) 50 MG tablet Take 150 mg by mouth at bedtime.  . [DISCONTINUED] ALPRAZolam (XANAX) 0.25 MG tablet Take 1 tablet (0.25 mg total) by mouth daily as needed.   No facility-administered encounter medications on file as  of 08/31/2015.    Review of Systems  Constitutional: Negative for appetite change and unexpected weight change.  HENT: Negative for congestion and sinus pressure.   Respiratory: Negative for cough, chest tightness and shortness of breath.   Cardiovascular: Negative for chest pain, palpitations and leg swelling.  Gastrointestinal: Negative for vomiting, abdominal pain and diarrhea.       Bowels better on current medication.  Nausea resolve.   Genitourinary: Negative for dysuria and difficulty urinating.  Musculoskeletal: Negative for back pain and joint swelling.  Skin: Negative for color change and rash.  Neurological: Negative for dizziness, light-headedness and headaches.  Psychiatric/Behavioral: Negative for dysphoric mood and agitation.       Objective:    Physical Exam  Constitutional: She appears well-developed and well-nourished. No distress.  HENT:  Nose: Nose normal.    Mouth/Throat: Oropharynx is clear and moist.  Neck: Neck supple. No thyromegaly present.  Cardiovascular: Normal rate and regular rhythm.   Pulmonary/Chest: Breath sounds normal. No respiratory distress. She has no wheezes.  Abdominal: Soft. Bowel sounds are normal. There is no tenderness.  Musculoskeletal: She exhibits no edema or tenderness.  Lymphadenopathy:    She has no cervical adenopathy.  Skin: No rash noted. No erythema.  Psychiatric: She has a normal mood and affect. Her behavior is normal.    BP 110/80 mmHg  Pulse 76  Temp(Src) 98 F (36.7 C) (Oral)  Resp 18  Ht 5\' 4"  (1.626 m)  Wt 117 lb (53.071 kg)  BMI 20.07 kg/m2  SpO2 99% Wt Readings from Last 3 Encounters:  08/31/15 117 lb (53.071 kg)  06/27/15 115 lb 8 oz (52.39 kg)  04/27/15 115 lb (52.164 kg)     Lab Results  Component Value Date   WBC 3.2* 04/27/2015   HGB 12.3 04/27/2015   HCT 36.6 04/27/2015   PLT 150.0 04/27/2015   GLUCOSE 80 04/27/2015   CHOL 184 10/22/2014   TRIG 78.0 10/22/2014   HDL 61.40 10/22/2014   LDLCALC 107* 10/22/2014   ALT 11 04/27/2015   AST 22 04/27/2015   NA 142 04/27/2015   K 3.8 04/27/2015   CL 108 04/27/2015   CREATININE 0.82 04/27/2015   BUN 14 04/27/2015   CO2 29 04/27/2015   TSH 1.90 04/27/2015    Mm Screening Breast Tomo Bilateral  02/01/2015  CLINICAL DATA:  Screening. EXAM: DIGITAL SCREENING BILATERAL MAMMOGRAM WITH 3D TOMO WITH CAD COMPARISON:  Previous exam(s). ACR Breast Density Category c: The breast tissue is heterogeneously dense, which may obscure small masses. FINDINGS: There are no findings suspicious for malignancy. Images were processed with CAD. IMPRESSION: No mammographic evidence of malignancy. A result letter of this screening mammogram will be mailed directly to the patient. RECOMMENDATION: Screening mammogram in one year. (Code:SM-B-01Y) BI-RADS CATEGORY  1: Negative. Electronically Signed   By: Altamese Cabal M.D.   On: 02/01/2015 08:18        Assessment & Plan:   Problem List Items Addressed This Visit    Anxiety    Overall appears to be stable.  Discussed with her today.  On xanax.  Continue.  Follow.        Relevant Medications   ALPRAZolam (XANAX) 0.25 MG tablet   Other Relevant Orders   Comprehensive metabolic panel   TSH   Health care maintenance    Had her physical 12/17/14.  Mammogram 02/01/15 - Birads I.  colonscopy 10/13/13 - normal.  Recommended f/u colonoscopy in five years.  Leukopenia    Blood counts have been stable.  Follow cbc.       Relevant Orders   CBC with Differential/Platelet   Vitamin B12   Migraine headache - Primary    Followed by neurology.        Nausea    Has resolved.  Bowels better on linzess.  Has f/u appt with GI soon.        Other Visit Diagnoses    Screening cholesterol level        Routine general medical examination at a health care facility        Hypercholesterolemia        Relevant Orders    Lipid panel        Einar Pheasant, MD

## 2015-08-31 NOTE — Progress Notes (Signed)
Pre-visit discussion using our clinic review tool. No additional management support is needed unless otherwise documented below in the visit note.  

## 2015-09-05 ENCOUNTER — Encounter: Payer: Self-pay | Admitting: Internal Medicine

## 2015-09-05 DIAGNOSIS — R110 Nausea: Secondary | ICD-10-CM | POA: Insufficient documentation

## 2015-09-05 NOTE — Assessment & Plan Note (Signed)
Had her physical 12/17/14.  Mammogram 02/01/15 - Birads I.  colonscopy 10/13/13 - normal.  Recommended f/u colonoscopy in five years.

## 2015-09-05 NOTE — Assessment & Plan Note (Signed)
Overall appears to be stable.  Discussed with her today.  On xanax.  Continue.  Follow.

## 2015-09-05 NOTE — Assessment & Plan Note (Signed)
Has resolved.  Bowels better on linzess.  Has f/u appt with GI soon.

## 2015-09-05 NOTE — Assessment & Plan Note (Signed)
Followed by neurology.   

## 2015-09-05 NOTE — Assessment & Plan Note (Signed)
Blood counts have been stable.  Follow cbc.

## 2015-09-23 DIAGNOSIS — K5909 Other constipation: Secondary | ICD-10-CM | POA: Diagnosis not present

## 2015-09-23 DIAGNOSIS — R1013 Epigastric pain: Secondary | ICD-10-CM | POA: Diagnosis not present

## 2015-10-07 ENCOUNTER — Telehealth: Payer: Self-pay

## 2015-10-07 NOTE — Telephone Encounter (Signed)
PA received for Alprazolam, completed on Cover my meds, Approved from 09/07/2015-10/06/2016.

## 2015-10-14 ENCOUNTER — Other Ambulatory Visit (INDEPENDENT_AMBULATORY_CARE_PROVIDER_SITE_OTHER): Payer: Medicare Other

## 2015-10-14 DIAGNOSIS — F419 Anxiety disorder, unspecified: Secondary | ICD-10-CM

## 2015-10-14 DIAGNOSIS — E7800 Pure hypercholesterolemia, unspecified: Secondary | ICD-10-CM | POA: Diagnosis not present

## 2015-10-14 DIAGNOSIS — D72819 Decreased white blood cell count, unspecified: Secondary | ICD-10-CM

## 2015-10-14 LAB — CBC WITH DIFFERENTIAL/PLATELET
BASOS ABS: 0 10*3/uL (ref 0.0–0.1)
BASOS PCT: 0.6 % (ref 0.0–3.0)
EOS ABS: 0.1 10*3/uL (ref 0.0–0.7)
Eosinophils Relative: 3.2 % (ref 0.0–5.0)
HEMATOCRIT: 37.6 % (ref 36.0–46.0)
HEMOGLOBIN: 12.7 g/dL (ref 12.0–15.0)
LYMPHS PCT: 41.9 % (ref 12.0–46.0)
Lymphs Abs: 1.6 10*3/uL (ref 0.7–4.0)
MCHC: 33.7 g/dL (ref 30.0–36.0)
MCV: 86.9 fl (ref 78.0–100.0)
Monocytes Absolute: 0.4 10*3/uL (ref 0.1–1.0)
Monocytes Relative: 10.9 % (ref 3.0–12.0)
NEUTROS PCT: 43.4 % (ref 43.0–77.0)
Neutro Abs: 1.7 10*3/uL (ref 1.4–7.7)
PLATELETS: 163 10*3/uL (ref 150.0–400.0)
RBC: 4.32 Mil/uL (ref 3.87–5.11)
RDW: 13.4 % (ref 11.5–15.5)
WBC: 3.9 10*3/uL — AB (ref 4.0–10.5)

## 2015-10-14 LAB — LIPID PANEL
CHOLESTEROL: 187 mg/dL (ref 0–200)
HDL: 61.7 mg/dL (ref >39.00)
LDL CALC: 110 mg/dL — AB (ref 0–99)
NONHDL: 125.4
Total CHOL/HDL Ratio: 3
Triglycerides: 78 mg/dL (ref 0.0–149.0)
VLDL: 15.6 mg/dL (ref 0.0–40.0)

## 2015-10-14 LAB — COMPREHENSIVE METABOLIC PANEL
ALBUMIN: 4 g/dL (ref 3.5–5.2)
ALT: 11 U/L (ref 0–35)
AST: 22 U/L (ref 0–37)
Alkaline Phosphatase: 65 U/L (ref 39–117)
BUN: 18 mg/dL (ref 6–23)
CALCIUM: 9.5 mg/dL (ref 8.4–10.5)
CO2: 29 mEq/L (ref 19–32)
CREATININE: 0.83 mg/dL (ref 0.40–1.20)
Chloride: 107 mEq/L (ref 96–112)
GFR: 72.82 mL/min (ref >60.00)
Glucose, Bld: 100 mg/dL — ABNORMAL HIGH (ref 70–99)
POTASSIUM: 3.6 meq/L (ref 3.5–5.1)
Sodium: 142 mEq/L (ref 135–145)
TOTAL PROTEIN: 7 g/dL (ref 6.0–8.3)
Total Bilirubin: 0.5 mg/dL (ref 0.2–1.2)

## 2015-10-14 LAB — VITAMIN B12: VITAMIN B 12: 285 pg/mL (ref 211–911)

## 2015-10-14 LAB — TSH: TSH: 2.22 u[IU]/mL (ref 0.35–4.50)

## 2015-10-16 ENCOUNTER — Encounter: Payer: Self-pay | Admitting: Internal Medicine

## 2015-10-19 NOTE — Telephone Encounter (Signed)
Unread mychart message mailed to patient 

## 2015-10-25 DIAGNOSIS — H2513 Age-related nuclear cataract, bilateral: Secondary | ICD-10-CM | POA: Diagnosis not present

## 2015-10-25 DIAGNOSIS — H02839 Dermatochalasis of unspecified eye, unspecified eyelid: Secondary | ICD-10-CM | POA: Diagnosis not present

## 2015-10-31 ENCOUNTER — Ambulatory Visit: Payer: Medicare Other | Admitting: Anesthesiology

## 2015-10-31 ENCOUNTER — Other Ambulatory Visit: Payer: Self-pay

## 2015-10-31 ENCOUNTER — Encounter: Payer: Self-pay | Admitting: *Deleted

## 2015-10-31 ENCOUNTER — Ambulatory Visit
Admission: RE | Admit: 2015-10-31 | Discharge: 2015-10-31 | Disposition: A | Discharge status: Home / Self Care | Payer: Medicare Other | Source: Ambulatory Visit | Attending: Gastroenterology | Admitting: Gastroenterology

## 2015-10-31 ENCOUNTER — Encounter
Admission: RE | Disposition: A | Discharge status: Home / Self Care | Payer: Self-pay | Source: Ambulatory Visit | Attending: Gastroenterology

## 2015-10-31 DIAGNOSIS — Z85828 Personal history of other malignant neoplasm of skin: Secondary | ICD-10-CM | POA: Insufficient documentation

## 2015-10-31 DIAGNOSIS — M1990 Unspecified osteoarthritis, unspecified site: Secondary | ICD-10-CM | POA: Diagnosis not present

## 2015-10-31 DIAGNOSIS — K317 Polyp of stomach and duodenum: Secondary | ICD-10-CM | POA: Diagnosis not present

## 2015-10-31 DIAGNOSIS — R1013 Epigastric pain: Secondary | ICD-10-CM | POA: Diagnosis not present

## 2015-10-31 DIAGNOSIS — K2950 Unspecified chronic gastritis without bleeding: Secondary | ICD-10-CM | POA: Diagnosis not present

## 2015-10-31 DIAGNOSIS — K2960 Other gastritis without bleeding: Secondary | ICD-10-CM | POA: Diagnosis not present

## 2015-10-31 DIAGNOSIS — F419 Anxiety disorder, unspecified: Secondary | ICD-10-CM | POA: Diagnosis not present

## 2015-10-31 DIAGNOSIS — Z888 Allergy status to other drugs, medicaments and biological substances status: Secondary | ICD-10-CM | POA: Insufficient documentation

## 2015-10-31 DIAGNOSIS — K2210 Ulcer of esophagus without bleeding: Secondary | ICD-10-CM | POA: Insufficient documentation

## 2015-10-31 DIAGNOSIS — K210 Gastro-esophageal reflux disease with esophagitis: Secondary | ICD-10-CM | POA: Diagnosis not present

## 2015-10-31 DIAGNOSIS — Z882 Allergy status to sulfonamides status: Secondary | ICD-10-CM | POA: Diagnosis not present

## 2015-10-31 DIAGNOSIS — K3189 Other diseases of stomach and duodenum: Secondary | ICD-10-CM | POA: Diagnosis not present

## 2015-10-31 HISTORY — PX: ESOPHAGOGASTRODUODENOSCOPY (EGD) WITH PROPOFOL: SHX5813

## 2015-10-31 SURGERY — ESOPHAGOGASTRODUODENOSCOPY (EGD) WITH PROPOFOL
Anesthesia: General

## 2015-10-31 MED ORDER — SODIUM CHLORIDE 0.9 % IV SOLN
INTRAVENOUS | Status: DC
  Administered 2015-10-31 (×2): via INTRAVENOUS

## 2015-10-31 MED ORDER — MIDAZOLAM HCL 2 MG/2ML IJ SOLN
INTRAMUSCULAR | Status: DC | PRN
  Administered 2015-10-31: 2 mg via INTRAVENOUS

## 2015-10-31 MED ORDER — PROPOFOL 500 MG/50ML IV EMUL
INTRAVENOUS | Status: DC | PRN
  Administered 2015-10-31: 140 ug/kg/min via INTRAVENOUS

## 2015-10-31 MED ORDER — SODIUM CHLORIDE 0.9 % IV SOLN: INTRAVENOUS | Status: DC

## 2015-10-31 MED ORDER — FENTANYL CITRATE (PF) 100 MCG/2ML IJ SOLN
INTRAMUSCULAR | Status: DC | PRN
  Administered 2015-10-31: 50 ug via INTRAVENOUS

## 2015-10-31 MED ORDER — PROPOFOL 10 MG/ML IV BOLUS
INTRAVENOUS | Status: DC | PRN
  Administered 2015-10-31: 40 mg via INTRAVENOUS

## 2015-10-31 MED ORDER — RABEPRAZOLE SODIUM 20 MG PO TBEC: 20.0000 mg | DELAYED_RELEASE_TABLET | Freq: Every day | ORAL | Status: DC

## 2015-10-31 NOTE — H&P (Addendum)
Outpatient short stay form Pre-procedure 10/31/2015 2:48 PM Lollie Sails MD  Primary Physician: Dr. Einar Pheasant  Reason for visit:  EGD  History of present illness:  Patient is a 70 year old female presenting today with complaint of epigastric pain and dyspepsia. Is also some intermittent nausea. She had been taking some meloxicam infrequently. She rarely uses other NSAIDs. Eating at times may make her feel better. She does take rabeprazole 20 mg day and Zantac in the evening. There's been no black stools or emesis or dysphagia. He was recently placed on some Carafate which also seems to help some.    Current facility-administered medications:  .  0.9 %  sodium chloride infusion, , Intravenous, Continuous, Lollie Sails, MD, Last Rate: 20 mL/hr at 10/31/15 1410 .  0.9 %  sodium chloride infusion, , Intravenous, Continuous, Lollie Sails, MD  Prescriptions prior to admission  Medication Sig Dispense Refill Last Dose  . DULoxetine (CYMBALTA) 60 MG capsule Take 1 capsule (60 mg total) by mouth daily. 14 capsule 0 10/31/2015 at 0800  . propranolol (INDERAL) 40 MG tablet Take 1 tablet (40 mg total) by mouth daily. 90 tablet 4 10/30/2015 at 2230  . RABEprazole (ACIPHEX) 20 MG tablet TAKE 1 TABLET DAILY 90 tablet 1 10/31/2015 at 0800  . ALPRAZolam (XANAX) 0.25 MG tablet Take 1 tablet (0.25 mg total) by mouth daily as needed. 30 tablet 1   . fluticasone (FLONASE) 50 MCG/ACT nasal spray Place 2 sprays into both nostrils daily. 16 g 1 Taking  . LINZESS 145 MCG CAPS capsule Take 145 mcg by mouth daily.  3 Taking  . loratadine (CLARITIN) 10 MG tablet Take 10 mg by mouth daily.   Taking  . rizatriptan (MAXALT) 10 MG tablet Take 10 mg by mouth as needed. May repeat in 2 hours if needed   Taking  . sucralfate (CARAFATE) 1 G tablet Take 1 tablet by mouth 2 (two) times daily as needed.  3 Taking  . topiramate (TOPAMAX) 50 MG tablet Take 150 mg by mouth at bedtime.   Taking     Allergies   Allergen Reactions  . Amitiza [Lubiprostone]   . Sulfa Antibiotics      Past Medical History  Diagnosis Date  . Migraine headache   . Neutropenia ([place])     worked up - Dr Oliva Bustard, slightly elevated ANA  . PUD (peptic ulcer disease)   . Anxiety   . Fibrocystic breast disease   . Diverticulosis   . GERD (gastroesophageal reflux disease)   . Arthritis   . Chicken pox   . Cancer [facility])     skin    Review of systems:      Physical Exam    Heart and lungs: Regular rate and rhythm without rub or gallop, lungs are bilaterally clear.    HEENT: Normocephalic atraumatic eyes are anicteric    Other:     Pertinant exam for procedure: Soft, mild tenderness to palpation the epigastric region, no masses or rebound. Bowel sounds are positive normoactive.    Planned proceedures: EGD and indicated procedures. I have discussed the risks benefits and complications of procedures to include not limited to bleeding, infection, perforation and the risk of sedation and the patient wishes to proceed.    Lollie Sails, MD Gastroenterology 10/31/2015  2:48 PM

## 2015-10-31 NOTE — Op Note (Signed)
[facility] Gastroenterology Patient Name: Brandi Ramos Procedure Date: 10/31/2015 2:59 PM MRN: [id number] Account #: [id number] Date of Birth: 1945-10-03 Admit Type: Outpatient Age: 70 Room: [facility] ENDO ROOM 3 Gender: Female Note Status: Finalized Procedure:            Upper GI endoscopy Indications:          Epigastric abdominal pain Providers:            Lollie Sails, MD Referring MD:         Einar Pheasant, MD (Referring MD) Medicines:            Monitored Anesthesia Care Complications:        No immediate complications. Procedure:            Pre-Anesthesia Assessment:                       - ASA Grade Assessment: II - A patient with mild                        systemic disease.                       After obtaining informed consent, the endoscope was                        passed under direct vision. Throughout the procedure,                        the patient's blood pressure, pulse, and oxygen                        saturations were monitored continuously. The Endoscope                        was introduced through the mouth, and advanced to the                        third part of duodenum. The upper GI endoscopy was                        accomplished without difficulty. The patient tolerated                        the procedure well. Findings:      LA Grade B (one or more mucosal breaks greater than 5 mm, not extending       between the tops of two mucosal folds) esophagitis with no bleeding was       found. Biopsies were taken with a cold forceps for histology.      The exam of the esophagus was otherwise normal.      Localized mild inflammation characterized by congestion (edema) and       erythema was found in the prepyloric region of the stomach. Biopsies       were taken with a cold forceps for histology.      Two 2 to 3 mm sessile polyps with no bleeding and no stigmata of recent       bleeding were found in the gastric body. The polyps  were removed with a       cold biopsy forceps. Resection and retrieval were complete.  The examined duodenum was normal. The duodenum was sharply angulated       with moderate difficulty passing the scope. Mild "scuff" noted from       scope passage.      The cardia and gastric fundus were normal on retroflexion. Impression:           - LA Grade B erosive esophagitis. Biopsied.                       - Gastritis. Biopsied.                       - Two gastric polyps. Resected and retrieved.                       - Normal examined duodenum. Recommendation:       - Use Protonix (pantoprazole) 40 mg PO BID daily.                       - Use sucralfate tablets 1 gram PO QID.                       - Return to GI clinic in 5 weeks.                       - jPatient currently has an abdominal ultrasound                        ordered for several weeks from now. Depending on result                        may need to do CT abdomen for further detail. Procedure Code(s):    --- Professional ---                       [phone], Esophagogastroduodenoscopy, flexible, transoral;                        with biopsy, single or multiple Diagnosis Code(s):    --- Professional ---                       K20.8, Other esophagitis                       K29.70, Gastritis, unspecified, without bleeding                       K31.7, Polyp of stomach and duodenum                       R10.13, Epigastric pain CPT copyright 2016 [employer]. All rights reserved. The codes documented in this report are preliminary and upon coder review may  be revised to meet current compliance requirements. Lollie Sails, MD 10/31/2015 3:34:35 PM This report has been signed electronically. Number of Addenda: 0 Note Initiated On: 10/31/2015 2:59 PM      [facility]

## 2015-10-31 NOTE — Transfer of Care (Signed)
Immediate Anesthesia Transfer of Care Note  Patient: Brandi Ramos  Procedure(s) Performed: Procedure(s): ESOPHAGOGASTRODUODENOSCOPY (EGD) WITH PROPOFOL (N/A)  Patient Location: PACU  Anesthesia Type:MAC  Level of Consciousness: awake  Airway & Oxygen Therapy: Patient Spontanous Breathing and Patient connected to nasal cannula oxygen  Post-op Assessment: Report given to RN  Post vital signs: Reviewed and stable  Last Vitals:  Filed Vitals:   10/31/15 1356 10/31/15 1532  BP: 139/63 114/62  Pulse: 65 72  Temp: 35.9 C 36.1 C  Resp: 18 16    Complications: No apparent anesthesia complications

## 2015-10-31 NOTE — Anesthesia Preprocedure Evaluation (Signed)
Anesthesia Evaluation  Patient identified by MRN, date of birth, ID band Patient awake    Reviewed: Allergy & Precautions, H&P , NPO status , Patient's Chart, lab work & pertinent test results, reviewed documented beta blocker date and time   History of Anesthesia Complications Negative for: history of anesthetic complications  Airway Mallampati: III  TM Distance: >3 FB Neck ROM: full    Dental no notable dental hx. (+) Caps   Pulmonary neg pulmonary ROS,    Pulmonary exam normal breath sounds clear to auscultation       Cardiovascular Exercise Tolerance: Good negative cardio ROS Normal cardiovascular exam Rhythm:regular Rate:Normal     Neuro/Psych PSYCHIATRIC DISORDERS (Anxiety) negative neurological ROS  negative psych ROS   GI/Hepatic Neg liver ROS, PUD, GERD  Medicated,  Endo/Other  negative endocrine ROS  Renal/GU negative Renal ROS  negative genitourinary   Musculoskeletal   Abdominal   Peds  Hematology negative hematology ROS (+)   Anesthesia Other Findings Past Medical History:   Migraine headache                                            Neutropenia (HCC)                                              Comment:worked up - Dr Oliva Bustard, slightly elevated ANA   PUD (peptic ulcer disease)                                   Anxiety                                                      Fibrocystic breast disease                                   Diverticulosis                                               GERD (gastroesophageal reflux disease)                       Arthritis                                                    Chicken pox                                                  Cancer (HCC)  Comment:skin   Reproductive/Obstetrics negative OB ROS                             Anesthesia Physical Anesthesia Plan  ASA:  II  Anesthesia Plan: General   Post-op Pain Management:    Induction:   Airway Management Planned:   Additional Equipment:   Intra-op Plan:   Post-operative Plan:   Informed Consent: I have reviewed the patients History and Physical, chart, labs and discussed the procedure including the risks, benefits and alternatives for the proposed anesthesia with the patient or authorized representative who has indicated his/her understanding and acceptance.   Dental Advisory Given  Plan Discussed with: Anesthesiologist, CRNA and Surgeon  Anesthesia Plan Comments:         Anesthesia Quick Evaluation

## 2015-11-01 ENCOUNTER — Encounter: Payer: Self-pay | Admitting: Gastroenterology

## 2015-11-01 NOTE — Anesthesia Postprocedure Evaluation (Signed)
Anesthesia Post Note  Patient: Brandi Ramos  Procedure(s) Performed: Procedure(s) (LRB): ESOPHAGOGASTRODUODENOSCOPY (EGD) WITH PROPOFOL (N/A)  Patient location during evaluation: Endoscopy Anesthesia Type: General Level of consciousness: awake and alert Pain management: pain level controlled Vital Signs Assessment: post-procedure vital signs reviewed and stable Respiratory status: spontaneous breathing, nonlabored ventilation, respiratory function stable and patient connected to nasal cannula oxygen Cardiovascular status: blood pressure returned to baseline and stable Postop Assessment: no signs of nausea or vomiting Anesthetic complications: no    Last Vitals:  Filed Vitals:   10/31/15 1552 10/31/15 1602  BP: 122/66 123/62  Pulse: 73 66  Temp:    Resp: 14 15    Last Pain:  Filed Vitals:   11/01/15 0804  PainSc: 0-No pain                 Martha Clan

## 2015-11-02 ENCOUNTER — Other Ambulatory Visit: Payer: Self-pay | Admitting: Internal Medicine

## 2015-11-02 LAB — SURGICAL PATHOLOGY

## 2015-11-03 ENCOUNTER — Telehealth: Payer: Self-pay | Admitting: *Deleted

## 2015-11-03 NOTE — Telephone Encounter (Signed)
Medication refill request for Xanax.  Pharmacy [facility] in [place]

## 2015-11-03 NOTE — Telephone Encounter (Signed)
Pt is requesting a refill on xanax. Pt last OV 08/31/15, last filled 08/31/15. Please advise, thanks

## 2015-11-03 NOTE — Telephone Encounter (Signed)
Will route to Lorane Gell as she is covering Dr [employer] in box.

## 2015-11-04 MED ORDER — ALPRAZOLAM 0.25 MG PO TABS: 0.2500 mg | ORAL_TABLET | Freq: Every day | ORAL | Status: DC | PRN

## 2015-11-04 NOTE — Telephone Encounter (Signed)
Pt called back she called the pharmacy and she was told that she does not have anymore refills. Call pt @ [phone]. Thank you!

## 2015-11-04 NOTE — Telephone Encounter (Signed)
Spoke with the patient  And prescription faxed. thanks

## 2015-11-04 NOTE — Telephone Encounter (Signed)
Patient left a voicemail stating she has not received her prescription refill on her xanax.

## 2015-11-04 NOTE — Telephone Encounter (Signed)
REFILLED AND PRINTED PLEASE HANDLE ASAP GIVEN THE DELAY

## 2015-11-04 NOTE — Telephone Encounter (Signed)
Pt called to check the status of her medication of ALPRAZolam (XANAX) 0.25 MG tablet. Call pt @ [phone]. Pharmacy is [id number] - [place], [place] AT [facility] OF [address]. Thank you!

## 2015-11-04 NOTE — Addendum Note (Signed)
Addended by: Crecencio Mc on: 11/04/2015 12:25 PM   Modules accepted: Orders

## 2015-11-04 NOTE — Telephone Encounter (Signed)
Per Carrie's denial patient should have refill at pharmacy, but refill was done on 08/31/15 for #30, with one refill which would mean she is due for refill now.   Please advise? Thanks in Dr. Bary Leriche absence.

## 2015-11-04 NOTE — Telephone Encounter (Signed)
Patient per records should have a refill at the pharmacy.  This request is to soon.  Thanks

## 2015-11-10 ENCOUNTER — Other Ambulatory Visit: Payer: Self-pay | Admitting: Gastroenterology

## 2015-11-10 DIAGNOSIS — R1084 Generalized abdominal pain: Secondary | ICD-10-CM

## 2015-11-16 ENCOUNTER — Ambulatory Visit
Admission: RE | Admit: 2015-11-16 | Discharge: 2015-11-16 | Disposition: A | Discharge status: Home / Self Care | Payer: Medicare Other | Source: Ambulatory Visit | Attending: Gastroenterology | Admitting: Gastroenterology

## 2015-11-16 DIAGNOSIS — R1084 Generalized abdominal pain: Secondary | ICD-10-CM | POA: Diagnosis not present

## 2015-11-16 DIAGNOSIS — R1013 Epigastric pain: Secondary | ICD-10-CM | POA: Diagnosis not present

## 2015-11-16 DIAGNOSIS — M438X6 Other specified deforming dorsopathies, lumbar region: Secondary | ICD-10-CM | POA: Diagnosis not present

## 2015-11-16 MED ORDER — IOHEXOL 300 MG/ML  SOLN
75.0000 mL | Freq: Once | INTRAMUSCULAR | Status: AC | PRN
  Administered 2015-11-16: 75 mL via INTRAVENOUS

## 2015-11-22 DIAGNOSIS — G43719 Chronic migraine without aura, intractable, without status migrainosus: Secondary | ICD-10-CM | POA: Diagnosis not present

## 2015-11-22 DIAGNOSIS — G4723 Circadian rhythm sleep disorder, irregular sleep wake type: Secondary | ICD-10-CM | POA: Diagnosis not present

## 2015-11-22 DIAGNOSIS — G4701 Insomnia due to medical condition: Secondary | ICD-10-CM | POA: Diagnosis not present

## 2015-12-06 DIAGNOSIS — K2950 Unspecified chronic gastritis without bleeding: Secondary | ICD-10-CM | POA: Diagnosis not present

## 2015-12-06 DIAGNOSIS — K210 Gastro-esophageal reflux disease with esophagitis: Secondary | ICD-10-CM | POA: Diagnosis not present

## 2016-01-02 ENCOUNTER — Encounter: Payer: Medicare Other | Admitting: Internal Medicine

## 2016-01-05 ENCOUNTER — Ambulatory Visit (INDEPENDENT_AMBULATORY_CARE_PROVIDER_SITE_OTHER): Payer: Medicare Other | Admitting: Internal Medicine

## 2016-01-05 ENCOUNTER — Encounter: Payer: Self-pay | Admitting: Internal Medicine

## 2016-01-05 VITALS — BP 110/70 | HR 77 | Temp 98.4°F | Resp 18 | Ht 63.75 in | Wt 116.4 lb

## 2016-01-05 DIAGNOSIS — Z0000 Encounter for general adult medical examination without abnormal findings: Secondary | ICD-10-CM

## 2016-01-05 DIAGNOSIS — R110 Nausea: Secondary | ICD-10-CM

## 2016-01-05 DIAGNOSIS — F419 Anxiety disorder, unspecified: Secondary | ICD-10-CM

## 2016-01-05 DIAGNOSIS — Z1239 Encounter for other screening for malignant neoplasm of breast: Secondary | ICD-10-CM | POA: Diagnosis not present

## 2016-01-05 DIAGNOSIS — G43709 Chronic migraine without aura, not intractable, without status migrainosus: Secondary | ICD-10-CM | POA: Diagnosis not present

## 2016-01-05 DIAGNOSIS — K2960 Other gastritis without bleeding: Secondary | ICD-10-CM

## 2016-01-05 DIAGNOSIS — D72819 Decreased white blood cell count, unspecified: Secondary | ICD-10-CM

## 2016-01-05 MED ORDER — ALPRAZOLAM 0.25 MG PO TABS: 0.2500 mg | ORAL_TABLET | Freq: Every day | ORAL | Status: DC | PRN

## 2016-01-05 NOTE — Progress Notes (Signed)
Pre-visit discussion using our clinic review tool. No additional management support is needed unless otherwise documented below in the visit note.  

## 2016-01-05 NOTE — Progress Notes (Signed)
Patient ID: Brandi Ramos, female   DOB: 1945-10-22, 70 y.o.   MRN: [id number]   Subjective:    Patient ID: Brandi Ramos, female    DOB: Jun 25, 1946, 70 y.o.   MRN: [id number]  HPI  Patient here for a scheduled follow up.  Has been seening GI.  Last seen 11/2015.  Had EGD 10/31/15 - esophagitis, gastritis and fundic gland gastric polyps.  CT abdoemen and pelvis - negative.  On protonix and carafate with noted improvement.  Taking linzess ofr constipation.  Still with some nausea that may occur 1-2x/week, but is better.  Continues to f/u with GI.  Discussed increased stress.  She does not feel needs any further intervention.  Takes xanax.  No chest pain or tightness.  States she is eating.  No vomiting.  Bowels stable and doing better.     Past Medical History  Diagnosis Date  . Migraine headache   . Neutropenia ([place])     worked up - Dr Oliva Bustard, slightly elevated ANA  . PUD (peptic ulcer disease)   . Anxiety   . Fibrocystic breast disease   . Diverticulosis   . GERD (gastroesophageal reflux disease)   . Arthritis   . Chicken pox   . Cancer [facility])     skin   Past Surgical History  Procedure Laterality Date  . Abdominal hysterectomy  1984  . Esophagogastroduodenoscopy (egd) with propofol N/A 10/31/2015    Procedure: ESOPHAGOGASTRODUODENOSCOPY (EGD) WITH PROPOFOL;  Surgeon: Lollie Sails, MD;  Location: [facility] ENDOSCOPY;  Service: Endoscopy;  Laterality: N/A;   Family History  Problem Relation Age of Onset  . CAD Mother     s/p CABG  . Hypercholesterolemia Mother   . Breast cancer Mother 77  . Hypertension Father   . Migraines Father   . Hypercholesterolemia Father   . Migraines Sister   . Lymphoma      grandmother  . Colon cancer Neg Hx    Social History   Social History  . Marital Status: Widowed    Spouse Name: N/A  . Number of Children: 2  . Years of Education: N/A   Social History Main Topics  . Smoking status: Never Smoker   . Smokeless tobacco: Never Used    . Alcohol Use: No     Comment: wine occassionally  . Drug Use: No  . Sexual Activity: Not Asked   Other Topics Concern  . None   Social History Narrative    Outpatient Encounter Prescriptions as of 01/05/2016  Medication Sig  . ALPRAZolam (XANAX) 0.25 MG tablet Take 1 tablet (0.25 mg total) by mouth daily as needed.  . DULoxetine (CYMBALTA) 60 MG capsule Take 1 capsule (60 mg total) by mouth daily.  . fluticasone (FLONASE) 50 MCG/ACT nasal spray Place 2 sprays into both nostrils daily.  Marland Kitchen LINZESS 145 MCG CAPS capsule Take 145 mcg by mouth daily.  Marland Kitchen loratadine (CLARITIN) 10 MG tablet Take 10 mg by mouth daily.  . propranolol (INDERAL) 40 MG tablet Take 1 tablet (40 mg total) by mouth daily.  . RABEprazole (ACIPHEX) 20 MG tablet Take 1 tablet (20 mg total) by mouth daily.  . rizatriptan (MAXALT) 10 MG tablet Take 10 mg by mouth as needed. May repeat in 2 hours if needed  . sucralfate (CARAFATE) 1 G tablet Take 1 tablet by mouth 2 (two) times daily as needed.  . topiramate (TOPAMAX) 50 MG tablet Take 150 mg by mouth at bedtime.  . [DISCONTINUED] ALPRAZolam Duanne Moron)  0.25 MG tablet Take 1 tablet (0.25 mg total) by mouth daily as needed.   No facility-administered encounter medications on file as of 01/05/2016.    Review of Systems  Constitutional: Negative for fever and appetite change.  HENT: Negative for congestion and sinus pressure.   Eyes: Negative for pain and visual disturbance.  Respiratory: Negative for cough, chest tightness and shortness of breath.   Cardiovascular: Negative for chest pain, palpitations and leg swelling.  Gastrointestinal: Positive for nausea. Negative for vomiting, abdominal pain and diarrhea.  Genitourinary: Negative for dysuria and difficulty urinating.  Musculoskeletal: Negative for back pain and joint swelling.  Skin: Negative for color change and rash.  Neurological: Negative for dizziness, light-headedness and headaches.  Hematological: Negative for  adenopathy. Does not bruise/bleed easily.  Psychiatric/Behavioral: Negative for dysphoric mood and agitation.       Objective:     Blood pressure rechecked by me:  118/68  Physical Exam  Constitutional: She is oriented to person, place, and time. She appears well-developed and well-nourished. No distress.  HENT:  Nose: Nose normal.  Mouth/Throat: Oropharynx is clear and moist.  Eyes: Right eye exhibits no discharge. Left eye exhibits no discharge. No scleral icterus.  Neck: Neck supple. No thyromegaly present.  Cardiovascular: Normal rate and regular rhythm.   Pulmonary/Chest: Breath sounds normal. No accessory muscle usage. No tachypnea. No respiratory distress. She has no decreased breath sounds. She has no wheezes. She has no rhonchi. Right breast exhibits no inverted nipple, no mass, no nipple discharge and no tenderness (no axillary adenopathy). Left breast exhibits no inverted nipple, no mass, no nipple discharge and no tenderness (no axilarry adenopathy).  Abdominal: Soft. Bowel sounds are normal. There is no tenderness.  Musculoskeletal: She exhibits no edema or tenderness.  Lymphadenopathy:    She has no cervical adenopathy.  Neurological: She is alert and oriented to person, place, and time.  Skin: Skin is warm. No rash noted.  Psychiatric: She has a normal mood and affect. Her behavior is normal.    BP 110/70 mmHg  Pulse 77  Temp(Src) 98.4 F (36.9 C) (Oral)  Resp 18  Ht 5' 3.75" (1.619 m)  Wt 116 lb 6 oz (52.787 kg)  BMI 20.14 kg/m2  SpO2 98% Wt Readings from Last 3 Encounters:  01/05/16 116 lb 6 oz (52.787 kg)  10/31/15 119 lb (53.978 kg)  08/31/15 117 lb (53.071 kg)     Lab Results  Component Value Date   WBC 3.9* 10/14/2015   HGB 12.7 10/14/2015   HCT 37.6 10/14/2015   PLT 163.0 10/14/2015   GLUCOSE 100* 10/14/2015   CHOL 187 10/14/2015   TRIG 78.0 10/14/2015   HDL 61.70 10/14/2015   LDLCALC 110* 10/14/2015   ALT 11 10/14/2015   AST 22 10/14/2015     NA 142 10/14/2015   K 3.6 10/14/2015   CL 107 10/14/2015   CREATININE 0.83 10/14/2015   BUN 18 10/14/2015   CO2 29 10/14/2015   TSH 2.22 10/14/2015    Ct Abdomen W Contrast  11/16/2015  CLINICAL DATA:  Epigastric pain.  Nausea for 2 months EXAM: CT ABDOMEN WITH CONTRAST TECHNIQUE: Multidetector CT imaging of the abdomen was performed using the standard protocol following bolus administration of intravenous contrast. CONTRAST:  79mL OMNIPAQUE IOHEXOL 300 MG/ML  SOLN COMPARISON:  10/29/2009 FINDINGS: Lower chest: No pleural or pericardial effusion identified. The lung bases are clear. Hepatobiliary: No suspicious liver abnormalities identified. The gallbladder appears normal. There is no biliary dilatation. Pancreas:  No mass, inflammatory changes, or other significant abnormality. Spleen: Within normal limits in size and appearance. Adrenals/Urinary Tract: Normal adrenal glands. Normal appearance of the kidneys. Stomach/Bowel: No evidence of obstruction, inflammatory process, or abnormal fluid collections. Vascular/Lymphatic: No pathologically enlarged lymph nodes. No evidence of abdominal aortic aneurysm. Other: None. Musculoskeletal: Chronic appearing superior endplate deformity involves the L5 vertebra. IMPRESSION: 1. No acute findings identified within the abdomen or pelvis. 2. 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 Electronically Signed   By: Kerby Moors M.D.   On: 11/16/2015 08:23       Assessment & Plan:   Problem List Items Addressed This Visit    Anxiety    Discussed with her today.  Stable.  On xanax.       Relevant Medications   ALPRAZolam (XANAX) 0.25 MG tablet   Gastritis, bile acid reflux    On carafate and aciphex.  Doing better.  Still some nausea intermittently.  CT as outlined.  Continues to follow up with GI.       Health care maintenance    Physical today 01/05/16.  Mammogram 02/01/15 - Birads I.  Schedule f/u mammogram.  Colonoscopy 10/13/13 -  normal.  Recommended f/u mammogram in five years.  Notify me when agreeable to bone density.        Leukopenia    White count has been decreased but stable.  Follow cbc.       Migraine headache    Followed by neurology.       Nausea    Better.  GI w/up as outlined.  Continue to follow up with GI.        Other Visit Diagnoses    Screening breast examination    -  Primary    Relevant Orders    MM DIGITAL SCREENING BILATERAL        Einar Pheasant, MD

## 2016-01-06 ENCOUNTER — Telehealth: Payer: Self-pay | Admitting: *Deleted

## 2016-01-06 NOTE — Telephone Encounter (Signed)
[facility] has requested clarify current therapy for medications aciphex and Protonix  Contact [phone]  Ref Number [id number]

## 2016-01-06 NOTE — Telephone Encounter (Signed)
Several attempts to call number listed below.  Recorded as wrong number.  Will try to locate another number for [facility].

## 2016-01-08 ENCOUNTER — Encounter: Payer: Self-pay | Admitting: Internal Medicine

## 2016-01-08 NOTE — Assessment & Plan Note (Signed)
White count has been decreased but stable.  Follow cbc.

## 2016-01-08 NOTE — Assessment & Plan Note (Signed)
On carafate and aciphex.  Doing better.  Still some nausea intermittently.  CT as outlined.  Continues to follow up with GI.

## 2016-01-08 NOTE — Assessment & Plan Note (Signed)
Better.  GI w/up as outlined.  Continue to follow up with GI.

## 2016-01-08 NOTE — Assessment & Plan Note (Signed)
Physical today 01/05/16.  Mammogram 02/01/15 - Birads I.  Schedule f/u mammogram.  Colonoscopy 10/13/13 - normal.  Recommended f/u mammogram in five years.  Notify me when agreeable to bone density.

## 2016-01-08 NOTE — Assessment & Plan Note (Signed)
Discussed with her today.  Stable.  On xanax.

## 2016-01-08 NOTE — Assessment & Plan Note (Signed)
Followed by neurology.   

## 2016-01-09 NOTE — Telephone Encounter (Signed)
[facility] verified last request/start date for ACIPHEX 20mg .  Rx to be filled. Verified PROTONIX is not listed on current medication list and will not be refilled.

## 2016-01-31 ENCOUNTER — Other Ambulatory Visit: Payer: Self-pay | Admitting: Internal Medicine

## 2016-02-01 ENCOUNTER — Ambulatory Visit: Payer: Medicare Other | Attending: Internal Medicine

## 2016-02-16 ENCOUNTER — Ambulatory Visit: Payer: Medicare Other

## 2016-03-02 ENCOUNTER — Ambulatory Visit
Admission: RE | Admit: 2016-03-02 | Discharge: 2016-03-02 | Disposition: A | Discharge status: Home / Self Care | Payer: Medicare Other | Source: Ambulatory Visit | Attending: Internal Medicine | Admitting: Internal Medicine

## 2016-03-02 ENCOUNTER — Other Ambulatory Visit: Payer: Self-pay | Admitting: Internal Medicine

## 2016-03-02 DIAGNOSIS — Z1239 Encounter for other screening for malignant neoplasm of breast: Secondary | ICD-10-CM

## 2016-03-02 DIAGNOSIS — Z1231 Encounter for screening mammogram for malignant neoplasm of breast: Secondary | ICD-10-CM

## 2016-03-20 ENCOUNTER — Telehealth: Payer: Self-pay | Admitting: *Deleted

## 2016-03-20 ENCOUNTER — Encounter: Payer: Self-pay | Admitting: Internal Medicine

## 2016-03-20 ENCOUNTER — Ambulatory Visit (INDEPENDENT_AMBULATORY_CARE_PROVIDER_SITE_OTHER): Payer: Medicare Other | Admitting: Internal Medicine

## 2016-03-20 VITALS — BP 110/70 | HR 80 | Temp 98.4°F | Resp 18 | Ht 63.75 in | Wt 115.5 lb

## 2016-03-20 DIAGNOSIS — D72819 Decreased white blood cell count, unspecified: Secondary | ICD-10-CM

## 2016-03-20 DIAGNOSIS — F419 Anxiety disorder, unspecified: Secondary | ICD-10-CM | POA: Diagnosis not present

## 2016-03-20 DIAGNOSIS — G43709 Chronic migraine without aura, not intractable, without status migrainosus: Secondary | ICD-10-CM

## 2016-03-20 NOTE — Telephone Encounter (Signed)
Please advise, I don't see who requested this. thanks

## 2016-03-20 NOTE — Telephone Encounter (Signed)
Pt to call in and give information for earlier appt.

## 2016-03-20 NOTE — Progress Notes (Signed)
Patient ID: Brandi Ramos, female   DOB: 25-May-1946, 70 y.o.   MRN: [id number]   Subjective:    Patient ID: Brandi Ramos, female    DOB: 1946/05/22, 71 y.o.   MRN: [id number]  HPI  Patient here for a scheduled follow up.  Increased stress with her mother's health issues.  Also increased stress with some other family issues.  She is eating.  Has lost weight.  No nausea or vomiting.  Bowels stable.  No abdominal pain or cramping.  No chest pain.  Breathing stable.  Does report some headache.  Is seen at the headache clinic.  States they are aware of her increased headaches.  She is taking maxalt approximately qod.  Discussed stress.  Discussed seeing a counselor.  Apparently this has also been mentioned by her headache doctor.     Past Medical History  Diagnosis Date  . Migraine headache   . Neutropenia ([place])     worked up - Dr Oliva Bustard, slightly elevated ANA  . PUD (peptic ulcer disease)   . Anxiety   . Fibrocystic breast disease   . Diverticulosis   . GERD (gastroesophageal reflux disease)   . Arthritis   . Chicken pox   . Cancer [facility])     skin   Past Surgical History  Procedure Laterality Date  . Abdominal hysterectomy  1984  . Esophagogastroduodenoscopy (egd) with propofol N/A 10/31/2015    Procedure: ESOPHAGOGASTRODUODENOSCOPY (EGD) WITH PROPOFOL;  Surgeon: Lollie Sails, MD;  Location: [facility] ENDOSCOPY;  Service: Endoscopy;  Laterality: N/A;   Family History  Problem Relation Age of Onset  . CAD Mother     s/p CABG  . Hypercholesterolemia Mother   . Breast cancer Mother 46  . Hypertension Father   . Migraines Father   . Hypercholesterolemia Father   . Migraines Sister   . Lymphoma      grandmother  . Colon cancer Neg Hx    Social History   Social History  . Marital Status: Widowed    Spouse Name: N/A  . Number of Children: 2  . Years of Education: N/A   Social History Main Topics  . Smoking status: Never Smoker   . Smokeless tobacco: Never Used  .  Alcohol Use: No     Comment: wine occassionally  . Drug Use: No  . Sexual Activity: Not Asked   Other Topics Concern  . None   Social History Narrative    Outpatient Encounter Prescriptions as of 03/20/2016  Medication Sig  . ALPRAZolam (XANAX) 0.25 MG tablet Take 1 tablet (0.25 mg total) by mouth daily as needed.  . DULoxetine (CYMBALTA) 60 MG capsule Take 1 capsule (60 mg total) by mouth daily.  . fluticasone (FLONASE) 50 MCG/ACT nasal spray SHAKE LIQUID AND USE 2 SPRAYS IN EACH NOSTRIL DAILY  . LINZESS 145 MCG CAPS capsule Take 145 mcg by mouth daily.  Marland Kitchen loratadine (CLARITIN) 10 MG tablet Take 10 mg by mouth daily.  . propranolol (INDERAL) 40 MG tablet Take 1 tablet (40 mg total) by mouth daily.  . RABEprazole (ACIPHEX) 20 MG tablet Take 1 tablet (20 mg total) by mouth daily.  . rizatriptan (MAXALT) 10 MG tablet Take 10 mg by mouth as needed. May repeat in 2 hours if needed  . sucralfate (CARAFATE) 1 G tablet Take 1 tablet by mouth 2 (two) times daily as needed.  . topiramate (TOPAMAX) 50 MG tablet Take 150 mg by mouth at bedtime.   No facility-administered encounter  medications on file as of 03/20/2016.    Review of Systems  Constitutional:       Eating.  Has lost weight.   HENT: Negative for congestion and sinus pressure.   Respiratory: Negative for cough, chest tightness and shortness of breath.   Cardiovascular: Negative for chest pain, palpitations and leg swelling.  Gastrointestinal: Negative for nausea, vomiting, abdominal pain and diarrhea.  Musculoskeletal: Negative for back pain and joint swelling.  Skin: Negative for color change and rash.  Neurological: Positive for headaches. Negative for dizziness and light-headedness.  Psychiatric/Behavioral: Negative for agitation.       Increased stress as outlined.         Objective:    Physical Exam  Constitutional: She appears well-developed and well-nourished. No distress.  HENT:  Nose: Nose normal.  Mouth/Throat:  Oropharynx is clear and moist.  Neck: Neck supple. No thyromegaly present.  Cardiovascular: Normal rate and regular rhythm.   Pulmonary/Chest: Breath sounds normal. No respiratory distress. She has no wheezes.  Abdominal: Soft. Bowel sounds are normal. There is no tenderness.  Musculoskeletal: She exhibits no edema or tenderness.  Lymphadenopathy:    She has no cervical adenopathy.  Skin: No rash noted. No erythema.  Psychiatric: She has a normal mood and affect. Her behavior is normal.    BP 110/70 mmHg  Pulse 80  Temp(Src) 98.4 F (36.9 C) (Oral)  Resp 18  Ht 5' 3.75" (1.619 m)  Wt 115 lb 8 oz (52.39 kg)  BMI 19.99 kg/m2  SpO2 98% Wt Readings from Last 3 Encounters:  03/20/16 115 lb 8 oz (52.39 kg)  01/05/16 116 lb 6 oz (52.787 kg)  10/31/15 119 lb (53.978 kg)     Lab Results  Component Value Date   WBC 3.9* 10/14/2015   HGB 12.7 10/14/2015   HCT 37.6 10/14/2015   PLT 163.0 10/14/2015   GLUCOSE 100* 10/14/2015   CHOL 187 10/14/2015   TRIG 78.0 10/14/2015   HDL 61.70 10/14/2015   LDLCALC 110* 10/14/2015   ALT 11 10/14/2015   AST 22 10/14/2015   NA 142 10/14/2015   K 3.6 10/14/2015   CL 107 10/14/2015   CREATININE 0.83 10/14/2015   BUN 18 10/14/2015   CO2 29 10/14/2015   TSH 2.22 10/14/2015    Mm Screening Breast Tomo Bilateral  03/02/2016  CLINICAL DATA:  Screening. EXAM: 2D DIGITAL SCREENING BILATERAL MAMMOGRAM WITH CAD AND ADJUNCT TOMO COMPARISON:  Previous exam(s). ACR Breast Density Category d: The breast tissue is extremely dense, which lowers the sensitivity of mammography. FINDINGS: There are no findings suspicious for malignancy. Images were processed with CAD. IMPRESSION: No mammographic evidence of malignancy. A result letter of this screening mammogram will be mailed directly to the patient. RECOMMENDATION: Screening mammogram in one year. (Code:SM-B-01Y) BI-RADS CATEGORY  1: Negative. Electronically Signed   By: Ammie Ferrier M.D.   On: 03/02/2016  11:52       Assessment & Plan:   Problem List Items Addressed This Visit    Anxiety - Primary    Increased stress as outlined.  Discussed with her today.  On cymbalta.  Takes xanax prn.   Discussed psychiatry referral.  She will consider.        Leukopenia    White count decreased but stable.  Follow cbc.       Migraine headache    Followed by neurology.  Some increased headache recently.  No change in headache type of intensity.  Has f/u scheduled with neurology.  See if can get earlier appt.  Treat increased stress.  See if can decrease amount of maxalt.          I spent 25 minutes with the patient and more than 50% of the time was spent in consultation regarding the above.     Einar Pheasant, MD

## 2016-03-20 NOTE — Progress Notes (Signed)
Pre-visit discussion using our clinic review tool. No additional management support is needed unless otherwise documented below in the visit note.  

## 2016-03-20 NOTE — Telephone Encounter (Signed)
Patient stated that she was advise to return a call with Dr. Marella Bile number : [phone] She stated that she has an appt schedule for Apr 20, 2016

## 2016-03-21 ENCOUNTER — Encounter: Payer: Self-pay | Admitting: Internal Medicine

## 2016-03-21 NOTE — Assessment & Plan Note (Signed)
White count decreased but stable.  Follow cbc.

## 2016-03-21 NOTE — Assessment & Plan Note (Signed)
Followed by neurology.  Some increased headache recently.  No change in headache type of intensity.  Has f/u scheduled with neurology.  See if can get earlier appt.  Treat increased stress.  See if can decrease amount of maxalt.

## 2016-03-21 NOTE — Assessment & Plan Note (Signed)
Increased stress as outlined.  Discussed with her today.  On cymbalta.  Takes xanax prn.   Discussed psychiatry referral.  She will consider.

## 2016-04-04 ENCOUNTER — Other Ambulatory Visit: Payer: Self-pay | Admitting: Internal Medicine

## 2016-04-20 DIAGNOSIS — G4723 Circadian rhythm sleep disorder, irregular sleep wake type: Secondary | ICD-10-CM | POA: Diagnosis not present

## 2016-04-20 DIAGNOSIS — G43719 Chronic migraine without aura, intractable, without status migrainosus: Secondary | ICD-10-CM | POA: Diagnosis not present

## 2016-04-20 DIAGNOSIS — G4701 Insomnia due to medical condition: Secondary | ICD-10-CM | POA: Diagnosis not present

## 2016-05-23 DIAGNOSIS — L281 Prurigo nodularis: Secondary | ICD-10-CM | POA: Diagnosis not present

## 2016-05-23 DIAGNOSIS — L728 Other follicular cysts of the skin and subcutaneous tissue: Secondary | ICD-10-CM | POA: Diagnosis not present

## 2016-05-29 ENCOUNTER — Other Ambulatory Visit: Payer: Self-pay | Admitting: *Deleted

## 2016-05-29 NOTE — Telephone Encounter (Signed)
Patient last seen 03/20/16.  Xanax Initially written in April.  No refills left.  Pended for your approval/denial.

## 2016-05-29 NOTE — Telephone Encounter (Signed)
Patient requeted a medication refill for alprazolam

## 2016-05-30 ENCOUNTER — Telehealth: Payer: Self-pay | Admitting: *Deleted

## 2016-05-30 MED ORDER — ALPRAZOLAM 0.25 MG PO TABS: 0.2500 mg | ORAL_TABLET | Freq: Every day | ORAL | 0 refills | Status: DC | PRN

## 2016-05-30 NOTE — Telephone Encounter (Signed)
rx ok'd for xanax #30 with no refills.   

## 2016-05-30 NOTE — Telephone Encounter (Signed)
faxed

## 2016-05-30 NOTE — Telephone Encounter (Signed)
Pt called to check on refill status

## 2016-06-06 ENCOUNTER — Other Ambulatory Visit: Payer: Self-pay | Admitting: Internal Medicine

## 2016-06-12 ENCOUNTER — Ambulatory Visit: Payer: Medicare Other | Admitting: Internal Medicine

## 2016-06-14 ENCOUNTER — Encounter: Payer: Self-pay | Admitting: Internal Medicine

## 2016-06-14 ENCOUNTER — Ambulatory Visit (INDEPENDENT_AMBULATORY_CARE_PROVIDER_SITE_OTHER): Payer: Medicare Other | Admitting: Internal Medicine

## 2016-06-14 VITALS — BP 140/72 | HR 74 | Temp 98.6°F | Ht 64.0 in | Wt 117.4 lb

## 2016-06-14 DIAGNOSIS — F419 Anxiety disorder, unspecified: Secondary | ICD-10-CM

## 2016-06-14 DIAGNOSIS — G43709 Chronic migraine without aura, not intractable, without status migrainosus: Secondary | ICD-10-CM

## 2016-06-14 DIAGNOSIS — Z9109 Other allergy status, other than to drugs and biological substances: Secondary | ICD-10-CM

## 2016-06-14 DIAGNOSIS — D72819 Decreased white blood cell count, unspecified: Secondary | ICD-10-CM | POA: Diagnosis not present

## 2016-06-14 DIAGNOSIS — Z79899 Other long term (current) drug therapy: Secondary | ICD-10-CM | POA: Diagnosis not present

## 2016-06-14 LAB — CBC WITH DIFFERENTIAL/PLATELET
BASOS PCT: 0.2 % (ref 0.0–3.0)
Basophils Absolute: 0 10*3/uL (ref 0.0–0.1)
EOS PCT: 3 % (ref 0.0–5.0)
Eosinophils Absolute: 0.2 10*3/uL (ref 0.0–0.7)
HEMATOCRIT: 35 % — AB (ref 36.0–46.0)
HEMOGLOBIN: 11.8 g/dL — AB (ref 12.0–15.0)
LYMPHS PCT: 26.8 % (ref 12.0–46.0)
Lymphs Abs: 1.9 10*3/uL (ref 0.7–4.0)
MCHC: 33.7 g/dL (ref 30.0–36.0)
MCV: 85.8 fl (ref 78.0–100.0)
MONO ABS: 0.4 10*3/uL (ref 0.1–1.0)
MONOS PCT: 5.5 % (ref 3.0–12.0)
Neutro Abs: 4.6 10*3/uL (ref 1.4–7.7)
Neutrophils Relative %: 64.5 % (ref 43.0–77.0)
Platelets: 153 10*3/uL (ref 150.0–400.0)
RBC: 4.08 Mil/uL (ref 3.87–5.11)
RDW: 14.4 % (ref 11.5–15.5)
WBC: 7.1 10*3/uL (ref 4.0–10.5)

## 2016-06-14 MED ORDER — ALPRAZOLAM 0.25 MG PO TABS: 0.2500 mg | ORAL_TABLET | Freq: Every day | ORAL | 0 refills | Status: DC | PRN

## 2016-06-14 NOTE — Progress Notes (Signed)
Pre visit review using our clinic review tool, if applicable. No additional management support is needed unless otherwise documented below in the visit note. 

## 2016-06-14 NOTE — Progress Notes (Signed)
Patient ID: Brandi Ramos, female   DOB: 02-19-1946, 70 y.o.   MRN: [id number]   Subjective:    Patient ID: Brandi Ramos, female    DOB: 1946-02-19, 70 y.o.   MRN: [id number]  HPI  Patient here for a scheduled follow up.  She is doing relatively well.  Seeing her neurologist for her headaches.  He is adjusting her medications.  Collie Siad f/u soon.  Still with increased stress.  Have discussed f/u with psychiatry.  She has wanted to hold on referral.  Some better.  Stays active.  No chest pain.  No sob.  No acid reflux.  No abdominal pain or cramping.  Bowels stable.     Past Medical History:  Diagnosis Date  . Anxiety   . Arthritis   . Cancer ([place])    skin  . Chicken pox   . Diverticulosis   . Fibrocystic breast disease   . GERD (gastroesophageal reflux disease)   . Migraine headache   . Neutropenia ([place])    worked up - Dr Oliva Bustard, slightly elevated ANA  . PUD (peptic ulcer disease)    Past Surgical History:  Procedure Laterality Date  . ABDOMINAL HYSTERECTOMY  1984  . ESOPHAGOGASTRODUODENOSCOPY (EGD) WITH PROPOFOL N/A 10/31/2015   Procedure: ESOPHAGOGASTRODUODENOSCOPY (EGD) WITH PROPOFOL;  Surgeon: Lollie Sails, MD;  Location: [facility] ENDOSCOPY;  Service: Endoscopy;  Laterality: N/A;   Family History  Problem Relation Age of Onset  . CAD Mother     s/p CABG  . Hypercholesterolemia Mother   . Breast cancer Mother 18  . Hypertension Father   . Migraines Father   . Hypercholesterolemia Father   . Migraines Sister   . Lymphoma      grandmother  . Colon cancer Neg Hx    Social History   Social History  . Marital status: Widowed    Spouse name: N/A  . Number of children: 2  . Years of education: N/A   Social History Main Topics  . Smoking status: Never Smoker  . Smokeless tobacco: Never Used  . Alcohol use No     Comment: wine occassionally  . Drug use: No  . Sexual activity: Not Asked   Other Topics Concern  . None   Social History Narrative  . None     Outpatient Encounter Prescriptions as of 06/14/2016  Medication Sig  . ALPRAZolam (XANAX) 0.25 MG tablet Take 1 tablet (0.25 mg total) by mouth daily as needed for anxiety.  . DULoxetine (CYMBALTA) 60 MG capsule Take 1 capsule (60 mg total) by mouth daily.  . fluticasone (FLONASE) 50 MCG/ACT nasal spray SHAKE LIQUID AND USE 2 SPRAYS IN EACH NOSTRIL DAILY  . LINZESS 145 MCG CAPS capsule Take 145 mcg by mouth daily.  Marland Kitchen loratadine (CLARITIN) 10 MG tablet Take 10 mg by mouth daily.  . propranolol (INDERAL) 40 MG tablet TAKE 1 TABLET DAILY  . RABEprazole (ACIPHEX) 20 MG tablet Take 1 tablet (20 mg total) by mouth daily.  . rizatriptan (MAXALT) 10 MG tablet Take 10 mg by mouth as needed. May repeat in 2 hours if needed  . sucralfate (CARAFATE) 1 G tablet Take 1 tablet by mouth 2 (two) times daily as needed.  . topiramate (TOPAMAX) 50 MG tablet Take 150 mg by mouth at bedtime.  . [DISCONTINUED] ALPRAZolam (XANAX) 0.25 MG tablet Take 1 tablet (0.25 mg total) by mouth daily as needed.  . [DISCONTINUED] ALPRAZolam (XANAX) 0.25 MG tablet Take 1 tablet (0.25 mg total) by  mouth daily as needed for anxiety.   No facility-administered encounter medications on file as of 06/14/2016.     Review of Systems  Constitutional: Negative for appetite change and unexpected weight change.  HENT: Negative for congestion and sinus pressure.   Respiratory: Negative for cough, chest tightness and shortness of breath.   Cardiovascular: Negative for chest pain, palpitations and leg swelling.  Gastrointestinal: Negative for abdominal pain, diarrhea, nausea and vomiting.  Genitourinary: Negative for difficulty urinating and dysuria.  Musculoskeletal: Negative for back pain and joint swelling.  Skin: Negative for color change and rash.  Neurological: Negative for dizziness, light-headedness and headaches.  Psychiatric/Behavioral: Negative for agitation and dysphoric mood.       Objective:    Physical Exam   Constitutional: She appears well-developed and well-nourished. No distress.  HENT:  Nose: Nose normal.  Mouth/Throat: Oropharynx is clear and moist.  Neck: Neck supple. No thyromegaly present.  Cardiovascular: Normal rate and regular rhythm.   Pulmonary/Chest: Breath sounds normal. No respiratory distress. She has no wheezes.  Abdominal: Soft. Bowel sounds are normal. There is no tenderness.  Musculoskeletal: She exhibits no edema or tenderness.  Lymphadenopathy:    She has no cervical adenopathy.  Skin: No rash noted. No erythema.  Psychiatric: She has a normal mood and affect. Her behavior is normal.    BP 140/72   Pulse 74   Temp 98.6 F (37 C) (Oral)   Ht 5\' 4"  (1.626 m)   Wt 117 lb 6.4 oz (53.3 kg)   SpO2 98%   BMI 20.15 kg/m  Wt Readings from Last 3 Encounters:  06/14/16 117 lb 6.4 oz (53.3 kg)  03/20/16 115 lb 8 oz (52.4 kg)  01/05/16 116 lb 6 oz (52.8 kg)     Lab Results  Component Value Date   WBC 7.1 06/14/2016   HGB 11.8 (L) 06/14/2016   HCT 35.0 (L) 06/14/2016   PLT 153.0 06/14/2016   GLUCOSE 100 (H) 10/14/2015   CHOL 187 10/14/2015   TRIG 78.0 10/14/2015   HDL 61.70 10/14/2015   LDLCALC 110 (H) 10/14/2015   ALT 11 10/14/2015   AST 22 10/14/2015   NA 142 10/14/2015   K 3.6 10/14/2015   CL 107 10/14/2015   CREATININE 0.83 10/14/2015   BUN 18 10/14/2015   CO2 29 10/14/2015   TSH 2.22 10/14/2015    Mm Screening Breast Tomo Bilateral  Result Date: 03/02/2016 CLINICAL DATA:  Screening. EXAM: 2D DIGITAL SCREENING BILATERAL MAMMOGRAM WITH CAD AND ADJUNCT TOMO COMPARISON:  Previous exam(s). ACR Breast Density Category d: The breast tissue is extremely dense, which lowers the sensitivity of mammography. FINDINGS: There are no findings suspicious for malignancy. Images were processed with CAD. IMPRESSION: No mammographic evidence of malignancy. A result letter of this screening mammogram will be mailed directly to the patient. RECOMMENDATION: Screening  mammogram in one year. (Code:SM-B-01Y) BI-RADS CATEGORY  1: Negative. Electronically Signed   By: Ammie Ferrier M.D.   On: 03/02/2016 11:52       Assessment & Plan:   Problem List Items Addressed This Visit    Anxiety    Increased stress.  Overall feels she is handling things relatively well.  Again discussed f/u with psychiatry.  She desires not to pursue at this time.  Follow.       Relevant Medications   ALPRAZolam (XANAX) 0.25 MG tablet   Environmental allergies    Some minimal congestion.  Saline nasal spray and steroid nasal spray as outlined.  Follow.        Leukopenia - Primary    White count has been stable.  Follow cbc.       Relevant Orders   CBC with Differential/Platelet (Completed)   Migraine headache    Followed by neurology.  Just evaluated.  Medications being adjusted.  Has a f/u appt scheduled soon.         Other Visit Diagnoses   None.      Einar Pheasant, MD

## 2016-06-14 NOTE — Patient Instructions (Signed)
Saline nasal spray - flush nose at lease 2-3x/day  nasacort nasal spray - 2 sprays each nostril one time per day.  Do this in the evening.    mucinex in the am.  If needed, robitussin in the evening.

## 2016-06-15 ENCOUNTER — Other Ambulatory Visit: Payer: Self-pay | Admitting: Internal Medicine

## 2016-06-15 DIAGNOSIS — E538 Deficiency of other specified B group vitamins: Secondary | ICD-10-CM

## 2016-06-15 DIAGNOSIS — D649 Anemia, unspecified: Secondary | ICD-10-CM

## 2016-06-15 NOTE — Progress Notes (Signed)
Orders placed for f/u labs.  

## 2016-06-19 ENCOUNTER — Encounter: Payer: Self-pay | Admitting: Internal Medicine

## 2016-06-19 NOTE — Assessment & Plan Note (Signed)
White count has been stable.  Follow cbc.  

## 2016-06-19 NOTE — Assessment & Plan Note (Signed)
Increased stress.  Overall feels she is handling things relatively well.  Again discussed f/u with psychiatry.  She desires not to pursue at this time.  Follow.

## 2016-06-19 NOTE — Assessment & Plan Note (Signed)
Followed by neurology.  Just evaluated.  Medications being adjusted.  Has a f/u appt scheduled soon.

## 2016-06-19 NOTE — Assessment & Plan Note (Signed)
Some minimal congestion.  Saline nasal spray and steroid nasal spray as outlined.  Follow.

## 2016-06-26 IMAGING — CT CT ABDOMEN W/ CM
1 of 3 series · 14 of 32 positions shown, 19 images · IV contrast (omnipaque)
Comparison: 10/29/2009

CLINICAL DATA: Epigastric pain.  Nausea for 2 months

EXAM:
CT ABDOMEN WITH CONTRAST
TECHNIQUE: Multidetector CT imaging of the abdomen was performed using the
standard protocol following bolus administration of intravenous
contrast.
CONTRAST:  75mL OMNIPAQUE IOHEXOL 300 MG/ML  SOLN

[Series 2: routine abd pel with · axial · 0.55mm/px · z∈[-976,-756]mm · 14 of 50 slices shown, 19 images]
[im 3/50  soft-tissue]
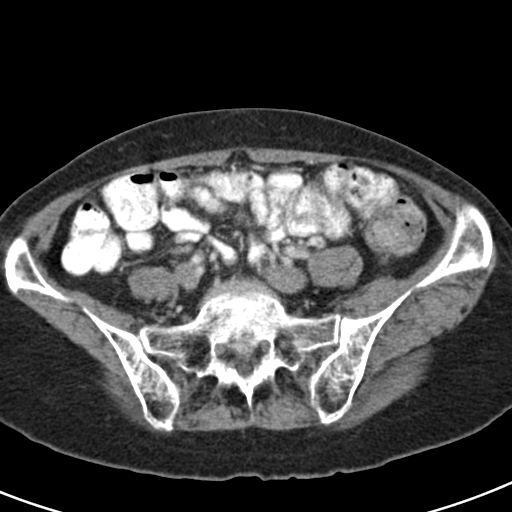
[im 3/50  bone]
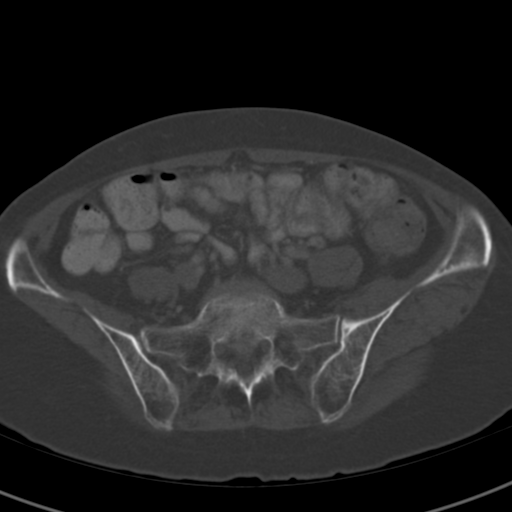
[im 6/50  soft-tissue]
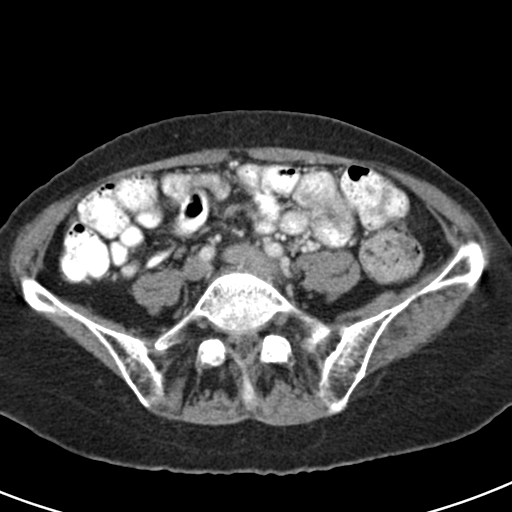
[im 12/50  soft-tissue]
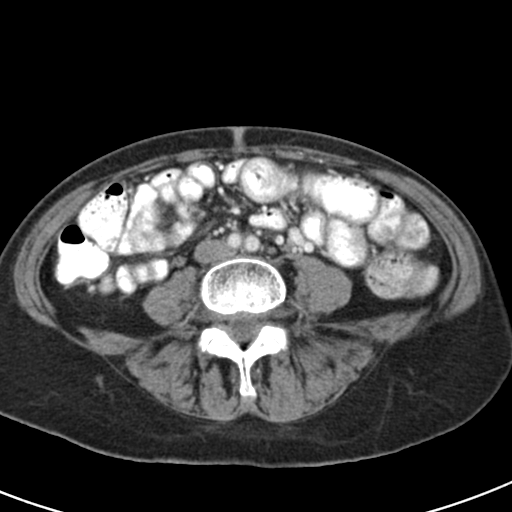
[im 15/50  soft-tissue]
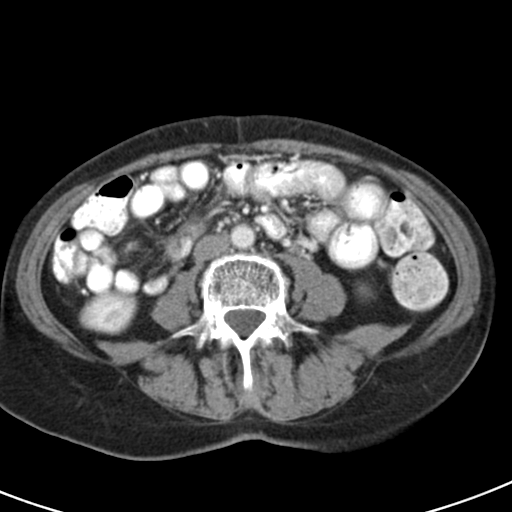
[im 18/50  soft-tissue]
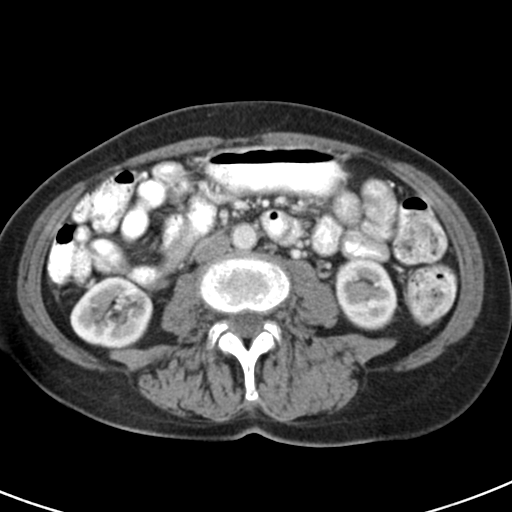
[im 21/50  soft-tissue]
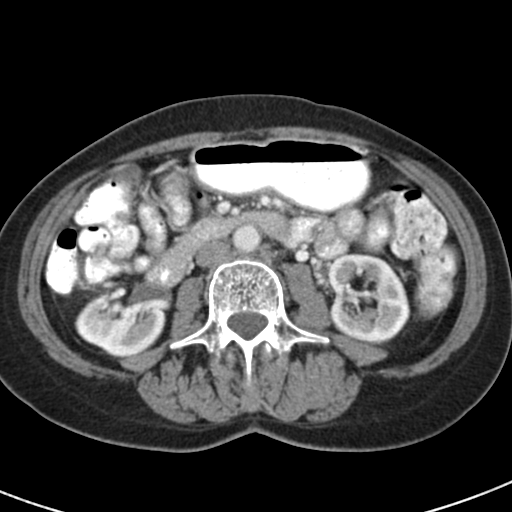
[im 26/50  soft-tissue]
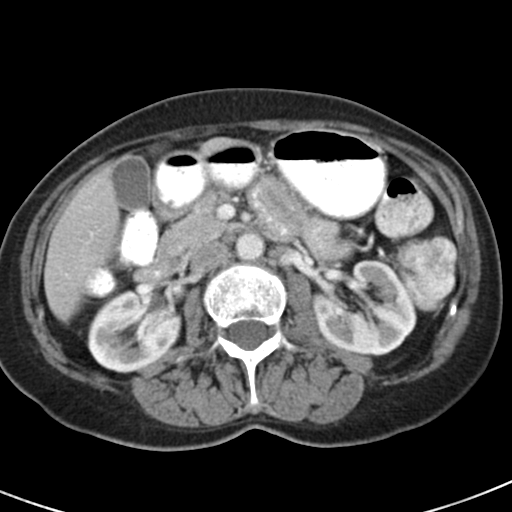
[im 29/50  soft-tissue]
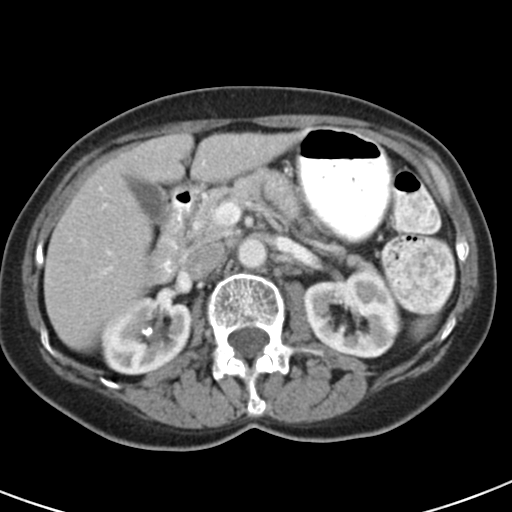
[im 32/50  soft-tissue]
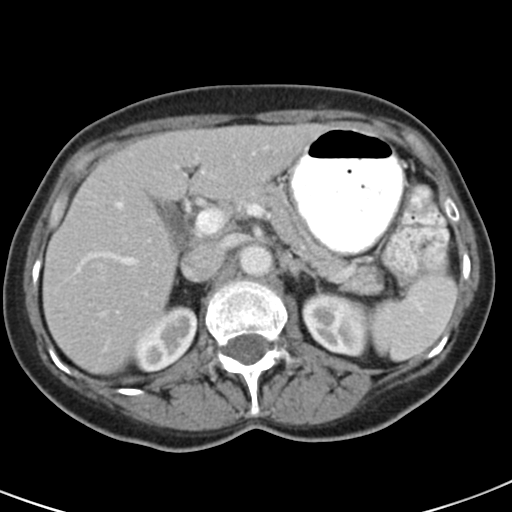
[im 32/50  bone]
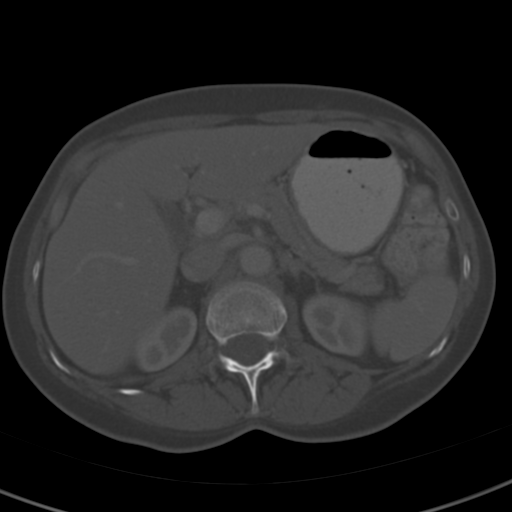
[im 35/50  soft-tissue]
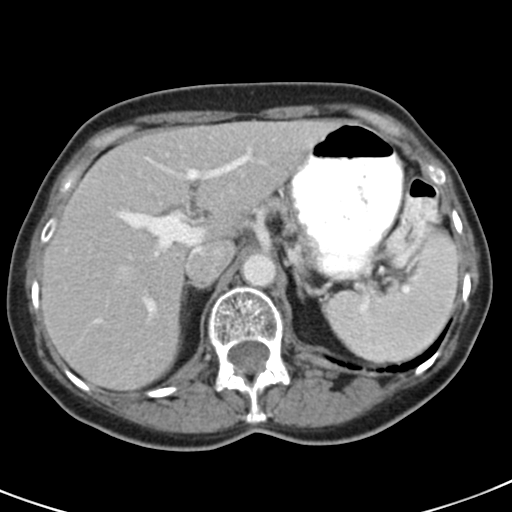
[im 38/50  soft-tissue]
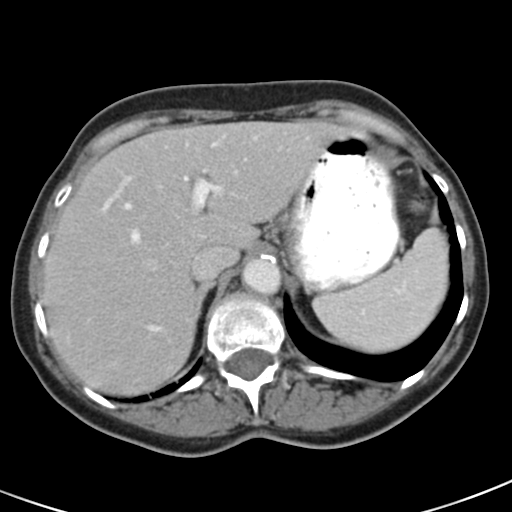
[im 38/50  lung]
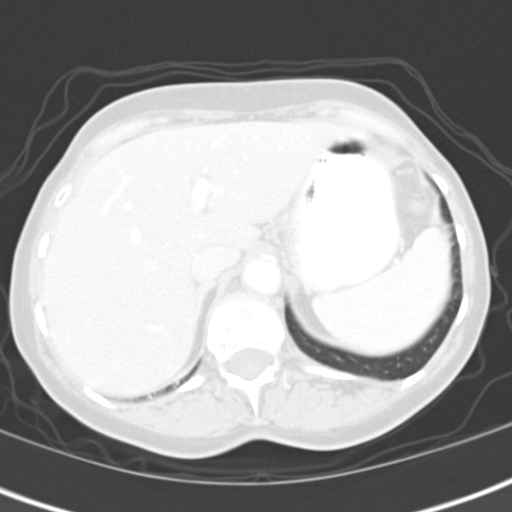
[im 41/50  lung]
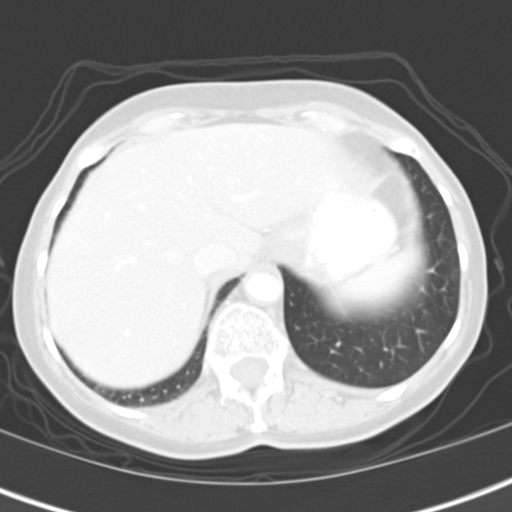
[im 44/50  soft-tissue]
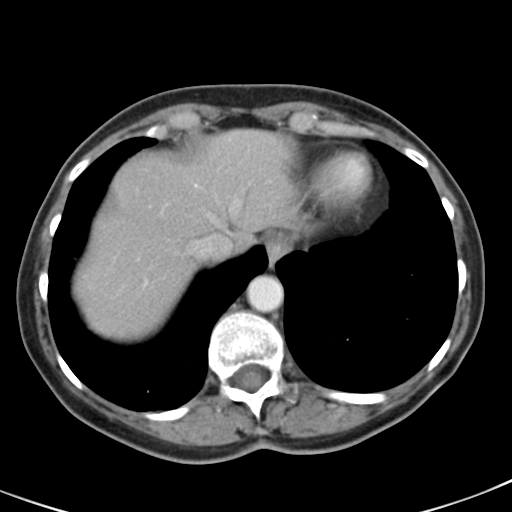
[im 44/50  lung]
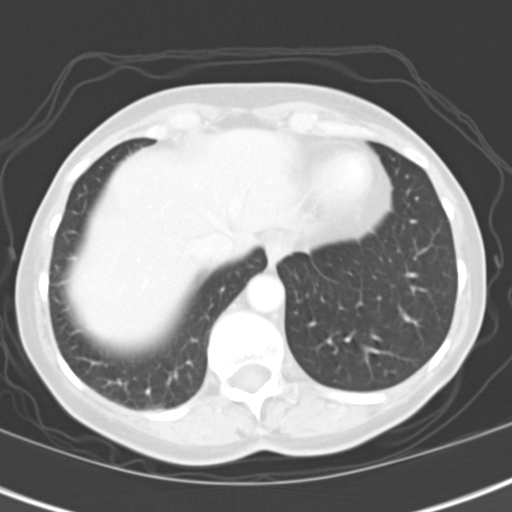
[im 47/50  soft-tissue]
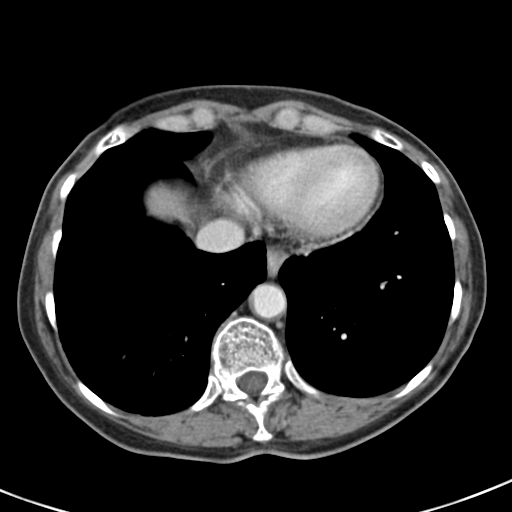
[im 47/50  lung]
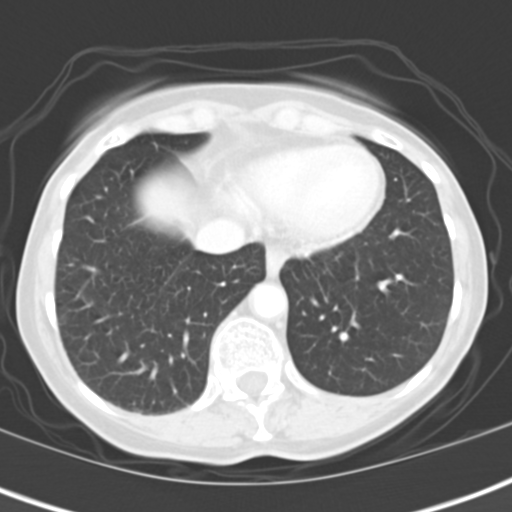

[14 of 32 positions shown; findings below may reference images not displayed]

FINDINGS: Lower chest: No pleural or pericardial effusion identified. The lung
bases are clear.

Hepatobiliary: No suspicious liver abnormalities identified. The
gallbladder appears normal. There is no biliary dilatation.

Pancreas: No mass, inflammatory changes, or other significant
abnormality.

Spleen: Within normal limits in size and appearance.

Adrenals/Urinary Tract: Normal adrenal glands. Normal appearance of
the kidneys.

Stomach/Bowel: No evidence of obstruction, inflammatory process, or
abnormal fluid collections.

Vascular/Lymphatic: No pathologically enlarged lymph nodes. No
evidence of abdominal aortic aneurysm.

Other: None.

Musculoskeletal: Chronic appearing superior endplate deformity
involves the L5 vertebra.
IMPRESSION: 1. No acute findings identified within the abdomen or pelvis.
2. Chronic appearing superior endplate deformity at the L5 level.

## 2016-07-09 ENCOUNTER — Encounter: Payer: Self-pay | Admitting: Internal Medicine

## 2016-07-09 ENCOUNTER — Other Ambulatory Visit (INDEPENDENT_AMBULATORY_CARE_PROVIDER_SITE_OTHER): Payer: Medicare Other

## 2016-07-09 DIAGNOSIS — E538 Deficiency of other specified B group vitamins: Secondary | ICD-10-CM

## 2016-07-09 DIAGNOSIS — D649 Anemia, unspecified: Secondary | ICD-10-CM | POA: Diagnosis not present

## 2016-07-09 LAB — VITAMIN B12: Vitamin B-12: 272 pg/mL (ref 211–911)

## 2016-07-09 LAB — IBC PANEL
Iron: 80 ug/dL (ref 42–145)
Saturation Ratios: 24.7 % (ref 20.0–50.0)
Transferrin: 231 mg/dL (ref 212.0–360.0)

## 2016-07-09 LAB — HEMOGLOBIN: Hemoglobin: 12.2 g/dL (ref 12.0–15.0)

## 2016-07-09 LAB — FERRITIN: FERRITIN: 27.2 ng/mL (ref 10.0–291.0)

## 2016-07-12 ENCOUNTER — Other Ambulatory Visit: Payer: Self-pay | Admitting: Internal Medicine

## 2016-07-23 DIAGNOSIS — G4723 Circadian rhythm sleep disorder, irregular sleep wake type: Secondary | ICD-10-CM | POA: Diagnosis not present

## 2016-07-23 DIAGNOSIS — G43719 Chronic migraine without aura, intractable, without status migrainosus: Secondary | ICD-10-CM | POA: Diagnosis not present

## 2016-07-23 DIAGNOSIS — G43019 Migraine without aura, intractable, without status migrainosus: Secondary | ICD-10-CM | POA: Diagnosis not present

## 2016-07-23 DIAGNOSIS — G4701 Insomnia due to medical condition: Secondary | ICD-10-CM | POA: Diagnosis not present

## 2016-07-30 ENCOUNTER — Telehealth: Payer: Self-pay | Admitting: Internal Medicine

## 2016-07-30 NOTE — Telephone Encounter (Signed)
Last filled 06/14/16 30 0rf

## 2016-07-31 NOTE — Telephone Encounter (Signed)
Pt requested a follow up on this medication Pt contact [phone]

## 2016-07-31 NOTE — Telephone Encounter (Signed)
faxed

## 2016-08-06 ENCOUNTER — Ambulatory Visit (INDEPENDENT_AMBULATORY_CARE_PROVIDER_SITE_OTHER): Payer: Medicare Other | Admitting: Family

## 2016-08-06 ENCOUNTER — Encounter: Payer: Self-pay | Admitting: Family

## 2016-08-06 VITALS — BP 134/64 | HR 67 | Temp 98.2°F | Ht 64.0 in | Wt 119.2 lb

## 2016-08-06 DIAGNOSIS — J209 Acute bronchitis, unspecified: Secondary | ICD-10-CM

## 2016-08-06 DIAGNOSIS — R0981 Nasal congestion: Secondary | ICD-10-CM | POA: Diagnosis not present

## 2016-08-06 MED ORDER — BENZONATATE 100 MG PO CAPS: 100.0000 mg | ORAL_CAPSULE | Freq: Two times a day (BID) | ORAL | 0 refills | Status: DC | PRN

## 2016-08-06 MED ORDER — AMOXICILLIN 500 MG PO CAPS: 500.0000 mg | ORAL_CAPSULE | Freq: Two times a day (BID) | ORAL | 0 refills | Status: DC

## 2016-08-06 NOTE — Progress Notes (Signed)
Subjective:    Patient ID: Brandi Ramos, female    DOB: 03-09-1946, 70 y.o.   MRN: [id number]  CC: Brandi Ramos is a 70 y.o. female who presents today for an acute visit.    HPI: Cc: cough, congestion for one month, waxed and waned. Endorses fatigue, hoarseness, chills. Tried mucinex, sudafed with some relief. No fever, SOB, ear pain, severe HA, vision changes.       HISTORY:  Past Medical History:  Diagnosis Date  . Anxiety   . Arthritis   . Cancer ([place])    skin  . Chicken pox   . Diverticulosis   . Fibrocystic breast disease   . GERD (gastroesophageal reflux disease)   . Migraine headache   . Neutropenia ([place])    worked up - Dr Oliva Bustard, slightly elevated ANA  . PUD (peptic ulcer disease)    Past Surgical History:  Procedure Laterality Date  . ABDOMINAL HYSTERECTOMY  1984  . ESOPHAGOGASTRODUODENOSCOPY (EGD) WITH PROPOFOL N/A 10/31/2015   Procedure: ESOPHAGOGASTRODUODENOSCOPY (EGD) WITH PROPOFOL;  Surgeon: Lollie Sails, MD;  Location: [facility] ENDOSCOPY;  Service: Endoscopy;  Laterality: N/A;   Family History  Problem Relation Age of Onset  . CAD Mother     s/p CABG  . Hypercholesterolemia Mother   . Breast cancer Mother 89  . Hypertension Father   . Migraines Father   . Hypercholesterolemia Father   . Migraines Sister   . Lymphoma      grandmother  . Colon cancer Neg Hx     Allergies: Amitiza [lubiprostone] and Sulfa antibiotics Current Outpatient Prescriptions on File Prior to Visit  Medication Sig Dispense Refill  . ALPRAZolam (XANAX) 0.25 MG tablet TAKE 1 TABLET BY MOUTH DAILY AS NEEDED FOR ANXIETY 30 tablet 0  . DULoxetine (CYMBALTA) 60 MG capsule TAKE 1 CAPSULE DAILY 90 capsule 3  . fluticasone (FLONASE) 50 MCG/ACT nasal spray SHAKE LIQUID AND USE 2 SPRAYS IN EACH NOSTRIL DAILY 16 g 11  . LINZESS 145 MCG CAPS capsule Take 145 mcg by mouth daily.  3  . loratadine (CLARITIN) 10 MG tablet Take 10 mg by mouth daily.    . propranolol (INDERAL) 40 MG  tablet TAKE 1 TABLET DAILY 90 tablet 4  . RABEprazole (ACIPHEX) 20 MG tablet TAKE 1 TABLET DAILY 90 tablet 1  . rizatriptan (MAXALT) 10 MG tablet Take 10 mg by mouth as needed. May repeat in 2 hours if needed    . sucralfate (CARAFATE) 1 G tablet Take 1 tablet by mouth 2 (two) times daily as needed.  3  . topiramate (TOPAMAX) 50 MG tablet Take 150 mg by mouth at bedtime.     No current facility-administered medications on file prior to visit.     Social History  Substance Use Topics  . Smoking status: Never Smoker  . Smokeless tobacco: Never Used  . Alcohol use No     Comment: wine occassionally    Review of Systems  Constitutional: Negative for chills and fever.  HENT: Positive for congestion, sinus pressure and sore throat.   Eyes: Negative for visual disturbance.  Respiratory: Positive for cough. Negative for shortness of breath and wheezing.   Cardiovascular: Negative for chest pain and palpitations.  Gastrointestinal: Negative for nausea and vomiting.  Neurological: Positive for headaches (chronic migraine).      Objective:    BP 134/64   Pulse 67   Temp 98.2 F (36.8 C) (Oral)   Ht 5\' 4"  (1.626 m)  Wt 119 lb 3.2 oz (54.1 kg)   SpO2 99%   BMI 20.46 kg/m    Physical Exam  Constitutional: She appears well-developed and well-nourished.  HENT:  Head: Normocephalic and atraumatic.  Right Ear: Hearing, tympanic membrane, external ear and ear canal normal. No drainage, swelling or tenderness. No foreign bodies. Tympanic membrane is not erythematous and not bulging. No middle ear effusion. No decreased hearing is noted.  Left Ear: Hearing, tympanic membrane, external ear and ear canal normal. No drainage, swelling or tenderness. No foreign bodies. Tympanic membrane is not erythematous and not bulging.  No middle ear effusion. No decreased hearing is noted.  Nose: Rhinorrhea present. Right sinus exhibits no maxillary sinus tenderness and no frontal sinus tenderness. Left  sinus exhibits no maxillary sinus tenderness and no frontal sinus tenderness.  Mouth/Throat: Uvula is midline, oropharynx is clear and moist and mucous membranes are normal. No oropharyngeal exudate, posterior oropharyngeal edema, posterior oropharyngeal erythema or tonsillar abscesses.  Eyes: Conjunctivae are normal.  Cardiovascular: Regular rhythm, normal heart sounds and normal pulses.   Pulmonary/Chest: Effort normal and breath sounds normal. She has no wheezes. She has no rhonchi. She has no rales.  Lymphadenopathy:       Head (right side): No submental, no submandibular, no tonsillar, no preauricular, no posterior auricular and no occipital adenopathy present.       Head (left side): No submental, no submandibular, no tonsillar, no preauricular, no posterior auricular and no occipital adenopathy present.    She has no cervical adenopathy.  Neurological: She is alert.  Skin: Skin is warm and dry.  Psychiatric: She has a normal mood and affect. Her speech is normal and behavior is normal. Thought content normal.  Vitals reviewed.      Assessment & Plan:  1. Acute bronchitis, unspecified organism Suspect viral in nature. Afebrile. SaO2 within normal limits. - benzonatate (TESSALON) 100 MG capsule; Take 1 capsule (100 mg total) by mouth 2 (two) times daily as needed for cough.  Dispense: 20 capsule; Refill: 0  2. Sinus congestion Suspect viral versus allergy. However based on duration of symptoms, patient and I agreed conservative management over the next 1-2 days, if no not better, patient understands to fill amoxicillin.  - amoxicillin (AMOXIL) 500 MG capsule; Take 1 capsule (500 mg total) by mouth 2 (two) times daily.  Dispense: 14 capsule; Refill: 0     I am having Brandi Ramos start on amoxicillin and benzonatate. I am also having her maintain her rizatriptan, loratadine, LINZESS, sucralfate, topiramate, fluticasone, propranolol, DULoxetine, RABEprazole, and ALPRAZolam.   Meds  ordered this encounter  Medications  . amoxicillin (AMOXIL) 500 MG capsule    Sig: Take 1 capsule (500 mg total) by mouth 2 (two) times daily.    Dispense:  14 capsule    Refill:  0    Order Specific Question:   Supervising Provider    Answer:   Deborra Medina L [[id number]]  . benzonatate (TESSALON) 100 MG capsule    Sig: Take 1 capsule (100 mg total) by mouth 2 (two) times daily as needed for cough.    Dispense:  20 capsule    Refill:  0    Order Specific Question:   Supervising Provider    Answer:   Crecencio Mc [[id number]]    Return precautions given.   Risks, benefits, and alternatives of the medications and treatment plan prescribed today were discussed, and patient expressed understanding.   Education regarding symptom management and diagnosis given to  patient on AVS.  Continue to follow with Einar Pheasant, MD for routine health maintenance.   Brandi Ramos and I agreed with plan.   Mable Paris, FNP

## 2016-08-06 NOTE — Patient Instructions (Signed)
I suspect that your infection is viral in nature.  As discussed, I advise that you wait to fill the antibiotic after 1-2 days of symptom management to see if your symptoms improve. If you do not show improvement, you may take the antibiotic as prescribed.  Increase intake of clear fluids. Congestion is best treated by hydration, when mucus is wetter, it is thinner, less sticky, and easier to expel from the body, either through coughing up drainage, or by blowing your nose.   Get plenty of rest.   Use saline nasal drops and blow your nose frequently. Run a humidifier at night and elevate the head of the bed. Vicks Vapor rub will help with congestion and cough. Steam showers and sinus massage for congestion.   Use Acetaminophen or Ibuprofen as needed for fever or pain. Avoid second hand smoke. Even the smallest exposure will worsen symptoms.   Over the counter medications you can try include Delsym for cough, a decongestant for congestion, and Mucinex or Robitussin as an expectorant. Be sure to just get the plain Mucinex or Robitussin that just has one medication (Guaifenesen). We don't recommend the combination products. Note, be sure to drink two glasses of water with each dose of Mucinex as the medication will not work well without adequate hydration.   You can also try a teaspoon of honey to see if this will help reduce cough. Throat lozenges can sometimes be beneficial as well.    This illness will typically last 7 - 10 days.   Please follow up with our clinic if you develop a fever greater than 101 F, symptoms worsen, or do not resolve in the next week.     

## 2016-08-20 ENCOUNTER — Ambulatory Visit: Payer: Medicare Other

## 2016-08-28 ENCOUNTER — Telehealth: Payer: Self-pay | Admitting: Internal Medicine

## 2016-08-28 MED ORDER — ALPRAZOLAM 0.25 MG PO TABS: 0.2500 mg | ORAL_TABLET | Freq: Every day | ORAL | 0 refills | Status: DC | PRN

## 2016-08-28 NOTE — Telephone Encounter (Signed)
Please advise on refill.

## 2016-08-28 NOTE — Telephone Encounter (Signed)
RX faxed and patient notified. 

## 2016-08-28 NOTE — Telephone Encounter (Signed)
ok'd refill for xanax #30 with no refills.   

## 2016-08-28 NOTE — Telephone Encounter (Signed)
Pt called requesting a refill on ALPRAZolam (XANAX) 0.25 MG tablet. Please advise, thank you!  Pharmacy - [facility] [place], [place]  Call pt [phone]

## 2016-09-27 ENCOUNTER — Ambulatory Visit: Payer: Medicare Other | Admitting: Internal Medicine

## 2016-09-28 ENCOUNTER — Telehealth: Payer: Self-pay | Admitting: Internal Medicine

## 2016-09-28 ENCOUNTER — Other Ambulatory Visit: Payer: Self-pay | Admitting: Internal Medicine

## 2016-09-28 NOTE — Telephone Encounter (Signed)
Last OV 08/06/16 ok to fill?

## 2016-10-01 NOTE — Telephone Encounter (Signed)
Pt called to follow up on her medication refill. Please advise?  Call pt @ [phone]. Thank you!

## 2016-10-01 NOTE — Telephone Encounter (Signed)
Was refilled 09/30/16.  Question if has been faxed.

## 2016-10-01 NOTE — Telephone Encounter (Signed)
Script faxed.

## 2016-10-01 NOTE — Telephone Encounter (Signed)
Patient missed appointment due to weather ok to fill almost out.

## 2016-10-10 ENCOUNTER — Encounter: Payer: Self-pay | Admitting: Internal Medicine

## 2016-10-11 ENCOUNTER — Ambulatory Visit (INDEPENDENT_AMBULATORY_CARE_PROVIDER_SITE_OTHER): Payer: Medicare Other | Admitting: Internal Medicine

## 2016-10-11 ENCOUNTER — Encounter: Payer: Self-pay | Admitting: Internal Medicine

## 2016-10-11 ENCOUNTER — Telehealth: Payer: Self-pay | Admitting: *Deleted

## 2016-10-11 VITALS — BP 130/76 | HR 72 | Temp 98.6°F | Resp 16 | Wt 117.5 lb

## 2016-10-11 DIAGNOSIS — D72819 Decreased white blood cell count, unspecified: Secondary | ICD-10-CM

## 2016-10-11 DIAGNOSIS — Z23 Encounter for immunization: Secondary | ICD-10-CM

## 2016-10-11 DIAGNOSIS — G43709 Chronic migraine without aura, not intractable, without status migrainosus: Secondary | ICD-10-CM | POA: Diagnosis not present

## 2016-10-11 DIAGNOSIS — R519 Headache, unspecified: Secondary | ICD-10-CM

## 2016-10-11 DIAGNOSIS — F419 Anxiety disorder, unspecified: Secondary | ICD-10-CM

## 2016-10-11 DIAGNOSIS — R51 Headache: Secondary | ICD-10-CM | POA: Diagnosis not present

## 2016-10-11 MED ORDER — ALPRAZOLAM 0.25 MG PO TABS: 0.2500 mg | ORAL_TABLET | Freq: Every day | ORAL | 1 refills | Status: DC | PRN

## 2016-10-11 NOTE — Telephone Encounter (Signed)
Pt scheduled  

## 2016-10-11 NOTE — Progress Notes (Signed)
Pre visit review using our clinic review tool, if applicable. No additional management support is needed unless otherwise documented below in the visit note. 

## 2016-10-11 NOTE — Telephone Encounter (Signed)
Please schedule pt to see Dr. Nicki Reaper in seven weeks , advised by Dr. Nicki Reaper in check out notes -thanks

## 2016-10-11 NOTE — Progress Notes (Signed)
Patient ID: Brandi Ramos, female   DOB: May 02, 1946, 71 y.o.   MRN: [id number]   Subjective:    Patient ID: Brandi Ramos, female    DOB: 08-Nov-1945, 71 y.o.   MRN: [id number]  HPI  Patient here for a scheduled follow up.  She has been seeing Dr Chancy Milroy - headache clinic.  Having persistent headaches.  Was given a trial of prednisone when evaluated 08/2016.  States had no headache while on prednisone.  She is currently on topamax.  Taking propranolol.  Dose recently increased.  Describes the headache as all over headache.  Takes maxalt daily with two ibuprofen.  Headache is not present when she wakes up.  Occurs midday.  Loud noise aggravages.  Some light sensitivity.  She is due f/u with her neurologist 10/22/16.     Past Medical History:  Diagnosis Date  . Anxiety   . Arthritis   . Cancer ([place])    skin  . Chicken pox   . Diverticulosis   . Fibrocystic breast disease   . GERD (gastroesophageal reflux disease)   . Migraine headache   . Neutropenia ([place])    worked up - Dr Oliva Bustard, slightly elevated ANA  . PUD (peptic ulcer disease)    Past Surgical History:  Procedure Laterality Date  . ABDOMINAL HYSTERECTOMY  1984  . ESOPHAGOGASTRODUODENOSCOPY (EGD) WITH PROPOFOL N/A 10/31/2015   Procedure: ESOPHAGOGASTRODUODENOSCOPY (EGD) WITH PROPOFOL;  Surgeon: Lollie Sails, MD;  Location: [facility] ENDOSCOPY;  Service: Endoscopy;  Laterality: N/A;   Family History  Problem Relation Age of Onset  . CAD Mother     s/p CABG  . Hypercholesterolemia Mother   . Breast cancer Mother 23  . Hypertension Father   . Migraines Father   . Hypercholesterolemia Father   . Migraines Sister   . Lymphoma      grandmother  . Colon cancer Neg Hx    Social History   Social History  . Marital status: Widowed    Spouse name: N/A  . Number of children: 2  . Years of education: N/A   Social History Main Topics  . Smoking status: Never Smoker  . Smokeless tobacco: Never Used  . Alcohol use No   Comment: wine occassionally  . Drug use: No  . Sexual activity: Not Asked   Other Topics Concern  . None   Social History Narrative  . None    Outpatient Encounter Prescriptions as of 10/11/2016  Medication Sig  . ALPRAZolam (XANAX) 0.25 MG tablet Take 1 tablet (0.25 mg total) by mouth daily as needed. for anxiety  . amoxicillin (AMOXIL) 500 MG capsule Take 1 capsule (500 mg total) by mouth 2 (two) times daily.  . benzonatate (TESSALON) 100 MG capsule Take 1 capsule (100 mg total) by mouth 2 (two) times daily as needed for cough.  . DULoxetine (CYMBALTA) 60 MG capsule TAKE 1 CAPSULE DAILY  . fluticasone (FLONASE) 50 MCG/ACT nasal spray SHAKE LIQUID AND USE 2 SPRAYS IN EACH NOSTRIL DAILY  . LINZESS 145 MCG CAPS capsule Take 145 mcg by mouth daily.  Marland Kitchen loratadine (CLARITIN) 10 MG tablet Take 10 mg by mouth daily.  . propranolol (INDERAL) 40 MG tablet TAKE 1 TABLET DAILY  . RABEprazole (ACIPHEX) 20 MG tablet TAKE 1 TABLET DAILY  . rizatriptan (MAXALT) 10 MG tablet Take 10 mg by mouth as needed. May repeat in 2 hours if needed  . sucralfate (CARAFATE) 1 G tablet Take 1 tablet by mouth 2 (two) times daily  as needed.  . topiramate (TOPAMAX) 50 MG tablet Take 150 mg by mouth at bedtime.  . [DISCONTINUED] ALPRAZolam (XANAX) 0.25 MG tablet TAKE 1 TABLET BY MOUTH DAILY AS NEEDED FOR ANXIETY   No facility-administered encounter medications on file as of 10/11/2016.     Review of Systems  Constitutional: Negative for appetite change and unexpected weight change.  HENT: Negative for congestion and sinus pressure.   Respiratory: Negative for cough, chest tightness and shortness of breath.   Cardiovascular: Negative for chest pain, palpitations and leg swelling.  Gastrointestinal: Negative for abdominal pain, diarrhea, nausea and vomiting.  Genitourinary: Negative for difficulty urinating and dysuria.  Musculoskeletal: Negative for back pain and joint swelling.  Skin: Negative for color change and  rash.  Neurological: Positive for headaches. Negative for dizziness.  Psychiatric/Behavioral: Negative for agitation and dysphoric mood.       Increased stress related to headaches.         Objective:    Physical Exam  Constitutional: She appears well-developed and well-nourished. No distress.  HENT:  Nose: Nose normal.  Mouth/Throat: Oropharynx is clear and moist.  Neck: Neck supple. No thyromegaly present.  Cardiovascular: Normal rate and regular rhythm.   Pulmonary/Chest: Breath sounds normal. No respiratory distress. She has no wheezes.  Abdominal: Soft. Bowel sounds are normal. There is no tenderness.  Musculoskeletal: She exhibits no edema or tenderness.  Lymphadenopathy:    She has no cervical adenopathy.  Skin: No rash noted. No erythema.  Psychiatric: She has a normal mood and affect. Her behavior is normal.    BP 130/76 (BP Location: Right Arm, Patient Position: Sitting, Cuff Size: Normal)   Pulse 72   Temp 98.6 F (37 C) (Oral)   Resp 16   Wt 117 lb 8 oz (53.3 kg)   SpO2 99%   BMI 20.17 kg/m  Wt Readings from Last 3 Encounters:  10/11/16 117 lb 8 oz (53.3 kg)  08/06/16 119 lb 3.2 oz (54.1 kg)  06/14/16 117 lb 6.4 oz (53.3 kg)     Lab Results  Component Value Date   WBC 7.1 06/14/2016   HGB 12.2 07/09/2016   HCT 35.0 (L) 06/14/2016   PLT 153.0 06/14/2016   GLUCOSE 100 (H) 10/14/2015   CHOL 187 10/14/2015   TRIG 78.0 10/14/2015   HDL 61.70 10/14/2015   LDLCALC 110 (H) 10/14/2015   ALT 11 10/14/2015   AST 22 10/14/2015   NA 142 10/14/2015   K 3.6 10/14/2015   CL 107 10/14/2015   CREATININE 0.83 10/14/2015   BUN 18 10/14/2015   CO2 29 10/14/2015   TSH 2.22 10/14/2015    Mm Screening Breast Tomo Bilateral  Result Date: 03/02/2016 CLINICAL DATA:  Screening. EXAM: 2D DIGITAL SCREENING BILATERAL MAMMOGRAM WITH CAD AND ADJUNCT TOMO COMPARISON:  Previous exam(s). ACR Breast Density Category d: The breast tissue is extremely dense, which lowers the  sensitivity of mammography. FINDINGS: There are no findings suspicious for malignancy. Images were processed with CAD. IMPRESSION: No mammographic evidence of malignancy. A result letter of this screening mammogram will be mailed directly to the patient. RECOMMENDATION: Screening mammogram in one year. (Code:SM-B-01Y) BI-RADS CATEGORY  1: Negative. Electronically Signed   By: Ammie Ferrier M.D.   On: 03/02/2016 11:52       Assessment & Plan:   Problem List Items Addressed This Visit    Anxiety    Discussed with her today.  Same medication regimen.  Follow.       Relevant  Medications   ALPRAZolam (XANAX) 0.25 MG tablet   Leukopenia    Follow cbc.       Migraine headache    Followed by neurology.  Has tried multiple medications.  Currently on inderal and topamax.  Prednisone did help.  Has f/u planned 10/22/16.  Given persistent headache, will obtain MRI.  Discussed with her regarding rebound headaches.  Takes maxalt and ibuprofen daily.  Try to decrease use.  Follow.         Other Visit Diagnoses    Nonintractable headache, unspecified chronicity pattern, unspecified headache type    -  Primary   Relevant Orders   Sedimentation rate   Encounter for immunization       Relevant Medications   ALPRAZolam (XANAX) 0.25 MG tablet   Other Relevant Orders   Flu vaccine HIGH DOSE PF (Completed)       Einar Pheasant, MD

## 2016-10-12 ENCOUNTER — Telehealth: Payer: Self-pay | Admitting: Internal Medicine

## 2016-10-12 DIAGNOSIS — R519 Headache, unspecified: Secondary | ICD-10-CM

## 2016-10-12 DIAGNOSIS — R51 Headache: Principal | ICD-10-CM

## 2016-10-12 NOTE — Telephone Encounter (Signed)
Called pt back states in her office visit you discussed getting MRI of the brain. I did not see order informed patient you were out of office today and we would call back with the information next week.

## 2016-10-12 NOTE — Telephone Encounter (Signed)
Pt called and was wondering if Dr. Nicki Reaper had put in an order for a MRI of the brain in regards to her headaches. Please advise, thank you!  Call pt @ [phone]

## 2016-10-14 ENCOUNTER — Encounter: Payer: Self-pay | Admitting: Internal Medicine

## 2016-10-14 NOTE — Telephone Encounter (Signed)
Order placed for MRI brain.  

## 2016-10-14 NOTE — Assessment & Plan Note (Addendum)
Followed by neurology.  Has tried multiple medications.  Currently on inderal and topamax.  Prednisone did help.  Has f/u planned 10/22/16.  Given persistent headache, will obtain MRI.  Discussed with her regarding rebound headaches.  Takes maxalt and ibuprofen daily.  Try to decrease use.  Follow.

## 2016-10-14 NOTE — Telephone Encounter (Signed)
Order placed for MRI brain.  Referral coordinator to call pt.

## 2016-10-14 NOTE — Assessment & Plan Note (Signed)
Discussed with her today.  Same medication regimen.  Follow.

## 2016-10-14 NOTE — Assessment & Plan Note (Signed)
Follow [facility]bc.  

## 2016-10-16 ENCOUNTER — Other Ambulatory Visit (INDEPENDENT_AMBULATORY_CARE_PROVIDER_SITE_OTHER): Payer: Medicare Other

## 2016-10-16 ENCOUNTER — Encounter: Payer: Self-pay | Admitting: Internal Medicine

## 2016-10-16 ENCOUNTER — Other Ambulatory Visit: Payer: Self-pay | Admitting: Internal Medicine

## 2016-10-16 DIAGNOSIS — R519 Headache, unspecified: Secondary | ICD-10-CM

## 2016-10-16 DIAGNOSIS — R51 Headache: Secondary | ICD-10-CM

## 2016-10-16 LAB — SEDIMENTATION RATE: Sed Rate: 4 mm/hr (ref 0–30)

## 2016-10-16 NOTE — Addendum Note (Signed)
Addended by: Leeanne Rio on: 10/16/2016 09:36 AM   Modules accepted: Orders

## 2016-10-16 NOTE — Progress Notes (Signed)
Orders placed for stat BUN/Cr for MRI.

## 2016-10-19 ENCOUNTER — Other Ambulatory Visit
Admission: RE | Admit: 2016-10-19 | Discharge: 2016-10-19 | Disposition: A | Discharge status: Home / Self Care | Payer: Medicare Other | Source: Ambulatory Visit | Attending: Internal Medicine | Admitting: Internal Medicine

## 2016-10-19 ENCOUNTER — Ambulatory Visit
Admission: RE | Admit: 2016-10-19 | Discharge: 2016-10-19 | Disposition: A | Discharge status: Home / Self Care | Payer: Medicare Other | Source: Ambulatory Visit | Attending: Internal Medicine | Admitting: Internal Medicine

## 2016-10-19 DIAGNOSIS — R51 Headache: Secondary | ICD-10-CM | POA: Insufficient documentation

## 2016-10-19 DIAGNOSIS — J0130 Acute sphenoidal sinusitis, unspecified: Secondary | ICD-10-CM | POA: Diagnosis not present

## 2016-10-19 DIAGNOSIS — R519 Headache, unspecified: Secondary | ICD-10-CM

## 2016-10-19 DIAGNOSIS — J323 Chronic sphenoidal sinusitis: Secondary | ICD-10-CM | POA: Diagnosis not present

## 2016-10-19 LAB — CREATININE, SERUM
CREATININE: 0.81 mg/dL (ref 0.44–1.00)
GFR calc non Af Amer: >60 mL/min (ref >60)

## 2016-10-19 LAB — BUN: BUN: 15 mg/dL (ref 6–20)

## 2016-10-19 MED ORDER — GADOBENATE DIMEGLUMINE 529 MG/ML IV SOLN
10.0000 mL | Freq: Once | INTRAVENOUS | Status: AC | PRN
  Administered 2016-10-19: 10 mL via INTRAVENOUS

## 2016-10-22 DIAGNOSIS — G4701 Insomnia due to medical condition: Secondary | ICD-10-CM | POA: Diagnosis not present

## 2016-10-22 DIAGNOSIS — G4723 Circadian rhythm sleep disorder, irregular sleep wake type: Secondary | ICD-10-CM | POA: Diagnosis not present

## 2016-10-22 DIAGNOSIS — G43011 Migraine without aura, intractable, with status migrainosus: Secondary | ICD-10-CM | POA: Diagnosis not present

## 2016-10-22 DIAGNOSIS — G43719 Chronic migraine without aura, intractable, without status migrainosus: Secondary | ICD-10-CM | POA: Diagnosis not present

## 2016-10-23 ENCOUNTER — Other Ambulatory Visit: Payer: Self-pay | Admitting: Internal Medicine

## 2016-10-23 DIAGNOSIS — R519 Headache, unspecified: Secondary | ICD-10-CM

## 2016-10-23 DIAGNOSIS — R51 Headache: Principal | ICD-10-CM

## 2016-10-23 NOTE — Progress Notes (Signed)
Order placed for ENT referral.   

## 2016-10-26 ENCOUNTER — Ambulatory Visit: Payer: Medicare Other

## 2016-10-31 ENCOUNTER — Other Ambulatory Visit: Payer: Self-pay

## 2016-10-31 ENCOUNTER — Telehealth: Payer: Self-pay | Admitting: Internal Medicine

## 2016-10-31 MED ORDER — RABEPRAZOLE SODIUM 20 MG PO TBEC: 20.0000 mg | DELAYED_RELEASE_TABLET | Freq: Every day | ORAL | 0 refills | Status: DC

## 2016-10-31 NOTE — Telephone Encounter (Signed)
Pt called about still not feeling well and she was told that she could call and ask for a antibiotic per Dr Nicki Reaper. Please advise?   Pharmacy is [facility] [place], [address] AT [address]  Call pt @ [phone].

## 2016-10-31 NOTE — Telephone Encounter (Signed)
Pt called back returning your call. Thank you!  Call pt @ Call pt @ [phone]

## 2016-10-31 NOTE — Telephone Encounter (Signed)
lmtrc

## 2016-10-31 NOTE — Telephone Encounter (Signed)
Pt states that she has tried otc meds not helping. She is having headache, nasal congestion and a lot pressure. No fever, no N&V, no changes in appetite. She is not having any drainage she is having sore throat. Pt states that if it was not better you would call in antibiotic. She would like to get that done. Pt informed you are out of office but are checking messages and will not have answer today.   Allergies: sulfa  [facility] in [place]

## 2016-11-01 ENCOUNTER — Other Ambulatory Visit: Payer: Self-pay

## 2016-11-01 MED ORDER — AMOXICILLIN 875 MG PO TABS: 875.0000 mg | ORAL_TABLET | Freq: Two times a day (BID) | ORAL | 0 refills | Status: DC

## 2016-11-01 NOTE — Telephone Encounter (Signed)
Pt informed meds called in she will call if any questions.

## 2016-11-01 NOTE — Telephone Encounter (Signed)
Previous MRI with some sinus changes.  I had referred her to ENT.  Has she received appt date?  Given persistent symptoms, confirm no allergy to amoxicillin.  If no, then amoxicillin 875mg  bid x 10 days.  Will need rx sent in.  Notify her that she is to take a probiotic while on amoxicillin and continue for two weeks after completing abx.  (align daily).

## 2016-11-13 ENCOUNTER — Ambulatory Visit (INDEPENDENT_AMBULATORY_CARE_PROVIDER_SITE_OTHER): Payer: Medicare Other

## 2016-11-13 VITALS — BP 129/72 | HR 70 | Temp 97.9°F | Resp 12 | Ht 64.0 in | Wt 119.8 lb

## 2016-11-13 DIAGNOSIS — Z0000 Encounter for general adult medical examination without abnormal findings: Secondary | ICD-10-CM

## 2016-11-13 DIAGNOSIS — Z23 Encounter for immunization: Secondary | ICD-10-CM

## 2016-11-13 NOTE — Patient Instructions (Addendum)
  Brandi Ramos , Thank you for taking time to come for your Medicare Wellness Visit. I appreciate your ongoing commitment to your health goals. Please review the following plan we discussed and let me know if I can assist you in the future.   Follow up with Dr. Nicki Reaper as needed.    Have a great day!  These are the goals we discussed: Goals    . Increase physical activity    . Increase water intake          Stay hydrated Finish the entire cup of water when taking medication       This is a list of the screening recommended for you and due dates:  Health Maintenance  Topic Date Due  .  Hepatitis C: One time screening is recommended by Center for Disease Control  (CDC) for  adults born from [birth year] through 1963.   [birth year]  . Tetanus Vaccine  07/07/1967  . DEXA scan (bone density measurement)  07/06/2013  . Pneumonia vaccines (1 of 2 - PCV13) 07/06/2013  . Mammogram  03/02/2017  . Colon Cancer Screening  10/14/2023  . Flu Shot  Completed    Bone Densitometry Bone densitometry is an imaging test that uses a special X-ray to measure the amount of calcium and other minerals in your bones (bone density). This test is also known as a bone mineral density test or dual-energy X-ray absorptiometry (DXA). The test can measure bone density at your hip and your spine. It is similar to having a regular X-ray. You may have this test to:  Diagnose a condition that causes weak or thin bones (osteoporosis).  Predict your risk of a broken bone (fracture).  Determine how well osteoporosis treatment is working. Tell a health care provider about:  Any allergies you have.  All medicines you are taking, including vitamins, herbs, eye drops, creams, and over-the-counter medicines.  Any problems you or family members have had with anesthetic medicines.  Any blood disorders you have.  Any surgeries you have had.  Any medical conditions you have.  Possibility of pregnancy.  Any other medical  test you had within the previous 14 days that used contrast material. What are the risks? Generally, this is a safe procedure. However, problems can occur and may include the following:  This test exposes you to a very small amount of radiation.  The risks of radiation exposure may be greater to unborn children. What happens before the procedure?  Do not take any calcium supplements for 24 hours before having the test. You can otherwise eat and drink what you usually do.  Take off all metal jewelry, eyeglasses, dental appliances, and any other metal objects. What happens during the procedure?  You may lie on an exam table. There will be an X-ray generator below you and an imaging device above you.  Other devices, such as boxes or braces, may be used to position your body properly for the scan.  You will need to lie still while the machine slowly scans your body.  The images will show up on a computer monitor. What happens after the procedure? You may need more testing at a later time. This information is not intended to replace advice given to you by your health care provider. Make sure you discuss any questions you have with your health care provider. Document Released: 09/18/2004 Document Revised: 02/02/2016 Document Reviewed: 02/04/2014 Elsevier Interactive Patient Education  2017 [employer].

## 2016-11-13 NOTE — Progress Notes (Signed)
Subjective:   Brandi Ramos is a 71 y.o. female who presents for an Initial Medicare Annual Wellness Visit.  Review of Systems    No ROS.  Medicare Wellness Visit.  Cardiac Risk Factors include: advanced age (>51men, >50 women)     Objective:    Today's Vitals   11/13/16 0904  BP: 129/72  Pulse: 70  Resp: 12  Temp: 97.9 F (36.6 C)  TempSrc: Oral  SpO2: 99%  Weight: 119 lb 12.8 oz (54.3 kg)  Height: 5\' 4"  (1.626 m)   Body mass index is 20.56 kg/m.   Current Medications (verified) Outpatient Encounter Prescriptions as of 11/13/2016  Medication Sig  . ALPRAZolam (XANAX) 0.25 MG tablet Take 1 tablet (0.25 mg total) by mouth daily as needed. for anxiety  . benzonatate (TESSALON) 100 MG capsule Take 1 capsule (100 mg total) by mouth 2 (two) times daily as needed for cough.  . fluticasone (FLONASE) 50 MCG/ACT nasal spray SHAKE LIQUID AND USE 2 SPRAYS IN EACH NOSTRIL DAILY  . LINZESS 145 MCG CAPS capsule Take 145 mcg by mouth daily.  Marland Kitchen loratadine (CLARITIN) 10 MG tablet Take 10 mg by mouth daily.  . propranolol (INDERAL) 40 MG tablet TAKE 1 TABLET DAILY  . RABEprazole (ACIPHEX) 20 MG tablet Take 1 tablet (20 mg total) by mouth daily.  . rizatriptan (MAXALT) 10 MG tablet Take 10 mg by mouth as needed. May repeat in 2 hours if needed  . sucralfate (CARAFATE) 1 G tablet Take 1 tablet by mouth 2 (two) times daily as needed.  . topiramate (TOPAMAX) 50 MG tablet Take 150 mg by mouth at bedtime.  . [DISCONTINUED] amoxicillin (AMOXIL) 875 MG tablet Take 1 tablet (875 mg total) by mouth 2 (two) times daily.  . DULoxetine (CYMBALTA) 60 MG capsule TAKE 1 CAPSULE DAILY (Patient not taking: Reported on 11/13/2016)   No facility-administered encounter medications on file as of 11/13/2016.     Allergies (verified) Amitiza [lubiprostone] and Sulfa antibiotics   History: Past Medical History:  Diagnosis Date  . Anxiety   . Arthritis   . Cancer ([place])    skin  . Chicken pox   .  Diverticulosis   . Fibrocystic breast disease   . GERD (gastroesophageal reflux disease)   . Migraine headache   . Neutropenia ([place])    worked up - Dr Oliva Bustard, slightly elevated ANA  . PUD (peptic ulcer disease)    Past Surgical History:  Procedure Laterality Date  . ABDOMINAL HYSTERECTOMY  1984  . ESOPHAGOGASTRODUODENOSCOPY (EGD) WITH PROPOFOL N/A 10/31/2015   Procedure: ESOPHAGOGASTRODUODENOSCOPY (EGD) WITH PROPOFOL;  Surgeon: Lollie Sails, MD;  Location[facility] ENDOSCOPY;  Service: Endoscopy;  Laterality: N/A;   Family History  Problem Relation Age of Onset  . CAD Mother     s/p CABG  . Hypercholesterolemia Mother   . Breast cancer Mother 44  . Hypertension Father   . Migraines Father   . Hypercholesterolemia Father   . Migraines Sister   . Lymphoma      grandmother  . Colon cancer Neg Hx    Social History   Occupational History  . Not on file.   Social History Main Topics  . Smoking status: Never Smoker  . Smokeless tobacco: Never Used  . Alcohol use No     Comment: wine occassionally  . Drug use: No  . Sexual activity: Not Currently    Tobacco Counseling Counseling given: Not Answered   Activities of Daily Living In your  present state of health, do you have any difficulty performing the following activities: 11/13/2016  Hearing? N  Vision? N  Difficulty concentrating or making decisions? N  Walking or climbing stairs? N  Dressing or bathing? N  Doing errands, shopping? N  Preparing Food and eating ? N  Using the Toilet? N  In the past six months, have you accidently leaked urine? N  Do you have problems with loss of bowel control? Y  Managing your Medications? N  Managing your Finances? N  Housekeeping or managing your Housekeeping? N  Some recent data might be hidden    Immunizations and Health Maintenance Immunization History  Administered Date(s) Administered  . Influenza Split 06/15/2014  . Influenza, High Dose Seasonal PF 10/11/2016  .  Influenza,inj,Quad PF,36+ Mos 07/01/2013, 06/27/2015  . Pneumococcal Conjugate-13 11/13/2016  . Zoster 05/11/2014   Health Maintenance Due  Topic Date Due  . Hepatitis C Screening  Jan 28, 1946  . TETANUS/TDAP  07/07/1967  . DEXA SCAN  07/06/2013  . PNA vac Low Risk Adult (1 of 2 - PCV13) 07/06/2013    Patient Care Team: Einar Pheasant, MD as PCP - General (Internal Medicine)  Indicate any recent Medical Services you may have received from other than [facility] providers in the past year (date may be approximate).     Assessment:   This is a routine wellness examination for Brandi Ramos. The goal of the wellness visit is to assist the patient how to close the gaps in care and create a preventative care plan for the patient.   Osteoporosis risk reviewed.  Medications reviewed; taking without issues or barriers.  Safety issues reviewed; lives alone. Smoke detectors in the home. No firearms in the home. Wears seatbelts when driving or riding with others. Patient does wear sunscreen or protective clothing when in direct sunlight. No violence in the home.  Patient is alert, normal appearance, oriented to person/place/and time. Correctly identified the president of the [place], recall of 3/3 objects, and performing simple calculations.  Patient displays appropriate judgement and can read correct time from watch face.  No new identified risk were noted.  No failures at ADL's or IADL's.   BMI- discussed the importance of a healthy diet, water intake and exercise. Educational material provided.   Diet: Breakfast: Cereal Lunch: Yogurt, fruit, protein bar  Dinner: Meat, green vegetable Daily fluid intake: 2 cups of caffeine, 3 cups of water  Dental- every six months.  Dr. Bernerd Limbo.  Sleep patterns- Sleeps 7 hours at night.  Wakes feeling rested.  Prevnar 13 vaccine administered R deltoid, tolerated well. Educational material provided. . TDAP vaccine deferred per patient preference.   Follow up with insurance.  Educational material provided.  Patient Concerns: None at this time. Follow up with PCP as needed.  Hearing/Vision screen Hearing Screening Comments: Patient is able to hear conversational tones without difficulty.  No issues reported.   Vision Screening Comments: Followed by Dr. Ellin Mayhew and [facility]  Appointment with Dr. Jeni Salles on 11/16/16 Wears corrective lenses Last OV 2017 Visual acuity not assessed per patient preference since they have regular follow up with the ophthalmologist  Dietary issues and exercise activities discussed: Current Exercise Habits: Home exercise routine, Type of exercise: walking, Time (Minutes): 45, Frequency (Times/Week): 3, Weekly Exercise (Minutes/Week): 135, Intensity: Moderate  Goals    . Increase physical activity    . Increase water intake          Stay hydrated Finish the entire cup of water when  taking medication      Depression Screen PHQ 2/9 Scores 11/13/2016 06/14/2016 01/05/2016 12/17/2014 10/19/2013  PHQ - 2 Score 0 0 0 0 1  PHQ- 9 Score - - 3 - -    Fall Risk Fall Risk  11/13/2016 06/14/2016 01/05/2016 12/17/2014 10/19/2013  Falls in the past year? No No No No No    Cognitive Function: MMSE - Mini Mental State Exam 11/13/2016  Orientation to time 5  Orientation to Place 5  Registration 3  Attention/ Calculation 5  Recall 3  Language- name 2 objects 2  Language- repeat 1  Language- follow 3 step command 3  Language- read & follow direction 1  Write a sentence 1  Copy design 1  Total score 30        Screening Tests Health Maintenance  Topic Date Due  . Hepatitis C Screening  24-Jan-1946  . TETANUS/TDAP  07/07/1967  . DEXA SCAN  07/06/2013  . PNA vac Low Risk Adult (1 of 2 - PCV13) 07/06/2013  . MAMMOGRAM  03/02/2017  . COLONOSCOPY  10/14/2023  . INFLUENZA VACCINE  Completed      Plan:    End of life planning; Advanced aging; Advanced directives discussed.  No HCPOA/Living Will.   Additional information declined at this time.  Medicare Attestation I have personally reviewed: The patient's medical and social history Their use of alcohol, tobacco or illicit drugs Their current medications and supplements The patient's functional ability including ADLs,fall risks, home safety risks, cognitive, and hearing and visual impairment Diet and physical activities Evidence for depression   The patient's weight, height, BMI, and visual acuity have been recorded in the chart.  I have made referrals and provided education to the patient based on review of the above and I have provided the patient with a written personalized care plan for preventive services.    During the course of the visit, Brandi Ramos was educated and counseled about the following appropriate screening and preventive services:   Vaccines to include Pneumoccal, Influenza, Hepatitis B, Td, Zostavax, HCV  Colorectal cancer screening-UTD  Bone density screening-educational material provided  Glaucoma screening-annual eye exam  Mammography/PAP-UTD  Nutrition counseling  Patient Instructions (the written plan) were given to the patient.    Varney Biles, LPN   [id number]    Reviewed above information.  Agree with plan.  Dr Nicki Reaper

## 2016-11-15 DIAGNOSIS — J342 Deviated nasal septum: Secondary | ICD-10-CM | POA: Diagnosis not present

## 2016-11-15 DIAGNOSIS — R51 Headache: Secondary | ICD-10-CM | POA: Diagnosis not present

## 2016-11-27 DIAGNOSIS — H2513 Age-related nuclear cataract, bilateral: Secondary | ICD-10-CM | POA: Diagnosis not present

## 2016-12-13 ENCOUNTER — Ambulatory Visit (INDEPENDENT_AMBULATORY_CARE_PROVIDER_SITE_OTHER): Payer: Medicare Other | Admitting: Internal Medicine

## 2016-12-13 ENCOUNTER — Encounter: Payer: Self-pay | Admitting: Internal Medicine

## 2016-12-13 DIAGNOSIS — K2960 Other gastritis without bleeding: Secondary | ICD-10-CM | POA: Diagnosis not present

## 2016-12-13 DIAGNOSIS — Z9109 Other allergy status, other than to drugs and biological substances: Secondary | ICD-10-CM | POA: Diagnosis not present

## 2016-12-13 DIAGNOSIS — D72819 Decreased white blood cell count, unspecified: Secondary | ICD-10-CM

## 2016-12-13 DIAGNOSIS — F419 Anxiety disorder, unspecified: Secondary | ICD-10-CM | POA: Diagnosis not present

## 2016-12-13 DIAGNOSIS — G43709 Chronic migraine without aura, not intractable, without status migrainosus: Secondary | ICD-10-CM | POA: Diagnosis not present

## 2016-12-13 MED ORDER — FLUTICASONE PROPIONATE 50 MCG/ACT NA SUSP: NASAL | 11 refills | Status: DC

## 2016-12-13 MED ORDER — DULOXETINE HCL 60 MG PO CPEP: 60.0000 mg | ORAL_CAPSULE | Freq: Every day | ORAL | 1 refills | Status: DC

## 2016-12-13 MED ORDER — ALPRAZOLAM 0.25 MG PO TABS: 0.2500 mg | ORAL_TABLET | Freq: Every day | ORAL | 1 refills | Status: DC | PRN

## 2016-12-13 NOTE — Progress Notes (Signed)
Pre-visit discussion using our clinic review tool. No additional management support is needed unless otherwise documented below in the visit note.  

## 2016-12-13 NOTE — Progress Notes (Signed)
Patient ID: Brandi Ramos, female   DOB: 1945/12/23, 71 y.o.   MRN: [id number]   Subjective:    Patient ID: Brandi Ramos, female    DOB: 1946/02/01, 71 y.o.   MRN: [id number]  HPI  Patient here for a scheduled follow up.  She still reports some increased stress and anxiety, but overall feels better.  Headaches not as frequent.  Saw psychiatry.  Seeing neurology.  Staying active.  No chest pain.  No sob.  No acid reflux.  No abdominal pain.  Bowels moving.     Past Medical History:  Diagnosis Date  . Anxiety   . Arthritis   . Cancer ([place])    skin  . Chicken pox   . Diverticulosis   . Fibrocystic breast disease   . GERD (gastroesophageal reflux disease)   . Migraine headache   . Neutropenia ([place])    worked up - Dr Oliva Bustard, slightly elevated ANA  . PUD (peptic ulcer disease)    Past Surgical History:  Procedure Laterality Date  . ABDOMINAL HYSTERECTOMY  1984  . ESOPHAGOGASTRODUODENOSCOPY (EGD) WITH PROPOFOL N/A 10/31/2015   Procedure: ESOPHAGOGASTRODUODENOSCOPY (EGD) WITH PROPOFOL;  Surgeon: Lollie Sails, MD;  Location: [facility] ENDOSCOPY;  Service: Endoscopy;  Laterality: N/A;   Family History  Problem Relation Age of Onset  . CAD Mother     s/p CABG  . Hypercholesterolemia Mother   . Breast cancer Mother 50  . Hypertension Father   . Migraines Father   . Hypercholesterolemia Father   . Migraines Sister   . Lymphoma      grandmother  . Colon cancer Neg Hx    Social History   Social History  . Marital status: Widowed    Spouse name: N/A  . Number of children: 2  . Years of education: N/A   Social History Main Topics  . Smoking status: Never Smoker  . Smokeless tobacco: Never Used  . Alcohol use No     Comment: wine occassionally  . Drug use: No  . Sexual activity: Not Currently   Other Topics Concern  . None   Social History Narrative  . None    Outpatient Encounter Prescriptions as of 12/13/2016  Medication Sig  . DULoxetine (CYMBALTA) 60 MG  capsule Take 1 capsule (60 mg total) by mouth daily.  . fluticasone (FLONASE) 50 MCG/ACT nasal spray SHAKE LIQUID AND USE 2 SPRAYS IN EACH NOSTRIL DAILY  . LINZESS 145 MCG CAPS capsule Take 145 mcg by mouth daily.  Marland Kitchen loratadine (CLARITIN) 10 MG tablet Take 10 mg by mouth daily.  . propranolol (INDERAL) 40 MG tablet TAKE 1 TABLET DAILY  . RABEprazole (ACIPHEX) 20 MG tablet Take 1 tablet (20 mg total) by mouth daily.  . rizatriptan (MAXALT) 10 MG tablet Take 10 mg by mouth as needed. May repeat in 2 hours if needed  . sucralfate (CARAFATE) 1 G tablet Take 1 tablet by mouth 2 (two) times daily as needed.  . topiramate (TOPAMAX) 50 MG tablet Take 150 mg by mouth at bedtime.  . [DISCONTINUED] ALPRAZolam (XANAX) 0.25 MG tablet Take 1 tablet (0.25 mg total) by mouth daily as needed. for anxiety  . [DISCONTINUED] DULoxetine (CYMBALTA) 60 MG capsule TAKE 1 CAPSULE DAILY  . [DISCONTINUED] fluticasone (FLONASE) 50 MCG/ACT nasal spray SHAKE LIQUID AND USE 2 SPRAYS IN EACH NOSTRIL DAILY  . ALPRAZolam (XANAX) 0.25 MG tablet Take 1 tablet (0.25 mg total) by mouth daily as needed for anxiety.  . [DISCONTINUED] benzonatate (TESSALON)  100 MG capsule Take 1 capsule (100 mg total) by mouth 2 (two) times daily as needed for cough.   No facility-administered encounter medications on file as of 12/13/2016.     Review of Systems  Constitutional: Negative for appetite change and unexpected weight change.  HENT: Negative for congestion and sinus pressure.   Respiratory: Negative for cough, chest tightness and shortness of breath.   Cardiovascular: Negative for chest pain, palpitations and leg swelling.  Gastrointestinal: Negative for abdominal pain, diarrhea, nausea and vomiting.  Genitourinary: Negative for difficulty urinating and dysuria.  Musculoskeletal: Negative for back pain and joint swelling.  Skin: Negative for color change and rash.  Neurological: Positive for headaches. Negative for dizziness and  light-headedness.  Psychiatric/Behavioral: Negative for agitation and dysphoric mood.       Objective:    Physical Exam  Constitutional: She appears well-developed and well-nourished. No distress.  HENT:  Nose: Nose normal.  Mouth/Throat: Oropharynx is clear and moist.  Neck: Neck supple. No thyromegaly present.  Cardiovascular: Normal rate and regular rhythm.   Pulmonary/Chest: Breath sounds normal. No respiratory distress. She has no wheezes.  Abdominal: Soft. Bowel sounds are normal. There is no tenderness.  Musculoskeletal: She exhibits no edema or tenderness.  Lymphadenopathy:    She has no cervical adenopathy.  Skin: No rash noted. No erythema.  Psychiatric: She has a normal mood and affect. Her behavior is normal.    BP 120/70 (BP Location: Left Arm, Patient Position: Sitting, Cuff Size: Normal)   Pulse 74   Temp 98.4 F (36.9 C) (Oral)   Resp 12   Ht 5\' 4"  (1.626 m)   Wt 118 lb 6.4 oz (53.7 kg)   SpO2 98%   BMI 20.32 kg/m  Wt Readings from Last 3 Encounters:  12/13/16 118 lb 6.4 oz (53.7 kg)  11/13/16 119 lb 12.8 oz (54.3 kg)  10/11/16 117 lb 8 oz (53.3 kg)     Lab Results  Component Value Date   WBC 7.1 06/14/2016   HGB 12.2 07/09/2016   HCT 35.0 (L) 06/14/2016   PLT 153.0 06/14/2016   GLUCOSE 100 (H) 10/14/2015   CHOL 187 10/14/2015   TRIG 78.0 10/14/2015   HDL 61.70 10/14/2015   LDLCALC 110 (H) 10/14/2015   ALT 11 10/14/2015   AST 22 10/14/2015   NA 142 10/14/2015   K 3.6 10/14/2015   CL 107 10/14/2015   CREATININE 0.81 10/19/2016   BUN 15 10/19/2016   CO2 29 10/14/2015   TSH 2.22 10/14/2015    Mr Brain W BJ Contrast  Result Date: 10/19/2016 CLINICAL DATA:  71 year old female with persistent headaches. Daily headaches for the past 2 weeks. Initial encounter. EXAM: MRI HEAD WITHOUT AND WITH CONTRAST TECHNIQUE: Multiplanar, multiecho pulse sequences of the brain and surrounding structures were obtained without and with intravenous contrast.  CONTRAST:  46mL MULTIHANCE GADOBENATE DIMEGLUMINE 529 MG/ML IV SOLN COMPARISON:  None. FINDINGS: Brain: Cerebral volume is normal. No restricted diffusion to suggest acute infarction. No midline shift, mass effect, evidence of mass lesion, ventriculomegaly, extra-axial collection or acute intracranial hemorrhage. Cervicomedullary junction and pituitary are within normal limits. The suprasellar cistern is diminutive but appears to remain patent. Other basilar cisterns are within normal limits. Pearline Cables and white matter signal is within normal limits for age throughout the brain. No cortical encephalomalacia or chronic cerebral blood products. No abnormal enhancement identified. No dural thickening. Vascular: Major intracranial vascular flow voids are preserved in appear normal. Skull and upper cervical spine: Negative.  Normal bone marrow signal. Sinuses/Orbits: Complete or near complete opacification of the right sphenoid sinus with inspissated material. The remaining paranasal sinuses are clear. Normal orbits soft tissues. Other: Visible internal auditory structures appear normal. Mastoids are clear. Negative scalp soft tissues. IMPRESSION: 1. Normal for age MRI appearance of the brain. 2. Right sphenoid sinusitis, which could be acute on chronic or simply chronic. Electronically Signed   By: Genevie Ann M.D.   On: 10/19/2016 12:09       Assessment & Plan:   Problem List Items Addressed This Visit    Anxiety    Some better.  Discussed with her today.  Does not feel needs anything more at this time.  Follow.        Relevant Medications   ALPRAZolam (XANAX) 0.25 MG tablet   DULoxetine (CYMBALTA) 60 MG capsule   Environmental allergies    Seeing ENT for persistent sinus and allergy issues.  Follow.       Gastritis, bile acid reflux    On carafate and aciphex.  Seeing GI.  Stable.       Leukopenia    White blood cell count in 06/2016 - wnl.  Follow.       Migraine headache    Followed by neurology.   Currently on inderal and topamax.  Not as frequent.  Follow.       Relevant Medications   DULoxetine (CYMBALTA) 60 MG capsule       Einar Pheasant, MD

## 2016-12-16 ENCOUNTER — Encounter: Payer: Self-pay | Admitting: Internal Medicine

## 2016-12-16 NOTE — Assessment & Plan Note (Signed)
White blood cell count in 06/2016 - wnl.  Follow.

## 2016-12-16 NOTE — Assessment & Plan Note (Signed)
Some better.  Discussed with her today.  Does not feel needs anything more at this time.  Follow.

## 2016-12-16 NOTE — Assessment & Plan Note (Signed)
Seeing ENT for persistent sinus and allergy issues.  Follow.

## 2016-12-16 NOTE — Assessment & Plan Note (Signed)
On carafate and aciphex.  Seeing GI.  Stable.

## 2016-12-16 NOTE — Assessment & Plan Note (Signed)
Followed by neurology.  Currently on inderal and topamax.  Not as frequent.  Follow.

## 2016-12-26 DIAGNOSIS — H02403 Unspecified ptosis of bilateral eyelids: Secondary | ICD-10-CM | POA: Diagnosis not present

## 2017-01-28 DIAGNOSIS — G43719 Chronic migraine without aura, intractable, without status migrainosus: Secondary | ICD-10-CM | POA: Diagnosis not present

## 2017-01-28 DIAGNOSIS — G4723 Circadian rhythm sleep disorder, irregular sleep wake type: Secondary | ICD-10-CM | POA: Diagnosis not present

## 2017-01-28 DIAGNOSIS — G4701 Insomnia due to medical condition: Secondary | ICD-10-CM | POA: Diagnosis not present

## 2017-02-15 ENCOUNTER — Ambulatory Visit: Payer: Medicare Other | Admitting: Internal Medicine

## 2017-02-25 ENCOUNTER — Other Ambulatory Visit: Payer: Self-pay | Admitting: Internal Medicine

## 2017-02-25 NOTE — Telephone Encounter (Signed)
Patient was last seen on 12/14/2106 and was given a rx for this, #30 with one refill taking once a day, Please advise in Dr. Bary Leriche absence, thanks

## 2017-02-26 NOTE — Telephone Encounter (Signed)
Refill for 30 days only.  Future refills to be done by Dr Nicki Reaper

## 2017-02-26 NOTE — Telephone Encounter (Signed)
Printed, signed and faxed.  

## 2017-03-28 ENCOUNTER — Other Ambulatory Visit: Payer: Self-pay | Admitting: Internal Medicine

## 2017-03-28 NOTE — Telephone Encounter (Signed)
Script faxed to [facility]

## 2017-03-29 ENCOUNTER — Other Ambulatory Visit: Payer: Self-pay | Admitting: Internal Medicine

## 2017-03-29 DIAGNOSIS — Z1231 Encounter for screening mammogram for malignant neoplasm of breast: Secondary | ICD-10-CM

## 2017-04-16 DIAGNOSIS — M7501 Adhesive capsulitis of right shoulder: Secondary | ICD-10-CM | POA: Diagnosis not present

## 2017-04-18 ENCOUNTER — Other Ambulatory Visit: Payer: Self-pay | Admitting: Internal Medicine

## 2017-04-29 ENCOUNTER — Telehealth: Payer: Self-pay | Admitting: Internal Medicine

## 2017-04-29 NOTE — Telephone Encounter (Signed)
Patient would like to get this script filled today if possible

## 2017-04-29 NOTE — Telephone Encounter (Signed)
Refill for 30 days only.  OFFICE VISIT NEEDED ITH DR Nicki Reaper prior to any more refills

## 2017-04-29 NOTE — Telephone Encounter (Signed)
Last OV 12/13/16 and last refill 03/27/17 ok to fill? Has scheduled FU appointment 9/18 with PCP.

## 2017-04-29 NOTE — Telephone Encounter (Signed)
Spoke with the patient notified of the refill and to follow up with Dr. Nicki Reaper. thanks

## 2017-04-30 NOTE — Telephone Encounter (Signed)
Pt called and stated that the pharmacy still does not have this rx. Please advise, thank you!  Pharmacy - [facility] [place], [place]

## 2017-04-30 NOTE — Telephone Encounter (Signed)
refaxed to pharmacy, thanks

## 2017-05-17 ENCOUNTER — Encounter: Payer: Self-pay | Admitting: Internal Medicine

## 2017-05-17 ENCOUNTER — Ambulatory Visit (INDEPENDENT_AMBULATORY_CARE_PROVIDER_SITE_OTHER): Payer: Medicare Other | Admitting: Internal Medicine

## 2017-05-17 DIAGNOSIS — D72819 Decreased white blood cell count, unspecified: Secondary | ICD-10-CM

## 2017-05-17 DIAGNOSIS — F419 Anxiety disorder, unspecified: Secondary | ICD-10-CM | POA: Diagnosis not present

## 2017-05-17 DIAGNOSIS — Z23 Encounter for immunization: Secondary | ICD-10-CM

## 2017-05-17 DIAGNOSIS — G43709 Chronic migraine without aura, not intractable, without status migrainosus: Secondary | ICD-10-CM | POA: Diagnosis not present

## 2017-05-17 MED ORDER — ALPRAZOLAM 0.25 MG PO TABS: ORAL_TABLET | ORAL | 1 refills | Status: DC

## 2017-05-17 NOTE — Progress Notes (Signed)
Patient ID: Brandi Ramos, female   DOB: 05/13/1946, 71 y.o.   MRN: [id number]   Subjective:    Patient ID: Brandi Ramos, female    DOB: 01-06-1946, 71 y.o.   MRN: [id number]  HPI  Patient here for a scheduled follow up. She reports she is doing relatively well.  Stays active.  No chest pain.  No sob.  No acid reflux.  No abdominal pain.  Bowels moving.  No urine change.  Some increased stress/anxiety.  No panic attacks.  Takes xanax.  Controls.  Overall she feels things are stable.     Past Medical History:  Diagnosis Date  . Anxiety   . Arthritis   . Cancer ([place])    skin  . Chicken pox   . Diverticulosis   . Fibrocystic breast disease   . GERD (gastroesophageal reflux disease)   . Migraine headache   . Neutropenia ([place])    worked up - Dr Oliva Bustard, slightly elevated ANA  . PUD (peptic ulcer disease)    Past Surgical History:  Procedure Laterality Date  . ABDOMINAL HYSTERECTOMY  1984  . ESOPHAGOGASTRODUODENOSCOPY (EGD) WITH PROPOFOL N/A 10/31/2015   Procedure: ESOPHAGOGASTRODUODENOSCOPY (EGD) WITH PROPOFOL;  Surgeon: Lollie Sails, MD;  Location: [facility] ENDOSCOPY;  Service: Endoscopy;  Laterality: N/A;   Family History  Problem Relation Age of Onset  . CAD Mother        s/p CABG  . Hypercholesterolemia Mother   . Breast cancer Mother 61  . Hypertension Father   . Migraines Father   . Hypercholesterolemia Father   . Migraines Sister   . Lymphoma Unknown        grandmother  . Colon cancer Neg Hx    Social History   Social History  . Marital status: Widowed    Spouse name: N/A  . Number of children: 2  . Years of education: N/A   Social History Main Topics  . Smoking status: Never Smoker  . Smokeless tobacco: Never Used  . Alcohol use No     Comment: wine occassionally  . Drug use: No  . Sexual activity: Not Currently   Other Topics Concern  . None   Social History Narrative  . None    Outpatient Encounter Prescriptions as of 05/17/2017  Medication  Sig  . ALPRAZolam (XANAX) 0.25 MG tablet TAKE 1 TABLET BY MOUTH EVERY DAY AS NEEDED FOR ANXIETY  . DULoxetine (CYMBALTA) 60 MG capsule Take 1 capsule (60 mg total) by mouth daily.  . fluticasone (FLONASE) 50 MCG/ACT nasal spray SHAKE LIQUID AND USE 2 SPRAYS IN EACH NOSTRIL DAILY  . LINZESS 145 MCG CAPS capsule Take 145 mcg by mouth daily.  Marland Kitchen loratadine (CLARITIN) 10 MG tablet Take 10 mg by mouth daily.  . propranolol (INDERAL) 40 MG tablet TAKE 1 TABLET DAILY  . RABEprazole (ACIPHEX) 20 MG tablet TAKE 1 TABLET DAILY  . rizatriptan (MAXALT) 10 MG tablet Take 10 mg by mouth as needed. May repeat in 2 hours if needed  . sucralfate (CARAFATE) 1 G tablet Take 1 tablet by mouth 2 (two) times daily as needed.  . topiramate (TOPAMAX) 50 MG tablet Take 150 mg by mouth at bedtime.  . [DISCONTINUED] ALPRAZolam (XANAX) 0.25 MG tablet TAKE 1 TABLET BY MOUTH EVERY DAY AS NEEDED FOR ANXIETY   No facility-administered encounter medications on file as of 05/17/2017.     Review of Systems  Constitutional: Negative for appetite change and unexpected weight change.  HENT: Negative for  congestion and sinus pressure.   Respiratory: Negative for cough, chest tightness and shortness of breath.   Cardiovascular: Negative for chest pain, palpitations and leg swelling.  Gastrointestinal: Negative for abdominal pain, diarrhea, nausea and vomiting.  Genitourinary: Negative for difficulty urinating and dysuria.  Musculoskeletal: Negative for back pain and joint swelling.  Skin: Negative for color change and rash.  Neurological: Negative for dizziness, light-headedness and headaches.  Psychiatric/Behavioral: Negative for agitation and dysphoric mood.       Objective:    Physical Exam  Constitutional: She appears well-developed and well-nourished. No distress.  HENT:  Nose: Nose normal.  Mouth/Throat: Oropharynx is clear and moist.  Neck: Neck supple. No thyromegaly present.  Cardiovascular: Normal rate and  regular rhythm.   Pulmonary/Chest: Breath sounds normal. No respiratory distress. She has no wheezes.  Abdominal: Soft. Bowel sounds are normal. There is no tenderness.  Musculoskeletal: She exhibits no edema or tenderness.  Lymphadenopathy:    She has no cervical adenopathy.  Skin: No rash noted. No erythema.  Psychiatric: She has a normal mood and affect. Her behavior is normal.    BP 120/68 (BP Location: Left Arm, Patient Position: Sitting, Cuff Size: Normal)   Pulse 66   Temp 98.6 F (37 C) (Oral)   Resp 14   Ht 5\' 4"  (1.626 m)   Wt 117 lb (53.1 kg)   SpO2 98%   BMI 20.08 kg/m  Wt Readings from Last 3 Encounters:  05/17/17 117 lb (53.1 kg)  12/13/16 118 lb 6.4 oz (53.7 kg)  11/13/16 119 lb 12.8 oz (54.3 kg)     Lab Results  Component Value Date   WBC 7.1 06/14/2016   HGB 12.2 07/09/2016   HCT 35.0 (L) 06/14/2016   PLT 153.0 06/14/2016   GLUCOSE 100 (H) 10/14/2015   CHOL 187 10/14/2015   TRIG 78.0 10/14/2015   HDL 61.70 10/14/2015   LDLCALC 110 (H) 10/14/2015   ALT 11 10/14/2015   AST 22 10/14/2015   NA 142 10/14/2015   K 3.6 10/14/2015   CL 107 10/14/2015   CREATININE 0.81 10/19/2016   BUN 15 10/19/2016   CO2 29 10/14/2015   TSH 2.22 10/14/2015    Mr Brain W IO Contrast  Result Date: 10/19/2016 CLINICAL DATA:  71 year old female with persistent headaches. Daily headaches for the past 2 weeks. Initial encounter. EXAM: MRI HEAD WITHOUT AND WITH CONTRAST TECHNIQUE: Multiplanar, multiecho pulse sequences of the brain and surrounding structures were obtained without and with intravenous contrast. CONTRAST:  55mL MULTIHANCE GADOBENATE DIMEGLUMINE 529 MG/ML IV SOLN COMPARISON:  None. FINDINGS: Brain: Cerebral volume is normal. No restricted diffusion to suggest acute infarction. No midline shift, mass effect, evidence of mass lesion, ventriculomegaly, extra-axial collection or acute intracranial hemorrhage. Cervicomedullary junction and pituitary are within normal  limits. The suprasellar cistern is diminutive but appears to remain patent. Other basilar cisterns are within normal limits. Pearline Cables and white matter signal is within normal limits for age throughout the brain. No cortical encephalomalacia or chronic cerebral blood products. No abnormal enhancement identified. No dural thickening. Vascular: Major intracranial vascular flow voids are preserved in appear normal. Skull and upper cervical spine: Negative. Normal bone marrow signal. Sinuses/Orbits: Complete or near complete opacification of the right sphenoid sinus with inspissated material. The remaining paranasal sinuses are clear. Normal orbits soft tissues. Other: Visible internal auditory structures appear normal. Mastoids are clear. Negative scalp soft tissues. IMPRESSION: 1. Normal for age MRI appearance of the brain. 2. Right sphenoid sinusitis, which  could be acute on chronic or simply chronic. Electronically Signed   By: Genevie Ann M.D.   On: 10/19/2016 12:09       Assessment & Plan:   Problem List Items Addressed This Visit    Anxiety    No recent panic attacks.  Discussed with her today.  Takes xanax.  Follow.        Relevant Medications   ALPRAZolam (XANAX) 0.25 MG tablet   Leukopenia    Last white blood cell count wnl.        Migraine headache    Followed by neurology.  Currently on inderal and topamax.  Overall stable.  Follow.         Other Visit Diagnoses    Encounter for immunization       Relevant Orders   Flu vaccine HIGH DOSE PF (Completed)       Einar Pheasant, MD

## 2017-05-19 ENCOUNTER — Encounter: Payer: Self-pay | Admitting: Internal Medicine

## 2017-05-19 NOTE — Assessment & Plan Note (Signed)
Last white blood cell count wnl.

## 2017-05-19 NOTE — Assessment & Plan Note (Signed)
Followed by neurology.  Currently on inderal and topamax.  Overall stable.  Follow.

## 2017-05-19 NOTE — Assessment & Plan Note (Signed)
No recent panic attacks.  Discussed with her today.  Takes xanax.  Follow.

## 2017-05-20 ENCOUNTER — Ambulatory Visit
Admission: RE | Admit: 2017-05-20 | Discharge: 2017-05-20 | Disposition: A | Discharge status: Home / Self Care | Payer: Medicare Other | Source: Ambulatory Visit | Attending: Internal Medicine | Admitting: Internal Medicine

## 2017-05-20 DIAGNOSIS — Z1231 Encounter for screening mammogram for malignant neoplasm of breast: Secondary | ICD-10-CM | POA: Insufficient documentation

## 2017-05-22 ENCOUNTER — Other Ambulatory Visit: Payer: Self-pay | Admitting: Internal Medicine

## 2017-06-25 ENCOUNTER — Other Ambulatory Visit: Payer: Self-pay | Admitting: Internal Medicine

## 2017-07-19 ENCOUNTER — Other Ambulatory Visit: Payer: Self-pay | Admitting: Internal Medicine

## 2017-07-26 ENCOUNTER — Other Ambulatory Visit: Payer: Self-pay | Admitting: Internal Medicine

## 2017-07-26 NOTE — Telephone Encounter (Signed)
Last oV 9/7/418 last refill same with 30 and 1 refill .

## 2017-07-26 NOTE — Telephone Encounter (Signed)
Refill faxed

## 2017-07-30 DIAGNOSIS — G4701 Insomnia due to medical condition: Secondary | ICD-10-CM | POA: Diagnosis not present

## 2017-07-30 DIAGNOSIS — G4723 Circadian rhythm sleep disorder, irregular sleep wake type: Secondary | ICD-10-CM | POA: Diagnosis not present

## 2017-07-30 DIAGNOSIS — G43719 Chronic migraine without aura, intractable, without status migrainosus: Secondary | ICD-10-CM | POA: Diagnosis not present

## 2017-08-13 ENCOUNTER — Other Ambulatory Visit: Payer: Self-pay | Admitting: Internal Medicine

## 2017-08-15 ENCOUNTER — Telehealth: Payer: Self-pay | Admitting: Internal Medicine

## 2017-08-15 NOTE — Telephone Encounter (Signed)
Notified patient that script was sent to pharmacy on 08/13/17

## 2017-08-15 NOTE — Telephone Encounter (Signed)
Copied from [place] [id number]. Topic: Quick Communication - See Telephone Encounter >> Aug 15, 2017  8:54 AM Cleaster Corin, NT wrote: CRM for notification. See Telephone encounter for:   08/15/17. Pt. Calling to see if fax was received to get 90 day supply refill on med propranolol 40 mg tablet pt. Said she is running low on med. Talked to pharmacy twice and they said it has been faxed  [place], [place] [address] Phone: [phone] Fax: [phone] Not a 24 hour pharmacy; exact hours not known

## 2017-08-22 ENCOUNTER — Encounter: Payer: Medicare Other | Admitting: Internal Medicine

## 2017-08-26 ENCOUNTER — Other Ambulatory Visit: Payer: Self-pay | Admitting: Internal Medicine

## 2017-08-26 ENCOUNTER — Telehealth: Payer: Self-pay | Admitting: Internal Medicine

## 2017-08-26 NOTE — Telephone Encounter (Signed)
ok'd rx for xanax #30 with 1 refill.  rx signed and placed in box.

## 2017-08-26 NOTE — Telephone Encounter (Unsigned)
Copied from [place] [id number]. Topic: Quick Communication - Rx Refill/Question >> Aug 26, 2017  4:00 PM Neva Seat wrote: Has the patient contacted their pharmacy? Yes - no refills - phamacy sent a request to Dr. Nicki Reaper for refills  [facility] in [place] - [phone]  Alprazolam

## 2017-08-26 NOTE — Telephone Encounter (Signed)
Last OV 05/17/2017 Next OV 11/28/2017 Last refill 07/26/2017

## 2017-08-27 NOTE — Telephone Encounter (Signed)
Rx sent to pharmacy 12/17

## 2017-09-01 ENCOUNTER — Other Ambulatory Visit: Payer: Self-pay | Admitting: Internal Medicine

## 2017-09-26 ENCOUNTER — Other Ambulatory Visit: Payer: Self-pay | Admitting: *Deleted

## 2017-09-26 ENCOUNTER — Telehealth: Payer: Self-pay | Admitting: Internal Medicine

## 2017-09-26 MED ORDER — RABEPRAZOLE SODIUM 20 MG PO TBEC: 20.0000 mg | DELAYED_RELEASE_TABLET | Freq: Every day | ORAL | 0 refills | Status: DC

## 2017-09-26 MED ORDER — DULOXETINE HCL 60 MG PO CPEP: 60.0000 mg | ORAL_CAPSULE | Freq: Every day | ORAL | 0 refills | Status: DC

## 2017-09-26 NOTE — Telephone Encounter (Signed)
Copied from [place] [id number]. Topic: Quick Communication - Rx Refill/Question >> Sep 26, 2017 12:54 PM Cecelia Byars, NT wrote: Medication: Dulaxetine 60 mg and also rabetrazole sod 20  Has the patient contacted their pharmacy? {yes  (Agent: If no, request that the patient contact the pharmacy for the refill.) Preferred Pharmacy (with phone number [facility] street [facility]  fax 1 [phone] Agent: Please be advised that RX refills may take up to 3 business days. We ask that you follow-up with your pharmacy. [facility].

## 2017-09-26 NOTE — Telephone Encounter (Signed)
[facility] per [facility]

## 2017-10-24 ENCOUNTER — Other Ambulatory Visit: Payer: Self-pay | Admitting: Internal Medicine

## 2017-10-25 NOTE — Telephone Encounter (Signed)
Kathy faxed to pharmacy 

## 2017-10-25 NOTE — Telephone Encounter (Signed)
Last OV 05/17/2017 Next OV 11/28/2017 Last refill 08/26/2017

## 2017-10-29 ENCOUNTER — Telehealth: Payer: Self-pay | Admitting: Internal Medicine

## 2017-10-29 MED ORDER — PROPRANOLOL HCL 40 MG PO TABS: 40.0000 mg | ORAL_TABLET | Freq: Every day | ORAL | 4 refills | Status: DC

## 2017-10-29 NOTE — Telephone Encounter (Signed)
Copied from [place]. Topic: Quick Communication - Rx Refill/Question >> Oct 29, 2017  9:58 AM Robina Ade, Helene Kelp D wrote: Medication: propranolol (INDERAL) 40 MG tablet with a 90 day supply   Has the patient contacted their pharmacy? Yes   (Agent: If no, request that the patient contact the pharmacy for the refill.)   Preferred Pharmacy (with phone number or street name): [facility] [place], [place] [facility]   Agent: Please be advised that RX refills may take up to 3 business days. We ask that you follow-up with your pharmacy.

## 2017-11-14 ENCOUNTER — Ambulatory Visit: Payer: Medicare Other

## 2017-11-21 DIAGNOSIS — R52 Pain, unspecified: Secondary | ICD-10-CM | POA: Diagnosis not present

## 2017-11-21 DIAGNOSIS — G43719 Chronic migraine without aura, intractable, without status migrainosus: Secondary | ICD-10-CM | POA: Diagnosis not present

## 2017-11-21 DIAGNOSIS — G4701 Insomnia due to medical condition: Secondary | ICD-10-CM | POA: Diagnosis not present

## 2017-11-25 ENCOUNTER — Other Ambulatory Visit: Payer: Self-pay | Admitting: Internal Medicine

## 2017-11-25 NOTE — Telephone Encounter (Signed)
Okay to refill? Last refilled on: 10/25/17 for #30 with no refills.  LOV: 05/17/17 NOV: 11/28/17

## 2017-11-26 NOTE — Telephone Encounter (Signed)
Rx faxed to [facility]

## 2017-11-28 ENCOUNTER — Ambulatory Visit (INDEPENDENT_AMBULATORY_CARE_PROVIDER_SITE_OTHER): Payer: Medicare Other | Admitting: Internal Medicine

## 2017-11-28 VITALS — BP 128/76 | HR 77 | Temp 98.3°F | Resp 16 | Ht 64.0 in | Wt 114.4 lb

## 2017-11-28 DIAGNOSIS — G43709 Chronic migraine without aura, not intractable, without status migrainosus: Secondary | ICD-10-CM | POA: Diagnosis not present

## 2017-11-28 DIAGNOSIS — K2960 Other gastritis without bleeding: Secondary | ICD-10-CM | POA: Diagnosis not present

## 2017-11-28 DIAGNOSIS — Z23 Encounter for immunization: Secondary | ICD-10-CM

## 2017-11-28 DIAGNOSIS — Z9109 Other allergy status, other than to drugs and biological substances: Secondary | ICD-10-CM

## 2017-11-28 DIAGNOSIS — Z0000 Encounter for general adult medical examination without abnormal findings: Secondary | ICD-10-CM

## 2017-11-28 DIAGNOSIS — J329 Chronic sinusitis, unspecified: Secondary | ICD-10-CM

## 2017-11-28 DIAGNOSIS — D72819 Decreased white blood cell count, unspecified: Secondary | ICD-10-CM

## 2017-11-28 DIAGNOSIS — F419 Anxiety disorder, unspecified: Secondary | ICD-10-CM

## 2017-11-28 DIAGNOSIS — R300 Dysuria: Secondary | ICD-10-CM

## 2017-11-28 MED ORDER — SUCRALFATE 1 G PO TABS: 1.0000 g | ORAL_TABLET | Freq: Two times a day (BID) | ORAL | 1 refills | Status: DC | PRN

## 2017-11-28 MED ORDER — ALPRAZOLAM 0.25 MG PO TABS: ORAL_TABLET | ORAL | 0 refills | Status: DC

## 2017-11-28 MED ORDER — CEFUROXIME AXETIL 250 MG PO TABS: 250.0000 mg | ORAL_TABLET | Freq: Two times a day (BID) | ORAL | 0 refills | Status: DC

## 2017-11-28 NOTE — Assessment & Plan Note (Signed)
Physical today 11/28/17.  Mammogram 05/21/17 - Birads I.  Colonoscopy 10/13/13.  Recommended f/u colonoscopy in 5 years.

## 2017-11-28 NOTE — Patient Instructions (Addendum)
Saline nasal spray - flush nose at least 2-3x/day  flonase nasal spray - 2 sprays each nostril one time per day.  Do this in the evening.    Continue mucinex   Add back carafate.    If you take the antibiotic, take a probiotic daily while you are on the antibiotic and for two weeks after completing the antibiotic.

## 2017-11-28 NOTE — Progress Notes (Signed)
Patient ID: Brandi Ramos, female   DOB: 06-07-1946, 72 y.o.   MRN: [id number]   Subjective:    Patient ID: Brandi Ramos, female    DOB: 1945-10-17, 72 y.o.   MRN: [id number]  HPI  Patient with past history of migraine headaches followed by neurology and GERD.  She comes in today to follow up on these issues as well as for a complete physical exam.  She reports she has been feeling a little more down recently.  Some stress with her granddaughter.  Discussed counseling and discussed referral to psychiatry.  She declines.  Does not feel she needs anything more at this time.  She is having some acid reflux.  Noticed some pain - epigastric region.  No swallowing problems.  Bowels stable.  Has noticed some dysuria.  Also, has noticed some increased congestion and pressure.  Symptoms persistent.     Past Medical History:  Diagnosis Date  . Anxiety   . Arthritis   . Cancer ([place])    skin  . Chicken pox   . Diverticulosis   . Fibrocystic breast disease   . GERD (gastroesophageal reflux disease)   . Migraine headache   . Neutropenia ([place])    worked up - Dr Oliva Bustard, slightly elevated ANA  . PUD (peptic ulcer disease)    Past Surgical History:  Procedure Laterality Date  . ABDOMINAL HYSTERECTOMY  1984  . ESOPHAGOGASTRODUODENOSCOPY (EGD) WITH PROPOFOL N/A 10/31/2015   Procedure: ESOPHAGOGASTRODUODENOSCOPY (EGD) WITH PROPOFOL;  Surgeon: Lollie Sails, MD;  Location: [facility] ENDOSCOPY;  Service: Endoscopy;  Laterality: N/A;   Family History  Problem Relation Age of Onset  . CAD Mother        s/p CABG  . Hypercholesterolemia Mother   . Breast cancer Mother 28  . Hypertension Father   . Migraines Father   . Hypercholesterolemia Father   . Migraines Sister   . Lymphoma Unknown        grandmother  . Colon cancer Neg Hx    Social History   Socioeconomic History  . Marital status: Widowed    Spouse name: Not on file  . Number of children: 2  . Years of education: Not on file  .  Highest education level: Not on file  Occupational History  . Not on file  Social Needs  . Financial resource strain: Not on file  . Food insecurity:    Worry: Not on file    Inability: Not on file  . Transportation needs:    Medical: Not on file    Non-medical: Not on file  Tobacco Use  . Smoking status: Never Smoker  . Smokeless tobacco: Never Used  Substance and Sexual Activity  . Alcohol use: No    Alcohol/week: 0.0 oz    Comment: wine occassionally  . Drug use: No  . Sexual activity: Not Currently  Lifestyle  . Physical activity:    Days per week: Not on file    Minutes per session: Not on file  . Stress: Not on file  Relationships  . Social connections:    Talks on phone: Not on file    Gets together: Not on file    Attends religious service: Not on file    Active member of club or organization: Not on file    Attends meetings of clubs or organizations: Not on file    Relationship status: Not on file  Other Topics Concern  . Not on file  Social History Narrative  .  Not on file    Outpatient Encounter Medications as of 11/28/2017  Medication Sig  . AIMOVIG 140 DOSE 70 MG/ML SOAJ INJ 1 SYRINGE UTD Q 4 WKS  . ALPRAZolam (XANAX) 0.25 MG tablet Take one tablet daily prn  . cefUROXime (CEFTIN) 250 MG tablet Take 1 tablet (250 mg total) by mouth 2 (two) times daily with a meal.  . DULoxetine (CYMBALTA) 60 MG capsule Take 1 capsule (60 mg total) by mouth daily.  . fluticasone (FLONASE) 50 MCG/ACT nasal spray SHAKE LIQUID AND USE 2 SPRAYS IN EACH NOSTRIL DAILY  . fluticasone (FLONASE) 50 MCG/ACT nasal spray SHAKE LIQUID AND USE 2 SPRAYS IN EACH NOSTRIL DAILY  . LINZESS 145 MCG CAPS capsule Take 145 mcg by mouth daily.  Marland Kitchen loratadine (CLARITIN) 10 MG tablet Take 10 mg by mouth daily.  . propranolol (INDERAL) 40 MG tablet Take 1 tablet (40 mg total) by mouth daily.  . RABEprazole (ACIPHEX) 20 MG tablet Take 1 tablet (20 mg total) by mouth daily.  . rizatriptan (MAXALT) 10  MG tablet Take 10 mg by mouth as needed. May repeat in 2 hours if needed  . sucralfate (CARAFATE) 1 g tablet Take 1 tablet (1 g total) by mouth 2 (two) times daily as needed.  . topiramate (TOPAMAX) 25 MG tablet TK 1 TO 2 TS PO HS  . [DISCONTINUED] ALPRAZolam (XANAX) 0.25 MG tablet TAKE 1 TABLET BY MOUTH EVERY DAY AS NEEDED FOR ANXIETY  . [DISCONTINUED] sucralfate (CARAFATE) 1 G tablet Take 1 tablet by mouth 2 (two) times daily as needed.  . [DISCONTINUED] topiramate (TOPAMAX) 50 MG tablet Take 150 mg by mouth at bedtime.   No facility-administered encounter medications on file as of 11/28/2017.     Review of Systems  Constitutional: Negative for appetite change and unexpected weight change.  HENT: Negative for congestion and sinus pressure.   Eyes: Negative for pain and visual disturbance.  Respiratory: Negative for cough, chest tightness and shortness of breath.   Cardiovascular: Negative for chest pain, palpitations and leg swelling.  Gastrointestinal: Negative for diarrhea, nausea and vomiting.       Epigastric discomfort and acid reflux as outlined.    Genitourinary: Negative for difficulty urinating and dysuria.  Musculoskeletal: Negative for joint swelling and myalgias.  Skin: Negative for color change and rash.  Neurological: Negative for dizziness, light-headedness and headaches.  Hematological: Negative for adenopathy. Does not bruise/bleed easily.  Psychiatric/Behavioral: Negative for agitation and dysphoric mood.       Objective:    Physical Exam  Constitutional: She is oriented to person, place, and time. She appears well-developed and well-nourished. No distress.  HENT:  Nose: Nose normal.  Mouth/Throat: Oropharynx is clear and moist.  Eyes: Right eye exhibits no discharge. Left eye exhibits no discharge. No scleral icterus.  Neck: Neck supple. No thyromegaly present.  Cardiovascular: Normal rate and regular rhythm.  Pulmonary/Chest: Breath sounds normal. No  accessory muscle usage. No tachypnea. No respiratory distress. She has no decreased breath sounds. She has no wheezes. She has no rhonchi. Right breast exhibits no inverted nipple, no mass, no nipple discharge and no tenderness (no axillary adenopathy). Left breast exhibits no inverted nipple, no mass, no nipple discharge and no tenderness (no axilarry adenopathy).  Abdominal: Soft. Bowel sounds are normal. There is no tenderness.  Musculoskeletal: She exhibits no edema or tenderness.  Lymphadenopathy:    She has no cervical adenopathy.  Neurological: She is alert and oriented to person, place, and time.  Skin:  Skin is warm. No rash noted. No erythema.  Psychiatric: She has a normal mood and affect. Her behavior is normal.    BP 128/76 (BP Location: Left Arm, Patient Position: Sitting, Cuff Size: Normal)   Pulse 77   Temp 98.3 F (36.8 C) (Oral)   Resp 16   Ht 5\' 4"  (1.626 m)   Wt 114 lb 6.4 oz (51.9 kg)   SpO2 98%   BMI 19.64 kg/m  Wt Readings from Last 3 Encounters:  11/28/17 114 lb 6.4 oz (51.9 kg)  05/17/17 117 lb (53.1 kg)  12/13/16 118 lb 6.4 oz (53.7 kg)     Lab Results  Component Value Date   WBC 7.1 06/14/2016   HGB 12.2 07/09/2016   HCT 35.0 (L) 06/14/2016   PLT 153.0 06/14/2016   GLUCOSE 100 (H) 10/14/2015   CHOL 187 10/14/2015   TRIG 78.0 10/14/2015   HDL 61.70 10/14/2015   LDLCALC 110 (H) 10/14/2015   ALT 11 10/14/2015   AST 22 10/14/2015   NA 142 10/14/2015   K 3.6 10/14/2015   CL 107 10/14/2015   CREATININE 0.81 10/19/2016   BUN 15 10/19/2016   CO2 29 10/14/2015   TSH 2.22 10/14/2015    Mm Screening Breast Tomo Bilateral  Result Date: 05/21/2017 CLINICAL DATA:  Screening. EXAM: 2D DIGITAL SCREENING BILATERAL MAMMOGRAM WITH CAD AND ADJUNCT TOMO COMPARISON:  Previous exam(s). ACR Breast Density Category c: The breast tissue is heterogeneously dense, which may obscure small masses. FINDINGS: There are no findings suspicious for malignancy. Images were  processed with CAD. IMPRESSION: No mammographic evidence of malignancy. A result letter of this screening mammogram will be mailed directly to the patient. RECOMMENDATION: Screening mammogram in one year. (Code:SM-B-01Y) BI-RADS CATEGORY  1: Negative. Electronically Signed   By: Fidela Salisbury M.D.   On: 05/21/2017 11:03       Assessment & Plan:   Problem List Items Addressed This Visit    Anxiety    Discussed with her today.  Desires no further intervention.  Follow.        Relevant Medications   ALPRAZolam (XANAX) 0.25 MG tablet   Environmental allergies    Stable.        Gastritis, bile acid reflux    Off carafate.  On aciphex.  Some acid reflux and epigastric discomfort as outlined.  Has seen GI previously.  Restart carafate.  Continue aciphex.  Follow.        Health care maintenance    Physical today 11/28/17.  Mammogram 05/21/17 - Birads I.  Colonoscopy 10/13/13.  Recommended f/u colonoscopy in 5 years.        Leukopenia    Follow cbc.       Migraine headache    Followed by neurology.  Stable.        Relevant Medications   AIMOVIG 140 DOSE 70 MG/ML SOAJ   topiramate (TOPAMAX) 25 MG tablet    Other Visit Diagnoses    Dysuria    -  Primary   check urinalysis and culture to confirm no infection.     Relevant Orders   Urinalysis, Routine w reflex microscopic (Completed)   Urine Culture   Need for shingles vaccine       discussed with her today.  plans to get at pharmacy.     Sinusitis, unspecified chronicity, unspecified location       persistent symptoms.  saline nasal spray and nasacort nasal spray as outlined.  mucinex.  rx for abx  given if symptoms progress.     Relevant Medications   cefUROXime (CEFTIN) 250 MG tablet       Einar Pheasant, MD

## 2017-11-29 LAB — URINALYSIS, ROUTINE W REFLEX MICROSCOPIC
LEUKOCYTES UA: NEGATIVE
Nitrite: NEGATIVE
PH: 5.5 (ref 5.0–8.0)
SPECIFIC GRAVITY, URINE: 1.025 (ref 1.000–1.030)
UROBILINOGEN UA: 0.2 (ref 0.0–1.0)
Urine Glucose: NEGATIVE

## 2017-12-01 ENCOUNTER — Encounter: Payer: Self-pay | Admitting: Internal Medicine

## 2017-12-01 NOTE — Assessment & Plan Note (Signed)
Discussed with her today. Desires no further intervention.  Follow.   

## 2017-12-01 NOTE — Assessment & Plan Note (Signed)
Stable

## 2017-12-01 NOTE — Assessment & Plan Note (Signed)
Off carafate.  On aciphex.  Some acid reflux and epigastric discomfort as outlined.  Has seen GI previously.  Restart carafate.  Continue aciphex.  Follow.

## 2017-12-01 NOTE — Assessment & Plan Note (Signed)
Followed by neurology.  Stable  

## 2017-12-01 NOTE — Assessment & Plan Note (Signed)
Follow [facility]bc.  

## 2017-12-02 ENCOUNTER — Other Ambulatory Visit: Payer: Self-pay | Admitting: Internal Medicine

## 2017-12-02 ENCOUNTER — Telehealth: Payer: Self-pay | Admitting: Radiology

## 2017-12-02 DIAGNOSIS — Z13220 Encounter for screening for lipoid disorders: Secondary | ICD-10-CM

## 2017-12-02 DIAGNOSIS — D649 Anemia, unspecified: Secondary | ICD-10-CM

## 2017-12-02 DIAGNOSIS — E538 Deficiency of other specified B group vitamins: Secondary | ICD-10-CM

## 2017-12-02 NOTE — Progress Notes (Signed)
Orders placed for labs

## 2017-12-02 NOTE — Telephone Encounter (Signed)
Pt is coming in for labs tomorrow, please place future orders. Thank you  

## 2017-12-02 NOTE — Telephone Encounter (Signed)
Orders placed for labs

## 2017-12-03 ENCOUNTER — Other Ambulatory Visit (INDEPENDENT_AMBULATORY_CARE_PROVIDER_SITE_OTHER): Payer: Medicare Other

## 2017-12-03 DIAGNOSIS — D649 Anemia, unspecified: Secondary | ICD-10-CM | POA: Diagnosis not present

## 2017-12-03 DIAGNOSIS — E538 Deficiency of other specified B group vitamins: Secondary | ICD-10-CM

## 2017-12-03 DIAGNOSIS — Z13220 Encounter for screening for lipoid disorders: Secondary | ICD-10-CM

## 2017-12-03 LAB — BASIC METABOLIC PANEL
BUN: 15 mg/dL (ref 6–23)
CALCIUM: 9.3 mg/dL (ref 8.4–10.5)
CHLORIDE: 107 meq/L (ref 96–112)
CO2: 30 mEq/L (ref 19–32)
CREATININE: 0.79 mg/dL (ref 0.40–1.20)
GFR: 76.6 mL/min (ref >60.00)
Glucose, Bld: 118 mg/dL — ABNORMAL HIGH (ref 70–99)
POTASSIUM: 4 meq/L (ref 3.5–5.1)
SODIUM: 142 meq/L (ref 135–145)

## 2017-12-03 LAB — CBC WITH DIFFERENTIAL/PLATELET
BASOS ABS: 0 10*3/uL (ref 0.0–0.1)
Basophils Relative: 0.5 % (ref 0.0–3.0)
EOS PCT: 3 % (ref 0.0–5.0)
Eosinophils Absolute: 0.1 10*3/uL (ref 0.0–0.7)
HCT: 36.2 % (ref 36.0–46.0)
Hemoglobin: 12.1 g/dL (ref 12.0–15.0)
LYMPHS ABS: 1.8 10*3/uL (ref 0.7–4.0)
Lymphocytes Relative: 43.2 % (ref 12.0–46.0)
MCHC: 33.5 g/dL (ref 30.0–36.0)
MCV: 87 fl (ref 78.0–100.0)
MONO ABS: 0.5 10*3/uL (ref 0.1–1.0)
Monocytes Relative: 11 % (ref 3.0–12.0)
NEUTROS PCT: 42.3 % — AB (ref 43.0–77.0)
Neutro Abs: 1.8 10*3/uL (ref 1.4–7.7)
Platelets: 177 10*3/uL (ref 150.0–400.0)
RBC: 4.16 Mil/uL (ref 3.87–5.11)
RDW: 13.6 % (ref 11.5–15.5)
WBC: 4.2 10*3/uL (ref 4.0–10.5)

## 2017-12-03 LAB — VITAMIN B12: Vitamin B-12: 307 pg/mL (ref 211–911)

## 2017-12-03 LAB — HEPATIC FUNCTION PANEL
ALT: 11 U/L (ref 0–35)
AST: 21 U/L (ref 0–37)
Albumin: 3.6 g/dL (ref 3.5–5.2)
Alkaline Phosphatase: 50 U/L (ref 39–117)
BILIRUBIN TOTAL: 0.4 mg/dL (ref 0.2–1.2)
Bilirubin, Direct: 0.1 mg/dL (ref 0.0–0.3)
Total Protein: 6.5 g/dL (ref 6.0–8.3)

## 2017-12-03 LAB — LIPID PANEL
CHOLESTEROL: 165 mg/dL (ref 0–200)
HDL: 48.3 mg/dL (ref >39.00)
LDL CALC: 94 mg/dL (ref 0–99)
NONHDL: 116.79
Total CHOL/HDL Ratio: 3
Triglycerides: 112 mg/dL (ref 0.0–149.0)
VLDL: 22.4 mg/dL (ref 0.0–40.0)

## 2017-12-03 LAB — URINE CULTURE: Organism ID, Bacteria: NO GROWTH

## 2017-12-03 LAB — FERRITIN: Ferritin: 22.4 ng/mL (ref 10.0–291.0)

## 2017-12-03 LAB — TSH: TSH: 3.5 u[IU]/mL (ref 0.35–4.50)

## 2017-12-04 ENCOUNTER — Other Ambulatory Visit: Payer: Self-pay | Admitting: Internal Medicine

## 2017-12-04 DIAGNOSIS — R739 Hyperglycemia, unspecified: Secondary | ICD-10-CM

## 2017-12-04 NOTE — Progress Notes (Signed)
Order placed for f/u fasting labs.

## 2017-12-09 ENCOUNTER — Other Ambulatory Visit (INDEPENDENT_AMBULATORY_CARE_PROVIDER_SITE_OTHER): Payer: Medicare Other

## 2017-12-09 DIAGNOSIS — R319 Hematuria, unspecified: Secondary | ICD-10-CM

## 2017-12-09 DIAGNOSIS — R739 Hyperglycemia, unspecified: Secondary | ICD-10-CM

## 2017-12-09 LAB — URINALYSIS, ROUTINE W REFLEX MICROSCOPIC
BILIRUBIN URINE: NEGATIVE
HGB URINE DIPSTICK: NEGATIVE
KETONES UR: NEGATIVE
LEUKOCYTES UA: NEGATIVE
NITRITE: NEGATIVE
RBC / HPF: NONE SEEN (ref 0–?)
Specific Gravity, Urine: 1.015 (ref 1.000–1.030)
TOTAL PROTEIN, URINE-UPE24: NEGATIVE
URINE GLUCOSE: NEGATIVE
UROBILINOGEN UA: 0.2 (ref 0.0–1.0)
pH: 6.5 (ref 5.0–8.0)

## 2017-12-09 NOTE — Addendum Note (Signed)
Addended by: Arby Barrette on: 12/09/2017 11:09 AM   Modules accepted: Orders

## 2017-12-09 NOTE — Addendum Note (Signed)
Addended by: Leeanne Rio on: 12/09/2017 11:18 AM   Modules accepted: Orders

## 2017-12-09 NOTE — Addendum Note (Signed)
Addended by: Arby Barrette on: 12/09/2017 11:10 AM   Modules accepted: Orders

## 2017-12-16 ENCOUNTER — Other Ambulatory Visit: Payer: Self-pay | Admitting: Internal Medicine

## 2017-12-16 DIAGNOSIS — R319 Hematuria, unspecified: Secondary | ICD-10-CM

## 2017-12-16 NOTE — Progress Notes (Signed)
Order placed for urology referral.  

## 2018-01-07 ENCOUNTER — Encounter: Payer: Self-pay | Admitting: Urology

## 2018-01-07 ENCOUNTER — Ambulatory Visit (INDEPENDENT_AMBULATORY_CARE_PROVIDER_SITE_OTHER): Payer: Medicare Other | Admitting: Urology

## 2018-01-07 VITALS — BP 130/69 | HR 66 | Resp 16 | Ht 64.0 in | Wt 116.0 lb

## 2018-01-07 DIAGNOSIS — N200 Calculus of kidney: Secondary | ICD-10-CM

## 2018-01-07 DIAGNOSIS — R3129 Other microscopic hematuria: Secondary | ICD-10-CM | POA: Diagnosis not present

## 2018-01-07 DIAGNOSIS — R350 Frequency of micturition: Secondary | ICD-10-CM

## 2018-01-07 LAB — URINALYSIS, COMPLETE
BILIRUBIN UA: NEGATIVE
Glucose, UA: NEGATIVE
Ketones, UA: NEGATIVE
Leukocytes, UA: NEGATIVE
NITRITE UA: NEGATIVE
PH UA: 6 (ref 5.0–7.5)
Protein, UA: NEGATIVE
RBC UA: NEGATIVE
Specific Gravity, UA: 1.015 (ref 1.005–1.030)
UUROB: 0.2 mg/dL (ref 0.2–1.0)

## 2018-01-07 LAB — MICROSCOPIC EXAMINATION

## 2018-01-07 NOTE — Progress Notes (Signed)
01/07/2018 10:18 AM   Brandi Ramos February 18, 1946 [id number]  Referring provider: Einar Pheasant, [address] [place], [place] [address]  Chief Complaint  Patient presents with  . Cysto    HPI: 72 year old female who presents for further evaluation of hematuria.  She presented to her PCP concerned for about possible UTI with symptoms increased frequency and possible gross hematuria.  UA at the time shows TNTC RBC, + CaOxalate chystals in the urine in the absence of infection.  UCx negative.  She is somewhat of a poor historian but does recall possible associated mid epigastric pain around the time.  This is felt to be related to gastritis, she was resumed on Carafate and her symptoms resolved.  She denied any overt flank pain around the time.  Repeat UA on 12/09/2017 negative for any microscopic blood.  UA today is also negative.  Never smoker.    She does have a personal history of kidney stones, non obstructing followed by Dr. Bernardo Heater many years ago.    Her most recent cross-sectional imaging was 11/2015 which does show a nonobstructing 7 mm right upper pole stone.  Kidneys were normal bilaterally.  She does have baseline intermittent urinary frequency.  She reports that she can have a few days where she has urgency and frequency alternating with completely normal and asymptomatic days.  She does drink coffee in the mornings with variable amounts.   PMH: Past Medical History:  Diagnosis Date  . Anxiety   . Arthritis   . Cancer ([place])    skin  . Chicken pox   . Diverticulosis   . Fibrocystic breast disease   . GERD (gastroesophageal reflux disease)   . Migraine headache   . Neutropenia ([place])    worked up - Dr Oliva Bustard, slightly elevated ANA  . PUD (peptic ulcer disease)     Surgical History: Past Surgical History:  Procedure Laterality Date  . ABDOMINAL HYSTERECTOMY  1984  . ESOPHAGOGASTRODUODENOSCOPY (EGD) WITH PROPOFOL N/A 10/31/2015   Procedure: ESOPHAGOGASTRODUODENOSCOPY (EGD) WITH PROPOFOL;  Surgeon: Lollie Sails, MD;  Location[facility] ENDOSCOPY;  Service: Endoscopy;  Laterality: N/A;    Home Medications:  Allergies as of 01/07/2018      Reactions   Amitiza [lubiprostone] Other (See Comments)   Sulfa Antibiotics       Medication List        Accurate as of 01/07/18 10:18 AM. Always use your most recent med list.          AIMOVIG 140 DOSE 70 MG/ML Soaj Generic drug:  Erenumab-aooe INJ 1 SYRINGE UTD Q 4 WKS   ALPRAZolam 0.25 MG tablet Commonly known as:  XANAX Take one tablet daily prn   cefUROXime 250 MG tablet Commonly known as:  CEFTIN Take 1 tablet (250 mg total) by mouth 2 (two) times daily with a meal.   DULoxetine 60 MG capsule Commonly known as:  CYMBALTA Take 1 capsule (60 mg total) by mouth daily.   fluticasone 50 MCG/ACT nasal spray Commonly known as:  FLONASE SHAKE LIQUID AND USE 2 SPRAYS IN EACH NOSTRIL DAILY   fluticasone 50 MCG/ACT nasal spray Commonly known as:  FLONASE SHAKE LIQUID AND USE 2 SPRAYS IN EACH NOSTRIL DAILY   LINZESS 145 MCG Caps capsule Generic drug:  linaclotide Take 145 mcg by mouth daily.   loratadine 10 MG tablet Commonly known as:  CLARITIN Take 10 mg by mouth daily.   propranolol 40 MG tablet Commonly known as:  INDERAL Take  1 tablet (40 mg total) by mouth daily.   RABEprazole 20 MG tablet Commonly known as:  ACIPHEX Take 1 tablet (20 mg total) by mouth daily.   rizatriptan 10 MG tablet Commonly known as:  MAXALT Take 10 mg by mouth as needed. May repeat in 2 hours if needed   sucralfate 1 g tablet Commonly known as:  CARAFATE Take 1 tablet (1 g total) by mouth 2 (two) times daily as needed.   topiramate 25 MG tablet Commonly known as:  TOPAMAX TK 1 TO 2 TS PO HS       Allergies:  Allergies  Allergen Reactions  . Amitiza [Lubiprostone] Other (See Comments)  . Sulfa Antibiotics     Family History: Family History  Problem Relation  Age of Onset  . CAD Mother        s/p CABG  . Hypercholesterolemia Mother   . Breast cancer Mother 88  . Hypertension Father   . Migraines Father   . Hypercholesterolemia Father   . Migraines Sister   . Lymphoma Unknown        grandmother  . Colon cancer Neg Hx     Social History:  reports that she has never smoked. She has never used smokeless tobacco. She reports that she does not drink alcohol or use drugs.  ROS: UROLOGY Frequent Urination?: Yes Hard to postpone urination?: No Burning/pain with urination?: No Get up at night to urinate?: No Leakage of urine?: No Urine stream starts and stops?: No Trouble starting stream?: No Do you have to strain to urinate?: No Blood in urine?: Yes Urinary tract infection?: No Sexually transmitted disease?: No Injury to kidneys or bladder?: No Painful intercourse?: No Weak stream?: Yes Currently pregnant?: No Vaginal bleeding?: No  Gastrointestinal Nausea?: No Vomiting?: No Indigestion/heartburn?: No Diarrhea?: No Constipation?: No  Constitutional Fever: No Night sweats?: No Weight loss?: No Fatigue?: No  Skin Skin rash/lesions?: No Itching?: No  Eyes Blurred vision?: No Double vision?: No  Ears/Nose/Throat Sore throat?: No Sinus problems?: Yes  Hematologic/Lymphatic Swollen glands?: No Easy bruising?: No  Cardiovascular Leg swelling?: No Chest pain?: No  Respiratory Cough?: No Shortness of breath?: No  Endocrine Excessive thirst?: No  Musculoskeletal Back pain?: No Joint pain?: Yes  Neurological Headaches?: Yes Dizziness?: Yes  Psychologic Depression?: Yes Anxiety?: Yes  Physical Exam: BP 130/69   Pulse 66   Resp 16   Ht 5\' 4"  (1.626 m)   Wt 116 lb (52.6 kg)   SpO2 98%   BMI 19.91 kg/m   Constitutional:  Alert and oriented, No acute distress. HEENT: [place] AT, moist mucus membranes.  Trachea midline, no masses. Cardiovascular: No clubbing, cyanosis, or edema. Respiratory: Normal  respiratory effort, no increased work of breathing. GI: Abdomen is soft, nontender, nondistended, no abdominal masses GU: No CVA tenderness. Skin: No rashes, bruises or suspicious lesions. Neurologic: Grossly intact, no focal deficits, moving all 4 extremities. Psychiatric: Normal mood and affect.  Laboratory Data: Lab Results  Component Value Date   WBC 4.2 12/03/2017   HGB 12.1 12/03/2017   HCT 36.2 12/03/2017   MCV 87.0 12/03/2017   PLT 177.0 12/03/2017    Lab Results  Component Value Date   CREATININE 0.79 12/03/2017   Urinalysis Lab Results  Component Value Date   LABMICR See below: 01/07/2018   WBCUA 0-5 01/07/2018   RBCUA 0-2 01/07/2018   LABEPIT 0-10 01/07/2018   MUCUS Present (A) 01/07/2018   BACTERIA Few (A) 01/07/2018    Pertinent Imaging: CLINICAL  DATA:  Epigastric pain.  Nausea for 2 months  EXAM: CT ABDOMEN WITH CONTRAST  TECHNIQUE: Multidetector CT imaging of the abdomen was performed using the standard protocol following bolus administration of intravenous contrast.  CONTRAST:  64mL OMNIPAQUE IOHEXOL 300 MG/ML  SOLN  COMPARISON:  10/29/2009  FINDINGS: Lower chest: No pleural or pericardial effusion identified. The lung bases are clear.  Hepatobiliary: No suspicious liver abnormalities identified. The gallbladder appears normal. There is no biliary dilatation.  Pancreas: No mass, inflammatory changes, or other significant abnormality.  Spleen: Within normal limits in size and appearance.  Adrenals/Urinary Tract: Normal adrenal glands. Normal appearance of the kidneys.  Stomach/Bowel: No evidence of obstruction, inflammatory process, or abnormal fluid collections.  Vascular/Lymphatic: No pathologically enlarged lymph nodes. No evidence of abdominal aortic aneurysm.  Other: None.  Musculoskeletal: Chronic appearing superior endplate deformity involves the L5 vertebra.  IMPRESSION: 1. No acute findings identified  within the abdomen or pelvis. 2. 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   Electronically Signed   By: Kerby Moors M.D.   On: 11/16/2015 08:23  CT scan was reviewed personally today.  There is a 7 mm right upper pole stone which is nonobstructing and not reported on the above.  Assessment & Plan:    1. Microscopic hematuria We discussed the differential diagnosis for microscopic hematuria including nephrolithiasis, renal or upper tract tumors, bladder stones, UTIs, or bladder tumors as well as undetermined etiologies. Per AUA guidelines, I did recommend complete microscopic hematuria evaluation including CTU, possible urine cytology, and office cystoscopy. - Urinalysis, Complete - CT HEMATURIA WORKUP; Future  2. Right kidney stone Nonobstructing right upper pole stone, otherwise asymptomatic Will evaluate further at the time of CT hematuria work-up Suspect episode of bleeding may have been related to stone especially in the setting of calcium oxalate crystals in the urine - CT HEMATURIA WORKUP; Future  3. Urinary frequency Suspect behavioral given that her urinary frequency is intermittent  Return in about 1 month (around 02/04/2018) for cysto/ CT urogam.  Hollice Espy, [place] [address] [place], [place] [address] [phone]

## 2018-01-10 ENCOUNTER — Telehealth: Payer: Self-pay | Admitting: Internal Medicine

## 2018-01-10 MED ORDER — DULOXETINE HCL 60 MG PO CPEP: 60.0000 mg | ORAL_CAPSULE | Freq: Every day | ORAL | 0 refills | Status: DC

## 2018-01-10 NOTE — Telephone Encounter (Signed)
Copied from [place] [id number]. Topic: Quick Communication - Rx Refill/Question >> Jan 10, 2018 11:03 AM Synthia Innocent wrote: Medication: DULoxetine (CYMBALTA) 60 MG capsule  Has the patient contacted their pharmacy? Yes.   (Agent: If no, request that the patient contact the pharmacy for the refill.) Preferred Pharmacy (with phone number or street name): [facility] Agent: Please be advised that RX refills may take up to 3 business days. We ask that you follow-up with your pharmacy.

## 2018-01-15 ENCOUNTER — Other Ambulatory Visit (INDEPENDENT_AMBULATORY_CARE_PROVIDER_SITE_OTHER): Payer: Medicare Other

## 2018-01-15 DIAGNOSIS — R739 Hyperglycemia, unspecified: Secondary | ICD-10-CM

## 2018-01-15 NOTE — Addendum Note (Signed)
Addended by: Arby Barrette on: 01/15/2018 08:06 AM   Modules accepted: Orders

## 2018-01-16 ENCOUNTER — Encounter: Payer: Self-pay | Admitting: Internal Medicine

## 2018-01-16 LAB — HEMOGLOBIN A1C
EAG (MMOL/L): 6.2 (calc)
Hgb A1c MFr Bld: 5.5 % of total Hgb (ref <5.7)
MEAN PLASMA GLUCOSE: 111 (calc)

## 2018-01-16 LAB — GLUCOSE, FASTING: Glucose, Plasma: 102 mg/dL — ABNORMAL HIGH (ref 65–99)

## 2018-01-21 ENCOUNTER — Ambulatory Visit
Admission: RE | Admit: 2018-01-21 | Discharge: 2018-01-21 | Disposition: A | Discharge status: Home / Self Care | Payer: Medicare Other | Source: Ambulatory Visit | Attending: Urology | Admitting: Urology

## 2018-01-21 DIAGNOSIS — R350 Frequency of micturition: Secondary | ICD-10-CM | POA: Diagnosis not present

## 2018-01-21 DIAGNOSIS — N200 Calculus of kidney: Secondary | ICD-10-CM | POA: Insufficient documentation

## 2018-01-21 DIAGNOSIS — R3129 Other microscopic hematuria: Secondary | ICD-10-CM | POA: Insufficient documentation

## 2018-01-21 DIAGNOSIS — M4856XA Collapsed vertebra, not elsewhere classified, lumbar region, initial encounter for fracture: Secondary | ICD-10-CM | POA: Diagnosis not present

## 2018-01-21 DIAGNOSIS — I700 Atherosclerosis of aorta: Secondary | ICD-10-CM | POA: Diagnosis not present

## 2018-01-21 HISTORY — DX: Basal cell carcinoma of skin, unspecified: C44.91

## 2018-01-21 LAB — POCT I-STAT CREATININE: CREATININE: 0.8 mg/dL (ref 0.44–1.00)

## 2018-01-21 MED ORDER — IOPAMIDOL (ISOVUE-300) INJECTION 61%
100.0000 mL | Freq: Once | INTRAVENOUS | Status: AC | PRN
  Administered 2018-01-21: 100 mL via INTRAVENOUS

## 2018-01-22 ENCOUNTER — Ambulatory Visit (INDEPENDENT_AMBULATORY_CARE_PROVIDER_SITE_OTHER): Payer: Medicare Other | Admitting: Internal Medicine

## 2018-01-22 ENCOUNTER — Encounter: Payer: Self-pay | Admitting: Internal Medicine

## 2018-01-22 VITALS — BP 130/72 | HR 61 | Temp 98.0°F | Resp 18 | Wt 116.2 lb

## 2018-01-22 DIAGNOSIS — Z9109 Other allergy status, other than to drugs and biological substances: Secondary | ICD-10-CM

## 2018-01-22 DIAGNOSIS — F419 Anxiety disorder, unspecified: Secondary | ICD-10-CM

## 2018-01-22 DIAGNOSIS — R319 Hematuria, unspecified: Secondary | ICD-10-CM | POA: Diagnosis not present

## 2018-01-22 DIAGNOSIS — D72819 Decreased white blood cell count, unspecified: Secondary | ICD-10-CM | POA: Diagnosis not present

## 2018-01-22 DIAGNOSIS — G43709 Chronic migraine without aura, not intractable, without status migrainosus: Secondary | ICD-10-CM

## 2018-01-22 DIAGNOSIS — H9390 Unspecified disorder of ear, unspecified ear: Secondary | ICD-10-CM

## 2018-01-22 MED ORDER — ALPRAZOLAM 0.25 MG PO TABS: ORAL_TABLET | ORAL | 1 refills | Status: DC

## 2018-01-22 MED ORDER — MUPIROCIN 2 % EX OINT: TOPICAL_OINTMENT | CUTANEOUS | 0 refills | Status: DC

## 2018-01-22 NOTE — Progress Notes (Signed)
Patient ID: Brandi Ramos, female   DOB: 1946/02/21, 72 y.o.   MRN: [id number]   Subjective:    Patient ID: Brandi Ramos, female    DOB: Mar 28, 1946, 72 y.o.   MRN: [id number]  HPI  Patient here for a scheduled follow up.  She is currently seeing urology for w/up of hematuria.  Wup in progress.  States just had CT.  Due to go over results with urology.  She denies any abdominal pain.  Bowels are moving.  No urine change currently.  Eating.  No nausea or vomiting.  Stays active.  Increased stress.  Discussed with her today.  Overall she feels she is handling things relatively well.  Does report some hoarseness.  Using claritin, nasal spray and mucinex.  Better.  Persistent lesion right ear.     Past Medical History:  Diagnosis Date  . Anxiety   . Arthritis   . Chicken pox   . Diverticulosis   . Fibrocystic breast disease   . GERD (gastroesophageal reflux disease)   . Migraine headache   . Neutropenia ([place])    worked up - Dr Oliva Bustard, slightly elevated ANA  . PUD (peptic ulcer disease)   . 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    Past Surgical History:  Procedure Laterality Date  . ABDOMINAL HYSTERECTOMY  1984  . ESOPHAGOGASTRODUODENOSCOPY (EGD) WITH PROPOFOL N/A 10/31/2015   Procedure: ESOPHAGOGASTRODUODENOSCOPY (EGD) WITH PROPOFOL;  Surgeon: Lollie Sails, MD;  Location[facility] ENDOSCOPY;  Service: Endoscopy;  Laterality: N/A;   Family History  Problem Relation Age of Onset  . CAD Mother        s/p CABG  . Hypercholesterolemia Mother   . Breast cancer Mother 71  . Hypertension Father   . Migraines Father   . Hypercholesterolemia Father   . Migraines Sister   . Lymphoma Unknown        grandmother  . Colon cancer Neg Hx    Social History   Socioeconomic History  . Marital status: Widowed    Spouse name: Not on file  . Number of children: 2  . Years of education: Not on file  . Highest education level: Not on file  Occupational History    . Not on file  Social Needs  . Financial resource strain: Not on file  . Food insecurity:    Worry: Not on file    Inability: Not on file  . Transportation needs:    Medical: Not on file    Non-medical: Not on file  Tobacco Use  . Smoking status: Never Smoker  . Smokeless tobacco: Never Used  Substance and Sexual Activity  . Alcohol use: No    Alcohol/week: 0.0 oz    Comment: wine occassionally  . Drug use: No  . Sexual activity: Not Currently  Lifestyle  . Physical activity:    Days per week: Not on file    Minutes per session: Not on file  . Stress: Not on file  Relationships  . Social connections:    Talks on phone: Not on file    Gets together: Not on file    Attends religious service: Not on file    Active member of club or organization: Not on file    Attends meetings of clubs or organizations: Not on file    Relationship status: Not on file  Other Topics Concern  . Not on file  Social History Narrative  . Not on  file    Outpatient Encounter Medications as of 01/22/2018  Medication Sig  . AIMOVIG 140 DOSE 70 MG/ML SOAJ INJ 1 SYRINGE UTD Q 4 WKS  . ALPRAZolam (XANAX) 0.25 MG tablet Take one tablet daily prn  . DULoxetine (CYMBALTA) 60 MG capsule Take 1 capsule (60 mg total) by mouth daily.  . fluticasone (FLONASE) 50 MCG/ACT nasal spray SHAKE LIQUID AND USE 2 SPRAYS IN EACH NOSTRIL DAILY  . fluticasone (FLONASE) 50 MCG/ACT nasal spray SHAKE LIQUID AND USE 2 SPRAYS IN EACH NOSTRIL DAILY  . LINZESS 145 MCG CAPS capsule Take 145 mcg by mouth daily.  Marland Kitchen loratadine (CLARITIN) 10 MG tablet Take 10 mg by mouth daily.  . propranolol (INDERAL) 40 MG tablet Take 1 tablet (40 mg total) by mouth daily.  . rizatriptan (MAXALT) 10 MG tablet Take 10 mg by mouth as needed. May repeat in 2 hours if needed  . sucralfate (CARAFATE) 1 g tablet Take 1 tablet (1 g total) by mouth 2 (two) times daily as needed.  . topiramate (TOPAMAX) 25 MG tablet TK 1 TO 2 TS PO HS  . [DISCONTINUED]  ALPRAZolam (XANAX) 0.25 MG tablet Take one tablet daily prn  . [DISCONTINUED] RABEprazole (ACIPHEX) 20 MG tablet Take 1 tablet (20 mg total) by mouth daily.  . mupirocin ointment (BACTROBAN) 2 % Apply to affected area on ear bid x 7-10 days  . [DISCONTINUED] cefUROXime (CEFTIN) 250 MG tablet Take 1 tablet (250 mg total) by mouth 2 (two) times daily with a meal.   No facility-administered encounter medications on file as of 01/22/2018.     Review of Systems  Constitutional: Negative for appetite change and unexpected weight change.  HENT: Positive for congestion. Negative for sinus pressure.   Respiratory: Negative for cough, chest tightness and shortness of breath.   Cardiovascular: Negative for chest pain, palpitations and leg swelling.  Gastrointestinal: Negative for abdominal pain, diarrhea, nausea and vomiting.  Genitourinary: Negative for difficulty urinating and dysuria.  Musculoskeletal: Negative for joint swelling and myalgias.  Skin: Negative for color change and rash.  Neurological: Negative for dizziness, light-headedness and headaches.  Psychiatric/Behavioral: Negative for agitation and dysphoric mood.       Objective:    Physical Exam  Constitutional: She appears well-developed and well-nourished. No distress.  HENT:  Nose: Nose normal.  Mouth/Throat: Oropharynx is clear and moist.  Neck: Neck supple. No thyromegaly present.  Cardiovascular: Normal rate and regular rhythm.  Pulmonary/Chest: Breath sounds normal. No respiratory distress. She has no wheezes.  Abdominal: Soft. Bowel sounds are normal. There is no tenderness.  Musculoskeletal: She exhibits no edema or tenderness.  Lymphadenopathy:    She has no cervical adenopathy.  Skin: No rash noted. No erythema.  Psychiatric: She has a normal mood and affect. Her behavior is normal.    BP 130/72 (BP Location: Left Arm, Patient Position: Sitting, Cuff Size: Normal)   Pulse 61   Temp 98 F (36.7 C) (Oral)   Resp  18   Wt 116 lb 3.2 oz (52.7 kg)   SpO2 98%   BMI 19.95 kg/m  Wt Readings from Last 3 Encounters:  01/22/18 116 lb 3.2 oz (52.7 kg)  01/07/18 116 lb (52.6 kg)  11/28/17 114 lb 6.4 oz (51.9 kg)     Lab Results  Component Value Date   WBC 4.2 12/03/2017   HGB 12.1 12/03/2017   HCT 36.2 12/03/2017   PLT 177.0 12/03/2017   GLUCOSE 118 (H) 12/03/2017   CHOL 165 12/03/2017  TRIG 112.0 12/03/2017   HDL 48.30 12/03/2017   LDLCALC 94 12/03/2017   ALT 11 12/03/2017   AST 21 12/03/2017   NA 142 12/03/2017   K 4.0 12/03/2017   CL 107 12/03/2017   CREATININE 0.80 01/21/2018   BUN 15 12/03/2017   CO2 30 12/03/2017   TSH 3.50 12/03/2017   HGBA1C 5.5 01/15/2018       Assessment & Plan:   Problem List Items Addressed This Visit    Anxiety    Discussed with her today.  Overall feels things are stable.  Follow.        Relevant Medications   ALPRAZolam (XANAX) 0.25 MG tablet   Environmental allergies    With some hoarseness previously.  Some intermittent congestion.  Claritin, flonase and mucinex as directed.  Follow.  Notify me if symptoms persist or if they worsen.        Hematuria    Work up in progress.  Seeing urology.  Plans to discuss CT scan and further w/up with urology.  Will notify me if needs [place] to order further testing.        Leukopenia    Follow cbc.        Migraine headache    Followed by neurology.  Stable.         Other Visit Diagnoses    Ear lesion    -  Primary   Bactroban.         Einar Pheasant, MD

## 2018-01-27 ENCOUNTER — Telehealth: Payer: Self-pay | Admitting: Internal Medicine

## 2018-01-27 NOTE — Telephone Encounter (Signed)
Copied from [place] [id number]. Topic: Quick Communication - Rx Refill/Question >> Jan 27, 2018  4:57 PM Tye Maryland wrote: Medication: RABEprazole (ACIPHEX) 20 MG tablet [[id number]]   Has the patient contacted their pharmacy? Yes.   (Agent: If no, request that the patient contact the pharmacy for the refill.) (Agent: If yes, when and what did the pharmacy advise?)  Preferred Pharmacy (with phone number or street name): [facility]  Agent: Please be advised that RX refills may take up to 3 business days. We ask that you follow-up with your pharmacy.

## 2018-01-28 MED ORDER — RABEPRAZOLE SODIUM 20 MG PO TBEC: 20.0000 mg | DELAYED_RELEASE_TABLET | Freq: Every day | ORAL | 0 refills | Status: DC

## 2018-01-29 ENCOUNTER — Ambulatory Visit: Payer: Medicare Other

## 2018-02-02 ENCOUNTER — Encounter: Payer: Self-pay | Admitting: Internal Medicine

## 2018-02-02 DIAGNOSIS — R319 Hematuria, unspecified: Secondary | ICD-10-CM | POA: Insufficient documentation

## 2018-02-02 NOTE — Assessment & Plan Note (Signed)
With some hoarseness previously.  Some intermittent congestion.  Claritin, flonase and mucinex as directed.  Follow.  Notify me if symptoms persist or if they worsen.

## 2018-02-02 NOTE — Assessment & Plan Note (Signed)
Follow [facility]bc.  

## 2018-02-02 NOTE — Assessment & Plan Note (Signed)
Followed by neurology.  Stable  

## 2018-02-02 NOTE — Assessment & Plan Note (Signed)
Discussed with her today.  Overall feels things are stable.  Follow.

## 2018-02-02 NOTE — Assessment & Plan Note (Signed)
Work up in progress.  Seeing urology.  Plans to discuss CT scan and further w/up with urology.  Will notify me if needs [place] to order further testing.

## 2018-02-03 ENCOUNTER — Other Ambulatory Visit: Payer: Self-pay | Admitting: Internal Medicine

## 2018-02-18 ENCOUNTER — Ambulatory Visit (INDEPENDENT_AMBULATORY_CARE_PROVIDER_SITE_OTHER): Payer: Medicare Other | Admitting: Urology

## 2018-02-18 ENCOUNTER — Encounter: Payer: Self-pay | Admitting: Urology

## 2018-02-18 VITALS — BP 145/68 | HR 36 | Ht 64.0 in | Wt 114.8 lb

## 2018-02-18 DIAGNOSIS — N200 Calculus of kidney: Secondary | ICD-10-CM

## 2018-02-18 DIAGNOSIS — K388 Other specified diseases of appendix: Secondary | ICD-10-CM

## 2018-02-18 DIAGNOSIS — R3129 Other microscopic hematuria: Secondary | ICD-10-CM | POA: Diagnosis not present

## 2018-02-18 LAB — URINALYSIS, COMPLETE
BILIRUBIN UA: NEGATIVE
Glucose, UA: NEGATIVE
Ketones, UA: NEGATIVE
Nitrite, UA: NEGATIVE
PH UA: 7 (ref 5.0–7.5)
PROTEIN UA: NEGATIVE
Specific Gravity, UA: 1.015 (ref 1.005–1.030)
Urobilinogen, Ur: 1 mg/dL (ref 0.2–1.0)

## 2018-02-18 LAB — MICROSCOPIC EXAMINATION: YEAST UA: NONE SEEN

## 2018-02-18 NOTE — Progress Notes (Signed)
   02/18/18  CC:  Chief Complaint  Patient presents with  . Cysto    HPI: 72 year old female with history of nonobstructing nephrolithiasis and microscopic hematuria who presents today for cystoscopy for further evaluation.  In the interim, she had a CT urogram on 01/21/2018 showing right greater than left nonobstructing stones up to 8 mm in the right lower pole which are asymptomatic.     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  Blood pressure (!) 145/68, pulse (!) 36, height 5\' 4"  (1.626 m), weight 114 lb 12.8 oz (52.1 kg). NED. A&Ox3.   No respiratory distress   Abd soft, NT, ND Normal external genitalia with patent urethral meatus  Cystoscopy Procedure Note  Patient identification was confirmed, informed consent was obtained, and patient was prepped using Betadine solution.  Lidocaine jelly was administered per urethral meatus.    Preoperative abx where received prior to procedure.    Procedure: - Flexible cystoscope introduced, without any difficulty.   - Thorough search of the bladder revealed:    normal urethral meatus    normal urothelium    no stones    no ulcers     no tumors    no urethral polyps    no trabeculation  - Ureteral orifices were normal in position and appearance.  Post-Procedure: - Patient tolerated the procedure well  Assessment/ Plan:  1. Microscopic hematuria Status post negative cystoscopy today Microscopic blood likely secondary to nonobstructing stones - Urinalysis, Complete  2. 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 Einar Pheasant in case she like to pursue further work-up  Return in about 1 year (around 02/19/2019) for KUB with shannon.   Hollice Espy, MD

## 2018-02-20 ENCOUNTER — Telehealth: Payer: Self-pay | Admitting: Internal Medicine

## 2018-02-20 DIAGNOSIS — R935 Abnormal findings on diagnostic imaging of other abdominal regions, including retroperitoneum: Secondary | ICD-10-CM

## 2018-02-20 NOTE — Telephone Encounter (Signed)
Called pt and discussed her scan and appt with Dr Erlene Quan.  Discussed possible adnexal findings on CT.  Discussed gyn evaluation for possible pelvic ultrasound.  Pt agreeable.  Order placed for gyn referral.

## 2018-02-27 DIAGNOSIS — G43719 Chronic migraine without aura, intractable, without status migrainosus: Secondary | ICD-10-CM | POA: Diagnosis not present

## 2018-02-27 DIAGNOSIS — R52 Pain, unspecified: Secondary | ICD-10-CM | POA: Diagnosis not present

## 2018-02-27 DIAGNOSIS — G4723 Circadian rhythm sleep disorder, irregular sleep wake type: Secondary | ICD-10-CM | POA: Diagnosis not present

## 2018-03-08 ENCOUNTER — Other Ambulatory Visit: Payer: Self-pay

## 2018-03-08 ENCOUNTER — Ambulatory Visit
Admission: EM | Admit: 2018-03-08 | Discharge: 2018-03-08 | Disposition: A | Discharge status: Home / Self Care | Payer: Medicare Other | Attending: Family Medicine | Admitting: Family Medicine

## 2018-03-08 DIAGNOSIS — W57XXXA Bitten or stung by nonvenomous insect and other nonvenomous arthropods, initial encounter: Secondary | ICD-10-CM | POA: Diagnosis not present

## 2018-03-08 DIAGNOSIS — S1096XA Insect bite of unspecified part of neck, initial encounter: Secondary | ICD-10-CM

## 2018-03-08 MED ORDER — TRIAMCINOLONE ACETONIDE 0.5 % EX OINT: 1.0000 "application " | TOPICAL_OINTMENT | Freq: Two times a day (BID) | CUTANEOUS | 0 refills | Status: DC

## 2018-03-08 MED ORDER — DOXYCYCLINE HYCLATE 100 MG PO CAPS: 100.0000 mg | ORAL_CAPSULE | Freq: Two times a day (BID) | ORAL | 0 refills | Status: DC

## 2018-03-08 NOTE — ED Triage Notes (Signed)
Reports she was bitten by an insect on her left neck 4 days ago. Area has increased in size and is very itchy,

## 2018-03-08 NOTE — ED Provider Notes (Signed)
[facility]    CSN: [id number] Arrival date & time: 03/08/18  0920   History   Chief Complaint Chief Complaint  Patient presents with  . Insect Bite   HPI  72 year old female presents with a suspected insect bite.  Patient states that approximately 4 days ago she developed an area on the left side of her neck.  She believes it is an insect bite.  She states it is very itchy.  It has failed to improve despite use of topical Benadryl and an old prescription for triamcinolone 0.1%.  She states that it has gotten larger.  No purulent drainage.  No fever.  She does report that she just does not feel well.  No known exacerbating factors.  No other associated symptoms.  No other complaints.  Past Medical History:  Diagnosis Date  . Anxiety   . Arthritis   . Chicken pox   . Diverticulosis   . Fibrocystic breast disease   . GERD (gastroesophageal reflux disease)   . Migraine headache   . Neutropenia ([place])    worked up - Dr Oliva Bustard, slightly elevated ANA  . PUD (peptic ulcer disease)   . 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     Patient Active Problem List   Diagnosis Date Noted  . Hematuria 02/02/2018  . Nausea 09/05/2015  . Palpitations 04/27/2015  . Environmental allergies 12/26/2014  . Health care maintenance 12/26/2014  . Vaginal irritation 12/26/2014  . Anxiety 04/25/2014  . Fatigue 01/31/2014  . Gastritis, bile acid reflux 12/13/2012  . Migraine headache 09/21/2012  . Leukopenia 09/21/2012    Past Surgical History:  Procedure Laterality Date  . ABDOMINAL HYSTERECTOMY  1984  . ESOPHAGOGASTRODUODENOSCOPY (EGD) WITH PROPOFOL N/A 10/31/2015   Procedure: ESOPHAGOGASTRODUODENOSCOPY (EGD) WITH PROPOFOL;  Surgeon: Lollie Sails, MD;  Location: [facility] ENDOSCOPY;  Service: Endoscopy;  Laterality: N/A;    OB History   None      Home Medications    Prior to Admission medications   Medication Sig Start Date End Date Taking?  Authorizing Provider  AIMOVIG 140 DOSE 70 MG/ML SOAJ INJ 1 SYRINGE UTD Q 4 WKS 08/22/17   [provider]  ALPRAZolam Duanne Moron) 0.25 MG tablet Take one tablet daily prn 01/22/18   Einar Pheasant, MD  doxycycline (VIBRAMYCIN) 100 MG capsule Take 1 capsule (100 mg total) by mouth 2 (two) times daily. 03/08/18   Coral Spikes, DO  DULoxetine (CYMBALTA) 60 MG capsule Take 1 capsule (60 mg total) by mouth daily. 01/10/18   Einar Pheasant, MD  fluticasone (FLONASE) 50 MCG/ACT nasal spray SHAKE LIQUID AND USE 2 SPRAYS IN [facility] NOSTRIL DAILY 02/04/18   Einar Pheasant, MD  LINZESS 145 MCG CAPS capsule Take 145 mcg by mouth daily. 03/09/15   [provider]  loratadine (CLARITIN) 10 MG tablet Take 10 mg by mouth daily.    [provider]  propranolol (INDERAL) 40 MG tablet Take 1 tablet (40 mg total) by mouth daily. 10/29/17   Einar Pheasant, MD  RABEprazole (ACIPHEX) 20 MG tablet Take 1 tablet (20 mg total) by mouth daily. 01/28/18   Einar Pheasant, MD  rizatriptan (MAXALT) 10 MG tablet Take 10 mg by mouth as needed. May repeat in 2 hours if needed    [provider]  topiramate (TOPAMAX) 25 MG tablet TK 1 TO 2 TS PO HS 11/21/17   [provider]  triamcinolone ointment (KENALOG) 0.5 % Apply 1 application  topically 2 (two) times daily. 03/08/18   Coral Spikes, DO    Family History Family History  Problem Relation Age of Onset  . CAD Mother        s/p CABG  . Hypercholesterolemia Mother   . Breast cancer Mother 56  . Hypertension Father   . Migraines Father   . Hypercholesterolemia Father   . Migraines Sister   . Lymphoma Unknown        grandmother  . Colon cancer Neg Hx     Social History Social History   Tobacco Use  . Smoking status: Never Smoker  . Smokeless tobacco: Never Used  Substance Use Topics  . Alcohol use: Yes    Alcohol/week: 0.0 oz    Comment: wine occassionally  . Drug use: No     Allergies   Amitiza [lubiprostone] and Sulfa  antibiotics   Review of Systems Review of Systems  Constitutional: Negative for fever.  Skin:       Left neck - raised area, itching.   Physical Exam Triage Vital Signs ED Triage Vitals [03/08/18 0930]  Enc Vitals Group     BP (!) 142/67     Pulse Rate 70     Resp 16     Temp 98 F (36.7 C)     Temp Source Oral     SpO2 100 %     Weight 115 lb (52.2 kg)     Height 5\' 4"  (1.626 m)     Head Circumference      Peak Flow      Pain Score 2     Pain Loc      Pain Edu?      Excl. in [place]?    Updated Vital Signs BP (!) 142/67 (BP Location: Left Arm)   Pulse 70   Temp 98 F (36.7 C) (Oral)   Resp 16   Ht 5\' 4"  (1.626 m)   Wt 115 lb (52.2 kg)   SpO2 100%   BMI 19.74 kg/m     Physical Exam  Constitutional: She is oriented to person, place, and time. She appears well-developed. No distress.  HENT:  Head: Normocephalic and atraumatic.  Neck:  Left side of the neck at the sternomastoid with a 2.5 cm circular raised area.  Mild erythema.  No fluctuance.  No drainage.  Pulmonary/Chest: Effort normal. No respiratory distress.  Neurological: She is alert and oriented to person, place, and time.  Psychiatric: She has a normal mood and affect. Her behavior is normal.  Nursing note and vitals reviewed.  UC Treatments / Results  Labs (all labs ordered are listed, but only abnormal results are displayed) Labs Reviewed - No data to display  EKG None  Radiology No results found.  Procedures Procedures (including critical care time)  Medications Ordered in UC Medications - No data to display  Initial Impression / Assessment and Plan / UC Course  I have reviewed the triage vital signs and the nursing notes.  Pertinent labs & imaging results that were available during my care of the patient were reviewed by me and considered in my medical decision making (see chart for details).    72 year old female presents with a reported insect bite.  Placing patient on doxycycline  to cover for potential underlying infection.  Topical triamcinolone ointment to help with the itching.  Benadryl as needed.  Final Clinical Impressions(s) / UC Diagnoses   Final diagnoses:  Insect bite of neck, initial encounter  Discharge Instructions     Medications as prescribed.  If no improvement, call Dr. Nicki Reaper.  Take care  Dr. Lacinda Axon    ED Prescriptions    Medication Sig Dispense Auth. Provider   doxycycline (VIBRAMYCIN) 100 MG capsule Take 1 capsule (100 mg total) by mouth 2 (two) times daily. 14 capsule Jerelene Salaam G, DO   triamcinolone ointment (KENALOG) 0.5 % Apply 1 application topically 2 (two) times daily. 30 g Coral Spikes, DO     Controlled Substance Prescriptions [place] Controlled Substance Registry consulted? Not Applicable   Coral Spikes, DO 03/08/18 1000

## 2018-03-08 NOTE — Discharge Instructions (Signed)
Medications as prescribed.  If no improvement, call Dr. Nicki Reaper.  Take care  Dr. Lacinda Axon

## 2018-03-11 DIAGNOSIS — R9389 Abnormal findings on diagnostic imaging of other specified body structures: Secondary | ICD-10-CM | POA: Diagnosis not present

## 2018-03-11 DIAGNOSIS — N949 Unspecified condition associated with female genital organs and menstrual cycle: Secondary | ICD-10-CM | POA: Diagnosis not present

## 2018-03-26 ENCOUNTER — Other Ambulatory Visit: Payer: Self-pay | Admitting: Internal Medicine

## 2018-03-27 NOTE — Telephone Encounter (Signed)
Last visit on 01-22-18 Last filled on 01-22-18 rx refill

## 2018-03-27 NOTE — Telephone Encounter (Signed)
Patient is calling to check on the status of this. She states she only has 1 pill left and wanted to make sure the pharmacy sent a request.

## 2018-03-28 NOTE — Telephone Encounter (Signed)
Patient takes nightly at bedtime has one pill left can I send enough to last until PCP back in office?

## 2018-03-28 NOTE — Telephone Encounter (Signed)
Chart reviewed.  Drug database reviewed.  7 pills sent to pharmacy to cover.

## 2018-03-31 NOTE — Telephone Encounter (Signed)
Patient will need complete refill. She was only given 7 days

## 2018-04-01 ENCOUNTER — Telehealth: Payer: Self-pay | Admitting: Internal Medicine

## 2018-04-01 NOTE — Telephone Encounter (Signed)
Request was sent to provider for full script

## 2018-04-01 NOTE — Telephone Encounter (Signed)
Copied from [place] [id number]. Topic: Quick Communication - See Telephone Encounter >> Apr 01, 2018 11:16 AM Ahmed Prima L wrote: CRM for notification. See Telephone encounter for: 04/01/18.  Patient states that Dr Caryl Bis called in 7 pills for her to get her through the weekend for ALPRAZolam (XANAX) 0.25 MG tablet. She wants to make sure that Dr Nicki Reaper is aware and make sure she didn't forget to call in the remainder of the pills.  [facility] in [place]

## 2018-04-01 NOTE — Telephone Encounter (Signed)
Please advise 

## 2018-04-04 MED ORDER — ALPRAZOLAM 0.25 MG PO TABS: ORAL_TABLET | ORAL | 1 refills | Status: DC

## 2018-04-04 NOTE — Telephone Encounter (Signed)
Noted  

## 2018-04-04 NOTE — Telephone Encounter (Signed)
Dr. Caryl Bis refilled for 7 days. She is down to 2 pills. She will need her full script.

## 2018-04-04 NOTE — Telephone Encounter (Signed)
Patient called and states that she is down to 2 pills. She states can a nurse call her when the Rx is sent in CB# [phone]

## 2018-04-04 NOTE — Telephone Encounter (Signed)
ok'd refill for xanax #30 with one refill.

## 2018-04-04 NOTE — Telephone Encounter (Signed)
Patient calling back to check on status of xanax request

## 2018-04-04 NOTE — Telephone Encounter (Signed)
Script faxed. Left message letting patient know that it was sent to pharmacy

## 2018-04-21 ENCOUNTER — Other Ambulatory Visit: Payer: Self-pay | Admitting: Internal Medicine

## 2018-04-21 DIAGNOSIS — Z1231 Encounter for screening mammogram for malignant neoplasm of breast: Secondary | ICD-10-CM

## 2018-04-24 DIAGNOSIS — H2513 Age-related nuclear cataract, bilateral: Secondary | ICD-10-CM | POA: Diagnosis not present

## 2018-04-25 ENCOUNTER — Encounter: Payer: Self-pay | Admitting: Internal Medicine

## 2018-04-25 ENCOUNTER — Ambulatory Visit (INDEPENDENT_AMBULATORY_CARE_PROVIDER_SITE_OTHER): Payer: Medicare Other | Admitting: Internal Medicine

## 2018-04-25 DIAGNOSIS — D72819 Decreased white blood cell count, unspecified: Secondary | ICD-10-CM

## 2018-04-25 DIAGNOSIS — R319 Hematuria, unspecified: Secondary | ICD-10-CM

## 2018-04-25 DIAGNOSIS — G43709 Chronic migraine without aura, not intractable, without status migrainosus: Secondary | ICD-10-CM | POA: Diagnosis not present

## 2018-04-25 DIAGNOSIS — F419 Anxiety disorder, unspecified: Secondary | ICD-10-CM

## 2018-04-25 DIAGNOSIS — K2960 Other gastritis without bleeding: Secondary | ICD-10-CM | POA: Diagnosis not present

## 2018-04-25 MED ORDER — DULOXETINE HCL 60 MG PO CPEP: 60.0000 mg | ORAL_CAPSULE | Freq: Every day | ORAL | 0 refills | Status: DC

## 2018-04-25 MED ORDER — DULOXETINE HCL 60 MG PO CPEP: 60.0000 mg | ORAL_CAPSULE | Freq: Every day | ORAL | 1 refills | Status: DC

## 2018-04-25 NOTE — Progress Notes (Signed)
Patient ID: Brandi Ramos, female   DOB: 07/11/1946, 72 y.o.   MRN: [id number]   Subjective:    Patient ID: Brandi Ramos, female    DOB: 01/20/1946, 72 y.o.   MRN: [id number]  HPI  Patient here for a scheduled follow up.  She recently saw gyn secondary to adnexal finding on CT scan.  Reviewed note.  Felt stable finding for 8 years - per note.  Pelvic ultrasound ordered.  She also has seen urology for microscopic hematuria.  Negative cystoscopy. Felt microscopic blood likely secondary to non obstructing stones.  Recommended f/u KUB in one year.  Last seen 01/2018.  Tries to stay active.  She overall feels better.  Dealing with increased stress.  Does not feel needs any further intervention.  No chest pain.  No sob.  No acid reflux.  No abdominal pain.  Bowels moving.  aciphex working.  Needs refill.     Past Medical History:  Diagnosis Date  . Anxiety   . Arthritis   . Chicken pox   . Diverticulosis   . Fibrocystic breast disease   . GERD (gastroesophageal reflux disease)   . Migraine headache   . Neutropenia ([place])    worked up - Dr Oliva Bustard, slightly elevated ANA  . PUD (peptic ulcer disease)   . 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    Past Surgical History:  Procedure Laterality Date  . ABDOMINAL HYSTERECTOMY  1984  . ESOPHAGOGASTRODUODENOSCOPY (EGD) WITH PROPOFOL N/A 10/31/2015   Procedure: ESOPHAGOGASTRODUODENOSCOPY (EGD) WITH PROPOFOL;  Surgeon: Lollie Sails, MD;  Location: [facility] ENDOSCOPY;  Service: Endoscopy;  Laterality: N/A;   Family History  Problem Relation Age of Onset  . CAD Mother        s/p CABG  . Hypercholesterolemia Mother   . Breast cancer Mother 65  . Hypertension Father   . Migraines Father   . Hypercholesterolemia Father   . Migraines Sister   . Lymphoma Unknown        grandmother  . Colon cancer Neg Hx    Social History   Socioeconomic History  . Marital status: Widowed    Spouse name: Not on file  . Number of  children: 2  . Years of education: Not on file  . Highest education level: Not on file  Occupational History  . Not on file  Social Needs  . Financial resource strain: Not on file  . Food insecurity:    Worry: Not on file    Inability: Not on file  . Transportation needs:    Medical: Not on file    Non-medical: Not on file  Tobacco Use  . Smoking status: Never Smoker  . Smokeless tobacco: Never Used  Substance and Sexual Activity  . Alcohol use: Yes    Alcohol/week: 0.0 standard drinks    Comment: wine occassionally  . Drug use: No  . Sexual activity: Not Currently  Lifestyle  . Physical activity:    Days per week: Not on file    Minutes per session: Not on file  . Stress: Not on file  Relationships  . Social connections:    Talks on phone: Not on file    Gets together: Not on file    Attends religious service: Not on file    Active member of club or organization: Not on file    Attends meetings of clubs or organizations: Not on file    Relationship status: Not  on file  Other Topics Concern  . Not on file  Social History Narrative  . Not on file    Outpatient Encounter Medications as of 04/25/2018  Medication Sig  . AIMOVIG 140 DOSE 70 MG/ML SOAJ INJ 1 SYRINGE UTD Q 4 WKS  . ALPRAZolam (XANAX) 0.25 MG tablet TAKE 1 TABLET BY MOUTH EVERY DAY AS NEEDED  . doxycycline (VIBRAMYCIN) 100 MG capsule Take 1 capsule (100 mg total) by mouth 2 (two) times daily.  . DULoxetine (CYMBALTA) 60 MG capsule Take 1 capsule (60 mg total) by mouth daily.  . fluticasone (FLONASE) 50 MCG/ACT nasal spray SHAKE LIQUID AND USE 2 SPRAYS IN EACH NOSTRIL DAILY  . LINZESS 145 MCG CAPS capsule Take 145 mcg by mouth daily.  Marland Kitchen loratadine (CLARITIN) 10 MG tablet Take 10 mg by mouth daily.  . propranolol (INDERAL) 40 MG tablet Take 1 tablet (40 mg total) by mouth daily.  . RABEprazole (ACIPHEX) 20 MG tablet Take 1 tablet (20 mg total) by mouth daily.  . rizatriptan (MAXALT) 10 MG tablet Take 10 mg by  mouth as needed. May repeat in 2 hours if needed  . topiramate (TOPAMAX) 25 MG tablet TK 1 TO 2 TS PO HS  . triamcinolone ointment (KENALOG) 0.5 % Apply 1 application topically 2 (two) times daily.  . [DISCONTINUED] DULoxetine (CYMBALTA) 60 MG capsule Take 1 capsule (60 mg total) by mouth daily.  . [DISCONTINUED] DULoxetine (CYMBALTA) 60 MG capsule Take 1 capsule (60 mg total) by mouth daily.  . [DISCONTINUED] DULoxetine (CYMBALTA) 60 MG capsule Take 1 capsule (60 mg total) by mouth daily.   No facility-administered encounter medications on file as of 04/25/2018.     Review of Systems  Constitutional: Negative for appetite change and unexpected weight change.  HENT: Negative for congestion and sinus pressure.   Respiratory: Negative for cough, chest tightness and shortness of breath.   Cardiovascular: Negative for chest pain, palpitations and leg swelling.  Gastrointestinal: Negative for abdominal pain, diarrhea, nausea and vomiting.  Genitourinary: Negative for difficulty urinating and dysuria.  Musculoskeletal: Negative for joint swelling and myalgias.  Skin: Negative for color change and rash.  Neurological: Negative for dizziness, light-headedness and headaches.  Psychiatric/Behavioral: Negative for agitation and dysphoric mood.       Objective:     Blood pressure rechecked by me:  128/72  Physical Exam  Constitutional: She appears well-developed and well-nourished. No distress.  HENT:  Nose: Nose normal.  Mouth/Throat: Oropharynx is clear and moist.  Neck: Neck supple. No thyromegaly present.  Cardiovascular: Normal rate and regular rhythm.  Pulmonary/Chest: Breath sounds normal. No respiratory distress. She has no wheezes.  Abdominal: Soft. Bowel sounds are normal. There is no tenderness.  Musculoskeletal: She exhibits no edema or tenderness.  Lymphadenopathy:    She has no cervical adenopathy.  Skin: No rash noted. No erythema.  Psychiatric: She has a normal mood and  affect. Her behavior is normal.    BP 110/78   Pulse 72   Temp 98.3 F (36.8 C) (Oral)   Ht 5\' 4"  (1.626 m)   Wt 115 lb 9.6 oz (52.4 kg)   SpO2 98%   BMI 19.84 kg/m  Wt Readings from Last 3 Encounters:  04/25/18 115 lb 9.6 oz (52.4 kg)  03/08/18 115 lb (52.2 kg)  02/18/18 114 lb 12.8 oz (52.1 kg)     Lab Results  Component Value Date   WBC 4.2 12/03/2017   HGB 12.1 12/03/2017   HCT 36.2 12/03/2017  PLT 177.0 12/03/2017   GLUCOSE 118 (H) 12/03/2017   CHOL 165 12/03/2017   TRIG 112.0 12/03/2017   HDL 48.30 12/03/2017   LDLCALC 94 12/03/2017   ALT 11 12/03/2017   AST 21 12/03/2017   NA 142 12/03/2017   K 4.0 12/03/2017   CL 107 12/03/2017   CREATININE 0.80 01/21/2018   BUN 15 12/03/2017   CO2 30 12/03/2017   TSH 3.50 12/03/2017   HGBA1C 5.5 01/15/2018       Assessment & Plan:   Problem List Items Addressed This Visit    Anxiety    Overall stable.  Continue current medication regimen.  Follow.        Relevant Medications   DULoxetine (CYMBALTA) 60 MG capsule   Gastritis, bile acid reflux    On aciphex and doing well.  Has seen GI.  Follow.        Hematuria    Saw urology.  W/up as outlined.  Recommended f/u with urology and f/u KUB in one year.  Last seen 01/2018.        Leukopenia    Follow cbc.       Migraine headache    Followed by neurology.  Stable.        Relevant Medications   DULoxetine (CYMBALTA) 60 MG capsule       Einar Pheasant, MD

## 2018-04-29 ENCOUNTER — Telehealth: Payer: Self-pay | Admitting: Internal Medicine

## 2018-04-29 ENCOUNTER — Encounter: Payer: Self-pay | Admitting: Internal Medicine

## 2018-04-29 MED ORDER — RABEPRAZOLE SODIUM 20 MG PO TBEC: 20.0000 mg | DELAYED_RELEASE_TABLET | Freq: Every day | ORAL | 1 refills | Status: DC

## 2018-04-29 NOTE — Assessment & Plan Note (Signed)
On aciphex and doing well.  Has seen GI.  Follow.

## 2018-04-29 NOTE — Assessment & Plan Note (Signed)
Overall stable.  Continue current medication regimen.  Follow.

## 2018-04-29 NOTE — Assessment & Plan Note (Signed)
Followed by neurology.  Stable  

## 2018-04-29 NOTE — Assessment & Plan Note (Signed)
Follow [facility]bc.  

## 2018-04-29 NOTE — Telephone Encounter (Signed)
Copied from [place] [id number]. Topic: Quick Communication - Rx Refill/Question >> Apr 29, 2018 10:22 AM Alfredia Ferguson R wrote: Medication: RABEprazole (ACIPHEX) 20 MG tablet  Has the patient contacted their pharmacy? Yes Preferred Pharmacy (with phone number or street name): [facility] [place], [place] [facility] [facility] [phone] (Phone) [phone] (Fax)   Agent: Please be advised that RX refills may take up to 3 business days. We ask that you follow-up with your pharmacy.

## 2018-04-29 NOTE — Assessment & Plan Note (Signed)
Saw urology.  W/up as outlined.  Recommended f/u with urology and f/u KUB in one year.  Last seen 01/2018.

## 2018-05-21 ENCOUNTER — Ambulatory Visit
Admission: RE | Admit: 2018-05-21 | Discharge: 2018-05-21 | Disposition: A | Discharge status: Home / Self Care | Payer: Medicare Other | Source: Ambulatory Visit | Attending: Internal Medicine | Admitting: Internal Medicine

## 2018-05-21 DIAGNOSIS — Z1231 Encounter for screening mammogram for malignant neoplasm of breast: Secondary | ICD-10-CM | POA: Diagnosis not present

## 2018-06-03 ENCOUNTER — Other Ambulatory Visit: Payer: Self-pay | Admitting: Internal Medicine

## 2018-06-03 MED ORDER — ALPRAZOLAM 0.25 MG PO TABS: ORAL_TABLET | ORAL | 1 refills | Status: DC

## 2018-06-03 NOTE — Telephone Encounter (Signed)
rx request Patient last seen on 04-25-18 This was last refilled on 04-04-18

## 2018-06-03 NOTE — Telephone Encounter (Signed)
Copied from [place] [id number]. Topic: Quick Communication - Rx Refill/Question >> Jun 03, 2018  8:19 AM Scherrie Gerlach wrote: Medication: ALPRAZolam Duanne Moron) 0.25 MG tablet Pt states she called the pharmacy and has not heard anything so she called because she has one left. [facility] [place], [place] [facility] [address]ari Prows [phone] (Phone) [phone] (Fax)

## 2018-06-04 NOTE — Telephone Encounter (Signed)
faxed

## 2018-06-30 ENCOUNTER — Telehealth: Payer: Self-pay | Admitting: Internal Medicine

## 2018-06-30 DIAGNOSIS — Z23 Encounter for immunization: Secondary | ICD-10-CM | POA: Diagnosis not present

## 2018-07-04 ENCOUNTER — Other Ambulatory Visit: Payer: Self-pay

## 2018-07-04 MED ORDER — SUCRALFATE 1 G PO TABS: 1.0000 g | ORAL_TABLET | Freq: Two times a day (BID) | ORAL | 1 refills | Status: DC | PRN

## 2018-07-04 NOTE — Telephone Encounter (Signed)
Rx sent in. Pt aware

## 2018-07-04 NOTE — Telephone Encounter (Signed)
Pt requesting refill of sucralfate (CARAFATE) 1 g tablet for severe gastritis. Pt does not take daily but has flare ups and this helps. She is hoping for some to get her thru to December appt with Dr. Nicki Reaper. A partial fill would be ok. Please advise.  [facility] [place], [place] [facility] [address] Shari Prows [phone] (Phone) [phone] (Fax)

## 2018-07-09 DIAGNOSIS — G43719 Chronic migraine without aura, intractable, without status migrainosus: Secondary | ICD-10-CM | POA: Diagnosis not present

## 2018-07-09 DIAGNOSIS — R52 Pain, unspecified: Secondary | ICD-10-CM | POA: Diagnosis not present

## 2018-08-01 ENCOUNTER — Telehealth: Payer: Self-pay | Admitting: Internal Medicine

## 2018-08-01 NOTE — Telephone Encounter (Signed)
Copied from [place] [phone]. Topic: General - Other >> Aug 01, 2018 11:14 AM Brandi Ramos A wrote: Medication: ALPRAZolam Duanne Moron) 0.25 MG tablet  Has the patient contacted their pharmacy? Yes- pt says she initially called this request in 07/30/18. (Do not see request for that date- pt says she only has 2 pills left.)  Preferred Pharmacy (with phone number or street name):  [facility] [place], [place]  [phone] (Phone) [phone] (Fax)    Agent: Please be advised that RX refills may take up to 3 business days. We ask that you follow-up with your pharmacy.

## 2018-08-02 MED ORDER — ALPRAZOLAM 0.25 MG PO TABS: ORAL_TABLET | ORAL | 1 refills | Status: DC

## 2018-08-04 NOTE — Telephone Encounter (Signed)
I have sent refilled request for Dr. Bary Leriche approval.

## 2018-08-04 NOTE — Telephone Encounter (Signed)
Patient stated she is completely out of this medication, and would like something called in today.

## 2018-08-12 ENCOUNTER — Other Ambulatory Visit: Payer: Self-pay | Admitting: Gastroenterology

## 2018-08-12 DIAGNOSIS — R112 Nausea with vomiting, unspecified: Secondary | ICD-10-CM

## 2018-08-12 DIAGNOSIS — N200 Calculus of kidney: Secondary | ICD-10-CM | POA: Diagnosis not present

## 2018-08-12 DIAGNOSIS — N83201 Unspecified ovarian cyst, right side: Secondary | ICD-10-CM

## 2018-08-12 DIAGNOSIS — R1013 Epigastric pain: Secondary | ICD-10-CM

## 2018-08-12 DIAGNOSIS — Z86010 Personal history of colonic polyps: Secondary | ICD-10-CM | POA: Diagnosis not present

## 2018-08-19 ENCOUNTER — Ambulatory Visit: Payer: Medicare Other

## 2018-08-19 ENCOUNTER — Ambulatory Visit
Admission: RE | Admit: 2018-08-19 | Discharge: 2018-08-19 | Disposition: A | Discharge status: Home / Self Care | Payer: Medicare Other | Source: Ambulatory Visit | Attending: Gastroenterology | Admitting: Gastroenterology

## 2018-08-19 ENCOUNTER — Encounter
Admission: RE | Admit: 2018-08-19 | Discharge: 2018-08-19 | Disposition: A | Discharge status: Home / Self Care | Payer: Medicare Other | Source: Ambulatory Visit | Attending: Gastroenterology | Admitting: Gastroenterology

## 2018-08-19 DIAGNOSIS — N83201 Unspecified ovarian cyst, right side: Secondary | ICD-10-CM | POA: Insufficient documentation

## 2018-08-19 DIAGNOSIS — Z90710 Acquired absence of both cervix and uterus: Secondary | ICD-10-CM | POA: Diagnosis not present

## 2018-08-19 DIAGNOSIS — R1013 Epigastric pain: Secondary | ICD-10-CM | POA: Diagnosis not present

## 2018-08-19 DIAGNOSIS — R112 Nausea with vomiting, unspecified: Secondary | ICD-10-CM | POA: Insufficient documentation

## 2018-08-19 DIAGNOSIS — N200 Calculus of kidney: Secondary | ICD-10-CM | POA: Insufficient documentation

## 2018-08-19 MED ORDER — TECHNETIUM TC 99M MEBROFENIN IV KIT
5.0000 | PACK | Freq: Once | INTRAVENOUS | Status: AC | PRN
  Administered 2018-08-19: 5.071 via INTRAVENOUS

## 2018-08-22 ENCOUNTER — Ambulatory Visit: Payer: Medicare Other | Admitting: Internal Medicine

## 2018-08-27 ENCOUNTER — Ambulatory Visit (INDEPENDENT_AMBULATORY_CARE_PROVIDER_SITE_OTHER): Payer: Medicare Other | Admitting: Urology

## 2018-08-27 ENCOUNTER — Encounter: Payer: Self-pay | Admitting: Internal Medicine

## 2018-08-27 ENCOUNTER — Encounter: Payer: Self-pay | Admitting: Urology

## 2018-08-27 ENCOUNTER — Ambulatory Visit (INDEPENDENT_AMBULATORY_CARE_PROVIDER_SITE_OTHER): Payer: Medicare Other | Admitting: Internal Medicine

## 2018-08-27 VITALS — BP 122/60 | HR 74 | Temp 97.9°F | Ht 64.0 in | Wt 118.6 lb

## 2018-08-27 DIAGNOSIS — N200 Calculus of kidney: Secondary | ICD-10-CM | POA: Diagnosis not present

## 2018-08-27 DIAGNOSIS — K219 Gastro-esophageal reflux disease without esophagitis: Secondary | ICD-10-CM | POA: Diagnosis not present

## 2018-08-27 DIAGNOSIS — G43709 Chronic migraine without aura, not intractable, without status migrainosus: Secondary | ICD-10-CM

## 2018-08-27 DIAGNOSIS — Z23 Encounter for immunization: Secondary | ICD-10-CM | POA: Diagnosis not present

## 2018-08-27 DIAGNOSIS — R319 Hematuria, unspecified: Secondary | ICD-10-CM | POA: Diagnosis not present

## 2018-08-27 DIAGNOSIS — F419 Anxiety disorder, unspecified: Secondary | ICD-10-CM

## 2018-08-27 DIAGNOSIS — N9489 Other specified conditions associated with female genital organs and menstrual cycle: Secondary | ICD-10-CM | POA: Insufficient documentation

## 2018-08-27 DIAGNOSIS — Z9109 Other allergy status, other than to drugs and biological substances: Secondary | ICD-10-CM | POA: Diagnosis not present

## 2018-08-27 NOTE — Progress Notes (Signed)
Patient ID: Brandi Ramos, female   DOB: 1946/06/24, 72 y.o.   MRN: [id number]   Subjective:    Patient ID: Brandi Ramos, female    DOB: 11/25/1945, 72 y.o.   MRN: [id number]  HPI  Patient here for a scheduled follow up.  She reports she is doing relatively well.  Recently underwent w/up for microscopic hematuria.  Saw urology.  Non obstructive stone present.  Cystoscopy normal.  Has f/u today.   Has been seeing GI for dyspepsia.  Has history of gastritis.  On PPI and carafate.  Had recent ultrasound and HIDA - unrevealing.  Scheduled for colonoscopy.  Persistent nausea and epigastric discomfort.  Eating helps some.  Discussed EGD.  Will contact GI about adding EGD to colonoscopy.  No chest pain.  No sob.  Bowels stable.  Handling stress.     Past Medical History:  Diagnosis Date  . Anxiety   . Arthritis   . Chicken pox   . Diverticulosis   . Fibrocystic breast disease   . GERD (gastroesophageal reflux disease)   . Migraine headache   . Neutropenia ([place])    worked up - Dr Oliva Bustard, slightly elevated ANA  . PUD (peptic ulcer disease)   . 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    Past Surgical History:  Procedure Laterality Date  . ABDOMINAL HYSTERECTOMY  1984  . ESOPHAGOGASTRODUODENOSCOPY (EGD) WITH PROPOFOL N/A 10/31/2015   Procedure: ESOPHAGOGASTRODUODENOSCOPY (EGD) WITH PROPOFOL;  Surgeon: Lollie Sails, MD;  Location: [facility] ENDOSCOPY;  Service: Endoscopy;  Laterality: N/A;   Family History  Problem Relation Age of Onset  . CAD Mother        s/p CABG  . Hypercholesterolemia Mother   . Breast cancer Mother 3  . Hypertension Father   . Migraines Father   . Hypercholesterolemia Father   . Migraines Sister   . Lymphoma Other        grandmother  . Colon cancer Neg Hx    Social History   Socioeconomic History  . Marital status: Widowed    Spouse name: Not on file  . Number of children: 2  . Years of education: Not on file  . Highest  education level: Not on file  Occupational History  . Not on file  Social Needs  . Financial resource strain: Not on file  . Food insecurity:    Worry: Not on file    Inability: Not on file  . Transportation needs:    Medical: Not on file    Non-medical: Not on file  Tobacco Use  . Smoking status: Never Smoker  . Smokeless tobacco: Never Used  Substance and Sexual Activity  . Alcohol use: Yes    Alcohol/week: 0.0 standard drinks    Comment: wine occassionally  . Drug use: No  . Sexual activity: Not Currently  Lifestyle  . Physical activity:    Days per week: Not on file    Minutes per session: Not on file  . Stress: Not on file  Relationships  . Social connections:    Talks on phone: Not on file    Gets together: Not on file    Attends religious service: Not on file    Active member of club or organization: Not on file    Attends meetings of clubs or organizations: Not on file    Relationship status: Not on file  Other Topics Concern  . Not on file  Social  History Narrative  . Not on file    Outpatient Encounter Medications as of 08/27/2018  Medication Sig  . AIMOVIG 140 DOSE 70 MG/ML SOAJ INJ 1 SYRINGE UTD Q 4 WKS  . ALPRAZolam (XANAX) 0.25 MG tablet TAKE 1 TABLET BY MOUTH EVERY DAY AS NEEDED  . DULoxetine (CYMBALTA) 60 MG capsule Take 1 capsule (60 mg total) by mouth daily.  . fluticasone (FLONASE) 50 MCG/ACT nasal spray SHAKE LIQUID AND USE 2 SPRAYS IN EACH NOSTRIL DAILY  . LINZESS 145 MCG CAPS capsule Take 145 mcg by mouth daily.  Marland Kitchen loratadine (CLARITIN) 10 MG tablet Take 10 mg by mouth daily.  . RABEprazole (ACIPHEX) 20 MG tablet Take 1 tablet (20 mg total) by mouth daily.  . rizatriptan (MAXALT) 10 MG tablet Take 10 mg by mouth as needed. May repeat in 2 hours if needed  . sucralfate (CARAFATE) 1 g tablet Take 1 tablet (1 g total) by mouth 2 (two) times daily as needed.  . topiramate (TOPAMAX) 25 MG tablet TK 1 TO 2 TS PO HS  . triamcinolone ointment (KENALOG)  0.5 % Apply 1 application topically 2 (two) times daily.  . [DISCONTINUED] doxycycline (VIBRAMYCIN) 100 MG capsule Take 1 capsule (100 mg total) by mouth 2 (two) times daily.  . [DISCONTINUED] propranolol (INDERAL) 40 MG tablet Take 1 tablet (40 mg total) by mouth daily. (Patient taking differently: Take 80 mg by mouth daily. )   No facility-administered encounter medications on file as of 08/27/2018.     Review of Systems  Constitutional: Negative for appetite change and unexpected weight change.  HENT: Negative for congestion and sinus pressure.   Respiratory: Negative for cough, chest tightness and shortness of breath.   Cardiovascular: Negative for chest pain, palpitations and leg swelling.  Gastrointestinal: Negative for diarrhea and vomiting.       Epigastric pain as outlined.    Genitourinary: Negative for difficulty urinating and dysuria.  Musculoskeletal: Negative for joint swelling and myalgias.  Skin: Negative for color change and rash.  Neurological: Negative for dizziness, light-headedness and headaches.  Psychiatric/Behavioral: Negative for agitation and dysphoric mood.       Objective:    Physical Exam Constitutional:      General: She is not in acute distress.    Appearance: Normal appearance.  HENT:     Nose: Nose normal. No congestion.     Mouth/Throat:     Pharynx: No oropharyngeal exudate or posterior oropharyngeal erythema.  Neck:     Musculoskeletal: Neck supple.     Thyroid: No thyromegaly.  Cardiovascular:     Rate and Rhythm: Normal rate and regular rhythm.  Pulmonary:     Effort: No respiratory distress.     Breath sounds: Normal breath sounds. No wheezing.  Abdominal:     General: Bowel sounds are normal.     Palpations: Abdomen is soft.     Tenderness: There is no abdominal tenderness.  Musculoskeletal:        General: No swelling or tenderness.  Lymphadenopathy:     Cervical: No cervical adenopathy.  Skin:    Findings: No erythema or rash.   Neurological:     Mental Status: She is alert.  Psychiatric:        Mood and Affect: Mood normal.        Behavior: Behavior normal.     BP 122/60 (BP Location: Left Arm, Patient Position: Sitting, Cuff Size: Normal)   Pulse 74   Temp 97.9 F (36.6 C)  Ht 5\' 4"  (1.626 m)   Wt 118 lb 9.6 oz (53.8 kg)   SpO2 98%   BMI 20.36 kg/m  Wt Readings from Last 3 Encounters:  08/27/18 118 lb 9.6 oz (53.8 kg)  04/25/18 115 lb 9.6 oz (52.4 kg)  03/08/18 115 lb (52.2 kg)     Lab Results  Component Value Date   WBC 4.2 12/03/2017   HGB 12.1 12/03/2017   HCT 36.2 12/03/2017   PLT 177.0 12/03/2017   GLUCOSE 118 (H) 12/03/2017   CHOL 165 12/03/2017   TRIG 112.0 12/03/2017   HDL 48.30 12/03/2017   LDLCALC 94 12/03/2017   ALT 11 12/03/2017   AST 21 12/03/2017   NA 142 12/03/2017   K 4.0 12/03/2017   CL 107 12/03/2017   CREATININE 0.80 01/21/2018   BUN 15 12/03/2017   CO2 30 12/03/2017   TSH 3.50 12/03/2017   HGBA1C 5.5 01/15/2018    US Abdomen Complete  Result Date: 08/19/2018 CLINICAL DATA:  Nausea vomiting. EXAM: ABDOMEN ULTRASOUND COMPLETE COMPARISON:  CT 01/21/2018. FINDINGS: Gallbladder: No gallstones or wall thickening visualized. No sonographic Murphy sign noted by sonographer. Common bile duct: Diameter: 3.4 mm Liver: No focal lesion identified. Within normal limits in parenchymal echogenicity. Portal vein is patent on color Doppler imaging with normal direction of blood flow towards the liver. IVC: No abnormality visualized. Pancreas: Visualized portion unremarkable. Spleen: Size and appearance within normal limits. Right Kidney: Length: 11.2 cm. Echogenicity within normal limits. 7 mm kidney stone. No mass or hydronephrosis visualized. Left Kidney: Length: 9.1 cm. Echogenicity within normal limits. 3 mm kidney stone. No mass or hydronephrosis visualized. Abdominal aorta: No aneurysm visualized. Other findings: None. IMPRESSION: 1.  Bilateral nonobstructing nephrolithiasis. 2.  No acute abnormality identified. No gallstones or biliary distention. Electronically Signed   By: Marcello Moores  Register   On: 08/19/2018 15:01   Nm Hepato W/eject Fract  Result Date: 08/19/2018 CLINICAL DATA:  Nausea and vomiting with epigastric region pain EXAM: NUCLEAR MEDICINE HEPATOBILIARY IMAGING WITH GALLBLADDER EF VIEWS: Anterior right upper quadrant RADIOPHARMACEUTICALS:  5.071 mCi Tc-43m  Choletec IV COMPARISON:  None. FINDINGS: Liver uptake of radiotracer is unremarkable. There is prompt visualization of gallbladder and small bowel, indicating patency of the cystic and common bile ducts. The patient consumed 8 ounces of Ensure orally with calculation of the computer generated ejection fraction of radiotracer from the gallbladder. Patient did not experience clinical symptoms with the oral Ensure consumption. The computer generated ejection fraction of radiotracer from the gallbladder is normal at 62%, normal greater than 33% using the oral agent. IMPRESSION: Study within normal limits. Electronically Signed   By: Lowella Grip III M.D.   On: 08/19/2018 15:15   US Pelvic Complete With Transvaginal  Result Date: 08/19/2018 CLINICAL DATA:  Nausea and vomiting status post hysterectomy history of right ovarian cyst EXAM: TRANSABDOMINAL AND TRANSVAGINAL ULTRASOUND OF PELVIS TECHNIQUE: Both transabdominal and transvaginal ultrasound examinations of the pelvis were performed. Transabdominal technique was performed for global imaging of the pelvis including uterus, ovaries, adnexal regions, and pelvic cul-de-sac. It was necessary to proceed with endovaginal exam following the transabdominal exam to visualize the adnexa. COMPARISON:  CT 11/16/2015 FINDINGS: Uterus Surgically absent Endometrium Surgically absent Right ovary Not seen Left ovary Not seen Other findings No abnormal free fluid. IMPRESSION: 1. Status post hysterectomy 2. Ovaries are not discretely visualized. Electronically Signed   By: Donavan Foil M.D.   On: 08/19/2018 22:01       Assessment & Plan:  Problem List Items Addressed This Visit    Anxiety    Continue current regimen.  Overall stable.  Follow.        Environmental allergies    Stable on current regimen.  Follow.        GERD (gastroesophageal reflux disease)    Persistent epigastric pain and nausea as outlined.  Seeing GI.  Had recent ultrasound and HIDA scan - unrevealing.  Discuss with GI regarding adding EGD with colonoscopy scheduled.        Hematuria    Seeing urology.  Has f/u today.        Migraine headache    Followed by neurology.  Stable.         Other Visit Diagnoses    Need for 23-polyvalent pneumococcal polysaccharide vaccine    -  Primary   Relevant Orders   Pneumococcal polysaccharide vaccine 23-valent greater than or equal to 2yo subcutaneous/IM (Completed)       Einar Pheasant, MD

## 2018-08-27 NOTE — Progress Notes (Signed)
   08/27/2018 4:27 PM   Brandi Ramos 1946/08/07 [id number]  Reason for visit: Nephrolithiasis, nausea  HPI: I saw Ms. Brandi Ramos in urology clinic today for discussion of nephrolithiasis and nausea.  She is a 72 year old female who recently underwent microscopic hematuria work-up with Dr. Erlene Quan that showed an 8 mm right nonobstructive renal lower pole stone, and cystoscopy was normal.  She has reported multiple month history of central epigastric pain and nausea, and is wondering if this could be related to her stone.  She denies any gross hematuria, flank pain, or history of stone passage.  I discussed with her at length that her nonobstructing lower pole stone would not cause epigastric pain or nausea.  I discussed symptoms to watch for including flank or groin pain, or renal colic.  She has follow-up in 6 months with Zara Council, PA for surveillance KUB.  If she did elect treatment of this nonobstructive stone, she would be a good candidate for shockwave lithotripsy, as stone clearly visible on scout CT, 800HU, and SSD is 7 cm.  A total of 10 minutes were spent face-to-face with the patient, greater than 50% was spent in patient education, counseling, and coordination of care regarding nonobstructive nephrolithiasis, surveillance, and possible treatment options.  [place], [facility] [address] [place], [place] [address] [phone]

## 2018-08-31 ENCOUNTER — Encounter: Payer: Self-pay | Admitting: Internal Medicine

## 2018-08-31 NOTE — Assessment & Plan Note (Signed)
Continue current regimen.  Overall stable.  Follow.

## 2018-08-31 NOTE — Assessment & Plan Note (Signed)
Seeing urology.  Has f/u today.

## 2018-08-31 NOTE — Assessment & Plan Note (Signed)
Followed by neurology.  Stable  

## 2018-08-31 NOTE — Assessment & Plan Note (Signed)
Stable on current regimen.  Follow.   

## 2018-08-31 NOTE — Assessment & Plan Note (Signed)
Persistent epigastric pain and nausea as outlined.  Seeing GI.  Had recent ultrasound and HIDA scan - unrevealing.  Discuss with GI regarding adding EGD with colonoscopy scheduled.

## 2018-09-23 DIAGNOSIS — M79672 Pain in left foot: Secondary | ICD-10-CM | POA: Diagnosis not present

## 2018-09-23 DIAGNOSIS — G5762 Lesion of plantar nerve, left lower limb: Secondary | ICD-10-CM | POA: Diagnosis not present

## 2018-09-25 ENCOUNTER — Encounter: Payer: Self-pay | Admitting: *Deleted

## 2018-09-26 ENCOUNTER — Ambulatory Visit: Payer: Medicare Other | Admitting: Certified Registered Nurse Anesthetist

## 2018-09-26 ENCOUNTER — Encounter: Payer: Self-pay | Admitting: *Deleted

## 2018-09-26 ENCOUNTER — Ambulatory Visit
Admission: RE | Admit: 2018-09-26 | Discharge: 2018-09-26 | Disposition: A | Discharge status: Home / Self Care | Payer: Medicare Other | Source: Ambulatory Visit | Attending: Gastroenterology | Admitting: Gastroenterology

## 2018-09-26 ENCOUNTER — Other Ambulatory Visit: Payer: Self-pay | Admitting: Gastroenterology

## 2018-09-26 ENCOUNTER — Encounter
Admission: RE | Disposition: A | Discharge status: Home / Self Care | Payer: Self-pay | Source: Ambulatory Visit | Attending: Gastroenterology

## 2018-09-26 DIAGNOSIS — R110 Nausea: Secondary | ICD-10-CM | POA: Diagnosis not present

## 2018-09-26 DIAGNOSIS — K3189 Other diseases of stomach and duodenum: Secondary | ICD-10-CM | POA: Diagnosis not present

## 2018-09-26 DIAGNOSIS — K219 Gastro-esophageal reflux disease without esophagitis: Secondary | ICD-10-CM | POA: Insufficient documentation

## 2018-09-26 DIAGNOSIS — K228 Other specified diseases of esophagus: Secondary | ICD-10-CM | POA: Insufficient documentation

## 2018-09-26 DIAGNOSIS — R935 Abnormal findings on diagnostic imaging of other abdominal regions, including retroperitoneum: Secondary | ICD-10-CM

## 2018-09-26 DIAGNOSIS — F329 Major depressive disorder, single episode, unspecified: Secondary | ICD-10-CM | POA: Insufficient documentation

## 2018-09-26 DIAGNOSIS — K2950 Unspecified chronic gastritis without bleeding: Secondary | ICD-10-CM | POA: Diagnosis not present

## 2018-09-26 DIAGNOSIS — Z882 Allergy status to sulfonamides status: Secondary | ICD-10-CM | POA: Insufficient documentation

## 2018-09-26 DIAGNOSIS — G43909 Migraine, unspecified, not intractable, without status migrainosus: Secondary | ICD-10-CM | POA: Diagnosis not present

## 2018-09-26 DIAGNOSIS — Z79899 Other long term (current) drug therapy: Secondary | ICD-10-CM | POA: Insufficient documentation

## 2018-09-26 DIAGNOSIS — R933 Abnormal findings on diagnostic imaging of other parts of digestive tract: Secondary | ICD-10-CM | POA: Diagnosis not present

## 2018-09-26 DIAGNOSIS — K449 Diaphragmatic hernia without obstruction or gangrene: Secondary | ICD-10-CM | POA: Diagnosis not present

## 2018-09-26 DIAGNOSIS — Z8711 Personal history of peptic ulcer disease: Secondary | ICD-10-CM | POA: Diagnosis not present

## 2018-09-26 DIAGNOSIS — Z791 Long term (current) use of non-steroidal anti-inflammatories (NSAID): Secondary | ICD-10-CM | POA: Diagnosis not present

## 2018-09-26 DIAGNOSIS — Z86010 Personal history of colonic polyps: Secondary | ICD-10-CM | POA: Insufficient documentation

## 2018-09-26 DIAGNOSIS — K210 Gastro-esophageal reflux disease with esophagitis: Secondary | ICD-10-CM | POA: Diagnosis not present

## 2018-09-26 DIAGNOSIS — K2970 Gastritis, unspecified, without bleeding: Secondary | ICD-10-CM | POA: Diagnosis not present

## 2018-09-26 DIAGNOSIS — F419 Anxiety disorder, unspecified: Secondary | ICD-10-CM | POA: Diagnosis not present

## 2018-09-26 DIAGNOSIS — K635 Polyp of colon: Secondary | ICD-10-CM | POA: Diagnosis not present

## 2018-09-26 DIAGNOSIS — R1013 Epigastric pain: Secondary | ICD-10-CM | POA: Diagnosis not present

## 2018-09-26 HISTORY — DX: Depression, unspecified: F32.A

## 2018-09-26 HISTORY — DX: Benign neoplasm of stomach: D13.1

## 2018-09-26 HISTORY — DX: Acute vaginitis: N76.0

## 2018-09-26 HISTORY — DX: Personal history of other diseases of the digestive system: Z87.19

## 2018-09-26 HISTORY — DX: Other seasonal allergic rhinitis: J30.2

## 2018-09-26 HISTORY — DX: Adhesive capsulitis of unspecified shoulder: M75.00

## 2018-09-26 HISTORY — PX: ESOPHAGOGASTRODUODENOSCOPY (EGD) WITH PROPOFOL: SHX5813

## 2018-09-26 HISTORY — DX: Major depressive disorder, single episode, unspecified: F32.9

## 2018-09-26 HISTORY — PX: COLONOSCOPY WITH PROPOFOL: SHX5780

## 2018-09-26 LAB — HM COLONOSCOPY

## 2018-09-26 SURGERY — COLONOSCOPY WITH PROPOFOL
Anesthesia: General

## 2018-09-26 MED ORDER — ONDANSETRON HCL 4 MG/2ML IJ SOLN
INTRAMUSCULAR | Status: DC | PRN
  Administered 2018-09-26: 4 mg via INTRAVENOUS

## 2018-09-26 MED ORDER — LIDOCAINE HCL (CARDIAC) PF 100 MG/5ML IV SOSY
PREFILLED_SYRINGE | INTRAVENOUS | Status: DC | PRN
  Administered 2018-09-26: 60 mg via INTRAVENOUS

## 2018-09-26 MED ORDER — SODIUM CHLORIDE 0.9 % IV SOLN
INTRAVENOUS | Status: DC
  Administered 2018-09-26: 10:00:00 via INTRAVENOUS
  Administered 2018-09-26: 1000 mL via INTRAVENOUS

## 2018-09-26 MED ORDER — PROPOFOL 10 MG/ML IV BOLUS
INTRAVENOUS | Status: AC
  Filled 2018-09-26: qty 80

## 2018-09-26 MED ORDER — FENTANYL CITRATE (PF) 100 MCG/2ML IJ SOLN
INTRAMUSCULAR | Status: DC | PRN
  Administered 2018-09-26 (×4): 25 ug via INTRAVENOUS

## 2018-09-26 MED ORDER — PHENYLEPHRINE HCL 10 MG/ML IJ SOLN
INTRAMUSCULAR | Status: DC | PRN
  Administered 2018-09-26 (×2): 100 ug via INTRAVENOUS

## 2018-09-26 MED ORDER — FENTANYL CITRATE (PF) 100 MCG/2ML IJ SOLN
INTRAMUSCULAR | Status: AC
  Filled 2018-09-26: qty 2

## 2018-09-26 MED ORDER — PROPOFOL 500 MG/50ML IV EMUL
INTRAVENOUS | Status: DC | PRN
  Administered 2018-09-26: 100 ug/kg/min via INTRAVENOUS

## 2018-09-26 MED ORDER — PROPOFOL 10 MG/ML IV BOLUS
INTRAVENOUS | Status: DC | PRN
  Administered 2018-09-26: 50 mg via INTRAVENOUS

## 2018-09-26 NOTE — Op Note (Signed)
[facility] Gastroenterology Patient Name: Brandi Ramos Procedure Date: 09/26/2018 9:44 AM MRN: [id number] Account #: [id number] Date of Birth: [birth year] Admit Type: Outpatient Age: 73 Room: [facility] ENDO ROOM 1 Gender: Female Note Status: Finalized Procedure:            Colonoscopy Indications:          Personal history of colonic polyps, Abnormal CT of the                        GI tract Providers:            Lollie Sails, MD Referring MD:         Einar Pheasant, MD (Referring MD) Medicines:            Monitored Anesthesia Care Complications:        No immediate complications. Procedure:            Pre-Anesthesia Assessment:                       - ASA Grade Assessment: II - A patient with mild                        systemic disease.                       After obtaining informed consent, the colonoscope was                        passed under direct vision. Throughout the procedure,                        the patient's blood pressure, pulse, and oxygen                        saturations were monitored continuously. The                        Colonoscope was introduced through the anus and                        advanced to the the cecum, identified by appendiceal                        orifice and ileocecal valve. The colonoscopy was                        performed without difficulty. The patient tolerated the                        procedure well. The quality of the bowel preparation                        was good. Findings:      A 3 mm polyp was found in the mid sigmoid colon. The polyp was sessile.       The polyp was removed with a cold biopsy forceps. Resection and       retrieval were complete.      The exam was otherwise normal throughout the examined colon.      The cecum, appendiceal orifice and IC valve were normal in appearance.      The  digital rectal exam was normal. Impression:           - One 3 mm polyp in the mid sigmoid colon,  removed with                        a cold biopsy forceps. Resected and retrieved. Recommendation:       - Await pathology results.                       - Telephone GI clinic for pathology results in 1 week. Procedure Code(s):    --- Professional ---                       [id number], Colonoscopy, flexible; with biopsy, single or                        multiple CPT copyright 2018 American Medical Association. All rights reserved. The codes documented in this report are preliminary and upon coder review may  be revised to meet current compliance requirements. Lollie Sails, MD 09/26/2018 11:13:26 AM This report has been signed electronically. Number of Addenda: 0 Note Initiated On: 09/26/2018 9:44 AM Scope Withdrawal Time: 0 hours 8 minutes 39 seconds  Total Procedure Duration: 0 hours 23 minutes 1 second       [facility]

## 2018-09-26 NOTE — Anesthesia Post-op Follow-up Note (Signed)
Anesthesia QCDR form completed.        

## 2018-09-26 NOTE — Anesthesia Postprocedure Evaluation (Signed)
Anesthesia Post Note  Patient: LEANAH KOLANDER  Procedure(s) Performed: COLONOSCOPY WITH PROPOFOL (N/A ) ESOPHAGOGASTRODUODENOSCOPY (EGD) WITH PROPOFOL (N/A )  Patient location during evaluation: Endoscopy Anesthesia Type: General Level of consciousness: awake and alert and oriented Pain management: pain level controlled Vital Signs Assessment: post-procedure vital signs reviewed and stable Respiratory status: spontaneous breathing, nonlabored ventilation and respiratory function stable Cardiovascular status: blood pressure returned to baseline and stable Postop Assessment: no signs of nausea or vomiting Anesthetic complications: no     Last Vitals:  Vitals:   09/26/18 1113 09/26/18 1133  BP: (!) 123/44 (!) 152/65  Pulse: 82 74  Resp: 18 16  Temp: (!) 36.1 C   SpO2: 100% 100%    Last Pain:  Vitals:   09/26/18 1113  TempSrc: Tympanic  PainSc:                  Yaritsa Savarino

## 2018-09-26 NOTE — Op Note (Signed)
[facility] Gastroenterology Patient Name: Brandi Ramos Procedure Date: 09/26/2018 9:45 AM MRN: [id number] Account #: [id number] Date of Birth: 1946-08-03 Admit Type: Outpatient Age: 73 Room: [facility] ENDO ROOM 1 Gender: Female Note Status: Finalized Procedure:            Upper GI endoscopy Indications:          Epigastric abdominal pain, Nausea Providers:            Lollie Sails, MD Referring MD:         Einar Pheasant, MD (Referring MD) Medicines:            Monitored Anesthesia Care Complications:        No immediate complications. Procedure:            Pre-Anesthesia Assessment:                       - ASA Grade Assessment: III - A patient with severe                        systemic disease.                       After obtaining informed consent, the endoscope was                        passed under direct vision. Throughout the procedure,                        the patient's blood pressure, pulse, and oxygen                        saturations were monitored continuously. The Endoscope                        was introduced through the mouth, and advanced to the                        third part of duodenum. The upper GI endoscopy was                        accomplished without difficulty. The patient tolerated                        the procedure well. Findings:      The Z-line was variable. Biopsies were taken with a cold forceps for       histology.      A small to medium hiatal hernia was found. The Z-line was a variable       distance from incisors; the hiatal hernia was sliding.      Diffuse and patchy minimal inflammation characterized by congestion       (edema) and erythema was found in the gastric body and in the gastric       antrum. Biopsies were taken with a cold forceps for histology.      A single 5 mm mucosal papule (nodule) with no bleeding and no stigmata       of recent bleeding was found on the anterior wall of the gastric antrum.     Biopsies were taken with a cold forceps for histology.      The cardia and gastric fundus were normal on  retroflexion otherwise.      Diffuse minimal mucosal variance characterized by altered texture was       found in the entire duodenum. Biopsies were taken with a cold forceps       for histology.      The exam of the duodenum was otherwise normal. Impression:           - Z-line variable. Biopsied.                       - Small hiatal hernia.                       - Gastritis. Biopsied.                       - A single mucosal papule (nodule) found in the                        stomach. Biopsied.                       - Mucosal variant in the duodenum. Biopsied. Recommendation:       - Discharge patient to home.                       - Continue present medications.                       - Use Aciphex (rabeprazole) 20 mg PO daily daily. Procedure Code(s):    --- Professional ---                       [id number], Esophagogastroduodenoscopy, flexible, transoral;                        with biopsy, single or multiple Diagnosis Code(s):    --- Professional ---                       K22.8, Other specified diseases of esophagus                       K44.9, Diaphragmatic hernia without obstruction or                        gangrene                       K29.70, Gastritis, unspecified, without bleeding                       K31.89, Other diseases of stomach and duodenum                       R10.13, Epigastric pain                       R11.0, Nausea CPT copyright 2018 American Medical Association. All rights reserved. The codes documented in this report are preliminary and upon coder review may  be revised to meet current compliance requirements. Lollie Sails, MD 09/26/2018 10:42:21 AM This report has been signed electronically. Number of Addenda: 0 Note Initiated On: 09/26/2018 9:45 AM      [facility]

## 2018-09-26 NOTE — Transfer of Care (Signed)
Immediate Anesthesia Transfer of Care Note  Patient: Brandi Ramos  Procedure(s) Performed: COLONOSCOPY WITH PROPOFOL (N/A ) ESOPHAGOGASTRODUODENOSCOPY (EGD) WITH PROPOFOL (N/A )  Patient Location: PACU and Endoscopy Unit  Anesthesia Type:General  Level of Consciousness: awake, drowsy and patient cooperative  Airway & Oxygen Therapy: Patient Spontanous Breathing  Post-op Assessment: Report given to RN and Post -op Vital signs reviewed and stable  Post vital signs: Reviewed and stable  Last Vitals:  Vitals Value Taken Time  BP 123/44 09/26/2018 11:14 AM  Temp 36.1 C 09/26/2018 11:13 AM  Pulse 83 09/26/2018 11:14 AM  Resp 20 09/26/2018 11:14 AM  SpO2 100 % 09/26/2018 11:14 AM  Vitals shown include unvalidated device data.  Last Pain:  Vitals:   09/26/18 1113  TempSrc: Tympanic  PainSc:          Complications: No apparent anesthesia complications

## 2018-09-26 NOTE — H&P (Signed)
Outpatient short stay form Pre-procedure 09/26/2018 9:40 AM Brandi Sails MD  Primary Physician: Einar Pheasant, MD  Reason for visit: EGD and colonoscopy  History of present illness: Patient is a 73 year old female presenting today for an EGD and colonoscopy in regards to history of gastritis dyspepsia.  He does have problems with epigastric pain and nausea.  Had a evaluation on her gallbladder occluding CCK study all of which was normal.  Further she has a history of pelvic lesion differential between a ovarian versus appendiceal mucocele versus right adnexal cyst.  Does have a personal history of adenomatous colon polyps with her last colonoscopy being 10/13/2013.  She was placed on Carafate and is feeling quite a bit better.  No abdominal pain diarrhea or rectal bleeding.    Current Facility-Administered Medications:  .  0.9 %  sodium chloride infusion, , Intravenous, Continuous, Brandi Sails, MD  Medications Prior to Admission  Medication Sig Dispense Refill Last Dose  . meloxicam (MOBIC) 15 MG tablet Take 15 mg by mouth daily.     Marland Kitchen AIMOVIG 140 DOSE 70 MG/ML SOAJ INJ 1 SYRINGE UTD Q 4 WKS  11 Taking  . ALPRAZolam (XANAX) 0.25 MG tablet TAKE 1 TABLET BY MOUTH EVERY DAY AS NEEDED 30 tablet 1 Taking  . DULoxetine (CYMBALTA) 60 MG capsule Take 1 capsule (60 mg total) by mouth daily. 90 capsule 1 Taking  . fluticasone (FLONASE) 50 MCG/ACT nasal spray SHAKE LIQUID AND USE 2 SPRAYS IN EACH NOSTRIL DAILY 16 g 11 Taking  . LINZESS 145 MCG CAPS capsule Take 145 mcg by mouth daily.  3 Taking  . loratadine (CLARITIN) 10 MG tablet Take 10 mg by mouth daily.   Taking  . propranolol ER (INDERAL LA) 80 MG 24 hr capsule Take 80 mg by mouth daily.  11   . RABEprazole (ACIPHEX) 20 MG tablet Take 1 tablet (20 mg total) by mouth daily. 90 tablet 1 Taking  . rizatriptan (MAXALT) 10 MG tablet Take 10 mg by mouth as needed. May repeat in 2 hours if needed   Taking  . sucralfate (CARAFATE) 1 g  tablet Take 1 tablet (1 g total) by mouth 2 (two) times daily as needed. 60 tablet 1 Taking  . topiramate (TOPAMAX) 25 MG tablet TK 1 TO 2 TS PO HS  3 Taking  . triamcinolone ointment (KENALOG) 0.5 % Apply 1 application topically 2 (two) times daily. 30 g 0 Taking     Allergies  Allergen Reactions  . Amitiza [Lubiprostone] Other (See Comments)  . Sulfa Antibiotics      Past Medical History:  Diagnosis Date  . Anxiety   . Arthritis   . Chicken pox   . Depression   . Diverticulosis   . Fibrocystic breast disease   . Frozen shoulder   . Fundic gland polyps of stomach, benign   . GERD (gastroesophageal reflux disease)   . History of diverticulitis of colon   . Migraine headache   . Neutropenia ([place])    worked up - Dr Oliva Bustard, slightly elevated ANA  . PUD (peptic ulcer disease)   . Recurrent vaginitis   . Seasonal allergies   . 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     Review of systems:      Physical Exam    Heart and lungs: Regular rate and rhythm without rub or gallop, lungs are bilaterally clear    HEENT: Normocephalic atraumatic eyes are anicteric  Other:    Pertinant exam for procedure: Soft nontender nondistended bowel sounds positive normoactive    Planned proceedures: EGD, colonoscopy and indicated procedures. I have discussed the risks benefits and complications of procedures to include not limited to bleeding, infection, perforation and the risk of sedation and the patient wishes to proceed.    Brandi Sails, MD Gastroenterology 09/26/2018  9:40 AM

## 2018-09-26 NOTE — Anesthesia Preprocedure Evaluation (Signed)
Anesthesia Evaluation  Patient identified by MRN, date of birth, ID band Patient awake    Reviewed: Allergy & Precautions, NPO status , Patient's Chart, lab work & pertinent test results  History of Anesthesia Complications Negative for: history of anesthetic complications  Airway Mallampati: III  TM Distance: >3 FB Neck ROM: Full    Dental no notable dental hx.    Pulmonary neg pulmonary ROS, neg sleep apnea, neg COPD,    breath sounds clear to auscultation- rhonchi (-) wheezing      Cardiovascular Exercise Tolerance: Good (-) hypertension(-) CAD, (-) Past MI, (-) Cardiac Stents and (-) CABG  Rhythm:Regular Rate:Normal - Systolic murmurs and - Diastolic murmurs    Neuro/Psych  Headaches, PSYCHIATRIC DISORDERS Anxiety Depression    GI/Hepatic Neg liver ROS, PUD, GERD  ,  Endo/Other  negative endocrine ROSneg diabetes  Renal/GU negative Renal ROS     Musculoskeletal  (+) Arthritis ,   Abdominal (+) - obese,   Peds  Hematology negative hematology ROS (+)   Anesthesia Other Findings Past Medical History: No date: Anxiety No date: Arthritis No date: Chicken pox No date: Depression No date: Diverticulosis No date: Fibrocystic breast disease No date: Frozen shoulder No date: Fundic gland polyps of stomach, benign No date: GERD (gastroesophageal reflux disease) No date: History of diverticulitis of colon No date: Migraine headache No date: Neutropenia (H[facility])     Comment:  worked up - Dr Oliva Bustard, slightly elevated ANA No date: PUD (peptic ulcer disease) No date: Recurrent vaginitis No date: Seasonal allergies No date: Skin cancer, 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    Reproductive/Obstetrics                             Anesthesia Physical Anesthesia Plan  ASA: II  Anesthesia Plan: General   Post-op Pain Management:    Induction: Intravenous  PONV Risk  Score and Plan: 2 and Propofol infusion  Airway Management Planned: Natural Airway  Additional Equipment:   Intra-op Plan:   Post-operative Plan:   Informed Consent: I have reviewed the patients History and Physical, chart, labs and discussed the procedure including the risks, benefits and alternatives for the proposed anesthesia with the patient or authorized representative who has indicated his/her understanding and acceptance.     Dental advisory given  Plan Discussed with: CRNA and Anesthesiologist  Anesthesia Plan Comments:         Anesthesia Quick Evaluation

## 2018-09-30 LAB — SURGICAL PATHOLOGY

## 2018-10-03 ENCOUNTER — Other Ambulatory Visit: Payer: Self-pay | Admitting: Internal Medicine

## 2018-10-03 MED ORDER — ALPRAZOLAM 0.25 MG PO TABS: ORAL_TABLET | ORAL | 1 refills | Status: DC

## 2018-10-03 NOTE — Telephone Encounter (Signed)
rx ok'd for xanax #30 with 1 refill.   

## 2018-10-03 NOTE — Telephone Encounter (Signed)
Requested medication (s) are due for refill today: yes  Requested medication (s) are on the active medication list: yes  Last refill:  08/02/18  Future visit scheduled: yes  Notes to clinic:  Not delegated    Requested Prescriptions  Pending Prescriptions Disp Refills   ALPRAZolam (XANAX) 0.25 MG tablet 30 tablet 1    Sig: TAKE 1 TABLET BY MOUTH EVERY DAY AS NEEDED     Not Delegated - Psychiatry:  Anxiolytics/Hypnotics Failed - 10/03/2018  9:39 AM      Failed - This refill cannot be delegated      Failed - Urine Drug Screen completed in last 360 days.      Passed - Valid encounter within last 6 months    Recent Outpatient Visits          1 month ago Need for 23-polyvalent pneumococcal polysaccharide vaccine   [facility] [place], Randell Patient, MD   5 months ago Anxiety   [facility] [place], Randell Patient, MD   8 months ago Ear lesion   [facility] Einar Pheasant, MD   10 months ago Dysuria   [facility] [place], Randell Patient, MD   1 year ago Encounter for immunization   [facility] Einar Pheasant, MD      Future Appointments            In 2 months Einar Pheasant, MD [place], [place]   In 4 months [place], Gordan Payment [facility]

## 2018-10-03 NOTE — Telephone Encounter (Signed)
Copied from [place]. Topic: Quick Communication - See Telephone Encounter >> Oct 03, 2018  9:36 AM Conception Chancy, NT wrote: CRM for notification. See Telephone encounter for: 10/03/18.  Patient is calling and requesting a refill on ALPRAZolam (XANAX) 0.25 MG tablet.  [facility] [place], [place] AT [facility] OF [address] [place] [address] Phone: [phone] Fax: [phone]

## 2018-10-15 ENCOUNTER — Ambulatory Visit
Admission: RE | Admit: 2018-10-15 | Discharge: 2018-10-15 | Disposition: A | Discharge status: Home / Self Care | Payer: Medicare Other | Source: Ambulatory Visit | Attending: Gastroenterology | Admitting: Gastroenterology

## 2018-10-15 ENCOUNTER — Other Ambulatory Visit
Admission: RE | Admit: 2018-10-15 | Discharge: 2018-10-15 | Disposition: A | Discharge status: Home / Self Care | Payer: Medicare Other | Source: Home / Self Care | Attending: Gastroenterology | Admitting: Gastroenterology

## 2018-10-15 DIAGNOSIS — N200 Calculus of kidney: Secondary | ICD-10-CM | POA: Diagnosis not present

## 2018-10-15 DIAGNOSIS — R935 Abnormal findings on diagnostic imaging of other abdominal regions, including retroperitoneum: Secondary | ICD-10-CM | POA: Diagnosis not present

## 2018-10-15 LAB — CREATININE, SERUM
Creatinine, Ser: 0.74 mg/dL (ref 0.44–1.00)
GFR calc Af Amer: >60 mL/min (ref >60)
GFR calc non Af Amer: >60 mL/min (ref >60)

## 2018-10-15 MED ORDER — IOHEXOL 300 MG/ML  SOLN
100.0000 mL | Freq: Once | INTRAMUSCULAR | Status: AC | PRN
  Administered 2018-10-15: 85 mL via INTRAVENOUS

## 2018-10-20 ENCOUNTER — Other Ambulatory Visit: Payer: Self-pay | Admitting: Internal Medicine

## 2018-10-20 MED ORDER — DULOXETINE HCL 60 MG PO CPEP: 60.0000 mg | ORAL_CAPSULE | Freq: Every day | ORAL | 0 refills | Status: DC

## 2018-10-20 NOTE — Telephone Encounter (Signed)
Patient has appointment 12/26/18 Requested Prescriptions  Pending Prescriptions Disp Refills  . DULoxetine (CYMBALTA) 60 MG capsule 90 capsule 0    Sig: Take 1 capsule (60 mg total) by mouth daily.     Psychiatry: Antidepressants - SNRI Failed - 10/20/2018 10:01 AM      Failed - Last BP in normal range    BP Readings from Last 1 Encounters:  09/26/18 (!) 152/65         Passed - Completed PHQ-2 or PHQ-9 in the last 360 days.      Passed - Valid encounter within last 6 months    Recent Outpatient Visits          1 month ago Need for 23-polyvalent pneumococcal polysaccharide vaccine   [facility] [place], Randell Patient, MD   5 months ago Anxiety   [facility] [place], Randell Patient, MD   9 months ago Ear lesion   [facility] Einar Pheasant, MD   10 months ago Dysuria   [facility] [place], Randell Patient, MD   1 year ago Encounter for immunization   [facility] Einar Pheasant, MD      Future Appointments            In 2 months Einar Pheasant, MD [place], [place]   In 4 months [place], [place] Payment [facility]

## 2018-10-20 NOTE — Telephone Encounter (Signed)
Copied from [place] [id number]. Topic: Quick Communication - Rx Refill/Question >> Oct 20, 2018  9:56 AM Antonieta Iba C wrote: Medication: DULoxetine (CYMBALTA) 60 MG capsule--- 90 day supply  Has the patient contacted their pharmacy? Yes  (Agent: If no, request that the patient contact the pharmacy for the refill.) (Agent: If yes, when and what did the pharmacy advise?)  Preferred Pharmacy (with phone number or street name): [facility] [place], [place] to [facility] [employer] [facility] [phone] (Phone) [phone] (Fax)    Agent: Please be advised that RX refills may take up to 3 business days. We ask that you follow-up with your pharmacy.

## 2018-10-23 DIAGNOSIS — R52 Pain, unspecified: Secondary | ICD-10-CM | POA: Diagnosis not present

## 2018-10-23 DIAGNOSIS — G43719 Chronic migraine without aura, intractable, without status migrainosus: Secondary | ICD-10-CM | POA: Diagnosis not present

## 2018-10-24 ENCOUNTER — Ambulatory Visit: Payer: Self-pay | Admitting: General Surgery

## 2018-10-24 DIAGNOSIS — D490 Neoplasm of unspecified behavior of digestive system: Secondary | ICD-10-CM | POA: Diagnosis not present

## 2018-10-24 NOTE — H&P (View-Only) (Signed)
PATIENT PROFILE: Brandi Ramos is a 73 y.o. 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  Patient has a history of CT scan done in 2011 that was found with a tubular structures on the right lower quadrant that was suspected to be a mucocele.  In the last few years patient has been evaluated due to nausea and epigastric pain.  Patient has had multiple CT scan that continues to shows that tubular structures on the right lower quadrant.  Radiologist report that tubular structure can be coming from the right adnexa or an appendiceal mucocele.  In the last 9 to 10 years there has not been any changes on the tubular structure on CT scan imaging.  On May 2019 she was evaluated by gynecologist that assess that the tubular structure is seen coming from the ovary and she does not recommend any intervention.  He continued to be evaluated by GI which repeated another CT scan 2 weeks ago and was found to continue to have that told the structure.  Again mentioned that the possibility of mucocele is present.  Patient refers mild episodic epigastric pain and nausea.  No pain radiation.  No alleviating or aggravating factor.  She was consulted to surgery for resection of the appendix due to the mucocele.   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 12/29/2012   Cervicalgia 05/21/2012   Primary localized osteoarthrosis, shoulder region 05/06/2012   Adhesive capsulitis of shoulder 05/06/2012   Recurrent vaginitis 11/19/2011   Migraines 11/19/2011   Seasonal allergies 11/19/2011   GERD (gastroesophageal reflux  disease) 11/19/2011   Depression 11/19/2011   H/O diverticulitis of colon 11/19/2011   Overview    Hospitalized for 9 days in 2003         GENERAL REVIEW OF SYSTEMS:   General ROS: negative for - chills, fatigue, fever, weight gain or weight loss Allergy and Immunology ROS: negative for - hives  Hematological and Lymphatic ROS: negative for - bleeding problems or bruising, negative for palpable nodes Endocrine ROS: negative for - heat or cold intolerance, hair changes Respiratory ROS: negative for - cough, shortness of breath or wheezing Cardiovascular ROS: no chest pain or palpitations GI ROS: negative for vomiting, diarrhea, constipation.  Positive for abdominal pain and nausea Musculoskeletal ROS: negative for - joint swelling or muscle pain Neurological ROS: negative for - confusion, syncope Dermatological ROS: negative for pruritus and rash Psychiatric: Positive for anxiety, negative for depression, difficulty sleeping and memory loss  MEDICATIONS: CurrentMedications        Current Outpatient Medications  Medication Sig Dispense Refill  . AIMOVIG AUTOINJECTOR 140 mg/mL AtIn   11  . ALPRAZolam (XANAX) 0.25 MG tablet Take by mouth.    . DULoxetine (CYMBALTA) 60 MG DR capsule Take by mouth.    . fluticasone (FLONASE) 50 mcg/actuation nasal spray by Nasal route.    Marland Kitchen LINZESS 145 mcg capsule TAKE ONE CAPSULE BY MOUTH EVERY DAY 90 capsule 0  . loratadine (CLARITIN) 10 mg tablet Take by mouth.    Marland Kitchen  propranolol (INDERAL) 40 MG tablet Take by mouth.    . RABEprazole (ACIPHEX) 20 mg EC tablet     . rizatriptan (MAXALT) 10 MG tablet Take by mouth.    . sucralfate (CARAFATE) 1 gram tablet Take by mouth.    . sucralfate (CARAFATE) 1 gram tablet Take 1 tablet (1 g total) by mouth 2 (two) times daily before meals 60 tablet 1  . topiramate (TOPAMAX) 50 MG tablet Take by mouth.    . meloxicam (MOBIC) 15 MG tablet Take 1 tablet (15 mg total) by mouth once  daily. Take with food (Patient not taking: Reported on 10/24/2018  ) 30 tablet 2   No current facility-administered medications for this visit.       ALLERGIES: Lubiprostone and Sulfa (sulfonamide antibiotics)  PAST MEDICAL HISTORY:     Past Medical History:  Diagnosis Date  . Depression   . Diverticulitis   . Frozen shoulder   . Fundic gland polyps of stomach, benign 10/31/2015  . GERD (gastroesophageal reflux disease)   . H/O diverticulitis of colon   . Migraines   . Recurrent vaginitis   . Seasonal allergies     PAST SURGICAL HISTORY:      Past Surgical History:  Procedure Laterality Date  . COLONOSCOPY    . COLONOSCOPY  09/26/2018   Negative colon biopsy/PHx CP/Repeat 46yr/MUS  . EGD  10/31/2015   Fundic gland polyps of stomach, benign/Reflux esophagitis/Gastritis/No Repeat/MUS  . EGD  09/26/2018   Gastritis/GERD/No Repeat/MUS  . HYSTERECTOMY  1984   heavy bleeding  . UPPER GASTROINTESTINAL ENDOSCOPY       FAMILY HISTORY:      Family History  Problem Relation Age of Onset  . Heart disease Mother   . Breast cancer Mother   . Lung cancer Mother   . Lung cancer Father   . Arthritis Sister      SOCIAL HISTORY: Social History          Socioeconomic History  . Marital status: Widowed    Spouse name: Not on file  . Number of children: Not on file  . Years of education: Not on file  . Highest education level: Not on file  Occupational History  . Occupation: Retired  SScientific laboratory technician . Financial resource strain: Not on file  . Food insecurity:    Worry: Not on file    Inability: Not on file  . Transportation needs:    Medical: Not on file    Non-medical: Not on file  Tobacco Use  . Smoking status: Never Smoker  . Smokeless tobacco: Never Used  Substance and Sexual Activity  . Alcohol use: No    Alcohol/week: 0.0 standard drinks  . Drug use: No  . Sexual activity: Not Currently    Birth  control/protection: Abstinence, Surgical    Comment: Widowed for 12 years  Other Topics Concern  . Not on file  Social History Narrative  . Not on file      PHYSICAL EXAM:    Vitals:   10/24/18 1103  BP: 135/70  Pulse: 71   Body mass index is 20.42 kg/m. Weight: 54 kg (119 lb)   GENERAL: Alert, active, oriented x3  HEENT: Pupils equal reactive to light. Extraocular movements are intact. Sclera clear. Palpebral conjunctiva normal red color.Pharynx clear.  NECK: Supple with no palpable mass and no adenopathy.  LUNGS: Sound clear with no rales rhonchi or wheezes.  HEART: Regular rhythm S1 and S2 without murmur.  ABDOMEN: Soft  and depressible, nontender with no palpable mass, no hepatomegaly.   EXTREMITIES: Well-developed well-nourished symmetrical with no dependent edema.  NEUROLOGICAL: Awake alert oriented, facial expression symmetrical, moving all extremities.  REVIEW OF DATA: I have reviewed the following data today:      Office Visit on 08/12/2018  Component Date Value  . WBC (White Blood Cell Co* 08/12/2018 5.3   . RBC (Red Blood Cell Coun* 08/12/2018 4.37   . Hemoglobin 08/12/2018 12.6   . Hematocrit 08/12/2018 38.9   . MCV (Mean Corpuscular Vo* 08/12/2018 89.0   . MCH (Mean Corpuscular He* 08/12/2018 28.8   . MCHC (Mean Corpuscular H* 08/12/2018 32.4   . Platelet Count 08/12/2018 162   . RDW-CV (Red Cell Distrib* 08/12/2018 13.3   . MPV (Mean Platelet Volum* 08/12/2018 10.1   . Neutrophils 08/12/2018 2.85   . Lymphocytes 08/12/2018 1.76   . Monocytes 08/12/2018 0.52   . Eosinophils 08/12/2018 0.12   . Basophils 08/12/2018 0.02   . Neutrophil % 08/12/2018 54.0   . Lymphocyte % 08/12/2018 33.3   . Monocyte % 08/12/2018 9.8   . Eosinophil % 08/12/2018 2.3   . Basophil% 08/12/2018 0.4   . Immature Granulocyte % 08/12/2018 0.2   . Immature Granulocyte Cou* 08/12/2018 0.01   . Glucose 08/12/2018 84   . Sodium 08/12/2018 143   . Potassium  08/12/2018 4.5   . Chloride 08/12/2018 107   . Carbon Dioxide (CO2) 08/12/2018 29.6   . Urea Nitrogen (BUN) 08/12/2018 18   . Creatinine 08/12/2018 0.9   . Glomerular Filtration Ra* 08/12/2018 62   . Calcium 08/12/2018 9.3   . AST  08/12/2018 20   . ALT  08/12/2018 9   . Alk Phos (alkaline Phosp* 08/12/2018 55   . Albumin 08/12/2018 4.2   . Bilirubin, Total 08/12/2018 0.4   . Protein, Total 08/12/2018 6.7   . A/G Ratio 08/12/2018 1.7      ASSESSMENT: Ms. Rollyson is a 73 y.o. female presenting for consultation for appendiceal mucocele.  Patient was oriented about the CT scan finding of a tubular structure in the right lower quadrant.  I was very clear to the patient that I was unable to determine if the tubular structures comes from the appendix or any other structure on the right upper quadrant.  I was also clear to the patient that, that the lesion being there for 9 years without any changes on size or shape, the possibility of these being malignant lesion or mucocele is very low.  I also had a long discussion with the patient that an alternative to see what the stool associated can be is to do diagnostic laparoscopy.  During the diagnostic laparoscopy most likely will have appendectomy.  I discussed with the patient the low chances that the tubular structure can be coming from the ovary.  I will discuss the case with Dr. Leonides Schanz, who previously evaluated the patient for this reason to see if she would like to be available today of the surgery in case an ovarian lesion is present.  I discussed the patient about the procedure of laparoscopic appendectomy, the risk and benefit of the surgery.  Risk discussed included: Bleeding, infection, leaking from suture line, small bowel injury, ureter injury, small bowel obstruction, small bowel fistula, among others.  I was very clear to the patient that there are some chances that one the appendix is evaluated after surgery it can come back negative for  any lesion.  If that is the  case and the ovary also looks normal patient will need further evaluation of the area.  Patient refers that she agrees and understand but the structure is causing her anxiety and she wants it out.  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 Laparoscopic appendectomy (82099) 2. Avoid taking aspirin 5 days before surgery  3. Will discuss with Dr. Leonides Schanz in case you need excision of right ovary.  4. Contact us if has any question or concern.   Patient verbalized understanding, all questions were answered, and were agreeable with the plan outlined above.   This was a 60 minutes encounter, most of the time counseling patient, oriented about different scenarios, and coordinating plan of care.  Herbert Pun, MD  Electronically signed by Herbert Pun, MD

## 2018-10-24 NOTE — H&P (Signed)
PATIENT PROFILE: Brandi Ramos is a 73 y.o. 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  Patient has a history of CT scan done in 2011 that was found with a tubular structures on the right lower quadrant that was suspected to be a mucocele.  In the last few years patient has been evaluated due to nausea and epigastric pain.  Patient has had multiple CT scan that continues to shows that tubular structures on the right lower quadrant.  Radiologist report that tubular structure can be coming from the right adnexa or an appendiceal mucocele.  In the last 9 to 10 years there has not been any changes on the tubular structure on CT scan imaging.  On May 2019 she was evaluated by gynecologist that assess that the tubular structure is seen coming from the ovary and she does not recommend any intervention.  He continued to be evaluated by GI which repeated another CT scan 2 weeks ago and was found to continue to have that told the structure.  Again mentioned that the possibility of mucocele is present.  Patient refers mild episodic epigastric pain and nausea.  No pain radiation.  No alleviating or aggravating factor.  She was consulted to surgery for resection of the appendix due to the mucocele.   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 12/29/2012   Cervicalgia 05/21/2012   Primary localized osteoarthrosis, shoulder region 05/06/2012   Adhesive capsulitis of shoulder 05/06/2012   Recurrent vaginitis 11/19/2011   Migraines 11/19/2011   Seasonal allergies 11/19/2011   GERD (gastroesophageal reflux  disease) 11/19/2011   Depression 11/19/2011   H/O diverticulitis of colon 11/19/2011   Overview    Hospitalized for 9 days in 2003         GENERAL REVIEW OF SYSTEMS:   General ROS: negative for - chills, fatigue, fever, weight gain or weight loss Allergy and Immunology ROS: negative for - hives  Hematological and Lymphatic ROS: negative for - bleeding problems or bruising, negative for palpable nodes Endocrine ROS: negative for - heat or cold intolerance, hair changes Respiratory ROS: negative for - cough, shortness of breath or wheezing Cardiovascular ROS: no chest pain or palpitations GI ROS: negative for vomiting, diarrhea, constipation.  Positive for abdominal pain and nausea Musculoskeletal ROS: negative for - joint swelling or muscle pain Neurological ROS: negative for - confusion, syncope Dermatological ROS: negative for pruritus and rash Psychiatric: Positive for anxiety, negative for depression, difficulty sleeping and memory loss  MEDICATIONS: CurrentMedications        Current Outpatient Medications  Medication Sig Dispense Refill  . AIMOVIG AUTOINJECTOR 140 mg/mL AtIn   11  . ALPRAZolam (XANAX) 0.25 MG tablet Take by mouth.    . DULoxetine (CYMBALTA) 60 MG DR capsule Take by mouth.    . fluticasone (FLONASE) 50 mcg/actuation nasal spray by Nasal route.    Marland Kitchen LINZESS 145 mcg capsule TAKE ONE CAPSULE BY MOUTH EVERY DAY 90 capsule 0  . loratadine (CLARITIN) 10 mg tablet Take by mouth.    Marland Kitchen  propranolol (INDERAL) 40 MG tablet Take by mouth.    . RABEprazole (ACIPHEX) 20 mg EC tablet     . rizatriptan (MAXALT) 10 MG tablet Take by mouth.    . sucralfate (CARAFATE) 1 gram tablet Take by mouth.    . sucralfate (CARAFATE) 1 gram tablet Take 1 tablet (1 g total) by mouth 2 (two) times daily before meals 60 tablet 1  . topiramate (TOPAMAX) 50 MG tablet Take by mouth.    . meloxicam (MOBIC) 15 MG tablet Take 1 tablet (15 mg total) by mouth once  daily. Take with food (Patient not taking: Reported on 10/24/2018  ) 30 tablet 2   No current facility-administered medications for this visit.       ALLERGIES: Lubiprostone and Sulfa (sulfonamide antibiotics)  PAST MEDICAL HISTORY:     Past Medical History:  Diagnosis Date  . Depression   . Diverticulitis   . Frozen shoulder   . Fundic gland polyps of stomach, benign 10/31/2015  . GERD (gastroesophageal reflux disease)   . H/O diverticulitis of colon   . Migraines   . Recurrent vaginitis   . Seasonal allergies     PAST SURGICAL HISTORY:      Past Surgical History:  Procedure Laterality Date  . COLONOSCOPY    . COLONOSCOPY  09/26/2018   Negative colon biopsy/PHx CP/Repeat 46yr/MUS  . EGD  10/31/2015   Fundic gland polyps of stomach, benign/Reflux esophagitis/Gastritis/No Repeat/MUS  . EGD  09/26/2018   Gastritis/GERD/No Repeat/MUS  . HYSTERECTOMY  1984   heavy bleeding  . UPPER GASTROINTESTINAL ENDOSCOPY       FAMILY HISTORY:      Family History  Problem Relation Age of Onset  . Heart disease Mother   . Breast cancer Mother   . Lung cancer Mother   . Lung cancer Father   . Arthritis Sister      SOCIAL HISTORY: Social History          Socioeconomic History  . Marital status: Widowed    Spouse name: Not on file  . Number of children: Not on file  . Years of education: Not on file  . Highest education level: Not on file  Occupational History  . Occupation: Retired  SScientific laboratory technician . Financial resource strain: Not on file  . Food insecurity:    Worry: Not on file    Inability: Not on file  . Transportation needs:    Medical: Not on file    Non-medical: Not on file  Tobacco Use  . Smoking status: Never Smoker  . Smokeless tobacco: Never Used  Substance and Sexual Activity  . Alcohol use: No    Alcohol/week: 0.0 standard drinks  . Drug use: No  . Sexual activity: Not Currently    Birth  control/protection: Abstinence, Surgical    Comment: Widowed for 12 years  Other Topics Concern  . Not on file  Social History Narrative  . Not on file      PHYSICAL EXAM:    Vitals:   10/24/18 1103  BP: 135/70  Pulse: 71   Body mass index is 20.42 kg/m. Weight: 54 kg (119 lb)   GENERAL: Alert, active, oriented x3  HEENT: Pupils equal reactive to light. Extraocular movements are intact. Sclera clear. Palpebral conjunctiva normal red color.Pharynx clear.  NECK: Supple with no palpable mass and no adenopathy.  LUNGS: Sound clear with no rales rhonchi or wheezes.  HEART: Regular rhythm S1 and S2 without murmur.  ABDOMEN: Soft  and depressible, nontender with no palpable mass, no hepatomegaly.   EXTREMITIES: Well-developed well-nourished symmetrical with no dependent edema.  NEUROLOGICAL: Awake alert oriented, facial expression symmetrical, moving all extremities.  REVIEW OF DATA: I have reviewed the following data today:      Office Visit on 08/12/2018  Component Date Value  . WBC (White Blood Cell Co* 08/12/2018 5.3   . RBC (Red Blood Cell Coun* 08/12/2018 4.37   . Hemoglobin 08/12/2018 12.6   . Hematocrit 08/12/2018 38.9   . MCV (Mean Corpuscular Vo* 08/12/2018 89.0   . MCH (Mean Corpuscular He* 08/12/2018 28.8   . MCHC (Mean Corpuscular H* 08/12/2018 32.4   . Platelet Count 08/12/2018 162   . RDW-CV (Red Cell Distrib* 08/12/2018 13.3   . MPV (Mean Platelet Volum* 08/12/2018 10.1   . Neutrophils 08/12/2018 2.85   . Lymphocytes 08/12/2018 1.76   . Monocytes 08/12/2018 0.52   . Eosinophils 08/12/2018 0.12   . Basophils 08/12/2018 0.02   . Neutrophil % 08/12/2018 54.0   . Lymphocyte % 08/12/2018 33.3   . Monocyte % 08/12/2018 9.8   . Eosinophil % 08/12/2018 2.3   . Basophil% 08/12/2018 0.4   . Immature Granulocyte % 08/12/2018 0.2   . Immature Granulocyte Cou* 08/12/2018 0.01   . Glucose 08/12/2018 84   . Sodium 08/12/2018 143   . Potassium  08/12/2018 4.5   . Chloride 08/12/2018 107   . Carbon Dioxide (CO2) 08/12/2018 29.6   . Urea Nitrogen (BUN) 08/12/2018 18   . Creatinine 08/12/2018 0.9   . Glomerular Filtration Ra* 08/12/2018 62   . Calcium 08/12/2018 9.3   . AST  08/12/2018 20   . ALT  08/12/2018 9   . Alk Phos (alkaline Phosp* 08/12/2018 55   . Albumin 08/12/2018 4.2   . Bilirubin, Total 08/12/2018 0.4   . Protein, Total 08/12/2018 6.7   . A/G Ratio 08/12/2018 1.7      ASSESSMENT: Brandi Ramos is a 73 y.o. female presenting for consultation for appendiceal mucocele.  Patient was oriented about the CT scan finding of a tubular structure in the right lower quadrant.  I was very clear to the patient that I was unable to determine if the tubular structures comes from the appendix or any other structure on the right upper quadrant.  I was also clear to the patient that, that the lesion being there for 9 years without any changes on size or shape, the possibility of these being malignant lesion or mucocele is very low.  I also had a long discussion with the patient that an alternative to see what the stool associated can be is to do diagnostic laparoscopy.  During the diagnostic laparoscopy most likely will have appendectomy.  I discussed with the patient the low chances that the tubular structure can be coming from the ovary.  I will discuss the case with Dr. Leonides Schanz, who previously evaluated the patient for this reason to see if she would like to be available today of the surgery in case an ovarian lesion is present.  I discussed the patient about the procedure of laparoscopic appendectomy, the risk and benefit of the surgery.  Risk discussed included: Bleeding, infection, leaking from suture line, small bowel injury, ureter injury, small bowel obstruction, small bowel fistula, among others.  I was very clear to the patient that there are some chances that one the appendix is evaluated after surgery it can come back negative for  any lesion.  If that is the  case and the ovary also looks normal patient will need further evaluation of the area.  Patient refers that she agrees and understand but the structure is causing her anxiety and she wants it out.  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 Laparoscopic appendectomy (82099) 2. Avoid taking aspirin 5 days before surgery  3. Will discuss with Dr. Leonides Schanz in case you need excision of right ovary.  4. Contact us if has any question or concern.   Patient verbalized understanding, all questions were answered, and were agreeable with the plan outlined above.   This was a 60 minutes encounter, most of the time counseling patient, oriented about different scenarios, and coordinating plan of care.  Herbert Pun, MD  Electronically signed by Herbert Pun, MD

## 2018-10-27 ENCOUNTER — Encounter
Admission: RE | Admit: 2018-10-27 | Discharge: 2018-10-27 | Disposition: A | Discharge status: Home / Self Care | Payer: Medicare Other | Source: Ambulatory Visit | Attending: General Surgery | Admitting: General Surgery

## 2018-10-27 ENCOUNTER — Other Ambulatory Visit: Payer: Self-pay

## 2018-10-27 ENCOUNTER — Other Ambulatory Visit: Payer: Self-pay | Admitting: Internal Medicine

## 2018-10-27 NOTE — Patient Instructions (Signed)
Your procedure is scheduled on: 10/29/18 Report to Day Surgery. [facility]OND FLOOR To find out your arrival time please call [phone] between 1PM - 3PM on 10/28/18.  Remember: Instructions that are not followed completely may result in serious medical risk,  up to and including death, or upon the discretion of your surgeon and anesthesiologist your  surgery may need to be rescheduled.     _X__ 1. Do not eat food after midnight the night before your procedure.                 No gum chewing or hard candies. You may drink clear liquids up to 2 hours                 before you are scheduled to arrive for your surgery- DO not drink clear                 liquids within 2 hours of the start of your surgery.                 Clear Liquids include:  water, apple juice without pulp, clear carbohydrate                 drink such as Clearfast of Gatorade, Black Coffee or Tea (Do not add                 anything to coffee or tea).  __X__2.  On the morning of surgery brush your teeth with toothpaste and water, you                may rinse your mouth with mouthwash if you wish.  Do not swallow any toothpaste of mouthwash.     _X__ 3.  No Alcohol for 24 hours before or after surgery.   _X__ 4.  Do Not Smoke or use e-cigarettes For 24 Hours Prior to Your Surgery.                 Do not use any chewable tobacco products for at least 6 hours prior to                 surgery.  ____  5.  Bring all medications with you on the day of surgery if instructed.   _X__  6.  Notify your doctor if there is any change in your medical condition      (cold, fever, infections).     Do not wear jewelry, make-up, hairpins, clips or nail polish. Do not wear lotions, powders, or perfumes. You may wear deodorant. Do not shave 48 hours prior to surgery. Men may shave face and neck. Do not bring valuables to the hospital.    [facility] is not responsible for any belongings or  valuables.  Contacts, dentures or bridgework may not be worn into surgery. Leave your suitcase in the car. After surgery it may be brought to your room. For patients admitted to the hospital, discharge time is determined by your treatment team.   Patients discharged the day of surgery will not be allowed to drive home.   Please read over the following fact sheets that you were given:   Surgical Site Infection Prevention          ____ Take these medicines the morning of surgery with A SIP OF WATER:    1. DULOXETINE  2. ACIPHEX AT BEDTIME 10/28/18 AND AM OF SURGERY  3.   4.  5.  6.  ____ Fleet Enema (as directed)   _X___ Use CHG Soap as directed  ____ Use inhalers on the day of surgery  ____ Stop metformin 2 days prior to surgery    ____ Take 1/2 of usual insulin dose the night before surgery. No insulin the morning          of surgery.   ____ Stop Coumadin/Plavix/aspirin on   ____ Stop Anti-inflammatories on    ____ Stop supplements until after surgery.    ____ Bring C-Pap to the hospital.

## 2018-10-28 ENCOUNTER — Encounter
Admission: RE | Admit: 2018-10-28 | Discharge: 2018-10-28 | Disposition: A | Discharge status: Home / Self Care | Payer: Medicare Other | Source: Ambulatory Visit | Attending: General Surgery | Admitting: General Surgery

## 2018-10-28 DIAGNOSIS — Z888 Allergy status to other drugs, medicaments and biological substances status: Secondary | ICD-10-CM | POA: Diagnosis not present

## 2018-10-28 DIAGNOSIS — K219 Gastro-esophageal reflux disease without esophagitis: Secondary | ICD-10-CM | POA: Diagnosis not present

## 2018-10-28 DIAGNOSIS — Z882 Allergy status to sulfonamides status: Secondary | ICD-10-CM | POA: Diagnosis not present

## 2018-10-28 DIAGNOSIS — R1013 Epigastric pain: Secondary | ICD-10-CM | POA: Diagnosis not present

## 2018-10-28 DIAGNOSIS — Z01810 Encounter for preprocedural cardiovascular examination: Secondary | ICD-10-CM | POA: Diagnosis not present

## 2018-10-28 DIAGNOSIS — D373 Neoplasm of uncertain behavior of appendix: Secondary | ICD-10-CM | POA: Diagnosis present

## 2018-10-28 DIAGNOSIS — Z79899 Other long term (current) drug therapy: Secondary | ICD-10-CM | POA: Diagnosis not present

## 2018-10-28 DIAGNOSIS — N838 Other noninflammatory disorders of ovary, fallopian tube and broad ligament: Secondary | ICD-10-CM | POA: Diagnosis not present

## 2018-10-28 DIAGNOSIS — R110 Nausea: Secondary | ICD-10-CM | POA: Diagnosis not present

## 2018-10-28 DIAGNOSIS — F419 Anxiety disorder, unspecified: Secondary | ICD-10-CM | POA: Diagnosis not present

## 2018-10-28 DIAGNOSIS — F329 Major depressive disorder, single episode, unspecified: Secondary | ICD-10-CM | POA: Diagnosis not present

## 2018-10-28 DIAGNOSIS — C181 Malignant neoplasm of appendix: Secondary | ICD-10-CM | POA: Diagnosis not present

## 2018-10-28 DIAGNOSIS — Z85828 Personal history of other malignant neoplasm of skin: Secondary | ICD-10-CM | POA: Diagnosis not present

## 2018-10-28 DIAGNOSIS — K660 Peritoneal adhesions (postprocedural) (postinfection): Secondary | ICD-10-CM | POA: Diagnosis not present

## 2018-10-28 DIAGNOSIS — Z7951 Long term (current) use of inhaled steroids: Secondary | ICD-10-CM | POA: Diagnosis not present

## 2018-10-28 DIAGNOSIS — N83209 Unspecified ovarian cyst, unspecified side: Secondary | ICD-10-CM | POA: Diagnosis present

## 2018-10-28 DIAGNOSIS — Z8711 Personal history of peptic ulcer disease: Secondary | ICD-10-CM | POA: Diagnosis not present

## 2018-10-28 DIAGNOSIS — G43909 Migraine, unspecified, not intractable, without status migrainosus: Secondary | ICD-10-CM | POA: Diagnosis not present

## 2018-10-28 MED ORDER — CEFAZOLIN SODIUM-DEXTROSE 2-4 GM/100ML-% IV SOLN
2.0000 g | INTRAVENOUS | Status: AC
  Administered 2018-10-29: 2 g via INTRAVENOUS

## 2018-10-28 NOTE — Pre-Procedure Instructions (Signed)
EKG OK BY DR P CARROLL 

## 2018-10-29 ENCOUNTER — Ambulatory Visit
Admission: RE | Admit: 2018-10-29 | Discharge: 2018-10-29 | Disposition: A | Discharge status: Home / Self Care | Payer: Medicare Other | Attending: General Surgery | Admitting: General Surgery

## 2018-10-29 ENCOUNTER — Encounter: Payer: Self-pay | Admitting: *Deleted

## 2018-10-29 ENCOUNTER — Ambulatory Visit: Payer: Medicare Other | Admitting: Anesthesiology

## 2018-10-29 ENCOUNTER — Other Ambulatory Visit: Payer: Self-pay

## 2018-10-29 ENCOUNTER — Encounter
Admission: RE | Disposition: A | Discharge status: Home / Self Care | Payer: Self-pay | Source: Home / Self Care | Attending: General Surgery

## 2018-10-29 DIAGNOSIS — C181 Malignant neoplasm of appendix: Secondary | ICD-10-CM | POA: Diagnosis not present

## 2018-10-29 DIAGNOSIS — G43909 Migraine, unspecified, not intractable, without status migrainosus: Secondary | ICD-10-CM | POA: Insufficient documentation

## 2018-10-29 DIAGNOSIS — D373 Neoplasm of uncertain behavior of appendix: Secondary | ICD-10-CM | POA: Diagnosis not present

## 2018-10-29 DIAGNOSIS — R110 Nausea: Secondary | ICD-10-CM | POA: Diagnosis not present

## 2018-10-29 DIAGNOSIS — Z85828 Personal history of other malignant neoplasm of skin: Secondary | ICD-10-CM | POA: Insufficient documentation

## 2018-10-29 DIAGNOSIS — K660 Peritoneal adhesions (postprocedural) (postinfection): Secondary | ICD-10-CM | POA: Diagnosis not present

## 2018-10-29 DIAGNOSIS — F419 Anxiety disorder, unspecified: Secondary | ICD-10-CM | POA: Diagnosis not present

## 2018-10-29 DIAGNOSIS — Z7951 Long term (current) use of inhaled steroids: Secondary | ICD-10-CM | POA: Insufficient documentation

## 2018-10-29 DIAGNOSIS — Z888 Allergy status to other drugs, medicaments and biological substances status: Secondary | ICD-10-CM | POA: Insufficient documentation

## 2018-10-29 DIAGNOSIS — R1013 Epigastric pain: Secondary | ICD-10-CM | POA: Diagnosis not present

## 2018-10-29 DIAGNOSIS — Z79899 Other long term (current) drug therapy: Secondary | ICD-10-CM | POA: Insufficient documentation

## 2018-10-29 DIAGNOSIS — N838 Other noninflammatory disorders of ovary, fallopian tube and broad ligament: Secondary | ICD-10-CM | POA: Insufficient documentation

## 2018-10-29 DIAGNOSIS — D490 Neoplasm of unspecified behavior of digestive system: Secondary | ICD-10-CM | POA: Diagnosis not present

## 2018-10-29 DIAGNOSIS — F329 Major depressive disorder, single episode, unspecified: Secondary | ICD-10-CM | POA: Insufficient documentation

## 2018-10-29 DIAGNOSIS — K219 Gastro-esophageal reflux disease without esophagitis: Secondary | ICD-10-CM | POA: Insufficient documentation

## 2018-10-29 DIAGNOSIS — Z8711 Personal history of peptic ulcer disease: Secondary | ICD-10-CM | POA: Insufficient documentation

## 2018-10-29 DIAGNOSIS — Z882 Allergy status to sulfonamides status: Secondary | ICD-10-CM | POA: Insufficient documentation

## 2018-10-29 DIAGNOSIS — D121 Benign neoplasm of appendix: Secondary | ICD-10-CM | POA: Diagnosis not present

## 2018-10-29 HISTORY — PX: LAPAROSCOPIC BILATERAL SALPINGO OOPHERECTOMY: SHX5890

## 2018-10-29 HISTORY — PX: LAPAROSCOPIC APPENDECTOMY: SHX408

## 2018-10-29 SURGERY — APPENDECTOMY, LAPAROSCOPIC
Anesthesia: General

## 2018-10-29 MED ORDER — BUPIVACAINE-EPINEPHRINE (PF) 0.25% -1:200000 IJ SOLN
INTRAMUSCULAR | Status: DC | PRN
  Administered 2018-10-29: 25 mL

## 2018-10-29 MED ORDER — SUGAMMADEX SODIUM 200 MG/2ML IV SOLN
INTRAVENOUS | Status: DC | PRN
  Administered 2018-10-29: 107 mg via INTRAVENOUS

## 2018-10-29 MED ORDER — HYDROCODONE-ACETAMINOPHEN 5-325 MG PO TABS: 1.0000 | ORAL_TABLET | ORAL | 0 refills | Status: AC | PRN

## 2018-10-29 MED ORDER — ROCURONIUM BROMIDE 100 MG/10ML IV SOLN
INTRAVENOUS | Status: DC | PRN
  Administered 2018-10-29: 40 mg via INTRAVENOUS

## 2018-10-29 MED ORDER — LIDOCAINE HCL (CARDIAC) PF 100 MG/5ML IV SOSY
PREFILLED_SYRINGE | INTRAVENOUS | Status: DC | PRN
  Administered 2018-10-29: 60 mg via INTRAVENOUS

## 2018-10-29 MED ORDER — FENTANYL CITRATE (PF) 100 MCG/2ML IJ SOLN
INTRAMUSCULAR | Status: DC | PRN
  Administered 2018-10-29 (×2): 50 ug via INTRAVENOUS

## 2018-10-29 MED ORDER — PROMETHAZINE HCL 25 MG/ML IJ SOLN: 6.2500 mg | INTRAMUSCULAR | Status: DC | PRN

## 2018-10-29 MED ORDER — FENTANYL CITRATE (PF) 100 MCG/2ML IJ SOLN
25.0000 ug | INTRAMUSCULAR | Status: DC | PRN
  Administered 2018-10-29: 50 ug via INTRAVENOUS
  Administered 2018-10-29: 25 ug via INTRAVENOUS

## 2018-10-29 MED ORDER — ONDANSETRON HCL 4 MG/2ML IJ SOLN
INTRAMUSCULAR | Status: DC | PRN
  Administered 2018-10-29: 4 mg via INTRAVENOUS

## 2018-10-29 MED ORDER — FENTANYL CITRATE (PF) 100 MCG/2ML IJ SOLN
INTRAMUSCULAR | Status: AC
  Filled 2018-10-29: qty 2

## 2018-10-29 MED ORDER — HYDROCODONE-ACETAMINOPHEN 5-325 MG PO TABS
1.0000 | ORAL_TABLET | Freq: Once | ORAL | Status: AC
  Administered 2018-10-29: 1 via ORAL

## 2018-10-29 MED ORDER — BUPIVACAINE-EPINEPHRINE (PF) 0.25% -1:200000 IJ SOLN
INTRAMUSCULAR | Status: AC
  Filled 2018-10-29: qty 30

## 2018-10-29 MED ORDER — LACTATED RINGERS IV SOLN
INTRAVENOUS | Status: DC
  Administered 2018-10-29: 09:00:00 via INTRAVENOUS

## 2018-10-29 MED ORDER — PROPOFOL 10 MG/ML IV BOLUS
INTRAVENOUS | Status: AC
  Filled 2018-10-29: qty 40

## 2018-10-29 MED ORDER — CEFAZOLIN SODIUM-DEXTROSE 2-4 GM/100ML-% IV SOLN
INTRAVENOUS | Status: AC
  Filled 2018-10-29: qty 100

## 2018-10-29 MED ORDER — FENTANYL CITRATE (PF) 100 MCG/2ML IJ SOLN
INTRAMUSCULAR | Status: AC
  Administered 2018-10-29: 50 ug via INTRAVENOUS
  Filled 2018-10-29: qty 2

## 2018-10-29 MED ORDER — PROPOFOL 10 MG/ML IV BOLUS
INTRAVENOUS | Status: DC | PRN
  Administered 2018-10-29: 90 mg via INTRAVENOUS

## 2018-10-29 MED ORDER — MEPERIDINE HCL 50 MG/ML IJ SOLN: 6.2500 mg | INTRAMUSCULAR | Status: DC | PRN

## 2018-10-29 MED ORDER — HYDROCODONE-ACETAMINOPHEN 5-325 MG PO TABS
ORAL_TABLET | ORAL | Status: AC
  Administered 2018-10-29: 1 via ORAL
  Filled 2018-10-29: qty 1

## 2018-10-29 MED ORDER — OXYCODONE HCL 5 MG/5ML PO SOLN: 5.0000 mg | Freq: Once | ORAL | Status: DC | PRN

## 2018-10-29 MED ORDER — DEXAMETHASONE SODIUM PHOSPHATE 10 MG/ML IJ SOLN
INTRAMUSCULAR | Status: DC | PRN
  Administered 2018-10-29: 4 mg via INTRAVENOUS

## 2018-10-29 MED ORDER — OXYCODONE HCL 5 MG PO TABS: 5.0000 mg | ORAL_TABLET | Freq: Once | ORAL | Status: DC | PRN

## 2018-10-29 SURGICAL SUPPLY — 57 items
APPLIER CLIP LOGIC TI 5 (MISCELLANEOUS) IMPLANT
BLADE SURG SZ11 CARB STEEL (BLADE) ×3 IMPLANT
CANISTER SUCT 1200ML W/VALVE (MISCELLANEOUS) ×3 IMPLANT
CHLORAPREP W/TINT 26ML (MISCELLANEOUS) ×3 IMPLANT
COVER WAND RF STERILE (DRAPES) IMPLANT
CUTTER FLEX LINEAR 45M (STAPLE) ×3 IMPLANT
DERMABOND ADVANCED (GAUZE/BANDAGES/DRESSINGS) ×1
DERMABOND ADVANCED .7 DNX12 (GAUZE/BANDAGES/DRESSINGS) ×2 IMPLANT
DRAPE LEGGINS SURG 28X43 STRL (DRAPES) IMPLANT
DRAPE UNDER BUTTOCK W/FLU (DRAPES) IMPLANT
DRSG TELFA 4X3 1S NADH ST (GAUZE/BANDAGES/DRESSINGS) IMPLANT
ELECT REM PT RETURN 9FT ADLT (ELECTROSURGICAL) ×3
ELECTRODE REM PT RTRN 9FT ADLT (ELECTROSURGICAL) ×2 IMPLANT
GLOVE BIO SURGEON STRL SZ 6.5 (GLOVE) ×3 IMPLANT
GLOVE PI ORTHOPRO 6.5 (GLOVE) ×1
GLOVE PI ORTHOPRO STRL 6.5 (GLOVE) ×2 IMPLANT
GLOVE SURG SYN 6.5 ES PF (GLOVE) ×3 IMPLANT
GOWN STRL REUS W/ TWL LRG LVL3 (GOWN DISPOSABLE) ×12 IMPLANT
GOWN STRL REUS W/TWL LRG LVL3 (GOWN DISPOSABLE) ×6
GRASPER SUT TROCAR 14GX15 (MISCELLANEOUS) ×3 IMPLANT
HANDLE YANKAUER SUCT BULB TIP (MISCELLANEOUS) IMPLANT
IRRIGATION STRYKERFLOW (MISCELLANEOUS) ×2 IMPLANT
IRRIGATOR STRYKERFLOW (MISCELLANEOUS) ×3
IV LACTATED RINGERS 1000ML (IV SOLUTION) IMPLANT
IV NS 1000ML (IV SOLUTION) ×1
IV NS 1000ML BAXH (IV SOLUTION) ×2 IMPLANT
KIT PINK PAD W/HEAD ARE REST (MISCELLANEOUS) ×3
KIT PINK PAD W/HEAD ARM REST (MISCELLANEOUS) ×2 IMPLANT
KIT TURNOVER KIT A (KITS) ×3 IMPLANT
LABEL OR SOLS (LABEL) IMPLANT
LIGASURE LAP MARYLAND 5MM 37CM (ELECTROSURGICAL) ×3 IMPLANT
LIGASURE VESSEL 5MM BLUNT TIP (ELECTROSURGICAL) IMPLANT
NEEDLE HYPO 22GX1.5 SAFETY (NEEDLE) ×3 IMPLANT
NEEDLE VERESS 14GA 120MM (NEEDLE) ×3 IMPLANT
NS IRRIG 500ML POUR BTL (IV SOLUTION) ×6 IMPLANT
PACK LAP CHOLECYSTECTOMY (MISCELLANEOUS) ×3 IMPLANT
PAD PREP 24X41 OB/GYN DISP (PERSONAL CARE ITEMS) IMPLANT
POUCH ENDO CATCH 10MM SPEC (MISCELLANEOUS) ×6 IMPLANT
POUCH SPECIMEN RETRIEVAL 10MM (ENDOMECHANICALS) IMPLANT
RELOAD 45 VASCULAR/THIN (ENDOMECHANICALS) IMPLANT
RELOAD STAPLE TA45 3.5 REG BLU (ENDOMECHANICALS) ×3 IMPLANT
SCISSORS METZENBAUM CVD 33 (INSTRUMENTS) ×3 IMPLANT
SET TUBE SMOKE EVAC HIGH FLOW (TUBING) IMPLANT
SLEEVE ENDOPATH XCEL 5M (ENDOMECHANICALS) ×6 IMPLANT
SPONGE GAUZE 2X2 8PLY STRL LF (GAUZE/BANDAGES/DRESSINGS) IMPLANT
STRAP SAFETY 5IN WIDE (MISCELLANEOUS) IMPLANT
SUT MNCRL 4-0 (SUTURE)
SUT MNCRL 4-0 27XMFL (SUTURE)
SUT MNCRL AB 4-0 PS2 18 (SUTURE) ×3 IMPLANT
SUT VIC AB 0 CT1 36 (SUTURE) IMPLANT
SUT VICRYL PLUS ABS 0 54 (SUTURE) ×3 IMPLANT
SUTURE MNCRL 4-0 27XMF (SUTURE) IMPLANT
SYR 10ML LL (SYRINGE) IMPLANT
TRAY FOLEY MTR SLVR 16FR STAT (SET/KITS/TRAYS/PACK) ×3 IMPLANT
TROCAR ENDO BLADELESS 11MM (ENDOMECHANICALS) IMPLANT
TROCAR XCEL 12X100 BLDLESS (ENDOMECHANICALS) ×3 IMPLANT
TROCAR XCEL NON-BLD 5MMX100MML (ENDOMECHANICALS) ×3 IMPLANT

## 2018-10-29 NOTE — Op Note (Signed)
10/29/2018  PATIENT:  Brandi Ramos  73 y.o. female  PRE-OPERATIVE DIAGNOSIS:  NEOPLASM OF APPENDIX, Ovarian cyst  POST-OPERATIVE DIAGNOSIS:  Right Fallopian tube cyst  PROCEDURE:  Procedure(s): APPENDECTOMY LAPAROSCOPIC (N/A) LAPAROSCOPIC BILATERAL SALPINGO OOPHORECTOMY (Bilateral)  SURGEON:  Surgeon(s) and Role: Panel 1:    * Herbert Pun, MD - Primary Panel 2:    * , Honor Loh, MD - Primary  ANESTHESIA: GET  EBL:  Total I/O In: 800 [I.V.:800] Out: 85 [Urine:75; Blood:10]  DRAINS: foley to gravity (removed postop)  FINDINGS: multiple adhesions of small bowel to the pelvic tissues.  retroperitonealized right ovary. Right fallopian tube beneath a different dilated tubular structure, which could also be part of the right fallopian tube.  All of these structures were adherent to the appendix. Sigmoid colon adherent to left pelvic sidewall, but left tube and ovary able to be visualized well.   SPECIMEN: Bilateral fallopian tubes, bilateral ovaries, and right pelvic cystic structure  DISPOSITION OF SPECIMEN:  To pathology  COUNTS: correct x2  COMPLICATIONS: none apparent  PATIENT DISPOSITION:  VS stable to PACU   Indication for surgery: Patient has had a stable pelvic neoplasm for 9 years.  She has had no symptoms.  She was seen by GI who recommended removal of what was either an appendix mucocele or an ovarian process per radiology. She consented to have her ovaries and tubes removed (history of vaginal hysterectomy) in addition to her appendix.  Dr. Windell Moment asked me to participate in this surgery to remove her tubes and ovaries.    Procedure: The patient was brought to the OR and identified as Brandi Ramos.  She was given general anesthesia via endotracheal route, positioned supine, a foley was placed, and she was prepped and draped in the usual sterile fashion.  A surgical time-out was called. A periumbilical incision was made, a veress needle was  placed into the abdominal cavity, the opening pressure was 31mmHg. Pneumoperitoneum was created to 64mmHg.  The veress was removed and using the visiport method, the 38mm trochar was inserted.  Brief survey of the abdomen was performed and appeared normal.  A 20mm trochar was placed suprapubic area  an 62mm port in the LLQ, and a 72mm port in the RLQ, all under visualization.   The adhesions of the small bowel to the pelvic tissues were taken down with cold scissors.  Once the appendix and its adhesions were identified, it was noted that the tubular structure and the fallopian tube were able to be mobilized but the right ovary was not visible.  The peritoneum was entered bluntly and the Ligasure was then used to dissect the peritoneum along the pelvic side wall to isolate the now-visible ovary and the IP ligament.  The IP ligament was skeletonized and thrice cauterized and transected with the Ligasure, and the attaching peritoneum undermined and divided.   The left tube and ovary were isolated from the sigmoid adhesions and the ligasure was again used to cauterize and transect the IP ligament and underlying peritoneal attachments.  There was no heat damage to the surrounding bowel.   Due to the adhesions neither ureter was able to be visualized, but the isolation of structures prohibited the inclusion of ureter in the surgical field.   The specimens were placed into laparoscopic bags and the remainder of the case was completed by Dr. Windell Moment.    The patient tolerated the procedure, the sponge, needle, and instrument counts were correct x2, and the patient was brought  to PACU extubated, and in a stable condition.  I was present for, and performed this portion of the procedure in its entirety.  ----- Larey Days, MD Attending Obstetrician and Gynecologist [facility], Department of [facility]

## 2018-10-29 NOTE — Anesthesia Postprocedure Evaluation (Signed)
Anesthesia Post Note  Patient: Brandi Ramos  Procedure(s) Performed: APPENDECTOMY LAPAROSCOPIC (N/A ) LAPAROSCOPIC BILATERAL SALPINGO OOPHORECTOMY (Bilateral )  Patient location during evaluation: PACU Anesthesia Type: General Level of consciousness: awake and alert and oriented Pain management: pain level controlled Vital Signs Assessment: post-procedure vital signs reviewed and stable Respiratory status: spontaneous breathing, nonlabored ventilation and respiratory function stable Cardiovascular status: blood pressure returned to baseline and stable Postop Assessment: no signs of nausea or vomiting Anesthetic complications: no     Last Vitals:  Vitals:   10/29/18 1339 10/29/18 1413  BP: (!) 147/61 138/65  Pulse: 60 65  Resp: 18   Temp: 36.6 C   SpO2: 100% 100%    Last Pain:  Vitals:   10/29/18 1413  TempSrc:   PainSc: 3                  Graison Leinberger

## 2018-10-29 NOTE — Anesthesia Procedure Notes (Signed)
Procedure Name: Intubation Date/Time: 10/29/2018 10:36 AM Performed by: Chanetta Marshall, CRNA Pre-anesthesia Checklist: Patient identified, Emergency Drugs available, Suction available and Patient being monitored Patient Re-evaluated:Patient Re-evaluated prior to induction Oxygen Delivery Method: Circle system utilized Preoxygenation: Pre-oxygenation with 100% oxygen Induction Type: IV induction Ventilation: Mask ventilation without difficulty Laryngoscope Size: Mac and 3 Grade View: Grade II Tube size: 7.0 mm Number of attempts: 1 Airway Equipment and Method: Stylet Placement Confirmation: ETT inserted through vocal cords under direct vision,  positive ETCO2,  breath sounds checked- equal and bilateral and CO2 detector Secured at: 20 cm Tube secured with: Tape Dental Injury: Teeth and Oropharynx as per pre-operative assessment

## 2018-10-29 NOTE — Anesthesia Post-op Follow-up Note (Signed)
Anesthesia QCDR form completed.        

## 2018-10-29 NOTE — Discharge Instructions (Signed)
AMBULATORY SURGERY  DISCHARGE INSTRUCTIONS   1) The drugs that you were given will stay in your system until tomorrow so for the next 24 hours you should not:  A) Drive an automobile B) Make any legal decisions C) Drink any alcoholic beverage   2) You may resume regular meals tomorrow.  Today it is better to start with liquids and gradually work up to solid foods.  You may eat anything you prefer, but it is better to start with liquids, then soup and crackers, and gradually work up to solid foods.   3) Please notify your doctor immediately if you have any unusual bleeding, trouble breathing, redness and pain at the surgery site, drainage, fever, or pain not relieved by medication.    4) Additional Instructions:        Please contact your physician with any problems or Same Day Surgery at [phone], Monday through Friday 6 am to 4 pm, or [place] [facility] Main number at [phone]. Diet: Resume home heart healthy regular diet.   Activity: No heavy lifting >20 pounds (children, pets, laundry, garbage) or strenuous activity until follow-up, but light activity and walking are encouraged. Do not drive or drink alcohol if taking narcotic pain medications.  Wound care: May shower with soapy water and pat dry (do not rub incisions), but no baths or submerging incision underwater until follow-up. (no swimming)   Medications: Resume all home medications. For mild to moderate pain: acetaminophen (Tylenol) or ibuprofen (if no kidney disease). Combining Tylenol with alcohol can substantially increase your risk of causing liver disease. Narcotic pain medications, if prescribed, can be used for severe pain, though may cause nausea, constipation, and drowsiness. Do not combine Tylenol and Norco within a 6 hour period as Norco contains Tylenol. If you do not need the narcotic pain medication, you do not need to fill the prescription.  Call office ([phone]) at any time if any  questions, worsening pain, fevers/chills, bleeding, drainage from incision site, or other concerns.  

## 2018-10-29 NOTE — Transfer of Care (Signed)
Immediate Anesthesia Transfer of Care Note  Patient: Brandi Ramos  Procedure(s) Performed: APPENDECTOMY LAPAROSCOPIC (N/A ) LAPAROSCOPIC BILATERAL SALPINGO OOPHORECTOMY (Bilateral )  Patient Location: PACU  Anesthesia Type:General  Level of Consciousness: awake, alert  and oriented  Airway & Oxygen Therapy: Patient Spontanous Breathing and Patient connected to nasal cannula oxygen  Post-op Assessment: Report given to RN and Post -op Vital signs reviewed and stable  Post vital signs: Reviewed and stable  Last Vitals:  Vitals Value Taken Time  BP    Temp    Pulse    Resp    SpO2      Last Pain:  Vitals:   10/29/18 0858  TempSrc: Tympanic  PainSc: 0-No pain      Patients Stated Pain Goal: 0 (47/07/61 5183)  Complications: No apparent anesthesia complications

## 2018-10-29 NOTE — Interval H&P Note (Signed)
History and Physical Interval Note:  10/29/2018 10:30 AM  Brandi Ramos  has presented today for surgery, with the diagnosis of NEOPLASM OF APPENDIX  The various methods of treatment have been discussed with the patient and family. After consideration of risks, benefits and other options for treatment, the patient has consented to  Procedure(s): APPENDECTOMY LAPAROSCOPIC (N/A) LAPAROSCOPIC BILATERAL SALPINGO OOPHORECTOMY(POSSIBLE) (Bilateral) as a surgical intervention .  The patient's history has been reviewed, patient examined, no change in status, stable for surgery.  I have reviewed the patient's chart and labs.  Questions were answered to the patient's satisfaction.    I spoke to patient and her family prior to surgery and she requested just removing her ovaries no matter what, if possible, and tubes if they are there.  The consent was updated to say BILATERAL oophorectomy.  And was signed by the both of [place].   [place]

## 2018-10-29 NOTE — Anesthesia Preprocedure Evaluation (Signed)
Anesthesia Evaluation  Patient identified by MRN, date of birth, ID band Patient awake    Reviewed: Allergy & Precautions, NPO status , Patient's Chart, lab work & pertinent test results  History of Anesthesia Complications Negative for: history of anesthetic complications  Airway Mallampati: III  TM Distance: >3 FB Neck ROM: Full    Dental no notable dental hx.    Pulmonary neg pulmonary ROS, neg sleep apnea, neg COPD,    breath sounds clear to auscultation- rhonchi (-) wheezing      Cardiovascular Exercise Tolerance: Good (-) hypertension(-) CAD, (-) Past MI, (-) Cardiac Stents and (-) CABG  Rhythm:Regular Rate:Normal - Systolic murmurs and - Diastolic murmurs    Neuro/Psych  Headaches, PSYCHIATRIC DISORDERS Anxiety Depression    GI/Hepatic Neg liver ROS, PUD, GERD  ,  Endo/Other  negative endocrine ROSneg diabetes  Renal/GU negative Renal ROS     Musculoskeletal  (+) Arthritis ,   Abdominal (+) - obese,   Peds  Hematology negative hematology ROS (+)   Anesthesia Other Findings Past Medical History: No date: Anxiety No date: Arthritis No date: Chicken pox No date: Depression No date: Diverticulosis No date: Fibrocystic breast disease No date: Frozen shoulder No date: Fundic gland polyps of stomach, benign No date: GERD (gastroesophageal reflux disease) No date: History of diverticulitis of colon No date: Migraine headache No date: Neutropenia (HCC)     Comment:  worked up - Dr Oliva Bustard, slightly elevated ANA No date: PUD (peptic ulcer disease) No date: Recurrent vaginitis No date: Seasonal allergies No date: Skin cancer, 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    Reproductive/Obstetrics                             Anesthesia Physical Anesthesia Plan  ASA: II  Anesthesia Plan: General   Post-op Pain Management:    Induction: Intravenous  PONV Risk  Score and Plan: 2 and Ondansetron, Dexamethasone and Midazolam  Airway Management Planned: Oral ETT  Additional Equipment:   Intra-op Plan:   Post-operative Plan: Extubation in OR  Informed Consent: I have reviewed the patients History and Physical, chart, labs and discussed the procedure including the risks, benefits and alternatives for the proposed anesthesia with the patient or authorized representative who has indicated his/her understanding and acceptance.     Dental advisory given  Plan Discussed with: CRNA and Anesthesiologist  Anesthesia Plan Comments:         Anesthesia Quick Evaluation

## 2018-10-29 NOTE — Op Note (Signed)
Preoperative diagnosis: Appendiceal mass.  Postoperative diagnosis: Appendiceal mass  Procedure: Laparoscopic appendectomy.  Anesthesia: GETA  Surgeon: Dr. Windell Moment, MD  Wound Classification: Clean Contaminated.   Indications: Patient is a 73 y.o. female  presented with nonspecific abdominal pain of years of duration. Computed tomography scan has shown a persistent tubular lesion on the right lower quadrant that was concerning of appendiceal mucocele versus an ovarian lesion.  Findings: 1.  The appendix and the right ovary were adhered on the right upper pelvis. 2.  A tubular cystic lesion was identified between the 2 abdomen.  It was not continues with the ovary or the appendix. 3. Normal anatomy 4.  Bilateral oophorectomy done by Dr. Leonides Schanz 5.  No other lesions identified on the peritoneum, omentum or liver  Description of procedure: The patient was placed on the operating table in the supine position. General anesthesia was induced. A time-out was completed verifying correct patient, procedure, site, positioning, and implant(s) and/or special equipment prior to beginning this procedure. A Foley catheter and orogastric tubes were placed. The abdomen was prepped and draped in the usual sterile fashion.  An incision was made in a natural skin line above the umbilicus.   The fascia was elevated and the Veress needle inserted. Proper position was confirmed by aspiration and saline meniscus test. The abdomen was insufflated with carbon dioxide to a pressure of 15 mmHg. The patient tolerated insufflation well. A 5-mm optiview trocar was then inserted supraumbilically. The laparoscope was inserted and the abdomen inspected. No injuries from initial trocar placement were noted.  A 12 mm trocar was inserted into the left lower quadrant.  An additional 5 mm trocar was placed on the right lower quadrant. A 5-mm port was then placed above the symphysis pubis on midline.  Care was taken to avoid  injury to the bladder or inferior epigastric vessels. The table was placed in the Trendelenburg position.  There were multiple adhesions on the right lower quadrant and pelvis.  Small bowel loops adhesions and cecum were mobilized.  The appendix was identified going down from the right lower current to the pelvis and adhered to the right ovary.  Those adhesions were taken down and separated the appendix from the ovary.  Gynecology service performed bilateral oophorectomy.  The cystic lesion was taken down with the right ovary.  The cecum was gently grasped with an endoscopic graspers and pulled toward (the left upper quadrant). An atraumatic grasper was then passed through the suprapubic port and omentum was dissected away until the appendix was identified. The appendix was then grasped and elevated.   An endoscopic linear cutting stapler was then used to divide and staple the base of the appendix.  The mesoappendix was divided with LigaSure device.  This was found to be a very small appendix.   The appendiceal stump was then irrigated and hemostasis was assured. Fluid was suctioned and no other pathology was identified.  Secondary trocars were removed under direct vision. No bleeding was noted. The laparoscope was withdrawn and the umbilical trocar removed. The abdomen was allowed to collapse. All trocar sites greater than 5 mm were closed with Vicryl 0. The skin was closed with subcuticular sutures Monocryl 4-0 of and Dermabond.  The patient tolerated the procedure well and was taken to the postanesthesia care unit in satisfactory condition.   Specimen: Appendix (Right ovary and fallopian tube, left ovary fallopian tube taken from gynecology service)  Complications: None  Estimated Blood Loss: 50mL

## 2018-10-29 NOTE — Interval H&P Note (Signed)
History and Physical Interval Note:  10/29/2018 8:51 AM  Brandi Ramos  has presented today for surgery, with the diagnosis of NEOPLASM OF APPENDIX  The various methods of treatment have been discussed with the patient and family. After consideration of risks, benefits and other options for treatment, the patient has consented to  Procedure(s): APPENDECTOMY LAPAROSCOPIC (N/A) LAPAROSCOPIC BILATERAL SALPINGO OOPHORECTOMY(POSSIBLE) (Bilateral) as a surgical intervention .  The patient's history has been reviewed, patient examined, no change in status, stable for surgery.  I have reviewed the patient's chart and labs.  Patient again oriented that if the appendix looks normal, Dr. Leonides Schanz will evaluate the right ovary and will possibly perform a right oophorectomy. Patient agreed. Questions were answered to the patient's satisfaction.     Herbert Pun

## 2018-10-31 ENCOUNTER — Other Ambulatory Visit: Payer: Self-pay | Admitting: Anatomic Pathology & Clinical Pathology

## 2018-10-31 ENCOUNTER — Telehealth: Payer: Self-pay | Admitting: Obstetrics & Gynecology

## 2018-10-31 LAB — SURGICAL PATHOLOGY

## 2018-11-06 ENCOUNTER — Other Ambulatory Visit: Payer: Medicare Other

## 2018-11-06 NOTE — Progress Notes (Signed)
Tumor Board Documentation  Brandi Ramos was presented by Dr Ferrel Logan at our Tumor Board on 11/06/2018, which included representatives from medical oncology, radiation oncology, surgical oncology, surgical, radiology, pathology, navigation, internal medicine, pharmacy, genetics, research, palliative care, pulmonology.  Brandi Ramos currently presents as an external consult, for [place], for discussion, for new positive pathology with history of the following treatments: surgical intervention(s).  Additionally, we reviewed previous medical and familial history, history of present illness, and recent lab results along with all available histopathologic and imaging studies. The tumor board considered available treatment options and made the following recommendations:   No follow up needed  The following procedures/referrals were also placed: No orders of the defined types were placed in this encounter.   Clinical Trial Status: not discussed   Staging used: AJCC Stage Group  AJCC Staging: T: T3     Group: Low grade Adpendix mucinous Neoplasm  National site-specific guidelines NCCN were discussed with respect to the case.  Tumor board is a meeting of clinicians from various specialty areas who evaluate and discuss patients for whom a multidisciplinary approach is being considered. Final determinations in the plan of care are those of the provider(s). The responsibility for follow up of recommendations given during tumor board is that of the provider.   Today's extended care, comprehensive team conference, Brandi Ramos was not present for the discussion and was not examined.   Multidisciplinary Tumor Board is a multidisciplinary case peer review process.  Decisions discussed in the Multidisciplinary Tumor Board reflect the opinions of the specialists present at the conference without having examined the patient.  Ultimately, treatment and diagnostic decisions rest with the primary provider(s) and the patient.

## 2018-11-20 DIAGNOSIS — G5762 Lesion of plantar nerve, left lower limb: Secondary | ICD-10-CM | POA: Diagnosis not present

## 2018-12-04 ENCOUNTER — Other Ambulatory Visit: Payer: Self-pay | Admitting: Internal Medicine

## 2018-12-04 NOTE — Telephone Encounter (Signed)
Requested medication (s) are due for refill today: Yes  Requested medication (s) are on the active medication list: Yes  Last refill:  10/03/18  Future visit scheduled: Yes  Notes to clinic:  Unable to refill, cannot delegate     Requested Prescriptions  Pending Prescriptions Disp Refills   ALPRAZolam (XANAX) 0.25 MG tablet 30 tablet 1    Sig: TAKE 1 TABLET BY MOUTH EVERY DAY AS NEEDED     Not Delegated - Psychiatry:  Anxiolytics/Hypnotics Failed - 12/04/2018 11:54 AM      Failed - This refill cannot be delegated      Failed - Urine Drug Screen completed in last 360 days.      Passed - Valid encounter within last 6 months    Recent Outpatient Visits          3 months ago Need for 23-polyvalent pneumococcal polysaccharide vaccine   [facility] [place], Randell Patient, MD   7 months ago [place], Randell Patient, MD   10 months ago Ear lesion   [facility] Einar Pheasant, MD   1 year ago Dysuria   [facility] [place], Randell Patient, MD   1 year ago Encounter for immunization   [facility] Einar Pheasant, MD      Future Appointments            In 3 weeks Einar Pheasant, MD [place], [place]   In 2 months [place], Gordan Payment [facility]

## 2018-12-05 NOTE — Telephone Encounter (Signed)
Pt called in to follow up on refill request. Pt says that she is completely out of her medication. Advised that Rx is awaiting Dr approval.   Pt expressed understanding.

## 2018-12-08 ENCOUNTER — Telehealth: Payer: Self-pay

## 2018-12-08 MED ORDER — ALPRAZOLAM 0.25 MG PO TABS: ORAL_TABLET | ORAL | 1 refills | Status: DC

## 2018-12-08 NOTE — Telephone Encounter (Signed)
rx ok'd for xanax #30 with 1 refill.  Please call pt and let her know that I just received message.  Sorry for the inconvenience.

## 2018-12-08 NOTE — Telephone Encounter (Signed)
Just received.  See phone message.  rx ok'd and sent in for xanax #30 with 1 refill

## 2018-12-08 NOTE — Telephone Encounter (Signed)
Refill request for Xanax, last seen 09-07-18, last filled 10-03-18 .  Please advise.

## 2018-12-08 NOTE — Telephone Encounter (Signed)
Patient is aware. I apologized for the inconvenience. Patient was very understanding but was not happy about being out of her medication all weekend.

## 2018-12-08 NOTE — Telephone Encounter (Signed)
A fax request came in from [facility] for pt. Patient is requesting Xanax 0.25 mg to be sent to pharmacy.  Laster Appling,cma

## 2018-12-08 NOTE — Telephone Encounter (Signed)
Pt calling this am, and does not understand why this hs not been refilled.  Pt states she has been out all weekend and is very upset.  Pt is afraid she is going to get sick if she does not take this med. Pt been calling since last wed. Pt would like a call back asap.

## 2018-12-08 NOTE — Telephone Encounter (Signed)
Pt aware Rx has been clled in.  Pt states thank you very much. I did apologize to her for any inconvenience. Pt going to pick up now.   Thank you!

## 2018-12-09 DIAGNOSIS — R110 Nausea: Secondary | ICD-10-CM | POA: Diagnosis not present

## 2018-12-09 DIAGNOSIS — K210 Gastro-esophageal reflux disease with esophagitis: Secondary | ICD-10-CM | POA: Diagnosis not present

## 2018-12-26 ENCOUNTER — Encounter: Payer: Self-pay | Admitting: Internal Medicine

## 2018-12-26 ENCOUNTER — Other Ambulatory Visit: Payer: Self-pay

## 2018-12-26 ENCOUNTER — Ambulatory Visit (INDEPENDENT_AMBULATORY_CARE_PROVIDER_SITE_OTHER): Payer: Medicare Other | Admitting: Internal Medicine

## 2018-12-26 DIAGNOSIS — K219 Gastro-esophageal reflux disease without esophagitis: Secondary | ICD-10-CM | POA: Diagnosis not present

## 2018-12-26 DIAGNOSIS — G43709 Chronic migraine without aura, not intractable, without status migrainosus: Secondary | ICD-10-CM | POA: Diagnosis not present

## 2018-12-26 DIAGNOSIS — J329 Chronic sinusitis, unspecified: Secondary | ICD-10-CM

## 2018-12-26 DIAGNOSIS — F419 Anxiety disorder, unspecified: Secondary | ICD-10-CM

## 2018-12-26 MED ORDER — AMOXICILLIN 875 MG PO TABS: 875.0000 mg | ORAL_TABLET | Freq: Two times a day (BID) | ORAL | 0 refills | Status: DC

## 2018-12-26 NOTE — Progress Notes (Addendum)
Patient ID: Brandi Ramos, female   DOB: [birth year], 73 y.o.   MRN: [id number] Virtual Visit via Video:  Note  This visit type was conducted due to national recommendations for restrictions regarding the COVID-19 pandemic (e.g. social distancing).  This format is felt to be most appropriate for this patient at this time.  All issues noted in this document were discussed and addressed.  No physical exam was performed (except for noted visual exam findings with Video Visits).   I connected with Yolanda Bonine on 12/26/18 at 10:00 AM EDT by a video enabled telemedicine application.  Verified that I am speaking with the correct person using two identifiers. Location patient: home Location provider: work Persons participating in the virtual visit: patient, provider  I discussed the limitations, risks, security and privacy concerns of performing an evaluation and management service by video.The patient expressed understanding and agreed to proceed.   Reason for visit: scheduled follow up.    HPI: She reports she has noticed increased head congestion and sinus congestion.  Symptoms have worsened over the past week. Ears are stopped up.  Left side maxillary pressure.  Feels stopped up.  Increased drainage and some hoarseness.  No sore throat.  No vomiting or diarrhea.  Has noticed increased light headedness and dizziness recently.  Was questioning if related to the above.  Just started using flonase and recently started mucinex. Notices some improvement in symptoms.  Dizziness is better.  No fever.  No known COVID exposure.  No sob or chest congestion.  Eating and drinking ok.     ROS: See pertinent positives and negatives per HPI.  Past Medical History:  Diagnosis Date  . Anxiety   . Arthritis   . Chicken pox   . Depression   . Diverticulosis   . Fibrocystic breast disease   . Frozen shoulder   . Fundic gland polyps of stomach, benign   . GERD (gastroesophageal reflux disease)   . History of  diverticulitis of colon   . Migraine headache   . Neutropenia ([place])    worked up - Dr Oliva Bustard, slightly elevated ANA  . PUD (peptic ulcer disease)   . Recurrent vaginitis   . Seasonal allergies   . 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     Past Surgical History:  Procedure Laterality Date  . ABDOMINAL HYSTERECTOMY  1984  . COLONOSCOPY    . COLONOSCOPY WITH PROPOFOL N/A 09/26/2018   Procedure: COLONOSCOPY WITH PROPOFOL;  Surgeon: Lollie Sails, MD;  Location[facility] ENDOSCOPY;  Service: Endoscopy;  Laterality: N/A;  . ESOPHAGOGASTRODUODENOSCOPY (EGD) WITH PROPOFOL N/A 10/31/2015   Procedure: ESOPHAGOGASTRODUODENOSCOPY (EGD) WITH PROPOFOL;  Surgeon: Lollie Sails, MD;  Location: [facility] ENDOSCOPY;  Service: Endoscopy;  Laterality: N/A;  . ESOPHAGOGASTRODUODENOSCOPY (EGD) WITH PROPOFOL N/A 09/26/2018   Procedure: ESOPHAGOGASTRODUODENOSCOPY (EGD) WITH PROPOFOL;  Surgeon: Lollie Sails, MD;  Location: [facility] ENDOSCOPY;  Service: Endoscopy;  Laterality: N/A;  . LAPAROSCOPIC APPENDECTOMY N/A 10/29/2018   Procedure: APPENDECTOMY LAPAROSCOPIC;  Surgeon: Herbert Pun, MD;  Location: [facility] ORS;  Service: General;  Laterality: N/A;  . LAPAROSCOPIC BILATERAL SALPINGO OOPHERECTOMY Bilateral 10/29/2018   Procedure: LAPAROSCOPIC BILATERAL SALPINGO OOPHORECTOMY;  Surgeon: Ward, Honor Loh, MD;  Location: [facility] ORS;  Service: Gynecology;  Laterality: Bilateral;    Family History  Problem Relation Age of Onset  . CAD Mother        s/p CABG  . Hypercholesterolemia Mother   . Breast cancer Mother  11  . Heart disease Mother   . Lung cancer Mother   . Hypertension Father   . Migraines Father   . Hypercholesterolemia Father   . Lung cancer Father   . Migraines Sister   . Arthritis Sister   . Lymphoma Other        grandmother  . Colon cancer Neg Hx     SOCIAL HX: reviewed.    Current Outpatient Medications:  .  AIMOVIG 140 DOSE 70 MG/ML SOAJ, INJ 1 SYRINGE  UTD Q 4 WKS, Disp: , Rfl: 11 .  ALPRAZolam (XANAX) 0.25 MG tablet, TAKE 1 TABLET BY MOUTH EVERY DAY AS NEEDED, Disp: 30 tablet, Rfl: 1 .  amoxicillin (AMOXIL) 875 MG tablet, Take 1 tablet (875 mg total) by mouth 2 (two) times daily., Disp: 20 tablet, Rfl: 0 .  DULoxetine (CYMBALTA) 60 MG capsule, Take 1 capsule (60 mg total) by mouth daily. (Patient taking differently: Take 60 mg by mouth daily. AM), Disp: 90 capsule, Rfl: 0 .  fluticasone (FLONASE) 50 MCG/ACT nasal spray, SHAKE LIQUID AND USE 2 SPRAYS IN EACH NOSTRIL DAILY, Disp: 16 g, Rfl: 11 .  LINZESS 145 MCG CAPS capsule, Take 145 mcg by mouth daily., Disp: , Rfl: 3 .  loratadine (CLARITIN) 10 MG tablet, Take 10 mg by mouth daily., Disp: , Rfl:  .  meloxicam (MOBIC) 15 MG tablet, Take 15 mg by mouth daily., Disp: , Rfl:  .  propranolol ER (INDERAL LA) 80 MG 24 hr capsule, Take 80 mg by mouth at bedtime. , Disp: , Rfl: 11 .  RABEprazole (ACIPHEX) 20 MG tablet, TAKE 1 TABLET DAILY., Disp: 90 tablet, Rfl: 1 .  rizatriptan (MAXALT) 10 MG tablet, Take 10 mg by mouth as needed. May repeat in 2 hours if needed, Disp: , Rfl:  .  sucralfate (CARAFATE) 1 g tablet, Take 1 tablet (1 g total) by mouth 2 (two) times daily as needed. (Patient taking differently: Take 1 g by mouth 2 (two) times daily. ), Disp: 60 tablet, Rfl: 1 .  topiramate (TOPAMAX) 25 MG tablet, TK 1 TO 2 TS PO HS, Disp: , Rfl: 3 .  triamcinolone ointment (KENALOG) 0.5 %, Apply 1 application topically 2 (two) times daily., Disp: 30 g, Rfl: 0  EXAM:   GENERAL: alert, oriented, appears well and in no acute distress  HEENT: atraumatic, conjunttiva clear, no obvious abnormalities on inspection of external nose and ears  NECK: normal movements of the head and neck  LUNGS: on inspection no signs of respiratory distress, breathing rate appears normal, no obvious gross SOB, gasping or wheezing  CV: no obvious cyanosis  PSYCH/NEURO: pleasant and cooperative, no obvious depression or  anxiety, speech and thought processing grossly intact  ASSESSMENT AND PLAN:  Discussed the following assessment and plan:  Anxiety  Chronic migraine without aura without status migrainosus, not intractable  Sinusitis, unspecified chronicity, unspecified location  Gastroesophageal reflux disease, esophagitis presence not specified  Anxiety Some increased stress with her current symptoms, etc.  Continue current medication regimen.  Overall she feels she is handling things relatively well.  Follow.    Migraine headache Followed by neurology.    Sinusitis Symptoms appear to be c/w sinusitis.  Feel her light headedness and dizziness are related to her current sinus issues.  Better with flonase and mucinex.  Continue flonaxe.  Hold claritin.  mucinex as directed.  Amoxicillin and probiotic as directed.  Follow.  Call with update.    GERD (gastroesophageal reflux disease) No upper  symptoms now.      I discussed the assessment and treatment plan with the patient. The patient was provided an opportunity to ask questions and all were answered. The patient agreed with the plan and demonstrated an understanding of the instructions.   The patient was advised to call back or seek an in-person evaluation if the symptoms worsen or if the condition fails to improve as anticipated.    Einar Pheasant, MD

## 2018-12-28 ENCOUNTER — Encounter: Payer: Self-pay | Admitting: Internal Medicine

## 2018-12-28 DIAGNOSIS — J329 Chronic sinusitis, unspecified: Secondary | ICD-10-CM | POA: Insufficient documentation

## 2018-12-28 NOTE — Assessment & Plan Note (Signed)
No upper symptoms now.

## 2018-12-28 NOTE — Assessment & Plan Note (Signed)
Followed by neurology.   

## 2018-12-28 NOTE — Assessment & Plan Note (Signed)
Some increased stress with her current symptoms, etc.  Continue current medication regimen.  Overall she feels she is handling things relatively well.  Follow.

## 2018-12-28 NOTE — Assessment & Plan Note (Signed)
Symptoms appear to be c/w sinusitis.  Feel her light headedness and dizziness are related to her current sinus issues.  Better with flonase and mucinex.  Continue flonaxe.  Hold claritin.  mucinex as directed.  Amoxicillin and probiotic as directed.  Follow.  Call with update.

## 2019-01-15 DIAGNOSIS — G4701 Insomnia due to medical condition: Secondary | ICD-10-CM | POA: Diagnosis not present

## 2019-01-15 DIAGNOSIS — G43719 Chronic migraine without aura, intractable, without status migrainosus: Secondary | ICD-10-CM | POA: Diagnosis not present

## 2019-01-15 DIAGNOSIS — R52 Pain, unspecified: Secondary | ICD-10-CM | POA: Diagnosis not present

## 2019-01-17 ENCOUNTER — Other Ambulatory Visit: Payer: Self-pay | Admitting: Internal Medicine

## 2019-02-03 ENCOUNTER — Other Ambulatory Visit: Payer: Self-pay | Admitting: Internal Medicine

## 2019-02-03 MED ORDER — ALPRAZOLAM 0.25 MG PO TABS: ORAL_TABLET | ORAL | 1 refills | Status: DC

## 2019-02-03 NOTE — Telephone Encounter (Signed)
Last OV 12/26/2018 Next OV 03/30/2019 Last refill 12/08/2018

## 2019-02-03 NOTE — Telephone Encounter (Signed)
Copied from [place] [id number]. Topic: Quick Communication - Rx Refill/Question >> Feb 03, 2019  9:56 AM Ahmed Prima L wrote: Medication:  ALPRAZolam Duanne Moron) 0.25 MG tablet    Has the patient contacted their pharmacy? yes (Agent: If no, request that the patient contact the pharmacy for the refill.) (Agent: If yes, when and what did the pharmacy advise?)  Preferred Pharmacy (with phone number or street name):[facility] [place], [place] [address] [place] [address] Phone: [phone] Fax: [phone]    Agent: Please be advised that RX refills may take up to 3 business days. We ask that you follow-up with your pharmacy.

## 2019-02-04 ENCOUNTER — Ambulatory Visit (INDEPENDENT_AMBULATORY_CARE_PROVIDER_SITE_OTHER): Payer: Medicare Other

## 2019-02-04 ENCOUNTER — Other Ambulatory Visit: Payer: Self-pay

## 2019-02-04 DIAGNOSIS — Z0000 Encounter for general adult medical examination without abnormal findings: Secondary | ICD-10-CM | POA: Diagnosis not present

## 2019-02-04 NOTE — Patient Instructions (Addendum)
  Brandi Ramos , Thank you for taking time to come for your Medicare Wellness Visit. I appreciate your ongoing commitment to your health goals. Please review the following plan we discussed and let me know if I can assist you in the future.   These are the goals we discussed: Goals      Patient Stated   . Follow up with Primary Care Provider (pt-stated)     Keep all routine scheduled appointments and follow up as needed.       This is a list of the screening recommended for you and due dates:  Health Maintenance  Topic Date Due  .  Hepatitis C: One time screening is recommended by Center for Disease Control  (CDC) for  adults born from [birth year] through 1963.   1946/08/02  . Tetanus Vaccine  07/07/1967  . DEXA scan (bone density measurement)  07/06/2013  . Flu Shot  04/11/2019  . Mammogram  05/22/2019  . Colon Cancer Screening  09/26/2028  . Pneumonia vaccines  Completed

## 2019-02-04 NOTE — Progress Notes (Signed)
Subjective:   ESMAE DONATHAN is a 73 y.o. female who presents for Medicare Annual (Subsequent) preventive examination.  Review of Systems:  No ROS.  Medicare Wellness Virtual Visit.  Visual/audio telehealth visit, UTA vital signs.   See social history for additional risk factors.   Cardiac Risk Factors include: advanced age (>36men, >58 women)     Objective:     Vitals: There were no vitals taken for this visit.  There is no height or weight on file to calculate BMI.  Advanced Directives 02/04/2019 10/29/2018 10/27/2018 10/27/2018 09/26/2018 03/08/2018 11/13/2016  Does Patient Have a Medical Advance Directive? No No No Yes No No No  Does patient want to make changes to medical advance directive? - - - No - Patient declined - - No - Patient declined  Would patient like information on creating a medical advance directive? No - Patient declined No - Patient declined No - Patient declined - - No - Patient declined -    Tobacco Social History   Tobacco Use  Smoking Status Never Smoker  Smokeless Tobacco Never Used     Counseling given: Not Answered   Clinical Intake:  Pre-visit preparation completed: Yes        Diabetes: No  How often do you need to have someone help you when you read instructions, pamphlets, or other written materials from your doctor or pharmacy?: 1 - Never  Interpreter Needed?: No     Past Medical History:  Diagnosis Date  . Anxiety   . Arthritis   . Chicken pox   . Depression   . Diverticulosis   . Fibrocystic breast disease   . Frozen shoulder   . Fundic gland polyps of stomach, benign   . GERD (gastroesophageal reflux disease)   . History of diverticulitis of colon   . Migraine headache   . Neutropenia ([place])    worked up - Dr Oliva Bustard, slightly elevated ANA  . PUD (peptic ulcer disease)   . Recurrent vaginitis   . Seasonal allergies   . 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    Past Surgical History:   Procedure Laterality Date  . ABDOMINAL HYSTERECTOMY  1984  . COLONOSCOPY    . COLONOSCOPY WITH PROPOFOL N/A 09/26/2018   Procedure: COLONOSCOPY WITH PROPOFOL;  Surgeon: Lollie Sails, MD;  Location[facility] ENDOSCOPY;  Service: Endoscopy;  Laterality: N/A;  . ESOPHAGOGASTRODUODENOSCOPY (EGD) WITH PROPOFOL N/A 10/31/2015   Procedure: ESOPHAGOGASTRODUODENOSCOPY (EGD) WITH PROPOFOL;  Surgeon: Lollie Sails, MD;  Location: [facility] ENDOSCOPY;  Service: Endoscopy;  Laterality: N/A;  . ESOPHAGOGASTRODUODENOSCOPY (EGD) WITH PROPOFOL N/A 09/26/2018   Procedure: ESOPHAGOGASTRODUODENOSCOPY (EGD) WITH PROPOFOL;  Surgeon: Lollie Sails, MD;  Location: [facility] ENDOSCOPY;  Service: Endoscopy;  Laterality: N/A;  . LAPAROSCOPIC APPENDECTOMY N/A 10/29/2018   Procedure: APPENDECTOMY LAPAROSCOPIC;  Surgeon: Herbert Pun, MD;  Location: [facility] ORS;  Service: General;  Laterality: N/A;  . LAPAROSCOPIC BILATERAL SALPINGO OOPHERECTOMY Bilateral 10/29/2018   Procedure: LAPAROSCOPIC BILATERAL SALPINGO OOPHORECTOMY;  Surgeon: Ward, Honor Loh, MD;  Location: [facility] ORS;  Service: Gynecology;  Laterality: Bilateral;   Family History  Problem Relation Age of Onset  . CAD Mother        s/p CABG  . Hypercholesterolemia Mother   . Breast cancer Mother 53  . Heart disease Mother   . Lung cancer Mother   . Hypertension Father   . Migraines Father   . Hypercholesterolemia Father   . Lung cancer  Father   . Migraines Sister   . Arthritis Sister   . Lymphoma Other        grandmother  . Colon cancer Neg Hx    Social History   Socioeconomic History  . Marital status: Widowed    Spouse name: Not on file  . Number of children: 2  . Years of education: Not on file  . Highest education level: Not on file  Occupational History  . Not on file  Social Needs  . Financial resource strain: Not hard at all  . Food insecurity:    Worry: Never true    Inability: Never true  . Transportation needs:    Medical: No     Non-medical: No  Tobacco Use  . Smoking status: Never Smoker  . Smokeless tobacco: Never Used  Substance and Sexual Activity  . Alcohol use: Yes    Alcohol/week: 0.0 standard drinks    Comment: wine occassionally  . Drug use: No  . Sexual activity: Not Currently  Lifestyle  . Physical activity:    Days per week: 4 days    Minutes per session: 40 min  . Stress: Not at all  Relationships  . Social connections:    Talks on phone: Not on file    Gets together: Not on file    Attends religious service: Not on file    Active member of club or organization: Not on file    Attends meetings of clubs or organizations: Not on file    Relationship status: Not on file  Other Topics Concern  . Not on file  Social History Narrative  . Not on file    Outpatient Encounter Medications as of 02/04/2019  Medication Sig  . AIMOVIG 140 DOSE 70 MG/ML SOAJ INJ 1 SYRINGE UTD Q 4 WKS  . ALPRAZolam (XANAX) 0.25 MG tablet TAKE 1 TABLET BY MOUTH EVERY DAY AS NEEDED  . DULoxetine (CYMBALTA) 60 MG capsule TAKE 1 CAPSULE DAILY  . fluticasone (FLONASE) 50 MCG/ACT nasal spray SHAKE LIQUID AND USE 2 SPRAYS IN EACH NOSTRIL DAILY  . LINZESS 145 MCG CAPS capsule Take 145 mcg by mouth daily.  Marland Kitchen loratadine (CLARITIN) 10 MG tablet Take 10 mg by mouth daily.  . meloxicam (MOBIC) 15 MG tablet Take 15 mg by mouth daily.  . propranolol ER (INDERAL LA) 80 MG 24 hr capsule Take 80 mg by mouth at bedtime.   . RABEprazole (ACIPHEX) 20 MG tablet TAKE 1 TABLET DAILY.  . rizatriptan (MAXALT) 10 MG tablet Take 10 mg by mouth as needed. May repeat in 2 hours if needed  . sucralfate (CARAFATE) 1 g tablet Take 1 tablet (1 g total) by mouth 2 (two) times daily as needed. (Patient taking differently: Take 1 g by mouth 2 (two) times daily. )  . topiramate (TOPAMAX) 25 MG tablet TK 1 TO 2 TS PO HS  . triamcinolone ointment (KENALOG) 0.5 % Apply 1 application topically 2 (two) times daily.  . [DISCONTINUED] amoxicillin (AMOXIL) 875  MG tablet Take 1 tablet (875 mg total) by mouth 2 (two) times daily.  . montelukast (SINGULAIR) 10 MG tablet TK 1 T PO D  . SUMAtriptan (IMITREX) 100 MG tablet    No facility-administered encounter medications on file as of 02/04/2019.     Activities of Daily Living In your present state of health, do you have any difficulty performing the following activities: 02/04/2019 10/27/2018  Hearing? N N  Vision? N N  Difficulty concentrating or making decisions?  N N  Walking or climbing stairs? N N  Dressing or bathing? N N  Doing errands, shopping? N -  Preparing Food and eating ? N -  Using the Toilet? N -  In the past six months, have you accidently leaked urine? N -  Do you have problems with loss of bowel control? N -  Managing your Medications? N -  Managing your Finances? N -  Housekeeping or managing your Housekeeping? N -  Some recent data might be hidden    Patient Care Team: Einar Pheasant, MD as PCP - General (Internal Medicine)    Assessment:   This is a routine wellness examination for Aleisha.  I connected with patient 02/04/19 at  9:30 AM EDT by a video/audio enabled telemedicine application and verified that I am speaking with the correct person using two identifiers. Patient stated full name and DOB. Patient gave permission to continue with virtual visit. Patient's location was at home and Nurse's location was at [place] office.   Health Screenings  Mammogram - 05/2018 Colonoscopy - 09/2018 Bone Density - discussed; will follow up with pcp. Hepatitis C Screening- discussed; will follow up with pcp. Glaucoma -none Hearing -demonstrates normal hearing during visit. Hemoglobin A1C - 01/2018 (5.5) Cholesterol - 11/2017 (165)  Dental- every 6 months Vision- visits within the last 12 months.  Social  Alcohol intake - rare        Smoking history- never    Smokers in home? none Illicit drug use? none Exercise - walking about 4 days, 40-50 minutes Diet -  Sexually Active  -not currently BMI- discussed the importance of a healthy diet, water intake and the benefits of aerobic exercise.  Educational material provided.   Safety  Patient feels safe at home- yes Patient does have smoke detectors at home- yes Patient does wear sunscreen or protective clothing when in direct sunlight -yes Patient does wear seat belt when in a moving vehicle -yes  Covid-19 precautions and sickness symptoms discussed.   Activities of Daily Living Patient denies needing assistance with: driving, household chores, feeding themselves, getting from bed to chair, getting to the toilet, bathing/showering, dressing, managing money, or preparing meals.  No new identified risk were noted.    Depression Screen Patient denies losing interest in daily life, feeling hopeless, or crying easily over simple problems.   Medication-taking as directed and without issues.   Fall Screen Patient denies being afraid of falling or falling in the last year.   Memory Screen Patient is alert.  Patient denies difficulty focusing, concentrating or misplacing items. Correctly identified the president of the [place] , season and recall. Patient likes to read, plays computer games, and puzzles for brain stimulation.  Immunizations The following Immunizations were discussed: Influenza, shingles, pneumonia, and tetanus.   Other Providers Patient Care Team: Einar Pheasant, MD as PCP - General (Internal Medicine)  Exercise Activities and Dietary recommendations Current Exercise Habits: Home exercise routine, Type of exercise: walking, Time (Minutes): 45, Frequency (Times/Week): 4, Weekly Exercise (Minutes/Week): 180, Intensity: Mild  Goals      Patient Stated   . Follow up with Primary Care Provider (pt-stated)     Keep all routine scheduled appointments and follow up as needed.       Fall Risk Fall Risk  02/04/2019 11/28/2017 11/13/2016 06/14/2016 01/05/2016  Falls in the past year? 0 No No No No    Depression Screen PHQ 2/9 Scores 02/04/2019 11/28/2017 11/13/2016 06/14/2016  PHQ - 2 Score 0 2 0  0  PHQ- 9 Score - 2 - -     Cognitive Function MMSE - Mini Mental State Exam 11/13/2016  Orientation to time 5  Orientation to Place 5  Registration 3  Attention/ Calculation 5  Recall 3  Language- name 2 objects 2  Language- repeat 1  Language- follow 3 step command 3  Language- read & follow direction 1  Write a sentence 1  Copy design 1  Total score 30     6CIT Screen 02/04/2019  What Year? 0 points  What month? 0 points  What time? 0 points  Count back from 20 0 points  Months in reverse 0 points  Repeat phrase 0 points  Total Score 0    Immunization History  Administered Date(s) Administered  . Influenza Split 06/15/2014  . Influenza, High Dose Seasonal PF 10/11/2016, 05/17/2017, 06/30/2018  . Influenza,inj,Quad PF,6+ Mos 07/01/2013, 06/27/2015  . Influenza-Unspecified 06/30/2018  . Pneumococcal Conjugate-13 11/13/2016  . Pneumococcal Polysaccharide-23 08/27/2018  . Zoster 05/11/2014   Screening Tests Health Maintenance  Topic Date Due  . Hepatitis C Screening  Jun 07, 1946  . TETANUS/TDAP  07/07/1967  . DEXA SCAN  07/06/2013  . INFLUENZA VACCINE  04/11/2019  . MAMMOGRAM  05/22/2019  . COLONOSCOPY  09/26/2028  . PNA vac Low Risk Adult  Completed      Plan:   End of life planning; Advanced aging; Advanced directives discussed.  No HCPOA/Living Will.  Additional information declined at this time.  I have personally reviewed and noted the following in the patient's chart:   . Medical and social history . Use of alcohol, tobacco or illicit drugs  . Current medications and supplements . Functional ability and status . Nutritional status . Physical activity . Advanced directives . List of other physicians . Hospitalizations, surgeries, and ER visits in previous 12 months . Vitals . Screenings to include cognitive, depression, and falls . Referrals and  appointments  In addition, I have reviewed and discussed with patient certain preventive protocols, quality metrics, and best practice recommendations. A written personalized care plan for preventive services as well as general preventive health recommendations were provided to patient.     Varney Biles, LPN  [birth year]   Reviewed above information.  Agree with assessment and plan.    Dr Nicki Reaper

## 2019-02-17 ENCOUNTER — Ambulatory Visit: Payer: Medicare Other | Admitting: Urology

## 2019-03-01 NOTE — Progress Notes (Signed)
03/02/2019 10:29 AM   Brandi Ramos [birth year] [id number]  Referring provider: Einar Pheasant, [address] [place],  [place] [address]  Chief Complaint  Patient presents with  . Nephrolithiasis    Follow up    HPI: Brandi Ramos is a 73 year old female with a nephrolithiasis and a history of hematuria who presents today for a six month follow up.    Nephrolithiasis Contrast CT in 10/2018 noted there is a nonobstructing calculus in the lower pole of the right kidney measuring up to 7 mm on image 47/4. There are probable additional tiny renal calculi. No evidence of ureteral calculus, hydronephrosis or renal mass. The bladder appears unremarkable.  She is having a feeling of tugging in her lower abdomen while she is urinating.  This has been occurring since her surgery with Dr. Leonides Schanz and Dr. Tildon Husky.  Patient denies any gross hematuria, dysuria or suprapubic/flank pain.  Patient denies any fevers, chills, nausea or vomiting.  UA is clear.    History of hematuria Non-smoker.  CTU 01/2018 noted adrenal glands normal.  Nonobstructive right nephrolithiasis includes a 3 mm upper pole calculus on image 73/5; an 8 mm in long axis lower pole calculus on image 63/5; and a 4 mm lower pole calculus on image 66/5.  Left-sided renal calculi include a 1-2 mm upper pole nonobstructive calculus and a 1-2 mm lower pole nonobstructive calculus. There is also faint calcification along the left renal capsule on image 60/5 medially.  No hydronephrosis, hydroureter, or other urinary tract calculus identified. No appreciable abnormal urothelial enhancement or significant additional filling defect along the urothelium. Much of the right ureter does not fill with contrast on the delayed images despite prone positioning.  On image 73/12 there is some cortical hypodensity in thinning in the left mid upper kidney adjacent to the spleen. Given the slightly scalloped contour I favor that this  is probably from remote prior scarring, and a similar appearance was present on 11/16/2015.  Cysto 02/2018 negative.  UA is negative.    PMH: Past Medical History:  Diagnosis Date  . Anxiety   . Arthritis   . Chicken pox   . Depression   . Diverticulosis   . Fibrocystic breast disease   . Frozen shoulder   . Fundic gland polyps of stomach, benign   . GERD (gastroesophageal reflux disease)   . History of diverticulitis of colon   . Migraine headache   . Neutropenia ([place])    worked up - Dr Oliva Bustard, slightly elevated ANA  . PUD (peptic ulcer disease)   . Recurrent vaginitis   . Seasonal allergies   . 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     Surgical History: Past Surgical History:  Procedure Laterality Date  . ABDOMINAL HYSTERECTOMY  1984  . COLONOSCOPY    . COLONOSCOPY WITH PROPOFOL N/A 09/26/2018   Procedure: COLONOSCOPY WITH PROPOFOL;  Surgeon: Lollie Sails, MD;  Location[facility] ENDOSCOPY;  Service: Endoscopy;  Laterality: N/A;  . ESOPHAGOGASTRODUODENOSCOPY (EGD) WITH PROPOFOL N/A 10/31/2015   Procedure: ESOPHAGOGASTRODUODENOSCOPY (EGD) WITH PROPOFOL;  Surgeon: Lollie Sails, MD;  Location: [facility] ENDOSCOPY;  Service: Endoscopy;  Laterality: N/A;  . ESOPHAGOGASTRODUODENOSCOPY (EGD) WITH PROPOFOL N/A 09/26/2018   Procedure: ESOPHAGOGASTRODUODENOSCOPY (EGD) WITH PROPOFOL;  Surgeon: Lollie Sails, MD;  Location: [facility] ENDOSCOPY;  Service: Endoscopy;  Laterality: N/A;  . LAPAROSCOPIC APPENDECTOMY N/A 10/29/2018   Procedure: APPENDECTOMY LAPAROSCOPIC;  Surgeon: Herbert Pun,  MD;  Location: [facility] ORS;  Service: General;  Laterality: N/A;  . LAPAROSCOPIC BILATERAL SALPINGO OOPHERECTOMY Bilateral 10/29/2018   Procedure: LAPAROSCOPIC BILATERAL SALPINGO OOPHORECTOMY;  Surgeon: Ward, Honor Loh, MD;  Location: [facility] ORS;  Service: Gynecology;  Laterality: Bilateral;    Home Medications:  Allergies as of 03/02/2019      Reactions   Amitiza  [lubiprostone] Other (See Comments)   Sulfa Antibiotics       Medication List       Accurate as of March 02, 2019 10:29 AM. If you have any questions, ask your nurse or doctor.        Aimovig (140 MG Dose) 70 MG/ML Soaj Generic drug: Erenumab-aooe INJ 1 SYRINGE UTD Q 4 WKS   ALPRAZolam 0.25 MG tablet Commonly known as: XANAX TAKE 1 TABLET BY MOUTH EVERY DAY AS NEEDED   DULoxetine 60 MG capsule Commonly known as: CYMBALTA TAKE 1 CAPSULE DAILY   fluticasone 50 MCG/ACT nasal spray Commonly known as: FLONASE SHAKE LIQUID AND USE 2 SPRAYS IN EACH NOSTRIL DAILY   Linzess 145 MCG Caps capsule Generic drug: linaclotide Take 145 mcg by mouth daily.   loratadine 10 MG tablet Commonly known as: CLARITIN Take 10 mg by mouth daily.   meloxicam 15 MG tablet Commonly known as: MOBIC Take 15 mg by mouth daily.   montelukast 10 MG tablet Commonly known as: SINGULAIR TK 1 T PO D   propranolol ER 80 MG 24 hr capsule Commonly known as: INDERAL LA Take 80 mg by mouth at bedtime.   RABEprazole 20 MG tablet Commonly known as: ACIPHEX TAKE 1 TABLET DAILY.   rizatriptan 10 MG tablet Commonly known as: MAXALT Take 10 mg by mouth as needed. May repeat in 2 hours if needed   sucralfate 1 g tablet Commonly known as: CARAFATE Take 1 tablet (1 g total) by mouth 2 (two) times daily as needed. What changed: when to take this   SUMAtriptan 100 MG tablet Commonly known as: IMITREX   topiramate 25 MG tablet Commonly known as: TOPAMAX TK 1 TO 2 TS PO HS   triamcinolone ointment 0.5 % Commonly known as: KENALOG Apply 1 application topically 2 (two) times daily.       Allergies:  Allergies  Allergen Reactions  . Amitiza [Lubiprostone] Other (See Comments)  . Sulfa Antibiotics     Family History: Family History  Problem Relation Age of Onset  . CAD Mother        s/p CABG  . Hypercholesterolemia Mother   . Breast cancer Mother 54  . Heart disease Mother   . Lung cancer  Mother   . Hypertension Father   . Migraines Father   . Hypercholesterolemia Father   . Lung cancer Father   . Migraines Sister   . Arthritis Sister   . Lymphoma Other        grandmother  . Colon cancer Neg Hx     Social History:  reports that she has never smoked. She has never used smokeless tobacco. She reports current alcohol use. She reports that she does not use drugs.  ROS: UROLOGY Frequent Urination?: No Hard to postpone urination?: No Burning/pain with urination?: Yes Get up at night to urinate?: No Leakage of urine?: No Urine stream starts and stops?: No Trouble starting stream?: No Do you have to strain to urinate?: No Blood in urine?: No Urinary tract infection?: No Sexually transmitted disease?: No Injury to kidneys or bladder?: No Painful intercourse?: No Weak stream?: No  Currently pregnant?: No Vaginal bleeding?: No Last menstrual period?: no  Gastrointestinal Nausea?: No Vomiting?: No Indigestion/heartburn?: No Diarrhea?: No Constipation?: No  Constitutional Fever: No Night sweats?: No Weight loss?: No Fatigue?: No  Skin Skin rash/lesions?: No Itching?: No  Eyes Blurred vision?: No Double vision?: No  Ears/Nose/Throat Sore throat?: No Sinus problems?: No  Hematologic/Lymphatic Swollen glands?: No Easy bruising?: No  Cardiovascular Leg swelling?: No Chest pain?: No  Respiratory Cough?: No Shortness of breath?: No  Endocrine Excessive thirst?: No  Musculoskeletal Back pain?: No Joint pain?: No  Neurological Headaches?: No Dizziness?: No  Psychologic Depression?: No Anxiety?: No  Physical Exam: BP 106/67   Pulse 76   Ht 5' 4.5" (1.638 m)   Wt 114 lb (51.7 kg)   BMI 19.27 kg/m   Constitutional:  Well nourished. Alert and oriented, No acute distress. HEENT: [place] AT, moist mucus membranes.  Trachea midline, no masses. Cardiovascular: No clubbing, cyanosis, or edema. Respiratory: Normal respiratory effort, no  increased work of breathing. Neurologic: Grossly intact, no focal deficits, moving all 4 extremities. Psychiatric: Normal mood and affect.  Laboratory Data: Lab Results  Component Value Date   WBC 4.2 12/03/2017   HGB 12.1 12/03/2017   HCT 36.2 12/03/2017   MCV 87.0 12/03/2017   PLT 177.0 12/03/2017    Lab Results  Component Value Date   CREATININE 0.74 10/15/2018    No results found for: PSA  No results found for: TESTOSTERONE  Lab Results  Component Value Date   HGBA1C 5.5 01/15/2018    Lab Results  Component Value Date   TSH 3.50 12/03/2017       Component Value Date/Time   CHOL 165 12/03/2017 0808   HDL 48.30 12/03/2017 0808   CHOLHDL 3 12/03/2017 0808   VLDL 22.4 12/03/2017 0808   LDLCALC 94 12/03/2017 0808    Lab Results  Component Value Date   AST 21 12/03/2017   Lab Results  Component Value Date   ALT 11 12/03/2017   No components found for: ALKALINEPHOPHATASE No components found for: BILIRUBINTOTAL  No results found for: ESTRADIOL  Urinalysis [place].  See Epic.  I have reviewed the labs.   Pertinent Imaging: CLINICAL DATA:  Nephrolithiasis.  EXAM: ABDOMEN - 1 VIEW  COMPARISON:  CT scan of October 15, 2018.  FINDINGS: The bowel gas pattern is normal. Multiple right renal calculi are noted, with the largest measuring 6 mm over the lower pole. No definite evidence of left-sided nephrolithiasis is noted. No bowel obstruction or ileus is noted.  IMPRESSION: Right nephrolithiasis is noted. No evidence of bowel obstruction or ileus.   Electronically Signed   By: Marijo Conception M.D.   On: 03/02/2019 12:29  I have independently reviewed the films and note the right nephrolithiasis with the 6 mm stone being the predominate stone.   Assessment & Plan:    1. Right nephrolithiasis Explained that the right renal stone is not the cause of her current symptoms and it is likely due to her recovering from the surgery Will plan for  follow up in 6 months for KUB Patient is advised that if they should start to experience pain that is not able to be controlled with pain medication, intractable nausea and/or vomiting and/or fevers greater than 103 or shaking chills to contact the office immediately or seek treatment in the emergency department for emergent intervention.    History of hematuria Hematuria work up completed in 02/2018 - findings positive for bilateral nephrolithiasis No report  of gross hematuria  UA today negative for [facility] RTC in one year for UA - patient to report any gross hematuria in the interim    Return in about 6 months (around 09/01/2019) for KUB, UA and office visit .  These notes generated with voice recognition software. I apologize for typographical errors.  Zara Council, PA-C  [facility] [address]  [place], [place] [address] [phone]

## 2019-03-02 ENCOUNTER — Encounter: Payer: Self-pay | Admitting: Urology

## 2019-03-02 ENCOUNTER — Ambulatory Visit (INDEPENDENT_AMBULATORY_CARE_PROVIDER_SITE_OTHER): Payer: Medicare Other | Admitting: Urology

## 2019-03-02 ENCOUNTER — Ambulatory Visit
Admission: RE | Admit: 2019-03-02 | Discharge: 2019-03-02 | Disposition: A | Discharge status: Home / Self Care | Payer: Medicare Other | Source: Ambulatory Visit | Attending: Urology | Admitting: Urology

## 2019-03-02 ENCOUNTER — Other Ambulatory Visit: Payer: Self-pay | Admitting: Family Medicine

## 2019-03-02 ENCOUNTER — Other Ambulatory Visit: Payer: Self-pay

## 2019-03-02 VITALS — BP 106/67 | HR 76 | Ht 64.5 in | Wt 114.0 lb

## 2019-03-02 DIAGNOSIS — Z87448 Personal history of other diseases of urinary system: Secondary | ICD-10-CM | POA: Diagnosis not present

## 2019-03-02 DIAGNOSIS — N200 Calculus of kidney: Secondary | ICD-10-CM | POA: Diagnosis not present

## 2019-03-02 LAB — URINALYSIS, COMPLETE
Bilirubin, UA: NEGATIVE
Glucose, UA: NEGATIVE
Ketones, UA: NEGATIVE
Nitrite, UA: NEGATIVE
Protein,UA: NEGATIVE
RBC, UA: NEGATIVE
Specific Gravity, UA: 1.02 (ref 1.005–1.030)
Urobilinogen, Ur: 0.2 mg/dL (ref 0.2–1.0)
pH, UA: 6.5 (ref 5.0–7.5)

## 2019-03-02 LAB — MICROSCOPIC EXAMINATION: RBC: NONE SEEN /hpf (ref 0–2)

## 2019-03-12 ENCOUNTER — Other Ambulatory Visit: Payer: Self-pay | Admitting: Internal Medicine

## 2019-03-30 ENCOUNTER — Encounter: Payer: Self-pay | Admitting: Internal Medicine

## 2019-03-30 ENCOUNTER — Ambulatory Visit (INDEPENDENT_AMBULATORY_CARE_PROVIDER_SITE_OTHER): Payer: Medicare Other | Admitting: Internal Medicine

## 2019-03-30 ENCOUNTER — Other Ambulatory Visit: Payer: Self-pay

## 2019-03-30 DIAGNOSIS — K5900 Constipation, unspecified: Secondary | ICD-10-CM

## 2019-03-30 DIAGNOSIS — K219 Gastro-esophageal reflux disease without esophagitis: Secondary | ICD-10-CM

## 2019-03-30 DIAGNOSIS — F419 Anxiety disorder, unspecified: Secondary | ICD-10-CM

## 2019-03-30 DIAGNOSIS — Z1231 Encounter for screening mammogram for malignant neoplasm of breast: Secondary | ICD-10-CM

## 2019-03-30 DIAGNOSIS — Z9109 Other allergy status, other than to drugs and biological substances: Secondary | ICD-10-CM | POA: Diagnosis not present

## 2019-03-30 DIAGNOSIS — D72819 Decreased white blood cell count, unspecified: Secondary | ICD-10-CM

## 2019-03-30 DIAGNOSIS — R319 Hematuria, unspecified: Secondary | ICD-10-CM

## 2019-03-30 DIAGNOSIS — G43709 Chronic migraine without aura, not intractable, without status migrainosus: Secondary | ICD-10-CM | POA: Diagnosis not present

## 2019-03-30 NOTE — Progress Notes (Signed)
Patient ID: Brandi Ramos, female   DOB: 03-16-1946, 73 y.o.   MRN: [id number]   Virtual Visit via video Note  This visit type was conducted due to national recommendations for restrictions regarding the COVID-19 pandemic (e.g. social distancing).  This format is felt to be most appropriate for this patient at this time.  All issues noted in this document were discussed and addressed.  No physical exam was performed (except for noted visual exam findings with Video Visits).   I connected with Yolanda Bonine by a video enabled telemedicine application and verified that I am speaking with the correct person using two identifiers. Location patient: home Location provider: work Persons participating in the virtual visit: patient, provider  I discussed the limitations, risks, security and privacy concerns of performing an evaluation and management service by video and the availability of in person appointments.  The patient expressed understanding and agreed to proceed.   Reason for visit: scheduled follow up.    HPI: She reports she is doing relatively well.  Increased stress.  Trying to stay in due to covid restrictions, but did travel to [place] last week.  Did fly.  Stayed in her family's home.  No fever.  No chest congestion, cough or sob.  Taking aciphex.  Has been evaluated by GI.  Nausea better.  Still some issues with her bowels moving.  miralax caused bloating.  linzess was expensive.  Taking a laxative.  Will have a bowel movement 2x/week.  Saw urology.  CT 10/2018 - non obstructing calculus - right kidney.  Recommended f/u KUB in 6 months.  Discomfort is better.  Discussed f/u mammogram.  Last 05/22/2018.  She will schedule.  Request refill alprazolam.     ROS: See pertinent positives and negatives per HPI.  Past Medical History:  Diagnosis Date  . Anxiety   . Arthritis   . Chicken pox   . Depression   . Diverticulosis   . Fibrocystic breast disease   . Frozen shoulder   . Fundic  gland polyps of stomach, benign   . GERD (gastroesophageal reflux disease)   . History of diverticulitis of colon   . Migraine headache   . Neutropenia ([place])    worked up - Dr Oliva Bustard, slightly elevated ANA  . PUD (peptic ulcer disease)   . Recurrent vaginitis   . Seasonal allergies   . 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     Past Surgical History:  Procedure Laterality Date  . ABDOMINAL HYSTERECTOMY  1984  . COLONOSCOPY    . COLONOSCOPY WITH PROPOFOL N/A 09/26/2018   Procedure: COLONOSCOPY WITH PROPOFOL;  Surgeon: Lollie Sails, MD;  Location: [facility] ENDOSCOPY;  Service: Endoscopy;  Laterality: N/A;  . ESOPHAGOGASTRODUODENOSCOPY (EGD) WITH PROPOFOL N/A 10/31/2015   Procedure: ESOPHAGOGASTRODUODENOSCOPY (EGD) WITH PROPOFOL;  Surgeon: Lollie Sails, MD;  Location: [facility] ENDOSCOPY;  Service: Endoscopy;  Laterality: N/A;  . ESOPHAGOGASTRODUODENOSCOPY (EGD) WITH PROPOFOL N/A 09/26/2018   Procedure: ESOPHAGOGASTRODUODENOSCOPY (EGD) WITH PROPOFOL;  Surgeon: Lollie Sails, MD;  Location: [facility] ENDOSCOPY;  Service: Endoscopy;  Laterality: N/A;  . LAPAROSCOPIC APPENDECTOMY N/A 10/29/2018   Procedure: APPENDECTOMY LAPAROSCOPIC;  Surgeon: Herbert Pun, MD;  Location: [facility] ORS;  Service: General;  Laterality: N/A;  . LAPAROSCOPIC BILATERAL SALPINGO OOPHERECTOMY Bilateral 10/29/2018   Procedure: LAPAROSCOPIC BILATERAL SALPINGO OOPHORECTOMY;  Surgeon: Ward, Honor Loh, MD;  Location: [facility] ORS;  Service: Gynecology;  Laterality: Bilateral;    Family History  Problem Relation  Age of Onset  . CAD Mother        s/p CABG  . Hypercholesterolemia Mother   . Breast cancer Mother 37  . Heart disease Mother   . Lung cancer Mother   . Hypertension Father   . Migraines Father   . Hypercholesterolemia Father   . Lung cancer Father   . Migraines Sister   . Arthritis Sister   . Lymphoma Other        grandmother  . Colon cancer Neg Hx     SOCIAL HX: reviewed.     Current Outpatient Medications:  .  AIMOVIG 140 DOSE 70 MG/ML SOAJ, INJ 1 SYRINGE UTD Q 4 WKS, Disp: , Rfl: 11 .  DULoxetine (CYMBALTA) 60 MG capsule, TAKE 1 CAPSULE DAILY, Disp: 90 capsule, Rfl: 0 .  fluticasone (FLONASE) 50 MCG/ACT nasal spray, SHAKE LIQUID AND USE 2 SPRAYS IN EACH NOSTRIL DAILY, Disp: 16 g, Rfl: 11 .  LINZESS 145 MCG CAPS capsule, Take 145 mcg by mouth daily., Disp: , Rfl: 3 .  loratadine (CLARITIN) 10 MG tablet, Take 10 mg by mouth daily., Disp: , Rfl:  .  meloxicam (MOBIC) 15 MG tablet, Take 15 mg by mouth daily., Disp: , Rfl:  .  montelukast (SINGULAIR) 10 MG tablet, TK 1 T PO D, Disp: , Rfl:  .  propranolol ER (INDERAL LA) 60 MG 24 hr capsule, TK 1 C PO D, Disp: , Rfl:  .  RABEprazole (ACIPHEX) 20 MG tablet, TAKE 1 TABLET DAILY., Disp: 90 tablet, Rfl: 1 .  rizatriptan (MAXALT) 10 MG tablet, Take 10 mg by mouth as needed. May repeat in 2 hours if needed, Disp: , Rfl:  .  sucralfate (CARAFATE) 1 g tablet, Take 1 tablet (1 g total) by mouth 2 (two) times daily as needed. (Patient taking differently: Take 1 g by mouth 2 (two) times daily. ), Disp: 60 tablet, Rfl: 1 .  SUMAtriptan (IMITREX) 100 MG tablet, , Disp: , Rfl:  .  topiramate (TOPAMAX) 25 MG tablet, TK 1 TO 2 TS PO HS, Disp: , Rfl: 3 .  triamcinolone ointment (KENALOG) 0.5 %, Apply 1 application topically 2 (two) times daily., Disp: 30 g, Rfl: 0 .  ALPRAZolam (XANAX) 0.25 MG tablet, TAKE 1 TABLET BY MOUTH EVERY DAY AS NEEDED, Disp: 30 tablet, Rfl: 1  EXAM:  GENERAL: alert, oriented, appears well and in no acute distress  HEENT: atraumatic, conjunttiva clear, no obvious abnormalities on inspection of external nose and ears  NECK: normal movements of the head and neck  LUNGS: on inspection no signs of respiratory distress, breathing rate appears normal, no obvious gross SOB, gasping or wheezing  CV: no obvious cyanosis  PSYCH/NEURO: pleasant and cooperative, no obvious depression or anxiety, speech and  thought processing grossly intact  ASSESSMENT AND PLAN:  Discussed the following assessment and plan:  Anxiety Increased stress.  Discussed with her today.  She has good support.  Request refill xanax.  Will refill.  Follow. Overall she feels she is handling things relatively well.    Environmental allergies Controlled.   GERD (gastroesophageal reflux disease) On aciphex.  Nausea improved.  Followed by GI.   Hematuria Worked up by urology.  CT as outlined.  Last evaluated 10/2018.  Recommended 6 month f/u KUB.  Discussed f/u with her today.   Leukopenia Has been stable.  Follow cbc.   Migraine headache Followed by neurology.  Stable.  Has f/u planned 04/08/19.   Constipation Persistent problems with her bowels.  miralax caused bloating.  Taking laxative.  Has a couple of bowel movements per week.  Linzess worked, but expensive.  D/w GI regarding other treatment options.      I discussed the assessment and treatment plan with the patient. The patient was provided an opportunity to ask questions and all were answered. The patient agreed with the plan and demonstrated an understanding of the instructions.   The patient was advised to call back or seek an in-person evaluation if the symptoms worsen or if the condition fails to improve as anticipated.   Einar Pheasant, MD

## 2019-04-03 ENCOUNTER — Telehealth: Payer: Self-pay | Admitting: Internal Medicine

## 2019-04-03 MED ORDER — ALPRAZOLAM 0.25 MG PO TABS: ORAL_TABLET | ORAL | 1 refills | Status: DC

## 2019-04-03 NOTE — Telephone Encounter (Signed)
rx ok'd for xanax #30 with 1 refill.

## 2019-04-03 NOTE — Telephone Encounter (Signed)
Medication Refill - Medication:  ALPRAZolam (XANAX) 0.25 MG tablet (Patient is completely out of medication and called this morning to request medication but did not see notation in chart.)  Has the patient contacted their pharmacy? Yes (Agent: If no, request that the patient contact the pharmacy for the refill.) (Agent: If yes, when and what did the pharmacy advise?)Contact PCP  Preferred Pharmacy (with phone number or street name): [id number] - [place], [place] [phone] (Phone) [phone] (Fax)     Agent: Please be advised that RX refills may take up to 3 business days. We ask that you follow-up with your pharmacy.

## 2019-04-03 NOTE — Telephone Encounter (Signed)
Last OV (for anxiety):  12/26/18  Last Refill:  02/03/19

## 2019-04-05 ENCOUNTER — Encounter: Payer: Self-pay | Admitting: Internal Medicine

## 2019-04-05 DIAGNOSIS — K5900 Constipation, unspecified: Secondary | ICD-10-CM | POA: Insufficient documentation

## 2019-04-05 NOTE — Assessment & Plan Note (Signed)
Increased stress.  Discussed with her today.  She has good support.  Request refill xanax.  Will refill.  Follow. Overall she feels she is handling things relatively well.

## 2019-04-05 NOTE — Assessment & Plan Note (Signed)
Controlled.  

## 2019-04-05 NOTE — Assessment & Plan Note (Signed)
On aciphex.  Nausea improved.  Followed by GI.

## 2019-04-05 NOTE — Assessment & Plan Note (Signed)
Has been stable. Follow cbc.  

## 2019-04-05 NOTE — Assessment & Plan Note (Signed)
Worked up by urology.  CT as outlined.  Last evaluated 10/2018.  Recommended 6 month f/u KUB.  Discussed f/u with her today.

## 2019-04-05 NOTE — Assessment & Plan Note (Addendum)
Followed by neurology.  Stable.  Has f/u planned 04/08/19.

## 2019-04-05 NOTE — Assessment & Plan Note (Signed)
Persistent problems with her bowels.  miralax caused bloating.  Taking laxative.  Has a couple of bowel movements per week.  Linzess worked, but expensive.  D/w GI regarding other treatment options.

## 2019-04-08 ENCOUNTER — Encounter: Payer: Self-pay | Admitting: Internal Medicine

## 2019-04-08 DIAGNOSIS — R52 Pain, unspecified: Secondary | ICD-10-CM | POA: Diagnosis not present

## 2019-04-08 DIAGNOSIS — G43719 Chronic migraine without aura, intractable, without status migrainosus: Secondary | ICD-10-CM | POA: Diagnosis not present

## 2019-04-08 DIAGNOSIS — G4723 Circadian rhythm sleep disorder, irregular sleep wake type: Secondary | ICD-10-CM | POA: Diagnosis not present

## 2019-04-16 ENCOUNTER — Telehealth: Payer: Self-pay | Admitting: Internal Medicine

## 2019-04-16 DIAGNOSIS — D72819 Decreased white blood cell count, unspecified: Secondary | ICD-10-CM

## 2019-04-16 DIAGNOSIS — F419 Anxiety disorder, unspecified: Secondary | ICD-10-CM

## 2019-04-16 DIAGNOSIS — Z13220 Encounter for screening for lipoid disorders: Secondary | ICD-10-CM

## 2019-04-16 NOTE — Telephone Encounter (Signed)
Pt was wondering if Dr. Nicki Reaper wanted to do labs, they talked about it at pt's last visit. Pt has a yearly follow up also in Dec.

## 2019-04-16 NOTE — Telephone Encounter (Signed)
Patient is wondering if she needs fasting labs. Do you want to go ahead and do labs or have them scheduled prior to her appt in December?

## 2019-04-17 NOTE — Telephone Encounter (Signed)
Orders placed for labs.  Schedule her for fasting lab appt in the next 3-4 weeks.

## 2019-04-17 NOTE — Telephone Encounter (Signed)
Labs scheduled  

## 2019-04-30 ENCOUNTER — Other Ambulatory Visit: Payer: Self-pay | Admitting: Internal Medicine

## 2019-04-30 MED ORDER — RABEPRAZOLE SODIUM 20 MG PO TBEC: 20.0000 mg | DELAYED_RELEASE_TABLET | Freq: Every day | ORAL | 1 refills | Status: DC

## 2019-04-30 MED ORDER — DULOXETINE HCL 60 MG PO CPEP: 60.0000 mg | ORAL_CAPSULE | Freq: Every day | ORAL | 0 refills | Status: DC

## 2019-04-30 NOTE — Telephone Encounter (Signed)
Medication Refill - Medication: DULoxetine (CYMBALTA) 60 MG capsule [[id number]]   RABEprazole (ACIPHEX) 20 MG tablet [[id number]]      Has the patient contacted their pharmacy? Yes  Preferred Pharmacy (with phone number or street name):  [facility], [place] to [facility] [phone] (Phone) [phone] (Fax)     Agent: Please be advised that RX refills may take up to 3 business days. We ask that you follow-up with your pharmacy.

## 2019-05-13 ENCOUNTER — Other Ambulatory Visit: Payer: Self-pay

## 2019-05-13 ENCOUNTER — Ambulatory Visit (INDEPENDENT_AMBULATORY_CARE_PROVIDER_SITE_OTHER): Payer: Medicare Other | Admitting: Internal Medicine

## 2019-05-13 ENCOUNTER — Encounter: Payer: Self-pay | Admitting: Internal Medicine

## 2019-05-13 ENCOUNTER — Other Ambulatory Visit (INDEPENDENT_AMBULATORY_CARE_PROVIDER_SITE_OTHER): Payer: Medicare Other

## 2019-05-13 DIAGNOSIS — F419 Anxiety disorder, unspecified: Secondary | ICD-10-CM | POA: Diagnosis not present

## 2019-05-13 DIAGNOSIS — D72819 Decreased white blood cell count, unspecified: Secondary | ICD-10-CM

## 2019-05-13 DIAGNOSIS — F32A Depression, unspecified: Secondary | ICD-10-CM

## 2019-05-13 DIAGNOSIS — F320 Major depressive disorder, single episode, mild: Secondary | ICD-10-CM | POA: Diagnosis not present

## 2019-05-13 DIAGNOSIS — Z13220 Encounter for screening for lipoid disorders: Secondary | ICD-10-CM | POA: Diagnosis not present

## 2019-05-13 LAB — LIPID PANEL
Cholesterol: 185 mg/dL (ref 0–200)
HDL: 59.3 mg/dL (ref >39.00)
LDL Cholesterol: 105 mg/dL — ABNORMAL HIGH (ref 0–99)
NonHDL: 125.49
Total CHOL/HDL Ratio: 3
Triglycerides: 100 mg/dL (ref 0.0–149.0)
VLDL: 20 mg/dL (ref 0.0–40.0)

## 2019-05-13 LAB — COMPREHENSIVE METABOLIC PANEL
ALT: 8 U/L (ref 0–35)
AST: 21 U/L (ref 0–37)
Albumin: 4 g/dL (ref 3.5–5.2)
Alkaline Phosphatase: 54 U/L (ref 39–117)
BUN: 14 mg/dL (ref 6–23)
CO2: 31 mEq/L (ref 19–32)
Calcium: 9.5 mg/dL (ref 8.4–10.5)
Chloride: 106 mEq/L (ref 96–112)
Creatinine, Ser: 0.9 mg/dL (ref 0.40–1.20)
GFR: 61.75 mL/min (ref >60.00)
Glucose, Bld: 101 mg/dL — ABNORMAL HIGH (ref 70–99)
Potassium: 3.7 mEq/L (ref 3.5–5.1)
Sodium: 142 mEq/L (ref 135–145)
Total Bilirubin: 0.6 mg/dL (ref 0.2–1.2)
Total Protein: 7 g/dL (ref 6.0–8.3)

## 2019-05-13 LAB — CBC WITH DIFFERENTIAL/PLATELET
Basophils Absolute: 0 10*3/uL (ref 0.0–0.1)
Basophils Relative: 0.6 % (ref 0.0–3.0)
Eosinophils Absolute: 0.2 10*3/uL (ref 0.0–0.7)
Eosinophils Relative: 3.6 % (ref 0.0–5.0)
HCT: 38.3 % (ref 36.0–46.0)
Hemoglobin: 12.8 g/dL (ref 12.0–15.0)
Lymphocytes Relative: 31.8 % (ref 12.0–46.0)
Lymphs Abs: 1.4 10*3/uL (ref 0.7–4.0)
MCHC: 33.4 g/dL (ref 30.0–36.0)
MCV: 89.1 fl (ref 78.0–100.0)
Monocytes Absolute: 0.4 10*3/uL (ref 0.1–1.0)
Monocytes Relative: 10.2 % (ref 3.0–12.0)
Neutro Abs: 2.3 10*3/uL (ref 1.4–7.7)
Neutrophils Relative %: 53.8 % (ref 43.0–77.0)
Platelets: 178 10*3/uL (ref 150.0–400.0)
RBC: 4.3 Mil/uL (ref 3.87–5.11)
RDW: 13.6 % (ref 11.5–15.5)
WBC: 4.3 10*3/uL (ref 4.0–10.5)

## 2019-05-13 LAB — TSH: TSH: 3.35 u[IU]/mL (ref 0.35–4.50)

## 2019-05-13 MED ORDER — BUSPIRONE HCL 5 MG PO TABS: 5.0000 mg | ORAL_TABLET | Freq: Every day | ORAL | 1 refills | Status: DC

## 2019-05-13 NOTE — Assessment & Plan Note (Signed)
Increased stress and anxiety as outlined.  Discussed with her today.  Has good support.  Does not feel needs counseling or psych referral.  On cymbalta.  Has xanax - takes q hs.  Add buspar 5mg  q am.  Can increase to 10mg  if needed.  Pt agreeable with plan.  (if persistent problems of if buspar does not work, can add zoloft - low dose).  Get her back in soon to reassess.

## 2019-05-13 NOTE — Progress Notes (Signed)
Patient ID: Brandi Ramos, female   DOB: 05-05-1946, 73 y.o.   MRN: [id number]   Virtual Visit via video Note  This visit type was conducted due to national recommendations for restrictions regarding the COVID-19 pandemic (e.g. social distancing).  This format is felt to be most appropriate for this patient at this time.  All issues noted in this document were discussed and addressed.  No physical exam was performed (except for noted visual exam findings with Video Visits).   I connected with Yolanda Bonine by a video enabled telemedicine application and verified that I am speaking with the correct person using two identifiers. Location patient: home Location provider: work Persons participating in the virtual visit: patient, provider  I discussed the limitations, risks, security and privacy concerns of performing an evaluation and management service by telephone and the availability of in person appointments. The patient expressed understanding and agreed to proceed.   Reason for visit: work in appt  HPI: Work in appt to discuss increased anxiety and some minimal depression.  She has been in a relationship for four years.  Recently her friend informed her he needed some "time".  They are still talking and seeing each other, but she feels something is different.  Increased anxiety related to this.  Feels at times she is starting to have what feels like a panic attack.  Was questioning increasing the xanax.  States she also feels a "little blue" at times.  Also increased stress with covid.  Family issues are better.  Does have good support.  Does not feel needs counseling or psych referral at this time.  Is on cymbalta.  Takes xanax at night.  No suicidal ideations.  Eating.  Some decreased appetite, but states her weight is relatively stable and she is eating.  No nausea or vomiting.  Bowels moving.    ROS: See pertinent positives and negatives per HPI.  Past Medical History:  Diagnosis Date   . Anxiety   . Arthritis   . Chicken pox   . Depression   . Diverticulosis   . Fibrocystic breast disease   . Frozen shoulder   . Fundic gland polyps of stomach, benign   . GERD (gastroesophageal reflux disease)   . History of diverticulitis of colon   . Migraine headache   . Neutropenia ([place])    worked up - Dr Oliva Bustard, slightly elevated ANA  . PUD (peptic ulcer disease)   . Recurrent vaginitis   . Seasonal allergies   . 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     Past Surgical History:  Procedure Laterality Date  . ABDOMINAL HYSTERECTOMY  1984  . COLONOSCOPY    . COLONOSCOPY WITH PROPOFOL N/A 09/26/2018   Procedure: COLONOSCOPY WITH PROPOFOL;  Surgeon: Lollie Sails, MD;  Location: [facility] ENDOSCOPY;  Service: Endoscopy;  Laterality: N/A;  . ESOPHAGOGASTRODUODENOSCOPY (EGD) WITH PROPOFOL N/A 10/31/2015   Procedure: ESOPHAGOGASTRODUODENOSCOPY (EGD) WITH PROPOFOL;  Surgeon: Lollie Sails, MD;  Location: [facility] ENDOSCOPY;  Service: Endoscopy;  Laterality: N/A;  . ESOPHAGOGASTRODUODENOSCOPY (EGD) WITH PROPOFOL N/A 09/26/2018   Procedure: ESOPHAGOGASTRODUODENOSCOPY (EGD) WITH PROPOFOL;  Surgeon: Lollie Sails, MD;  Location[facility] ENDOSCOPY;  Service: Endoscopy;  Laterality: N/A;  . LAPAROSCOPIC APPENDECTOMY N/A 10/29/2018   Procedure: APPENDECTOMY LAPAROSCOPIC;  Surgeon: Herbert Pun, MD;  Location: [facility] ORS;  Service: General;  Laterality: N/A;  . LAPAROSCOPIC BILATERAL SALPINGO OOPHERECTOMY Bilateral 10/29/2018   Procedure: LAPAROSCOPIC BILATERAL SALPINGO OOPHORECTOMY;  Surgeon:  Ward, Honor Loh, MD;  Location: [facility] ORS;  Service: Gynecology;  Laterality: Bilateral;    Family History  Problem Relation Age of Onset  . CAD Mother        s/p CABG  . Hypercholesterolemia Mother   . Breast cancer Mother 22  . Heart disease Mother   . Lung cancer Mother   . Hypertension Father   . Migraines Father   . Hypercholesterolemia Father   . Lung  cancer Father   . Migraines Sister   . Arthritis Sister   . Lymphoma Other        grandmother  . Colon cancer Neg Hx     SOCIAL HX: reviewed.    Current Outpatient Medications:  .  AIMOVIG 140 DOSE 70 MG/ML SOAJ, INJ 1 SYRINGE UTD Q 4 WKS, Disp: , Rfl: 11 .  ALPRAZolam (XANAX) 0.25 MG tablet, TAKE 1 TABLET BY MOUTH EVERY DAY AS NEEDED, Disp: 30 tablet, Rfl: 1 .  busPIRone (BUSPAR) 5 MG tablet, Take 1 tablet (5 mg total) by mouth daily. Take in the am, Disp: 30 tablet, Rfl: 1 .  DULoxetine (CYMBALTA) 60 MG capsule, Take 1 capsule (60 mg total) by mouth daily., Disp: 90 capsule, Rfl: 0 .  fluticasone (FLONASE) 50 MCG/ACT nasal spray, SHAKE LIQUID AND USE 2 SPRAYS IN EACH NOSTRIL DAILY, Disp: 16 g, Rfl: 11 .  LINZESS 145 MCG CAPS capsule, Take 145 mcg by mouth daily., Disp: , Rfl: 3 .  loratadine (CLARITIN) 10 MG tablet, Take 10 mg by mouth daily., Disp: , Rfl:  .  meloxicam (MOBIC) 15 MG tablet, Take 15 mg by mouth daily., Disp: , Rfl:  .  montelukast (SINGULAIR) 10 MG tablet, TK 1 T PO D, Disp: , Rfl:  .  propranolol ER (INDERAL LA) 60 MG 24 hr capsule, TK 1 C PO D, Disp: , Rfl:  .  RABEprazole (ACIPHEX) 20 MG tablet, Take 1 tablet (20 mg total) by mouth daily., Disp: 90 tablet, Rfl: 1 .  rizatriptan (MAXALT) 10 MG tablet, Take 10 mg by mouth as needed. May repeat in 2 hours if needed, Disp: , Rfl:  .  sucralfate (CARAFATE) 1 g tablet, Take 1 tablet (1 g total) by mouth 2 (two) times daily as needed. (Patient taking differently: Take 1 g by mouth 2 (two) times daily. ), Disp: 60 tablet, Rfl: 1 .  SUMAtriptan (IMITREX) 100 MG tablet, , Disp: , Rfl:  .  topiramate (TOPAMAX) 25 MG tablet, TK 1 TO 2 TS PO HS, Disp: , Rfl: 3 .  triamcinolone ointment (KENALOG) 0.5 %, Apply 1 application topically 2 (two) times daily., Disp: 30 g, Rfl: 0  EXAM:  GENERAL: alert, oriented, appears well and in no acute distress  HEENT: atraumatic, conjunttiva clear, no obvious abnormalities on inspection of  external nose and ears  NECK: normal movements of the head and neck  LUNGS: on inspection no signs of respiratory distress, breathing rate appears normal, no obvious gross SOB, gasping or wheezing  CV: no obvious cyanosis  PSYCH/NEURO: pleasant and cooperative, no obvious depression or anxiety, speech and thought processing grossly intact  ASSESSMENT AND PLAN:  Discussed the following assessment and plan:  Anxiety Increased stress and anxiety as outlined.  Discussed with her today.  Has good support.  Does not feel needs counseling or psych referral.  On cymbalta.  Has xanax - takes q hs.  Add buspar 5mg  q am.  Can increase to 10mg  if needed.  Pt agreeable with plan.  (  if persistent problems of if buspar does not work, can add zoloft - low dose).  Get her back in soon to reassess.    Mild depression ([place]) Discussed with her today.  No suicidal ideations.  On cymbalta.  Does not feel needs referral to counselor or psychiatry at this time.  Add buspar as outlined.  Follow closely.  Schedule f/u soon to reassess.  Call if problems.    Leukopenia F/u cbc checked today.      I discussed the assessment and treatment plan with the patient. The patient was provided an opportunity to ask questions and all were answered. The patient agreed with the plan and demonstrated an understanding of the instructions.   The patient was advised to call back or seek an in-person evaluation if the symptoms worsen or if the condition fails to improve as anticipated.   Einar Pheasant, MD

## 2019-05-13 NOTE — Assessment & Plan Note (Signed)
F/u cbc checked today.

## 2019-05-13 NOTE — Assessment & Plan Note (Signed)
Discussed with her today.  No suicidal ideations.  On cymbalta.  Does not feel needs referral to counselor or psychiatry at this time.  Add buspar as outlined.  Follow closely.  Schedule f/u soon to reassess.  Call if problems.

## 2019-05-25 ENCOUNTER — Ambulatory Visit
Admission: RE | Admit: 2019-05-25 | Discharge: 2019-05-25 | Disposition: A | Discharge status: Home / Self Care | Payer: Medicare Other | Source: Ambulatory Visit | Attending: Internal Medicine | Admitting: Internal Medicine

## 2019-05-25 DIAGNOSIS — Z1231 Encounter for screening mammogram for malignant neoplasm of breast: Secondary | ICD-10-CM | POA: Diagnosis not present

## 2019-06-01 DIAGNOSIS — H2513 Age-related nuclear cataract, bilateral: Secondary | ICD-10-CM | POA: Diagnosis not present

## 2019-06-03 ENCOUNTER — Other Ambulatory Visit: Payer: Self-pay | Admitting: Internal Medicine

## 2019-06-03 NOTE — Telephone Encounter (Signed)
Requested medication (s) are due for refill today: yes  Requested medication (s) are on the active medication list:yes  Last refill: 04/03/2019  Future visit scheduled: yes  Notes to clinic:  Refill cannot be delegated    Requested Prescriptions  Pending Prescriptions Disp Refills   ALPRAZolam (XANAX) 0.25 MG tablet 30 tablet 1    Sig: TAKE 1 TABLET BY MOUTH EVERY DAY AS NEEDED     Not Delegated - Psychiatry:  Anxiolytics/Hypnotics Failed - 06/03/2019 12:19 PM      Failed - This refill cannot be delegated      Failed - Urine Drug Screen completed in last 360 days.      Passed - Valid encounter within last 6 months    Recent Outpatient Visits          3 weeks ago [place] Scott, Randell Patient, MD   2 months ago Visit for screening mammogram   [facility] [place], Randell Patient, MD   5 months ago Anxiety   [facility] [place], [place], MD   9 months ago Need for 23-polyvalent pneumococcal polysaccharide vaccine   [facility], MD   1 year ago [facility] [place], Randell Patient, MD      Future Appointments            In 2 weeks Einar Pheasant, MD [place], [place]   In 2 months Einar Pheasant, MD [place], [place]   In 3 months [place], Shannon A, [place]   In 8 months O'Brien-Blaney, Bryson Corona, LPN [place], [place]

## 2019-06-03 NOTE — Telephone Encounter (Signed)
Medication Refill - Medication: ALPRAZolam (XANAX) 0.25 MG tablet  Has the patient contacted their pharmacy? No - has had a problem where the pharmacy will not request these for her.  Pt states she only has three pills left. (Agent: If no, request that the patient contact the pharmacy for the refill.) (Agent: If yes, when and what did the pharmacy advise?)  Preferred Pharmacy (with phone number or street name):  [facility] [id number], [place] [address] [place] [phone] (Phone) [phone] (Fax)   Agent: Please be advised that RX refills may take up to 3 business days. We ask that you follow-up with your pharmacy.

## 2019-06-04 DIAGNOSIS — Z23 Encounter for immunization: Secondary | ICD-10-CM | POA: Diagnosis not present

## 2019-06-05 MED ORDER — ALPRAZOLAM 0.25 MG PO TABS: ORAL_TABLET | ORAL | 1 refills | Status: DC

## 2019-06-05 NOTE — Telephone Encounter (Signed)
Pt aware.

## 2019-06-05 NOTE — Telephone Encounter (Signed)
Please call and notify pt that rx has been sent in for pt.

## 2019-06-05 NOTE — Telephone Encounter (Signed)
Pt is calling and would like nurse to call her back today. Pt only has one aprazolam left

## 2019-06-05 NOTE — Telephone Encounter (Signed)
Pt calling states that she needs to speak with a nurse this morning about this refill.  States that she only has one pill left and is very worried about going into the weekend with nothing.  Pt can be reached at [phone]

## 2019-06-18 ENCOUNTER — Other Ambulatory Visit: Payer: Self-pay

## 2019-06-18 ENCOUNTER — Ambulatory Visit (INDEPENDENT_AMBULATORY_CARE_PROVIDER_SITE_OTHER): Payer: Medicare Other | Admitting: Internal Medicine

## 2019-06-18 DIAGNOSIS — F419 Anxiety disorder, unspecified: Secondary | ICD-10-CM | POA: Diagnosis not present

## 2019-06-18 DIAGNOSIS — Z9109 Other allergy status, other than to drugs and biological substances: Secondary | ICD-10-CM | POA: Diagnosis not present

## 2019-06-18 DIAGNOSIS — F320 Major depressive disorder, single episode, mild: Secondary | ICD-10-CM | POA: Diagnosis not present

## 2019-06-18 DIAGNOSIS — K219 Gastro-esophageal reflux disease without esophagitis: Secondary | ICD-10-CM

## 2019-06-18 DIAGNOSIS — F32A Depression, unspecified: Secondary | ICD-10-CM

## 2019-06-18 NOTE — Progress Notes (Signed)
Patient ID: Brandi Ramos, female   DOB: [birth year], 73 y.o.   MRN: [id number]   Virtual Visit via video Note  This visit type was conducted due to national recommendations for restrictions regarding the COVID-19 pandemic (e.g. social distancing).  This format is felt to be most appropriate for this patient at this time.  All issues noted in this document were discussed and addressed.  No physical exam was performed (except for noted visual exam findings with Video Visits).   I connected with Yolanda Bonine by a video enabled telemedicine application and verified that I am speaking with the correct person using two identifiers. Location patient: home Location provider: work Persons participating in the virtual visit: patient, provider  I discussed the limitations, risks, security and privacy concerns of performing an evaluation and management service by video and the availability of in person appointments.  The patient expressed understanding and agreed to proceed.   Reason for visit: scheduled follow up.   HPI: Last visit, increased anxiety.  Started on buspar.  Feels is helping.     ROS: See pertinent positives and negatives per HPI.  Past Medical History:  Diagnosis Date  . Anxiety   . Arthritis   . Chicken pox   . Depression   . Diverticulosis   . Fibrocystic breast disease   . Frozen shoulder   . Fundic gland polyps of stomach, benign   . GERD (gastroesophageal reflux disease)   . History of diverticulitis of colon   . Migraine headache   . Neutropenia ([place])    worked up - Dr Oliva Bustard, slightly elevated ANA  . PUD (peptic ulcer disease)   . Recurrent vaginitis   . Seasonal allergies   . 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     Past Surgical History:  Procedure Laterality Date  . ABDOMINAL HYSTERECTOMY  1984  . COLONOSCOPY    . COLONOSCOPY WITH PROPOFOL N/A 09/26/2018   Procedure: COLONOSCOPY WITH PROPOFOL;  Surgeon: Lollie Sails,  MD;  Location: [facility] ENDOSCOPY;  Service: Endoscopy;  Laterality: N/A;  . ESOPHAGOGASTRODUODENOSCOPY (EGD) WITH PROPOFOL N/A 10/31/2015   Procedure: ESOPHAGOGASTRODUODENOSCOPY (EGD) WITH PROPOFOL;  Surgeon: Lollie Sails, MD;  Location: [facility] ENDOSCOPY;  Service: Endoscopy;  Laterality: N/A;  . ESOPHAGOGASTRODUODENOSCOPY (EGD) WITH PROPOFOL N/A 09/26/2018   Procedure: ESOPHAGOGASTRODUODENOSCOPY (EGD) WITH PROPOFOL;  Surgeon: Lollie Sails, MD;  Location: [facility] ENDOSCOPY;  Service: Endoscopy;  Laterality: N/A;  . LAPAROSCOPIC APPENDECTOMY N/A 10/29/2018   Procedure: APPENDECTOMY LAPAROSCOPIC;  Surgeon: Herbert Pun, MD;  Location: [facility] ORS;  Service: General;  Laterality: N/A;  . LAPAROSCOPIC BILATERAL SALPINGO OOPHERECTOMY Bilateral 10/29/2018   Procedure: LAPAROSCOPIC BILATERAL SALPINGO OOPHORECTOMY;  Surgeon: Ward, Honor Loh, MD;  Location: [facility] ORS;  Service: Gynecology;  Laterality: Bilateral;    Family History  Problem Relation Age of Onset  . CAD Mother        s/p CABG  . Hypercholesterolemia Mother   . Breast cancer Mother 69  . Heart disease Mother   . Lung cancer Mother   . Hypertension Father   . Migraines Father   . Hypercholesterolemia Father   . Lung cancer Father   . Migraines Sister   . Arthritis Sister   . Lymphoma Other        grandmother  . Colon cancer Neg Hx     SOCIAL HX: reviewed.    Current Outpatient Medications:  .  AIMOVIG 140 DOSE 70 MG/ML SOAJ, INJ 1  SYRINGE UTD Q 4 WKS, Disp: , Rfl: 11 .  ALPRAZolam (XANAX) 0.25 MG tablet, TAKE 1 TABLET BY MOUTH EVERY DAY AS NEEDED, Disp: 30 tablet, Rfl: 1 .  busPIRone (BUSPAR) 5 MG tablet, Take 1 tablet (5 mg total) by mouth daily. Take in the am, Disp: 30 tablet, Rfl: 1 .  DULoxetine (CYMBALTA) 60 MG capsule, Take 1 capsule (60 mg total) by mouth daily., Disp: 90 capsule, Rfl: 0 .  fluticasone (FLONASE) 50 MCG/ACT nasal spray, SHAKE LIQUID AND USE 2 SPRAYS IN EACH NOSTRIL DAILY, Disp: 16 g, Rfl: 11 .   LINZESS 145 MCG CAPS capsule, Take 145 mcg by mouth daily., Disp: , Rfl: 3 .  loratadine (CLARITIN) 10 MG tablet, Take 10 mg by mouth daily., Disp: , Rfl:  .  meloxicam (MOBIC) 15 MG tablet, Take 15 mg by mouth daily., Disp: , Rfl:  .  montelukast (SINGULAIR) 10 MG tablet, TK 1 T PO D, Disp: , Rfl:  .  propranolol ER (INDERAL LA) 60 MG 24 hr capsule, TK 1 C PO D, Disp: , Rfl:  .  RABEprazole (ACIPHEX) 20 MG tablet, Take 1 tablet (20 mg total) by mouth daily., Disp: 90 tablet, Rfl: 1 .  rizatriptan (MAXALT) 10 MG tablet, Take 10 mg by mouth as needed. May repeat in 2 hours if needed, Disp: , Rfl:  .  sucralfate (CARAFATE) 1 g tablet, Take 1 tablet (1 g total) by mouth 2 (two) times daily as needed. (Patient taking differently: Take 1 g by mouth 2 (two) times daily. ), Disp: 60 tablet, Rfl: 1 .  SUMAtriptan (IMITREX) 100 MG tablet, , Disp: , Rfl:  .  topiramate (TOPAMAX) 25 MG tablet, TK 1 TO 2 TS PO HS, Disp: , Rfl: 3 .  triamcinolone ointment (KENALOG) 0.5 %, Apply 1 application topically 2 (two) times daily., Disp: 30 g, Rfl: 0  EXAM:  VITALS per patient if applicable:  GENERAL: alert, oriented, appears well and in no acute distress  HEENT: atraumatic, conjunttiva clear, no obvious abnormalities on inspection of external nose and ears  NECK: normal movements of the head and neck  LUNGS: on inspection no signs of respiratory distress, breathing rate appears normal, no obvious gross SOB, gasping or wheezing  CV: no obvious cyanosis  MS: moves all visible extremities without noticeable abnormality  PSYCH/NEURO: pleasant and cooperative, no obvious depression or anxiety, speech and thought processing grossly intact  ASSESSMENT AND PLAN:  Discussed the following assessment and plan:  Anxiety Increased stress and anxiety.  Doing better on buspar.  Discussed with her today.  She does not feel needs anything more at this time.  Follow.    Environmental allergies Controlled.    GERD  (gastroesophageal reflux disease) Controlled on current regimen.  Follow.    Mild depression ([place]) Doing better.  Continue current medication regimen.  Follow.      I discussed the assessment and treatment plan with the patient. The patient was provided an opportunity to ask questions and all were answered. The patient agreed with the plan and demonstrated an understanding of the instructions.   The patient was advised to call back or seek an in-person evaluation if the symptoms worsen or if the condition fails to improve as anticipated.   Einar Pheasant, MD

## 2019-06-21 ENCOUNTER — Encounter: Payer: Self-pay | Admitting: Internal Medicine

## 2019-06-21 NOTE — Assessment & Plan Note (Signed)
Increased stress and anxiety.  Doing better on buspar.  Discussed with her today.  She does not feel needs anything more at this time.  Follow.

## 2019-06-21 NOTE — Assessment & Plan Note (Signed)
Controlled on current regimen.  Follow.  

## 2019-06-21 NOTE — Assessment & Plan Note (Signed)
Doing better.  Continue current medication regimen.  Follow.

## 2019-06-21 NOTE — Assessment & Plan Note (Signed)
Controlled.  

## 2019-06-26 DIAGNOSIS — R52 Pain, unspecified: Secondary | ICD-10-CM | POA: Diagnosis not present

## 2019-06-26 DIAGNOSIS — G43719 Chronic migraine without aura, intractable, without status migrainosus: Secondary | ICD-10-CM | POA: Diagnosis not present

## 2019-07-17 ENCOUNTER — Telehealth: Payer: Self-pay | Admitting: *Deleted

## 2019-07-17 ENCOUNTER — Other Ambulatory Visit: Payer: Self-pay

## 2019-07-17 MED ORDER — BUSPIRONE HCL 5 MG PO TABS: 5.0000 mg | ORAL_TABLET | Freq: Every day | ORAL | 2 refills | Status: DC

## 2019-07-17 NOTE — Telephone Encounter (Signed)
Copied from [place] [phone]. Topic: Quick Communication - Rx Refill/Question >> Jul 17, 2019  9:55 AM Carolyn Stare wrote: Medication busPIRone (BUSPAR) 5 MG tablet   pt said she only have 1 pill  Preferred Pharmacy  [facility] [address] [place]  Agent: Please be advised that RX refills may take up to 3 business days. We ask that you follow-up with your pharmacy.

## 2019-07-17 NOTE — Telephone Encounter (Signed)
rx sent in 

## 2019-07-31 ENCOUNTER — Telehealth: Payer: Self-pay

## 2019-07-31 MED ORDER — DULOXETINE HCL 60 MG PO CPEP: 60.0000 mg | ORAL_CAPSULE | Freq: Every day | ORAL | 0 refills | Status: DC

## 2019-07-31 NOTE — Telephone Encounter (Signed)
Copied from [place] [phone]. Topic: Quick Communication - Rx Refill/Question >> Jul 31, 2019 10:04 AM Carolyn Stare wrote: Medication  ALPRAZolam Duanne Moron) 0.25 MG tablet   busPIRone (BUSPAR) 5 MG tablet   DULoxetine (CYMBALTA) 60 MG capsule   Preferred [facility]acy  [facility] [place]  Agent: Please be advised that RX refills may take up to 3 business days. We ask that you follow-up with your pharmacy.

## 2019-07-31 NOTE — Telephone Encounter (Signed)
Buspar was refilled on 07/17/19.  I sent in refill for Cymbalta today.  Pt also requesting refill on Xanax.    Last OV:  06/18/19  Last Refill on Xanax:  06/05/19

## 2019-08-01 MED ORDER — ALPRAZOLAM 0.25 MG PO TABS: ORAL_TABLET | ORAL | 1 refills | Status: DC

## 2019-08-01 NOTE — Telephone Encounter (Signed)
rx sent in for alprazolam #30 with one refill.

## 2019-08-12 ENCOUNTER — Telehealth: Payer: Self-pay

## 2019-08-12 NOTE — Telephone Encounter (Signed)
Lm for pt to call back and give doxy info

## 2019-08-12 NOTE — Telephone Encounter (Signed)
Copied from [place] [phone]. Topic: General - Other >> Aug 12, 2019 10:48 AM Brandi Ramos wrote: Reason for CRM: pt called in to be advised about her apt on 12/7, pt would like to know if it is going to be in office or vv

## 2019-08-17 ENCOUNTER — Encounter: Payer: Self-pay | Admitting: Internal Medicine

## 2019-08-17 ENCOUNTER — Ambulatory Visit (INDEPENDENT_AMBULATORY_CARE_PROVIDER_SITE_OTHER): Payer: Medicare Other | Admitting: Internal Medicine

## 2019-08-17 ENCOUNTER — Other Ambulatory Visit: Payer: Self-pay

## 2019-08-17 VITALS — Wt 117.0 lb

## 2019-08-17 DIAGNOSIS — K5900 Constipation, unspecified: Secondary | ICD-10-CM

## 2019-08-17 DIAGNOSIS — Z9109 Other allergy status, other than to drugs and biological substances: Secondary | ICD-10-CM

## 2019-08-17 DIAGNOSIS — K219 Gastro-esophageal reflux disease without esophagitis: Secondary | ICD-10-CM

## 2019-08-17 DIAGNOSIS — F419 Anxiety disorder, unspecified: Secondary | ICD-10-CM

## 2019-08-17 DIAGNOSIS — F320 Major depressive disorder, single episode, mild: Secondary | ICD-10-CM

## 2019-08-17 DIAGNOSIS — F32A Depression, unspecified: Secondary | ICD-10-CM

## 2019-08-17 MED ORDER — RABEPRAZOLE SODIUM 20 MG PO TBEC: 20.0000 mg | DELAYED_RELEASE_TABLET | Freq: Two times a day (BID) | ORAL | 1 refills | Status: DC

## 2019-08-17 MED ORDER — BUSPIRONE HCL 10 MG PO TABS: 10.0000 mg | ORAL_TABLET | Freq: Every day | ORAL | 2 refills | Status: DC

## 2019-08-17 NOTE — Progress Notes (Signed)
Patient ID: Brandi Ramos, female   DOB: 1946-09-01, 73 y.o.   MRN: [id number]   Virtual Visit via video Note  This visit type was conducted due to national recommendations for restrictions regarding the COVID-19 pandemic (e.g. social distancing).  This format is felt to be most appropriate for this patient at this time.  All issues noted in this document were discussed and addressed.  No physical exam was performed (except for noted visual exam findings with Video Visits).   I connected with Yolanda Bonine by a video enabled telemedicine application and verified that I am speaking with the correct person using two identifiers. Location patient: home Location provider: work  Persons participating in the virtual visit: patient, provider  I discussed the limitations, risks, security and privacy concerns of performing an evaluation and management service by video and the availability of in person appointments.  The patient expressed understanding and agreed to proceed.   Reason for visit: scheduled follow up.   HPI: Increased stress.  Discussed with her today.  Increased anxiety.  Has a history of panic attacks. States she is afraid they will return.  Some depression related her her current relationship.  Has good support.  No suicidal ideations.  Discussed treatment options.  She is on cymbalta.  Taking buspar.  Taking xanax in the evening. Feels needs to stay on xanax for now.  Feels needs to increased buspar to help with anxiety during the day. Discussed psychiatry referral.  She is in agreement.  Tries to stay active.  No chest pain.  No sob.  No acid reflux.  No abdominal pain.  Some constipation. States having bowel movement 2x/week.  Some allergy symptoms.  Taking claritin prn.  Will change to daily for now.  Also add saline nasal spray.  Discussed shingles vaccine.     ROS: See pertinent positives and negatives per HPI.  Past Medical History:  Diagnosis Date  . Anxiety   . Arthritis    . Chicken pox   . Depression   . Diverticulosis   . Fibrocystic breast disease   . Frozen shoulder   . Fundic gland polyps of stomach, benign   . GERD (gastroesophageal reflux disease)   . History of diverticulitis of colon   . Migraine headache   . Neutropenia ([place])    worked up - Dr Oliva Bustard, slightly elevated ANA  . PUD (peptic ulcer disease)   . Recurrent vaginitis   . Seasonal allergies   . 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     Past Surgical History:  Procedure Laterality Date  . ABDOMINAL HYSTERECTOMY  1984  . COLONOSCOPY    . COLONOSCOPY WITH PROPOFOL N/A 09/26/2018   Procedure: COLONOSCOPY WITH PROPOFOL;  Surgeon: Lollie Sails, MD;  Location: [facility] ENDOSCOPY;  Service: Endoscopy;  Laterality: N/A;  . ESOPHAGOGASTRODUODENOSCOPY (EGD) WITH PROPOFOL N/A 10/31/2015   Procedure: ESOPHAGOGASTRODUODENOSCOPY (EGD) WITH PROPOFOL;  Surgeon: Lollie Sails, MD;  Location: [facility] ENDOSCOPY;  Service: Endoscopy;  Laterality: N/A;  . ESOPHAGOGASTRODUODENOSCOPY (EGD) WITH PROPOFOL N/A 09/26/2018   Procedure: ESOPHAGOGASTRODUODENOSCOPY (EGD) WITH PROPOFOL;  Surgeon: Lollie Sails, MD;  Location: [facility] ENDOSCOPY;  Service: Endoscopy;  Laterality: N/A;  . LAPAROSCOPIC APPENDECTOMY N/A 10/29/2018   Procedure: APPENDECTOMY LAPAROSCOPIC;  Surgeon: Herbert Pun, MD;  Location: [facility] ORS;  Service: General;  Laterality: N/A;  . LAPAROSCOPIC BILATERAL SALPINGO OOPHERECTOMY Bilateral 10/29/2018   Procedure: LAPAROSCOPIC BILATERAL SALPINGO OOPHORECTOMY;  Surgeon: Ward, Honor Loh, MD;  Location: [facility] ORS;  Service: Gynecology;  Laterality: Bilateral;    Family History  Problem Relation Age of Onset  . CAD Mother        s/p CABG  . Hypercholesterolemia Mother   . Breast cancer Mother 61  . Heart disease Mother   . Lung cancer Mother   . Hypertension Father   . Migraines Father   . Hypercholesterolemia Father   . Lung cancer Father   . Migraines  Sister   . Arthritis Sister   . Lymphoma Other        grandmother  . Colon cancer Neg Hx     SOCIAL HX: reviewed.    Current Outpatient Medications:  .  AIMOVIG 140 DOSE 70 MG/ML SOAJ, INJ 1 SYRINGE UTD Q 4 WKS, Disp: , Rfl: 11 .  ALPRAZolam (XANAX) 0.25 MG tablet, TAKE 1 TABLET BY MOUTH EVERY DAY AS NEEDED, Disp: 30 tablet, Rfl: 1 .  busPIRone (BUSPAR) 10 MG tablet, Take 1 tablet (10 mg total) by mouth daily., Disp: 30 tablet, Rfl: 2 .  DULoxetine (CYMBALTA) 60 MG capsule, Take 1 capsule (60 mg total) by mouth daily., Disp: 90 capsule, Rfl: 0 .  fluticasone (FLONASE) 50 MCG/ACT nasal spray, SHAKE LIQUID AND USE 2 SPRAYS IN EACH NOSTRIL DAILY, Disp: 16 g, Rfl: 11 .  LINZESS 145 MCG CAPS capsule, Take 145 mcg by mouth daily., Disp: , Rfl: 3 .  loratadine (CLARITIN) 10 MG tablet, Take 10 mg by mouth daily., Disp: , Rfl:  .  meloxicam (MOBIC) 15 MG tablet, Take 15 mg by mouth daily., Disp: , Rfl:  .  montelukast (SINGULAIR) 10 MG tablet, TK 1 T PO D, Disp: , Rfl:  .  propranolol ER (INDERAL LA) 60 MG 24 hr capsule, TK 1 C PO D, Disp: , Rfl:  .  RABEprazole (ACIPHEX) 20 MG tablet, Take 1 tablet (20 mg total) by mouth 2 (two) times daily before a meal., Disp: 180 tablet, Rfl: 1 .  rizatriptan (MAXALT) 10 MG tablet, Take 10 mg by mouth as needed. May repeat in 2 hours if needed, Disp: , Rfl:  .  sucralfate (CARAFATE) 1 g tablet, Take 1 tablet (1 g total) by mouth 2 (two) times daily as needed. (Patient taking differently: Take 1 g by mouth 2 (two) times daily. ), Disp: 60 tablet, Rfl: 1 .  SUMAtriptan (IMITREX) 100 MG tablet, , Disp: , Rfl:  .  topiramate (TOPAMAX) 25 MG tablet, TK 1 TO 2 TS PO HS, Disp: , Rfl: 3 .  triamcinolone ointment (KENALOG) 0.5 %, Apply 1 application topically 2 (two) times daily., Disp: 30 g, Rfl: 0  EXAM:  GENERAL: alert, oriented, appears well and in no acute distress  HEENT: atraumatic, conjunttiva clear, no obvious abnormalities on inspection of external nose  and ears  NECK: normal movements of the head and neck  LUNGS: on inspection no signs of respiratory distress, breathing rate appears normal, no obvious gross SOB, gasping or wheezing  CV: no obvious cyanosis  PSYCH/NEURO: pleasant and cooperative, no obvious depression or anxiety, speech and thought processing grossly intact  ASSESSMENT AND PLAN:  Discussed the following assessment and plan:  Anxiety Increased anxiety and depression as outlined.   Discussed with her today.  Discussed psychiatry referral.  She is in agreement.  Increase buspar to10mg  q day.  Continue cymbalta.  Will continue xanax for now.  F/u with psychiatry.   Constipation Having 2 bowel movements per week.  miralax and stool softener daily.  Follow.    Environmental allergies claritin and nasal spray as directed.  Follow.    GERD (gastroesophageal reflux disease) Controlled on current medication regimen.  Follow.    Mild depression ([place]) Some increased anxiety and depression as outlined.  On cymbalta.  Uses xanax.  Increase buspar to 10mg  q day.  Refer to psychiatry.      I discussed the assessment and treatment plan with the patient. The patient was provided an opportunity to ask questions and all were answered. The patient agreed with the plan and demonstrated an understanding of the instructions.   The patient was advised to call back or seek an in-person evaluation if the symptoms worsen or if the condition fails to improve as anticipated.  I provided 25 minutes of non-face-to-face time during this encounter.   Einar Pheasant, MD

## 2019-08-18 ENCOUNTER — Encounter: Payer: Self-pay | Admitting: Internal Medicine

## 2019-08-18 NOTE — Assessment & Plan Note (Signed)
claritin and nasal spray as directed.  Follow.

## 2019-08-18 NOTE — Assessment & Plan Note (Signed)
Having 2 bowel movements per week.  miralax and stool softener daily.  Follow.

## 2019-08-18 NOTE — Assessment & Plan Note (Signed)
Increased anxiety and depression as outlined.   Discussed with her today.  Discussed psychiatry referral.  She is in agreement.  Increase buspar to10mg  q day.  Continue cymbalta.  Will continue xanax for now.  F/u with psychiatry.

## 2019-08-18 NOTE — Assessment & Plan Note (Signed)
Some increased anxiety and depression as outlined.  On cymbalta.  Uses xanax.  Increase buspar to 10mg  q day.  Refer to psychiatry.

## 2019-08-18 NOTE — Assessment & Plan Note (Signed)
Controlled on current medication regimen.  Follow.   

## 2019-09-14 ENCOUNTER — Other Ambulatory Visit: Payer: Self-pay

## 2019-09-14 DIAGNOSIS — N200 Calculus of kidney: Secondary | ICD-10-CM

## 2019-09-14 NOTE — Progress Notes (Deleted)
09/15/2019 9:09 PM   Brandi Ramos [birth year] [id number]  Referring provider: Einar Pheasant, [id number] [place],  [place] [id number]  No chief complaint on file.   HPI: [name] is a 74 year old female with a nephrolithiasis and a history of hematuria who presents today for a six month follow up.    Nephrolithiasis Contrast CT in 10/2018 noted there is a nonobstructing calculus in the lower pole of the right kidney measuring up to 7 mm on image 47/4. There are probable additional tiny renal calculi. No evidence of ureteral calculus, hydronephrosis or renal mass. The bladder appears unremarkable.  KUB 09/15/2019 ***  History of hematuria (high risk) Non-smoker.  CTU 01/2018 noted adrenal glands normal.  Nonobstructive right nephrolithiasis includes a 3 mm upper pole calculus on image 73/5; an 8 mm in long axis lower pole calculus on image 63/5; and a 4 mm lower pole calculus on image 66/5.  Left-sided renal calculi include a 1-2 mm upper pole nonobstructive calculus and a 1-2 mm lower pole nonobstructive calculus. There is also faint calcification along the left renal capsule on image 60/5 medially.  No hydronephrosis, hydroureter, or other urinary tract calculus identified. No appreciable abnormal urothelial enhancement or significant additional filling defect along the urothelium. Much of the right ureter does not fill with contrast on the delayed images despite prone positioning.  On image 73/12 there is some cortical hypodensity in thinning in the left mid upper kidney adjacent to the spleen. Given the slightly scalloped contour I favor that this is probably from remote prior scarring, and a similar appearance was present on 11/16/2015.  Cysto 02/2018 negative.  UA 09/14/2019 ***  PMH: Past Medical History:  Diagnosis Date  . Anxiety   . Arthritis   . Chicken pox   . Depression   . Diverticulosis   . Fibrocystic breast disease   . Frozen shoulder    . Fundic gland polyps of stomach, benign   . GERD (gastroesophageal reflux disease)   . History of diverticulitis of colon   . Migraine headache   . Neutropenia ([place])    worked up - Dr Oliva Bustard, slightly elevated ANA  . PUD (peptic ulcer disease)   . Recurrent vaginitis   . Seasonal allergies   . 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     Surgical History: Past Surgical History:  Procedure Laterality Date  . ABDOMINAL HYSTERECTOMY  1984  . COLONOSCOPY    . COLONOSCOPY WITH PROPOFOL N/A 09/26/2018   Procedure: COLONOSCOPY WITH PROPOFOL;  Surgeon: Lollie Sails, MD;  Location: [facility] ENDOSCOPY;  Service: Endoscopy;  Laterality: N/A;  . ESOPHAGOGASTRODUODENOSCOPY (EGD) WITH PROPOFOL N/A 10/31/2015   Procedure: ESOPHAGOGASTRODUODENOSCOPY (EGD) WITH PROPOFOL;  Surgeon: Lollie Sails, MD;  Location: [facility] ENDOSCOPY;  Service: Endoscopy;  Laterality: N/A;  . ESOPHAGOGASTRODUODENOSCOPY (EGD) WITH PROPOFOL N/A 09/26/2018   Procedure: ESOPHAGOGASTRODUODENOSCOPY (EGD) WITH PROPOFOL;  Surgeon: Lollie Sails, MD;  Location: [facility] ENDOSCOPY;  Service: Endoscopy;  Laterality: N/A;  . LAPAROSCOPIC APPENDECTOMY N/A 10/29/2018   Procedure: APPENDECTOMY LAPAROSCOPIC;  Surgeon: Herbert Pun, MD;  Location: [facility] ORS;  Service: General;  Laterality: N/A;  . LAPAROSCOPIC BILATERAL SALPINGO OOPHERECTOMY Bilateral 10/29/2018   Procedure: LAPAROSCOPIC BILATERAL SALPINGO OOPHORECTOMY;  Surgeon: Ward, Honor Loh, MD;  Location: [facility] ORS;  Service: Gynecology;  Laterality: Bilateral;    Home Medications:  Allergies as of 09/15/2019      Reactions   Amitiza [  lubiprostone] Other (See Comments)   Sulfa Antibiotics       Medication List       Accurate as of September 14, 2019  9:09 PM. If you have any questions, ask your nurse or doctor.        Aimovig (140 MG Dose) 70 MG/ML Soaj Generic drug: Erenumab-aooe INJ 1 SYRINGE UTD Q 4 WKS   ALPRAZolam 0.25 MG  tablet Commonly known as: XANAX TAKE 1 TABLET BY MOUTH EVERY DAY AS NEEDED   busPIRone 10 MG tablet Commonly known as: BUSPAR Take 1 tablet (10 mg total) by mouth daily.   DULoxetine 60 MG capsule Commonly known as: CYMBALTA Take 1 capsule (60 mg total) by mouth daily.   fluticasone 50 MCG/ACT nasal spray Commonly known as: FLONASE SHAKE LIQUID AND USE 2 SPRAYS IN EACH NOSTRIL DAILY   Linzess 145 MCG Caps capsule Generic drug: linaclotide Take 145 mcg by mouth daily.   loratadine 10 MG tablet Commonly known as: CLARITIN Take 10 mg by mouth daily.   meloxicam 15 MG tablet Commonly known as: MOBIC Take 15 mg by mouth daily.   montelukast 10 MG tablet Commonly known as: SINGULAIR TK 1 T PO D   propranolol ER 60 MG 24 hr capsule Commonly known as: INDERAL LA TK 1 C PO D   RABEprazole 20 MG tablet Commonly known as: ACIPHEX Take 1 tablet (20 mg total) by mouth 2 (two) times daily before a meal.   rizatriptan 10 MG tablet Commonly known as: MAXALT Take 10 mg by mouth as needed. May repeat in 2 hours if needed   sucralfate 1 g tablet Commonly known as: CARAFATE Take 1 tablet (1 g total) by mouth 2 (two) times daily as needed. What changed: when to take this   SUMAtriptan 100 MG tablet Commonly known as: IMITREX   topiramate 25 MG tablet Commonly known as: TOPAMAX TK 1 TO 2 TS PO HS   triamcinolone ointment 0.5 % Commonly known as: KENALOG Apply 1 application topically 2 (two) times daily.       Allergies:  Allergies  Allergen Reactions  . Amitiza [Lubiprostone] Other (See Comments)  . Sulfa Antibiotics     Family History: Family History  Problem Relation Age of Onset  . CAD Mother        s/p CABG  . Hypercholesterolemia Mother   . Breast cancer Mother 30  . Heart disease Mother   . Lung cancer Mother   . Hypertension Father   . Migraines Father   . Hypercholesterolemia Father   . Lung cancer Father   . Migraines Sister   . Arthritis Sister    . Lymphoma Other        grandmother  . Colon cancer Neg Hx     Social History:  reports that she has never smoked. She has never used smokeless tobacco. She reports current alcohol use. She reports that she does not use drugs.  ROS:                                        Physical Exam: There were no vitals taken for this visit.  Constitutional:  Well nourished. Alert and oriented, No acute distress. HEENT: [place] AT, moist mucus membranes.  Trachea midline, no masses. Cardiovascular: No clubbing, cyanosis, or edema. Respiratory: Normal respiratory effort, no increased work of breathing. GI: Abdomen is soft, non tender, non  distended, no abdominal masses. Liver and spleen not palpable.  No hernias appreciated.  Stool sample for occult testing is not indicated.   GU: No CVA tenderness.  No bladder fullness or masses.  *** external genitalia, *** pubic hair distribution, no lesions.  Normal urethral meatus, no lesions, no prolapse, no discharge.   No urethral masses, tenderness and/or tenderness. No bladder fullness, tenderness or masses. *** vagina mucosa, *** estrogen effect, no discharge, no lesions, *** pelvic support, *** cystocele and *** rectocele noted.  No cervical motion tenderness.  Uterus is freely mobile and non-fixed.  No adnexal/parametria masses or tenderness noted.  Anus and perineum are without rashes or lesions.   ***  Skin: No rashes, bruises or suspicious lesions. Lymph: No cervical or inguinal adenopathy. Neurologic: Grossly intact, no focal deficits, moving all 4 extremities. Psychiatric: Normal mood and affect.   Laboratory Data: Lab Results  Component Value Date   WBC 4.3 05/13/2019   HGB 12.8 05/13/2019   HCT 38.3 05/13/2019   MCV 89.1 05/13/2019   PLT 178.0 05/13/2019    Lab Results  Component Value Date   CREATININE 0.90 05/13/2019    No results found for: PSA  No results found for: TESTOSTERONE  Lab Results  Component Value  Date   HGBA1C 5.5 01/15/2018    Lab Results  Component Value Date   TSH 3.35 05/13/2019       Component Value Date/Time   CHOL 185 05/13/2019 0815   HDL 59.30 05/13/2019 0815   CHOLHDL 3 05/13/2019 0815   VLDL 20.0 05/13/2019 0815   LDLCALC 105 (H) 05/13/2019 0815    Lab Results  Component Value Date   AST 21 05/13/2019   Lab Results  Component Value Date   ALT 8 05/13/2019   No components found for: ALKALINEPHOPHATASE No components found for: BILIRUBINTOTAL  No results found for: ESTRADIOL  Urinalysis *** I have reviewed the labs.   Pertinent Imaging: ***  I have independently reviewed the films ***  Assessment & Plan:    1. Right nephrolithiasis Explained that the right renal stone is not the cause of her current symptoms and it is likely due to her recovering from the surgery Will plan for follow up in 6 months for KUB Patient is advised that if they should start to experience pain that is not able to be controlled with pain medication, intractable nausea and/or vomiting and/or fevers greater than 103 or shaking chills to contact the office immediately or seek treatment in the emergency department for emergent intervention.    History of hematuria Hematuria work up completed in 02/2018 - findings positive for bilateral nephrolithiasis No report of gross hematuria  UA today negative for [facility] RTC in one year for UA - patient to report any gross hematuria in the interim    No follow-ups on file.  These notes generated with voice recognition software. I apologize for typographical errors.  Zara Council, PA-C  [facility] [address]  [place], [place] [address] [phone]

## 2019-09-15 ENCOUNTER — Encounter: Payer: Self-pay | Admitting: Urology

## 2019-09-15 ENCOUNTER — Ambulatory Visit: Payer: Medicare Other | Admitting: Urology

## 2019-09-30 ENCOUNTER — Telehealth: Payer: Self-pay | Admitting: Internal Medicine

## 2019-09-30 MED ORDER — ALPRAZOLAM 0.25 MG PO TABS: ORAL_TABLET | ORAL | 1 refills | Status: DC

## 2019-09-30 NOTE — Telephone Encounter (Signed)
rx ok'd for xanax #30 with one refill.  Sent in rx.

## 2019-09-30 NOTE — Telephone Encounter (Signed)
Pt needs refill on ALPRAZolam (XANAX) 0.25 MG tablet

## 2019-09-30 NOTE — Telephone Encounter (Signed)
Last OV 08/17/19 Next OV 10/13/19  Last refill 08/01/19

## 2019-10-13 ENCOUNTER — Telehealth: Payer: Self-pay | Admitting: Internal Medicine

## 2019-10-13 ENCOUNTER — Encounter: Payer: Self-pay | Admitting: Internal Medicine

## 2019-10-13 ENCOUNTER — Ambulatory Visit (INDEPENDENT_AMBULATORY_CARE_PROVIDER_SITE_OTHER): Payer: Medicare Other | Admitting: Internal Medicine

## 2019-10-13 ENCOUNTER — Other Ambulatory Visit: Payer: Self-pay

## 2019-10-13 DIAGNOSIS — F419 Anxiety disorder, unspecified: Secondary | ICD-10-CM

## 2019-10-13 DIAGNOSIS — R319 Hematuria, unspecified: Secondary | ICD-10-CM

## 2019-10-13 DIAGNOSIS — D72819 Decreased white blood cell count, unspecified: Secondary | ICD-10-CM

## 2019-10-13 DIAGNOSIS — K5900 Constipation, unspecified: Secondary | ICD-10-CM

## 2019-10-13 DIAGNOSIS — Z9109 Other allergy status, other than to drugs and biological substances: Secondary | ICD-10-CM

## 2019-10-13 DIAGNOSIS — K219 Gastro-esophageal reflux disease without esophagitis: Secondary | ICD-10-CM | POA: Diagnosis not present

## 2019-10-13 DIAGNOSIS — G43709 Chronic migraine without aura, not intractable, without status migrainosus: Secondary | ICD-10-CM

## 2019-10-13 DIAGNOSIS — F32A Depression, unspecified: Secondary | ICD-10-CM

## 2019-10-13 DIAGNOSIS — F320 Major depressive disorder, single episode, mild: Secondary | ICD-10-CM

## 2019-10-13 NOTE — Telephone Encounter (Signed)
LMTCB and schedule a yearly follow up in 61m

## 2019-10-13 NOTE — Progress Notes (Signed)
Patient ID: Brandi Ramos, female   DOB: Dec 10, 1945, 74 y.o.   MRN: [id number]   Virtual Visit via video Note  This visit type was conducted due to national recommendations for restrictions regarding the COVID-19 pandemic (e.g. social distancing).  This format is felt to be most appropriate for this patient at this time.  All issues noted in this document were discussed and addressed.  No physical exam was performed (except for noted visual exam findings with Video Visits).   I connected with Yolanda Bonine by a video enabled telemedicine application or telephone and verified that I am speaking with the correct person using two identifiers. Location patient: home Location provider: work Persons participating in the virtual visit: patient, provider  The limitations, risks, security and privacy concerns of performing an evaluation and management service by video and the availability of in person appointments have been discussed. The patient expressed understanding and agreed to proceed.   Reason for visit: scheduled follow up.   HPI: She reports she is doing some better.  Still with increased stress. Mother passed away.  Discussed with her today.  Taking 10mg  buspar.  On cymbalta.  Has goo support.  Does not feel needs any further intervention.  Had left shoulder pain.  Saw ortho 08/27/19.  S/p injection.  Tries to stay active.  No chest pain or sob.  No acid reflux.  No abdominal pain reported.  Discussed stool softener  - for constipation.  Eating.    ROS: See pertinent positives and negatives per HPI.  Past Medical History:  Diagnosis Date  . Anxiety   . Arthritis   . Chicken pox   . Depression   . Diverticulosis   . Fibrocystic breast disease   . Frozen shoulder   . Fundic gland polyps of stomach, benign   . GERD (gastroesophageal reflux disease)   . History of diverticulitis of colon   . Migraine headache   . Neutropenia ([place])    worked up - Dr Oliva Bustard, slightly elevated ANA    . PUD (peptic ulcer disease)   . Recurrent vaginitis   . Seasonal allergies   . 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     Past Surgical History:  Procedure Laterality Date  . ABDOMINAL HYSTERECTOMY  1984  . COLONOSCOPY    . COLONOSCOPY WITH PROPOFOL N/A 09/26/2018   Procedure: COLONOSCOPY WITH PROPOFOL;  Surgeon: Lollie Sails, MD;  Location: [facility] ENDOSCOPY;  Service: Endoscopy;  Laterality: N/A;  . ESOPHAGOGASTRODUODENOSCOPY (EGD) WITH PROPOFOL N/A 10/31/2015   Procedure: ESOPHAGOGASTRODUODENOSCOPY (EGD) WITH PROPOFOL;  Surgeon: Lollie Sails, MD;  Location[facility] ENDOSCOPY;  Service: Endoscopy;  Laterality: N/A;  . ESOPHAGOGASTRODUODENOSCOPY (EGD) WITH PROPOFOL N/A 09/26/2018   Procedure: ESOPHAGOGASTRODUODENOSCOPY (EGD) WITH PROPOFOL;  Surgeon: Lollie Sails, MD;  Location: [facility] ENDOSCOPY;  Service: Endoscopy;  Laterality: N/A;  . LAPAROSCOPIC APPENDECTOMY N/A 10/29/2018   Procedure: APPENDECTOMY LAPAROSCOPIC;  Surgeon: Herbert Pun, MD;  Location: [facility] ORS;  Service: General;  Laterality: N/A;  . LAPAROSCOPIC BILATERAL SALPINGO OOPHERECTOMY Bilateral 10/29/2018   Procedure: LAPAROSCOPIC BILATERAL SALPINGO OOPHORECTOMY;  Surgeon: Ward, Honor Loh, MD;  Location: [facility] ORS;  Service: Gynecology;  Laterality: Bilateral;    Family History  Problem Relation Age of Onset  . CAD Mother        s/p CABG  . Hypercholesterolemia Mother   . Breast cancer Mother 78  . Heart disease Mother   . Lung cancer Mother   .  Hypertension Father   . Migraines Father   . Hypercholesterolemia Father   . Lung cancer Father   . Migraines Sister   . Arthritis Sister   . Lymphoma Other        grandmother  . Colon cancer Neg Hx     SOCIAL HX: reviewed.    Current Outpatient Medications:  .  AIMOVIG 140 DOSE 70 MG/ML SOAJ, INJ 1 SYRINGE UTD Q 4 WKS, Disp: , Rfl: 11 .  ALPRAZolam (XANAX) 0.25 MG tablet, TAKE 1 TABLET BY MOUTH EVERY DAY AS NEEDED, Disp: 30  tablet, Rfl: 1 .  busPIRone (BUSPAR) 10 MG tablet, Take 1 tablet (10 mg total) by mouth daily., Disp: 30 tablet, Rfl: 2 .  DULoxetine (CYMBALTA) 60 MG capsule, Take 1 capsule (60 mg total) by mouth daily., Disp: 90 capsule, Rfl: 0 .  fluticasone (FLONASE) 50 MCG/ACT nasal spray, SHAKE LIQUID AND USE 2 SPRAYS IN EACH NOSTRIL DAILY, Disp: 16 g, Rfl: 11 .  LINZESS 145 MCG CAPS capsule, Take 145 mcg by mouth daily., Disp: , Rfl: 3 .  loratadine (CLARITIN) 10 MG tablet, Take 10 mg by mouth daily., Disp: , Rfl:  .  meloxicam (MOBIC) 15 MG tablet, Take 15 mg by mouth daily., Disp: , Rfl:  .  montelukast (SINGULAIR) 10 MG tablet, TK 1 T PO D, Disp: , Rfl:  .  propranolol ER (INDERAL LA) 60 MG 24 hr capsule, TK 1 C PO D, Disp: , Rfl:  .  RABEprazole (ACIPHEX) 20 MG tablet, Take 1 tablet (20 mg total) by mouth 2 (two) times daily before a meal., Disp: 180 tablet, Rfl: 1 .  rizatriptan (MAXALT) 10 MG tablet, Take 10 mg by mouth as needed. May repeat in 2 hours if needed, Disp: , Rfl:  .  sucralfate (CARAFATE) 1 g tablet, Take 1 tablet (1 g total) by mouth 2 (two) times daily as needed. (Patient taking differently: Take 1 g by mouth 2 (two) times daily. ), Disp: 60 tablet, Rfl: 1 .  SUMAtriptan (IMITREX) 100 MG tablet, , Disp: , Rfl:  .  topiramate (TOPAMAX) 25 MG tablet, TK 1 TO 2 TS PO HS, Disp: , Rfl: 3 .  triamcinolone ointment (KENALOG) 0.5 %, Apply 1 application topically 2 (two) times daily., Disp: 30 g, Rfl: 0 .  promethazine (PHENERGAN) 12.5 MG tablet, , Disp: , Rfl:  .  topiramate (TOPAMAX) 50 MG tablet, TAKE 1 AND 1/2 TABLET BY MOUTH IN MORNING AND 1 TABLET BY MOUTH IN EVENING, Disp: , Rfl:   EXAM:  GENERAL: alert, oriented, appears well and in no acute distress  HEENT: atraumatic, conjunttiva clear, no obvious abnormalities on inspection of external nose and ears  NECK: normal movements of the head and neck  LUNGS: on inspection no signs of respiratory distress, breathing rate appears  normal, no obvious gross SOB, gasping or wheezing  CV: no obvious cyanosis  PSYCH/NEURO: pleasant and cooperative, no obvious depression or anxiety, speech and thought processing grossly intact  ASSESSMENT AND PLAN:  Discussed the following assessment and plan:  Anxiety Increased stress, anxiety and depression as outlined.  Discussed with her today.  On cymbalta and buspar.  Has good support.  Doing some better. Follow.    Constipation Stool softener.  Follow.   Environmental allergies No increased allergy issues reported today.  Follow.    GERD (gastroesophageal reflux disease) Controlled on aciphex.    Hematuria Worked up by urology.  Last evaluated 02/2019 - f/u nephrolithiasis.  Recommended 6  month f/u.    Leukopenia Follow cbc.    Migraine headache Followed by neurology.  Stable.   Mild depression (HCC) Increased anxiety, stress and depression.  On cymbalta and buspar.  Doing some better.  Had placed referral to psych.  Follow.       I discussed the assessment and treatment plan with the patient. The patient was provided an opportunity to ask questions and all were answered. The patient agreed with the plan and demonstrated an understanding of the instructions.   The patient was advised to call back or seek an in-person evaluation if the symptoms worsen or if the condition fails to improve as anticipated.   Einar Pheasant, MD

## 2019-10-18 ENCOUNTER — Encounter: Payer: Self-pay | Admitting: Internal Medicine

## 2019-10-18 ENCOUNTER — Telehealth: Payer: Self-pay | Admitting: Internal Medicine

## 2019-10-18 NOTE — Telephone Encounter (Signed)
Please call pt and ask her if she has urology f/u scheduled.  They saw her 02/2019 - f/u hematuria and kidney stones.  Was due f/u 08/2019.  Also, we had discussed psychiatry referral.  Had placed previous referral.  Is she planning to f/u.

## 2019-10-18 NOTE — Assessment & Plan Note (Signed)
Follow [facility]bc.  

## 2019-10-18 NOTE — Assessment & Plan Note (Signed)
Followed by neurology.  Stable  

## 2019-10-18 NOTE — Assessment & Plan Note (Signed)
No increased allergy issues reported today.  Follow.

## 2019-10-18 NOTE — Assessment & Plan Note (Addendum)
Increased anxiety, stress and depression.  On cymbalta and buspar.  Doing some better.  Had placed referral to psych.  Follow.

## 2019-10-18 NOTE — Assessment & Plan Note (Signed)
Increased stress, anxiety and depression as outlined.  Discussed with her today.  On cymbalta and buspar.  Has good support.  Doing some better. Follow.

## 2019-10-18 NOTE — Assessment & Plan Note (Signed)
Worked up by urology.  Last evaluated 02/2019 - f/u nephrolithiasis.  Recommended 6 month f/u.

## 2019-10-18 NOTE — Assessment & Plan Note (Signed)
Controlled on aciphex.   

## 2019-10-18 NOTE — Assessment & Plan Note (Signed)
Stool softener.  Follow.

## 2019-10-20 ENCOUNTER — Ambulatory Visit
Admission: RE | Admit: 2019-10-20 | Discharge: 2019-10-20 | Disposition: A | Discharge status: Home / Self Care | Payer: Medicare Other | Source: Ambulatory Visit | Attending: Urology | Admitting: Urology

## 2019-10-20 ENCOUNTER — Encounter: Payer: Self-pay | Admitting: Urology

## 2019-10-20 ENCOUNTER — Ambulatory Visit: Payer: Medicare Other | Admitting: Urology

## 2019-10-20 ENCOUNTER — Other Ambulatory Visit: Payer: Self-pay

## 2019-10-20 VITALS — BP 107/65 | HR 85 | Ht 68.0 in | Wt 117.0 lb

## 2019-10-20 DIAGNOSIS — N200 Calculus of kidney: Secondary | ICD-10-CM | POA: Insufficient documentation

## 2019-10-20 DIAGNOSIS — R3129 Other microscopic hematuria: Secondary | ICD-10-CM | POA: Diagnosis not present

## 2019-10-20 LAB — MICROSCOPIC EXAMINATION

## 2019-10-20 LAB — URINALYSIS, COMPLETE
Bilirubin, UA: NEGATIVE
Glucose, UA: NEGATIVE
Ketones, UA: NEGATIVE
Nitrite, UA: NEGATIVE
Protein,UA: NEGATIVE
Specific Gravity, UA: 1.02 (ref 1.005–1.030)
Urobilinogen, Ur: 0.2 mg/dL (ref 0.2–1.0)
pH, UA: 7 (ref 5.0–7.5)

## 2019-10-20 NOTE — Progress Notes (Signed)
10/20/2019 3:18 PM   [name] 12/26/1945 [id number]  Referring provider: Einar Pheasant, [id number],  [place] [address]  Chief Complaint  Patient presents with  . Nephrolithiasis    HPI: Mrs. Hunke is a 74 year old female with a nephrolithiasis and a history of hematuria who presents today for a six month follow up.    Nephrolithiasis Contrast CT in 10/2018 noted there is a nonobstructing calculus in the lower pole of the right kidney measuring up to 7 mm on image 47/4. There are probable additional tiny renal calculi. No evidence of ureteral calculus, hydronephrosis or renal mass. The bladder appears unremarkable.  KUB 03/02/2019 Right nephrolithiasis is noted. No evidence of bowel obstruction or ileus.  KUB 10/20/2019 stable right renal calculus. No evidence of bowel obstruction or ileus.  She is asymptomatic at this visit.  History of hematuria (high risk) Non-smoker.  CTU 01/2018 noted adrenal glands normal.  Nonobstructive right nephrolithiasis includes a 3 mm upper pole calculus on image 73/5; an 8 mm in long axis lower pole calculus on image 63/5; and a 4 mm lower pole calculus on image 66/5.  Left-sided renal calculi include a 1-2 mm upper pole nonobstructive calculus and a 1-2 mm lower pole nonobstructive calculus. There is also faint calcification along the left renal capsule on image 60/5 medially.  No hydronephrosis, hydroureter, or other urinary tract calculus identified. No appreciable abnormal urothelial enhancement or significant additional filling defect along the urothelium. Much of the right ureter does not fill with contrast on the delayed images despite prone positioning.  On image 73/12 there is some cortical hypodensity in thinning in the left mid upper kidney adjacent to the spleen. Given the slightly scalloped contour I favor that this is probably from remote prior scarring, and a similar appearance was present on  11/16/2015.  Cysto 02/2018 with Dr. Erlene Quan was negative.  Contrast CT in 10/2018 Both adrenal glands appear normal. There is a nonobstructing calculus in the lower pole of the right kidney measuring up to 7 mm on image 47/4. There are probable additional tiny renal calculi. No evidence of ureteral calculus, hydronephrosis or renal mass. The bladder appears unremarkable. UA today is positive for 6-10 RBCs per high-power field.  Today, she is having intermittent dysuria and lower back pain that has been occurring for the last 2 weeks.  Patient denies any modifying or aggravating factors.  Patient denies any gross hematuria, dysuria or suprapubic/flank pain.  Patient denies any fevers, chills, nausea or vomiting.  Her UA today is yellow cloudy, specific gravity 1.020, +1 blood, pH 7.0, 2+ leukocytes, 11-30 WBCs per high-power field, 6-10 RBCs per high-power field, 0-10 epithelial cells per high-power field, 0-10 renal epithelial cells per high-power field, calcium oxalate crystals are present and few bacteria.  PMH: Past Medical History:  Diagnosis Date  . Anxiety   . Arthritis   . Chicken pox   . Depression   . Diverticulosis   . Fibrocystic breast disease   . Frozen shoulder   . Fundic gland polyps of stomach, benign   . GERD (gastroesophageal reflux disease)   . History of diverticulitis of colon   . Migraine headache   . Neutropenia ([place])    worked up - Dr Oliva Bustard, slightly elevated ANA  . PUD (peptic ulcer disease)   . Recurrent vaginitis   . Seasonal allergies   . 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  Surgical History: Past Surgical History:  Procedure Laterality Date  . ABDOMINAL HYSTERECTOMY  1984  . COLONOSCOPY    . COLONOSCOPY WITH PROPOFOL N/A 09/26/2018   Procedure: COLONOSCOPY WITH PROPOFOL;  Surgeon: Lollie Sails, MD;  Location: [facility] ENDOSCOPY;  Service: Endoscopy;  Laterality: N/A;  . ESOPHAGOGASTRODUODENOSCOPY (EGD) WITH PROPOFOL N/A  10/31/2015   Procedure: ESOPHAGOGASTRODUODENOSCOPY (EGD) WITH PROPOFOL;  Surgeon: Lollie Sails, MD;  Location: [facility] ENDOSCOPY;  Service: Endoscopy;  Laterality: N/A;  . ESOPHAGOGASTRODUODENOSCOPY (EGD) WITH PROPOFOL N/A 09/26/2018   Procedure: ESOPHAGOGASTRODUODENOSCOPY (EGD) WITH PROPOFOL;  Surgeon: Lollie Sails, MD;  Location: [facility] ENDOSCOPY;  Service: Endoscopy;  Laterality: N/A;  . LAPAROSCOPIC APPENDECTOMY N/A 10/29/2018   Procedure: APPENDECTOMY LAPAROSCOPIC;  Surgeon: Herbert Pun, MD;  Location: [facility] ORS;  Service: General;  Laterality: N/A;  . LAPAROSCOPIC BILATERAL SALPINGO OOPHERECTOMY Bilateral 10/29/2018   Procedure: LAPAROSCOPIC BILATERAL SALPINGO OOPHORECTOMY;  Surgeon: Ward, Honor Loh, MD;  Location: [facility] ORS;  Service: Gynecology;  Laterality: Bilateral;    Home Medications:  Allergies as of 10/20/2019      Reactions   Amitiza [lubiprostone] Other (See Comments)   Sulfa Antibiotics       Medication List       Accurate as of October 20, 2019  3:18 PM. If you have any questions, ask your nurse or doctor.        Aimovig (140 MG Dose) 70 MG/ML Soaj Generic drug: Erenumab-aooe INJ 1 SYRINGE UTD Q 4 WKS   ALPRAZolam 0.25 MG tablet Commonly known as: XANAX TAKE 1 TABLET BY MOUTH EVERY DAY AS NEEDED   busPIRone 10 MG tablet Commonly known as: BUSPAR Take 1 tablet (10 mg total) by mouth daily.   DULoxetine 60 MG capsule Commonly known as: CYMBALTA Take 1 capsule (60 mg total) by mouth daily.   fluticasone 50 MCG/ACT nasal spray Commonly known as: FLONASE SHAKE LIQUID AND USE 2 SPRAYS IN EACH NOSTRIL DAILY   Linzess 145 MCG Caps capsule Generic drug: linaclotide Take 145 mcg by mouth daily.   loratadine 10 MG tablet Commonly known as: CLARITIN Take 10 mg by mouth daily.   meloxicam 15 MG tablet Commonly known as: MOBIC Take 15 mg by mouth daily.   montelukast 10 MG tablet Commonly known as: SINGULAIR TK 1 T PO D   promethazine 12.5 MG  tablet Commonly known as: PHENERGAN   propranolol ER 60 MG 24 hr capsule Commonly known as: INDERAL LA TK 1 C PO D   RABEprazole 20 MG tablet Commonly known as: ACIPHEX Take 1 tablet (20 mg total) by mouth 2 (two) times daily before a meal.   rizatriptan 10 MG tablet Commonly known as: MAXALT Take 10 mg by mouth as needed. May repeat in 2 hours if needed   sucralfate 1 g tablet Commonly known as: CARAFATE Take 1 tablet (1 g total) by mouth 2 (two) times daily as needed. What changed: when to take this   SUMAtriptan 100 MG tablet Commonly known as: IMITREX   topiramate 25 MG tablet Commonly known as: TOPAMAX TK 1 TO 2 TS PO HS   topiramate 50 MG tablet Commonly known as: TOPAMAX TAKE 1 AND 1/2 TABLET BY MOUTH IN MORNING AND 1 TABLET BY MOUTH IN EVENING   triamcinolone ointment 0.5 % Commonly known as: KENALOG Apply 1 application topically 2 (two) times daily.       Allergies:  Allergies  Allergen Reactions  . Amitiza [Lubiprostone] Other (See Comments)  . Sulfa Antibiotics  Family History: Family History  Problem Relation Age of Onset  . CAD Mother        s/p CABG  . Hypercholesterolemia Mother   . Breast cancer Mother 47  . Heart disease Mother   . Lung cancer Mother   . Hypertension Father   . Migraines Father   . Hypercholesterolemia Father   . Lung cancer Father   . Migraines Sister   . Arthritis Sister   . Lymphoma Other        grandmother  . Colon cancer Neg Hx     Social History:  reports that she has never smoked. She has never used smokeless tobacco. She reports current alcohol use. She reports that she does not use drugs.  ROS: UROLOGY Frequent Urination?: No Hard to postpone urination?: No Burning/pain with urination?: No Get up at night to urinate?: No Leakage of urine?: No Urine stream starts and stops?: No Trouble starting stream?: No Do you have to strain to urinate?: No Blood in urine?: No Urinary tract infection?: No  Sexually transmitted disease?: No Injury to kidneys or bladder?: No Painful intercourse?: No Weak stream?: No Currently pregnant?: No Vaginal bleeding?: No Last menstrual period?: n  Gastrointestinal Nausea?: No Vomiting?: No Indigestion/heartburn?: No Diarrhea?: No Constipation?: Yes  Constitutional Fever: No Night sweats?: No Weight loss?: No Fatigue?: No  Skin Skin rash/lesions?: No Itching?: No  Eyes Blurred vision?: No Double vision?: No  Ears/Nose/Throat Sore throat?: No Sinus problems?: No  Hematologic/Lymphatic Swollen glands?: No Easy bruising?: No  Cardiovascular Leg swelling?: No Chest pain?: No  Respiratory Cough?: No Shortness of breath?: No  Endocrine Excessive thirst?: No  Musculoskeletal Back pain?: No Joint pain?: No  Neurological Headaches?: No Dizziness?: No  Psychologic Depression?: No Anxiety?: No  Physical Exam: BP 107/65   Pulse 85   Ht 5\' 8"  (1.727 m)   Wt 117 lb (53.1 kg)   BMI 17.79 kg/m   Constitutional:  Well nourished. Alert and oriented, No acute distress. HEENT: [place] AT, mask in place.  Trachea midline, no masses. Cardiovascular: No clubbing, cyanosis, or edema. Respiratory: Normal respiratory effort, no increased work of breathing. Neurologic: Grossly intact, no focal deficits, moving all 4 extremities. Psychiatric: Normal mood and affect.   Laboratory Data: Lab Results  Component Value Date   WBC 4.3 05/13/2019   HGB 12.8 05/13/2019   HCT 38.3 05/13/2019   MCV 89.1 05/13/2019   PLT 178.0 05/13/2019    Lab Results  Component Value Date   CREATININE 0.90 05/13/2019    Lab Results  Component Value Date   HGBA1C 5.5 01/15/2018    Lab Results  Component Value Date   TSH 3.35 05/13/2019       Component Value Date/Time   CHOL 185 05/13/2019 0815   HDL 59.30 05/13/2019 0815   CHOLHDL 3 05/13/2019 0815   VLDL 20.0 05/13/2019 0815   LDLCALC 105 (H) 05/13/2019 0815    Lab Results   Component Value Date   AST 21 05/13/2019   Lab Results  Component Value Date   ALT 8 05/13/2019    Urinalysis Component     Latest Ref Rng & Units 10/20/2019  Specific Gravity, UA     1.005 - 1.030 1.020  pH, UA     5.0 - 7.5 7.0  Color, UA     Yellow Yellow  Appearance Ur     Clear Cloudy (A)  Leukocytes,UA     Negative 2+ (A)  Protein,UA  Negative/Trace Negative  Glucose, UA     Negative Negative  Ketones, UA     Negative Negative  RBC, UA     Negative 1+ (A)  Bilirubin, UA     Negative Negative  Urobilinogen, Ur     0.2 - 1.0 mg/dL 0.2  Nitrite, UA     Negative Negative  Microscopic Examination      See below:   Component     Latest Ref Rng & Units 10/20/2019  WBC, UA     0 - 5 /hpf 11-30 (A)  RBC     0 - 2 /hpf 3-10 (A)  Epithelial Cells (non renal)     0 - 10 /hpf 0-10  Renal Epithel, UA     None seen /hpf 0-10 (A)  Crystals     N/A Present (A)  Crystal Type     N/A Calcium Oxalate  Bacteria, UA     None seen/Few Few   I have reviewed the labs.   Pertinent Imaging: CLINICAL DATA:  Kidney stones.  EXAM: ABDOMEN - 1 VIEW  COMPARISON:  March 02, 2019.  FINDINGS: The bowel gas pattern is normal. Stable right renal calculus is noted.  IMPRESSION: Stable right renal calculus. No evidence of bowel obstruction or ileus.   Electronically Signed   By: Marijo Conception M.D.   On: 10/20/2019 14:25 I have independently reviewed the films and note the right renal calculus.   Assessment & Plan:    1. Right nephrolithiasis KUB stable right renal calculus Asymptomatic at this visit Will plan for follow up in 6 months for KUB Patient is advised that if they should start to experience pain that is not able to be controlled with pain medication, intractable nausea and/or vomiting and/or fevers greater than 103 or shaking chills to contact the office immediately or seek treatment in the emergency department for emergent intervention.    2.  History of hematuria Hematuria work up completed in 02/2018 - findings positive for bilateral nephrolithiasis No report of gross hematuria  UA today negative positive for 6-10 RBC's -but will send for culture to rule out UTI If urine culture is negative we will pursue renal ultrasound and cystoscopy, if urine culture is positive for infection will treat with antibiotic and then recheck the urine to ensure the microscopic hematuria clears with the treatment of the infection   Return for pending urine culture .  These notes generated with voice recognition software. I apologize for typographical errors.  Zara Council, PA-C  [facility] [address]  [place], [place] [address] [phone]

## 2019-10-20 NOTE — Telephone Encounter (Signed)
Pt saw urology today. LMTCB about psychiatry

## 2019-10-24 LAB — CULTURE, URINE COMPREHENSIVE

## 2019-10-26 ENCOUNTER — Other Ambulatory Visit: Payer: Self-pay | Admitting: Urology

## 2019-10-26 ENCOUNTER — Telehealth: Payer: Self-pay | Admitting: Urology

## 2019-10-26 ENCOUNTER — Telehealth: Payer: Self-pay | Admitting: Family Medicine

## 2019-10-26 DIAGNOSIS — R3129 Other microscopic hematuria: Secondary | ICD-10-CM

## 2019-10-26 NOTE — Telephone Encounter (Signed)
-----   Message from Nori Riis, PA-C sent at 10/26/2019  8:23 AM EST ----- Please let Mr. Boehringer know that her urine culture is negative, so I recommend that she have the RUS and cystoscopy as we dicussed.  I have put in the orders for the RUS.

## 2019-10-26 NOTE — Telephone Encounter (Signed)
Pt asking for results, Please advise. Thanks.

## 2019-10-26 NOTE — Telephone Encounter (Signed)
Patient notified and voiced understanding. Appointment for Cysto has been scheduled.

## 2019-10-30 ENCOUNTER — Other Ambulatory Visit: Payer: Self-pay

## 2019-10-30 ENCOUNTER — Telehealth: Payer: Self-pay | Admitting: Internal Medicine

## 2019-10-30 MED ORDER — DULOXETINE HCL 60 MG PO CPEP: 60.0000 mg | ORAL_CAPSULE | Freq: Every day | ORAL | 0 refills | Status: DC

## 2019-10-30 NOTE — Telephone Encounter (Signed)
Medication sent to pharmacy  

## 2019-10-30 NOTE — Telephone Encounter (Signed)
DULoxetine (CYMBALTA) 60 MG capsule Refill for 90 days. Patient called it into her pharmacy on Wednesday 2.17.21.

## 2019-11-03 ENCOUNTER — Other Ambulatory Visit: Payer: Self-pay

## 2019-11-03 ENCOUNTER — Ambulatory Visit
Admission: RE | Admit: 2019-11-03 | Discharge: 2019-11-03 | Disposition: A | Discharge status: Home / Self Care | Payer: Medicare Other | Source: Ambulatory Visit | Attending: Urology | Admitting: Urology

## 2019-11-03 DIAGNOSIS — R3129 Other microscopic hematuria: Secondary | ICD-10-CM | POA: Diagnosis present

## 2019-11-13 ENCOUNTER — Other Ambulatory Visit: Payer: Self-pay

## 2019-11-13 ENCOUNTER — Encounter: Payer: Self-pay | Admitting: Urology

## 2019-11-13 ENCOUNTER — Ambulatory Visit: Payer: Medicare Other | Admitting: Urology

## 2019-11-13 VITALS — BP 118/70 | HR 60 | Ht 64.0 in | Wt 118.0 lb

## 2019-11-13 DIAGNOSIS — N200 Calculus of kidney: Secondary | ICD-10-CM

## 2019-11-13 DIAGNOSIS — R3129 Other microscopic hematuria: Secondary | ICD-10-CM | POA: Diagnosis not present

## 2019-11-13 LAB — URINALYSIS, COMPLETE
Bilirubin, UA: NEGATIVE
Glucose, UA: NEGATIVE
Ketones, UA: NEGATIVE
Nitrite, UA: NEGATIVE
Protein,UA: NEGATIVE
Specific Gravity, UA: 1.025 (ref 1.005–1.030)
Urobilinogen, Ur: 0.2 mg/dL (ref 0.2–1.0)
pH, UA: 6 (ref 5.0–7.5)

## 2019-11-13 LAB — MICROSCOPIC EXAMINATION

## 2019-11-13 NOTE — Progress Notes (Signed)
   11/13/19  CC:  Chief Complaint  Patient presents with  . Cysto    HPI: Refer to Shannon's note of 10/20/2019.  RUS showed a 7 mm left lower pole renal calculus.  No masses or hydronephrosis.  Urinalysis on 2/9 showed primarily pyuria with urine culture growing mixed flora.  She has no complaints today.  UA with 11-30 WBC/3-10 RBC, nitrite negative  Blood pressure 118/70, pulse 60, height 5\' 4"  (1.626 m), weight 118 lb (53.5 kg). Atrophic external genitalia with patent urethral meatus  Cystoscopy Procedure Note  Patient identification was confirmed, informed consent was obtained, and patient was prepped using Betadine solution.  Lidocaine jelly was administered per urethral meatus.    Procedure: - Flexible cystoscope introduced, without any difficulty.   - Thorough search of the bladder revealed:    normal urethral meatus    normal urothelium    no stones    no ulcers     no tumors    no urethral polyps    no trabeculation  - Ureteral orifices were normal in position and appearance.  Post-Procedure: - Patient tolerated the procedure well  Assessment/ Plan: -No mucosal abnormalities noted on cystoscopy. -She has a follow-up visit scheduled with Larene Beach 05/17/2020   Abbie Sons, MD

## 2019-11-18 ENCOUNTER — Telehealth: Payer: Self-pay

## 2019-11-18 NOTE — Telephone Encounter (Signed)
Patient called stating she is having urgency, frequency and urge incontinence since her cysto last week. Patient was added on to PA for further evaluation for possible UTI

## 2019-11-19 ENCOUNTER — Encounter: Payer: Self-pay | Admitting: Physician Assistant

## 2019-11-19 ENCOUNTER — Other Ambulatory Visit: Payer: Self-pay

## 2019-11-19 ENCOUNTER — Ambulatory Visit: Payer: Medicare Other | Admitting: Physician Assistant

## 2019-11-19 VITALS — BP 127/71 | HR 76 | Ht 64.0 in | Wt 119.8 lb

## 2019-11-19 DIAGNOSIS — R3915 Urgency of urination: Secondary | ICD-10-CM

## 2019-11-19 LAB — MICROSCOPIC EXAMINATION: WBC, UA: >30 /hpf — AB (ref 0–5)

## 2019-11-19 LAB — URINALYSIS, COMPLETE
Bilirubin, UA: NEGATIVE
Glucose, UA: NEGATIVE
Ketones, UA: NEGATIVE
Nitrite, UA: POSITIVE — AB
Specific Gravity, UA: 1.02 (ref 1.005–1.030)
Urobilinogen, Ur: 0.2 mg/dL (ref 0.2–1.0)
pH, UA: 6.5 (ref 5.0–7.5)

## 2019-11-19 MED ORDER — CEPHALEXIN 500 MG PO CAPS: 500.0000 mg | ORAL_CAPSULE | Freq: Two times a day (BID) | ORAL | 0 refills | Status: AC

## 2019-11-19 NOTE — Patient Instructions (Signed)

## 2019-11-19 NOTE — Progress Notes (Signed)
11/19/2019 9:08 AM   Brandi Ramos Mar 20, 1946 [id number]  CC: Urgency, frequency, urge incontinence  HPI: Brandi Ramos is a 74 y.o. female who presents today for evaluation of possible UTI. She is an established [facility] patient who underwent cystoscopy with Dr. Bernardo Heater on 11/13/2019 for evaluation of microscopic hematuria.    Today, she reports urinary urgency, frequency, and urge incontinence following her cystoscopy.  She was originally experiencing dysuria as well, however this has subsided.  She denies fever, chills, nausea, vomiting, and flank pain.  In-office UA today positive for trace-intact blood, 1+ protein, nitrites, 1+ leukocyte esterase; urine microscopy with >30 WBCs/HPF, 3-10 RBCs/HPF, and many bacteria.   She is allergic to sulfa antibiotics.  PMH: Past Medical History:  Diagnosis Date  . Anxiety   . Arthritis   . Chicken pox   . Depression   . Diverticulosis   . Fibrocystic breast disease   . Frozen shoulder   . Fundic gland polyps of stomach, benign   . GERD (gastroesophageal reflux disease)   . History of diverticulitis of colon   . Migraine headache   . Neutropenia ([place])    worked up - Dr Oliva Bustard, slightly elevated ANA  . PUD (peptic ulcer disease)   . Recurrent vaginitis   . Seasonal allergies   . 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     Surgical History: Past Surgical History:  Procedure Laterality Date  . ABDOMINAL HYSTERECTOMY  1984  . COLONOSCOPY    . COLONOSCOPY WITH PROPOFOL N/A 09/26/2018   Procedure: COLONOSCOPY WITH PROPOFOL;  Surgeon: Lollie Sails, MD;  Location: [facility] ENDOSCOPY;  Service: Endoscopy;  Laterality: N/A;  . ESOPHAGOGASTRODUODENOSCOPY (EGD) WITH PROPOFOL N/A 10/31/2015   Procedure: ESOPHAGOGASTRODUODENOSCOPY (EGD) WITH PROPOFOL;  Surgeon: Lollie Sails, MD;  Location: [facility] ENDOSCOPY;  Service: Endoscopy;  Laterality: N/A;  . ESOPHAGOGASTRODUODENOSCOPY (EGD) WITH PROPOFOL N/A 09/26/2018   Procedure: ESOPHAGOGASTRODUODENOSCOPY (EGD) WITH PROPOFOL;  Surgeon: Lollie Sails, MD;  Location: [facility] ENDOSCOPY;  Service: Endoscopy;  Laterality: N/A;  . LAPAROSCOPIC APPENDECTOMY N/A 10/29/2018   Procedure: APPENDECTOMY LAPAROSCOPIC;  Surgeon: Herbert Pun, MD;  Location: [facility] ORS;  Service: General;  Laterality: N/A;  . LAPAROSCOPIC BILATERAL SALPINGO OOPHERECTOMY Bilateral 10/29/2018   Procedure: LAPAROSCOPIC BILATERAL SALPINGO OOPHORECTOMY;  Surgeon: Ward, Honor Loh, MD;  Location: [facility] ORS;  Service: Gynecology;  Laterality: Bilateral;    Home Medications:  Allergies as of 11/19/2019      Reactions   Amitiza [lubiprostone] Other (See Comments)   Sulfa Antibiotics       Medication List       Accurate as of November 19, 2019  9:08 AM. If you have any questions, ask your nurse or doctor.        Aimovig (140 MG Dose) 70 MG/ML Soaj Generic drug: Erenumab-aooe INJ 1 SYRINGE UTD Q 4 WKS   ALPRAZolam 0.25 MG tablet Commonly known as: XANAX TAKE 1 TABLET BY MOUTH EVERY DAY AS NEEDED   busPIRone 10 MG tablet Commonly known as: BUSPAR Take 1 tablet (10 mg total) by mouth daily.   DULoxetine 60 MG capsule Commonly known as: CYMBALTA Take 1 capsule (60 mg total) by mouth daily.   fluticasone 50 MCG/ACT nasal spray Commonly known as: FLONASE SHAKE LIQUID AND USE 2 SPRAYS IN EACH NOSTRIL DAILY   Linzess 145 MCG Caps capsule Generic drug: linaclotide Take 145 mcg by mouth daily.   loratadine 10 MG tablet Commonly known as: CLARITIN  Take 10 mg by mouth daily.   promethazine 12.5 MG tablet Commonly known as: PHENERGAN   propranolol ER 60 MG 24 hr capsule Commonly known as: INDERAL LA TK 1 C PO D   RABEprazole 20 MG tablet Commonly known as: ACIPHEX Take 1 tablet (20 mg total) by mouth 2 (two) times daily before a meal.   rizatriptan 10 MG tablet Commonly known as: MAXALT Take 10 mg by mouth as needed. May repeat in 2 hours if needed   sucralfate 1 g  tablet Commonly known as: CARAFATE Take 1 tablet (1 g total) by mouth 2 (two) times daily as needed. What changed: when to take this   SUMAtriptan 100 MG tablet Commonly known as: IMITREX   topiramate 25 MG tablet Commonly known as: TOPAMAX TK 1 TO 2 TS PO HS   topiramate 50 MG tablet Commonly known as: TOPAMAX TAKE 1 AND 1/2 TABLET BY MOUTH IN MORNING AND 1 TABLET BY MOUTH IN EVENING   triamcinolone ointment 0.5 % Commonly known as: KENALOG Apply 1 application topically 2 (two) times daily.       Allergies:  Allergies  Allergen Reactions  . Amitiza [Lubiprostone] Other (See Comments)  . Sulfa Antibiotics     Family History: Family History  Problem Relation Age of Onset  . CAD Mother        s/p CABG  . Hypercholesterolemia Mother   . Breast cancer Mother 72  . Heart disease Mother   . Lung cancer Mother   . Hypertension Father   . Migraines Father   . Hypercholesterolemia Father   . Lung cancer Father   . Migraines Sister   . Arthritis Sister   . Lymphoma Other        grandmother  . Colon cancer Neg Hx     Social History:   reports that she has never smoked. She has never used smokeless tobacco. She reports current alcohol use. She reports that she does not use drugs.  Physical Exam: BP 127/71 (BP Location: Left Arm, Patient Position: Sitting, Cuff Size: Normal)   Pulse 76   Ht 5\' 4"  (1.626 m)   Wt 119 lb 12.8 oz (54.3 kg)   BMI 20.56 kg/m   Constitutional:  Alert and oriented, no acute distress, nontoxic appearing HEENT: [place], AT Cardiovascular: No clubbing, cyanosis, or edema Respiratory: Normal respiratory effort, no increased work of breathing GU: Mild suprapubic tenderness, no suprapubic fullness Skin: No rashes, bruises or suspicious lesions Neurologic: Grossly intact, no focal deficits, moving all 4 extremities Psychiatric: Normal mood and affect  Laboratory Data: Results for orders placed or performed in visit on 11/19/19  Microscopic  Examination   URINE  Result Value Ref Range   WBC, UA >30 (A) 0 - 5 /hpf   RBC 3-10 (A) 0 - 2 /hpf   Epithelial Cells (non renal) 0-10 0 - 10 /hpf   Renal Epithel, UA 0-10 (A) None seen /hpf   Bacteria, UA Many (A) None seen/Few  Urinalysis, Complete  Result Value Ref Range   Specific Gravity, UA 1.020 1.005 - 1.030   pH, UA 6.5 5.0 - 7.5   Color, UA Yellow Yellow   Appearance Ur Cloudy (A) Clear   Leukocytes,UA 1+ (A) Negative   Protein,UA 1+ (A) Negative/Trace   Glucose, UA Negative Negative   Ketones, UA Negative Negative   RBC, UA Trace (A) Negative   Bilirubin, UA Negative Negative   Urobilinogen, Ur 0.2 0.2 - 1.0 mg/dL   Nitrite,  UA Positive (A) Negative   Microscopic Examination See below:    Assessment & Plan:   1. Urinary urgency 74 year old female with a history of microscopic hematuria presents with dysuria, urgency, frequency, and incontinence following cystoscopy 1 week ago.  UA grossly infected.  We will start her on empiric Keflex and send her urine for culture to confirm.  Given her recent history of microscopic hematuria, will defer repeat UA following treatment as I do not suspect she will achieve resolution of this.  Counseled patient that I would contact her if her urine culture results indicated a necessary change in therapy.  I counseled her to proceed to the emergency room over the weekend if she develops acute worsening of her symptoms including fever, chills, nausea, vomiting, flank pain, or worsened abdominal pain.  She expressed understanding. - Urinalysis, Complete - CULTURE, URINE COMPREHENSIVE - cephALEXin (KEFLEX) 500 MG capsule; Take 1 capsule (500 mg total) by mouth 2 (two) times daily for 7 days.  Dispense: 14 capsule; Refill: 0   Return if symptoms worsen or fail to improve.  Debroah Loop, PA-C  [facility] [address], [place],  [address] [phone]

## 2019-11-23 ENCOUNTER — Other Ambulatory Visit: Payer: Self-pay | Admitting: Physician Assistant

## 2019-11-23 LAB — CULTURE, URINE COMPREHENSIVE

## 2019-11-28 ENCOUNTER — Encounter: Payer: Self-pay | Admitting: Emergency Medicine

## 2019-11-28 ENCOUNTER — Ambulatory Visit
Admission: EM | Admit: 2019-11-28 | Discharge: 2019-11-28 | Disposition: A | Discharge status: Home / Self Care | Payer: Medicare Other | Attending: Emergency Medicine | Admitting: Emergency Medicine

## 2019-11-28 ENCOUNTER — Other Ambulatory Visit: Payer: Self-pay

## 2019-11-28 DIAGNOSIS — N390 Urinary tract infection, site not specified: Secondary | ICD-10-CM | POA: Diagnosis not present

## 2019-11-28 LAB — URINALYSIS, COMPLETE (UACMP) WITH MICROSCOPIC
Bilirubin Urine: NEGATIVE
Glucose, UA: NEGATIVE mg/dL
Hgb urine dipstick: NEGATIVE
Ketones, ur: NEGATIVE mg/dL
Nitrite: NEGATIVE
Protein, ur: NEGATIVE mg/dL
RBC / HPF: NONE SEEN RBC/hpf (ref 0–5)
Specific Gravity, Urine: 1.015 (ref 1.005–1.030)
Squamous Epithelial / HPF: NONE SEEN (ref 0–5)
WBC, UA: >50 WBC/hpf (ref 0–5)
pH: 7 (ref 5.0–8.0)

## 2019-11-28 MED ORDER — AMOXICILLIN-POT CLAVULANATE 875-125 MG PO TABS: 1.0000 | ORAL_TABLET | Freq: Two times a day (BID) | ORAL | 0 refills | Status: DC

## 2019-11-28 MED ORDER — PHENAZOPYRIDINE HCL 200 MG PO TABS: 200.0000 mg | ORAL_TABLET | Freq: Three times a day (TID) | ORAL | 0 refills | Status: DC

## 2019-11-28 NOTE — ED Provider Notes (Signed)
[facility]    CSN: [id number] Arrival date & time: 11/28/19  1238      History   Chief Complaint Chief Complaint  Patient presents with  . Dysuria    HPI Brandi Ramos is a 74 y.o. female.   HPI  74 year old female presents today with a recurrent UTI complaining of dysuria described as burning frequency and urgency  and in satiety .  She denies any fever chills denies nausea vomiting and has had no vaginal discharge.  In review of her medical records she was seen by Luana Shu 11/19/2019 diagnosed with a UTI and placed on Keflex.  Patient states that she did improve on the Keflex but yesterday began to feel the onset of the symptoms again.  He had finished off her medications.  Cultures and sensitivities showed that she had greater than 100,000 colonies with a susceptibility of less than 4 to the Keflex.         Past Medical History:  Diagnosis Date  . Anxiety   . Arthritis   . Chicken pox   . Depression   . Diverticulosis   . Fibrocystic breast disease   . Frozen shoulder   . Fundic gland polyps of stomach, benign   . GERD (gastroesophageal reflux disease)   . History of diverticulitis of colon   . Migraine headache   . Neutropenia ([place])    worked up - Dr Oliva Bustard, slightly elevated ANA  . PUD (peptic ulcer disease)   . Recurrent vaginitis   . Seasonal allergies   . 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     Patient Active Problem List   Diagnosis Date Noted  . Constipation 04/05/2019  . Sinusitis 12/28/2018  . Adnexal mass 08/27/2018  . Hematuria 02/02/2018  . Nausea 09/05/2015  . Palpitations 04/27/2015  . Environmental allergies 12/26/2014  . Health care maintenance 12/26/2014  . Vaginal irritation 12/26/2014  . Chondromalacia of patella, right 09/13/2014  . Anxiety 04/25/2014  . Fatigue 01/31/2014  . Knee pain 12/29/2012  . 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 Gastritis, bile acid reflux 12/13/2012  . Migraine headache 09/21/2012  . Leukopenia 09/21/2012  . Neck pain 05/21/2012  . Adhesive capsulitis of shoulder 05/06/2012  . Primary localized osteoarthrosis of shoulder region 05/06/2012  . Mild depression ([place]) 11/19/2011  . GERD (gastroesophageal reflux disease) 11/19/2011  . H/O diverticulitis of colon 11/19/2011  . Recurrent vaginitis 11/19/2011    Past Surgical History:  Procedure Laterality Date  . ABDOMINAL HYSTERECTOMY  1984  . COLONOSCOPY    . COLONOSCOPY WITH PROPOFOL N/A 09/26/2018   Procedure: COLONOSCOPY WITH PROPOFOL;  Surgeon: Lollie Sails, MD;  Location: [facility] ENDOSCOPY;  Service: Endoscopy;  Laterality: N/A;  . ESOPHAGOGASTRODUODENOSCOPY (EGD) WITH PROPOFOL N/A 10/31/2015   Procedure: ESOPHAGOGASTRODUODENOSCOPY (EGD) WITH PROPOFOL;  Surgeon: Lollie Sails, MD;  Location: [facility] ENDOSCOPY;  Service: Endoscopy;  Laterality: N/A;  . ESOPHAGOGASTRODUODENOSCOPY (EGD) WITH PROPOFOL N/A 09/26/2018   Procedure: ESOPHAGOGASTRODUODENOSCOPY (EGD) WITH PROPOFOL;  Surgeon: Lollie Sails, MD;  Location[facility] ENDOSCOPY;  Service: Endoscopy;  Laterality: N/A;  . LAPAROSCOPIC APPENDECTOMY N/A 10/29/2018   Procedure: APPENDECTOMY LAPAROSCOPIC;  Surgeon: Herbert Pun, MD;  Location: [facility] ORS;  Service: General;  Laterality: N/A;  . LAPAROSCOPIC BILATERAL SALPINGO OOPHERECTOMY Bilateral 10/29/2018   Procedure: LAPAROSCOPIC BILATERAL SALPINGO OOPHORECTOMY;  Surgeon: Ward, Honor Loh, MD;  Location: [facility] ORS;  Service: Gynecology;  Laterality:  Bilateral;    OB History   No obstetric history on file.      Home Medications    Prior to Admission medications   Medication Sig Start Date End Date Taking? Authorizing Provider  AIMOVIG 140 DOSE 70 MG/ML SOAJ INJ 1 SYRINGE UTD Q 4 WKS 08/22/17   [provider]  ALPRAZolam (XANAX) 0.25 MG tablet TAKE 1 TABLET BY MOUTH EVERY DAY AS NEEDED 09/30/19   Einar Pheasant, MD  amoxicillin-clavulanate (AUGMENTIN) 875-125 MG tablet Take 1 tablet by mouth every 12 (twelve) hours. 11/28/19   Lorin Picket, PA-C  busPIRone (BUSPAR) 10 MG tablet Take 1 tablet (10 mg total) by mouth daily. 08/17/19   Einar Pheasant, MD  DULoxetine (CYMBALTA) 60 MG capsule Take 1 capsule (60 mg total) by mouth daily. 10/30/19   Einar Pheasant, MD  fluticasone (FLONASE) 50 MCG/ACT nasal spray SHAKE LIQUID AND USE 2 SPRAYS IN EACH NOSTRIL DAILY 03/12/19   Einar Pheasant, MD  LINZESS 145 MCG CAPS capsule Take 145 mcg by mouth daily. 03/09/15   [provider]  loratadine (CLARITIN) 10 MG tablet Take 10 mg by mouth daily.    [provider]  phenazopyridine (PYRIDIUM) 200 MG tablet Take 1 tablet (200 mg total) by mouth 3 (three) times daily. 11/28/19   Lorin Picket, PA-C  promethazine (PHENERGAN) 12.5 MG tablet  09/28/19   [provider]  propranolol ER (INDERAL LA) 60 MG 24 hr capsule TK 1 C PO D 02/27/19   [provider]  RABEprazole (ACIPHEX) 20 MG tablet Take 1 tablet (20 mg total) by mouth 2 (two) times daily before a meal. 08/17/19   Einar Pheasant, MD  rizatriptan (MAXALT) 10 MG tablet Take 10 mg by mouth as needed. May repeat in 2 hours if needed    [provider]  sucralfate (CARAFATE) 1 g tablet Take 1 tablet (1 g total) by mouth 2 (two) times daily as needed. Patient taking differently: Take 1 g by mouth 2 (two) times daily.  07/04/18   Einar Pheasant, MD  SUMAtriptan (IMITREX) 100 MG tablet  02/02/19   [provider]  topiramate (TOPAMAX) 25 MG tablet TK 1 TO 2 TS PO HS 11/21/17   [provider]  topiramate (TOPAMAX) 50 MG tablet TAKE 1 AND 1/2 TABLET BY MOUTH IN MORNING AND 1 TABLET BY MOUTH IN EVENING 09/16/19   [provider]  triamcinolone ointment (KENALOG) 0.5 % Apply 1 application topically 2 (two) times daily. 03/08/18   Coral Spikes, DO    Family History Family History  Problem  Relation Age of Onset  . CAD Mother        s/p CABG  . Hypercholesterolemia Mother   . Breast cancer Mother 60  . Heart disease Mother   . Lung cancer Mother   . Hypertension Father   . Migraines Father   . Hypercholesterolemia Father   . Lung cancer Father   . Migraines Sister   . Arthritis Sister   . Lymphoma Other        grandmother  . Colon cancer Neg Hx     Social History Social History   Tobacco Use  . Smoking status: Never Smoker  . Smokeless tobacco: Never Used  Substance Use Topics  . Alcohol use: Yes    Alcohol/week: 0.0 standard drinks    Comment: wine occassionally  . Drug use: No     Allergies   Amitiza [lubiprostone] and Sulfa antibiotics   Review  of Systems Review of Systems  Constitutional: Positive for activity change. Negative for appetite change, chills, diaphoresis, fatigue and fever.  Genitourinary: Positive for dysuria, frequency and urgency. Negative for vaginal discharge and vaginal pain.  All other systems reviewed and are negative.    Physical Exam Triage Vital Signs ED Triage Vitals  Enc Vitals Group     BP 11/28/19 1313 (!) 134/118     Pulse Rate 11/28/19 1313 64     Resp 11/28/19 1313 14     Temp 11/28/19 1313 97.7 F (36.5 C)     Temp Source 11/28/19 1313 Oral     SpO2 11/28/19 1313 100 %     Weight 11/28/19 1310 119 lb (54 kg)     Height 11/28/19 1310 5\' 6"  (1.676 m)     Head Circumference --      Peak Flow --      Pain Score 11/28/19 1310 7     Pain Loc --      Pain Edu? --      Excl. in [place]? --    No data found.  Updated Vital Signs BP (!) 134/118 (BP Location: Right Arm)   Pulse 64   Temp 97.7 F (36.5 C) (Oral)   Resp 14   Ht 5\' 6"  (1.676 m)   Wt 119 lb (54 kg)   SpO2 100%   BMI 19.21 kg/m   Visual Acuity Right Eye Distance:   Left Eye Distance:   Bilateral Distance:    Right Eye Near:   Left Eye Near:    Bilateral Near:     Physical Exam Vitals and nursing note reviewed.  Constitutional:       General: She is not in acute distress.    Appearance: Normal appearance. She is normal weight. She is not ill-appearing, toxic-appearing or diaphoretic.  HENT:     Head: Normocephalic and atraumatic.  Eyes:     Conjunctiva/sclera: Conjunctivae normal.  Cardiovascular:     Rate and Rhythm: Normal rate and regular rhythm.     Heart sounds: Normal heart sounds.  Pulmonary:     Effort: Pulmonary effort is normal.     Breath sounds: Normal breath sounds.  Abdominal:     Palpations: Abdomen is soft.     Tenderness: There is no right CVA tenderness or left CVA tenderness.  Musculoskeletal:        General: Normal range of motion.     Cervical back: Normal range of motion and neck supple.  Skin:    General: Skin is warm and dry.  Neurological:     General: No focal deficit present.     Mental Status: She is alert and oriented to person, place, and time.  Psychiatric:        Mood and Affect: Mood normal.        Behavior: Behavior normal.        Thought Content: Thought content normal.        Judgment: Judgment normal.      UC Treatments / Results  Labs (all labs ordered are listed, but only abnormal results are displayed) Labs Reviewed  URINALYSIS, COMPLETE (UACMP) WITH MICROSCOPIC - Abnormal; Notable for the following components:      Result Value   APPearance HAZY (*)    Leukocytes,Ua SMALL (*)    Bacteria, UA FEW (*)    All other components within normal limits  URINE CULTURE    EKG   Radiology No results found.  Procedures Procedures (  including critical care time)  Medications Ordered in UC Medications - No data to display  Initial Impression / Assessment and Plan / UC Course  I have reviewed the triage vital signs and the nursing notes.  Pertinent labs & imaging results that were available during my care of the patient were reviewed by me and considered in my medical decision making (see chart for details).   74 year old female presents with dysuria described  as burning urinary urgency urgency and frequency started this morning.  Recently been treated for UTI on 11/19/2019 with Keflex which cultures and sensitivities showed it to be Keflex.  She does admit that she was improving until she had finished her prescription and then quickly have the symptoms return.  Had no nausea or vomiting fever or chills or vaginal discharge.  I reviewed her medical record notes in detail particularly with her last visit with her urologist and the laboratory obtained at that time.  Another urinalysis obtained today showing that she had greater than 50 WBCs small amount of leukocytes.  Because she is having symptoms again similar to what she had before I will start her on another course of antibiotics at this time Augmentin which was also sensitive at her last culture and sensitivity.  She is allergic to sulfa drugs.  Linna Hoff was resistant with her last cultures.  Reculture her today.  I will provide her with Pyridium since she is miserable with her symptoms.  Encouraged her to contact the urologist this week to notify them of her failure on the Keflex.  She should continue to follow-up with them.  If she develops any nausea vomiting fever chills she should return to our clinic or contact her urologist or go to the emergency room.   Final Clinical Impressions(s) / UC Diagnoses   Final diagnoses:  Lower urinary tract infectious disease     Discharge Instructions     Drink plenty of fluids.  Notify your urologist of the second round of medications that we placed you on for continuing UTI.   ED Prescriptions    Medication Sig Dispense Auth. Provider   amoxicillin-clavulanate (AUGMENTIN) 875-125 MG tablet Take 1 tablet by mouth every 12 (twelve) hours. 14 tablet Crecencio Mc P, PA-C   phenazopyridine (PYRIDIUM) 200 MG tablet Take 1 tablet (200 mg total) by mouth 3 (three) times daily. 6 tablet Lorin Picket, PA-C     PDMP not reviewed this encounter.   Lorin Picket, PA-C 11/28/19 1534

## 2019-11-28 NOTE — ED Triage Notes (Signed)
Patient states that she was treated for a UTI and finished her antibiotic yesterday.  Patient c/o burning when urinating and urinary urgency that started this morning.

## 2019-11-28 NOTE — Discharge Instructions (Addendum)
Drink plenty of fluids.  Notify your urologist of the second round of medications that we placed you on for continuing UTI.

## 2019-11-30 ENCOUNTER — Other Ambulatory Visit: Payer: Self-pay | Admitting: Internal Medicine

## 2019-11-30 LAB — URINE CULTURE: Culture: >=100000 — AB

## 2019-11-30 NOTE — Telephone Encounter (Signed)
Last OV 10/13/2019 Next OV 01/12/20  Last refill 09/30/19

## 2019-11-30 NOTE — Telephone Encounter (Signed)
Pt called in an needs alprazolam prescription refilled. She only has 3 left.

## 2019-12-01 ENCOUNTER — Telehealth (HOSPITAL_COMMUNITY): Payer: Self-pay | Admitting: *Deleted

## 2019-12-01 MED ORDER — ALPRAZOLAM 0.25 MG PO TABS: ORAL_TABLET | ORAL | 1 refills | Status: DC

## 2019-12-01 MED ORDER — CEFDINIR 300 MG PO CAPS: 300.0000 mg | ORAL_CAPSULE | Freq: Two times a day (BID) | ORAL | 0 refills | Status: DC

## 2019-12-01 NOTE — Telephone Encounter (Signed)
Omnicef 300 mg BID x 7 days per Dr Thersa Salt to be sent to [facility] in [place]. Patient states that she is feeling slightly better. Patient to continue to push fluids and will switch antibiotics this am.

## 2019-12-18 ENCOUNTER — Encounter: Payer: Self-pay | Admitting: Emergency Medicine

## 2019-12-18 ENCOUNTER — Ambulatory Visit
Admission: EM | Admit: 2019-12-18 | Discharge: 2019-12-18 | Disposition: A | Discharge status: Home / Self Care | Payer: Medicare Other | Attending: Family Medicine | Admitting: Family Medicine

## 2019-12-18 ENCOUNTER — Other Ambulatory Visit: Payer: Self-pay

## 2019-12-18 DIAGNOSIS — R350 Frequency of micturition: Secondary | ICD-10-CM | POA: Insufficient documentation

## 2019-12-18 DIAGNOSIS — Z0189 Encounter for other specified special examinations: Secondary | ICD-10-CM | POA: Diagnosis present

## 2019-12-18 HISTORY — DX: Calculus of kidney: N20.0

## 2019-12-18 LAB — URINALYSIS, COMPLETE (UACMP) WITH MICROSCOPIC
Bacteria, UA: NONE SEEN
Bilirubin Urine: NEGATIVE
Glucose, UA: NEGATIVE mg/dL
Hgb urine dipstick: NEGATIVE
Ketones, ur: NEGATIVE mg/dL
Leukocytes,Ua: NEGATIVE
Nitrite: NEGATIVE
Protein, ur: NEGATIVE mg/dL
RBC / HPF: NONE SEEN RBC/hpf (ref 0–5)
Specific Gravity, Urine: 1.025 (ref 1.005–1.030)
WBC, UA: NONE SEEN WBC/hpf (ref 0–5)
pH: 6.5 (ref 5.0–8.0)

## 2019-12-18 NOTE — ED Triage Notes (Signed)
Patient in today c/o urinary urgency x 2 days. Patient was treated for UTI on 11/28/19 and finished the medications. Patient states that she felt better for about 1 week, but has been having symptoms again for 2 days.

## 2019-12-18 NOTE — ED Triage Notes (Signed)
Patient has appointment with her urologist on Monday (12/21/19).

## 2019-12-20 ENCOUNTER — Encounter: Payer: Self-pay | Admitting: Urgent Care

## 2019-12-20 NOTE — ED Provider Notes (Signed)
[name]   Name: Brandi Ramos DOB: 12/14/1945 MRN: [id number] CSN: [id number] PCP: Einar Pheasant, MD  Arrival date and time:  12/18/19 1158  Chief Complaint:  Urinary Urgency  NOTE: Prior to seeing the patient today, I have reviewed the triage nursing documentation and vital signs. Clinical staff has updated patient's PMH/PSHx, current medication list, and drug allergies/intolerances to ensure comprehensive history available to assist in medical decision making.   History:   HPI: Brandi Ramos is a 74 y.o. female who presents today with complaints of urinary symptoms that began approximately 2 days ago. She complains mainly or urinary urgency; denies frequency and dysuria. Patient has been having issues with recurrent infections for about the last month. She notes that she has recently had a urinary calculus that she passed. EMR reviewed as follows:   Seen in office on 11/13/2019 by Bernardo Heater, MD. She underwent a cystoscopy at this visit. Results revealed no concerning findings.    Seen on 11/19/2019 by Clovis Pu (urology) for LUTS following cystoscopy. During that visit, she was diagnosed with a urinary tract infection.  UA revealed >30 WBC/hpf, 3-10 RBC/hpf, many bacteria, (+) nitrite, and 1+ LE. Patient was empirically covered with a 7 day course of cephalexin. Reflex C&S revealed >100,000 CFU/mL Esherichia coli that was SUSCEPTIBLE to the prescribed intervention.    Seen on 11/28/2019 by Marcelyn Ditty (urgent care) at which time she reported her recent infection and treatment with antibiotics. Patient advised that she felt better for about a week, with return of symptoms about 1 day prior to her visit. Repeat UA revealed > 50 WBC/hpf, 0 RBC/hpf, small amount of LE, and few bacteria; no nitrites. Patient was covered empirically with a 7 day course of amoxicillin-clavulanate and given phenazopyridine for pain. Reflex C&S >100,000 CFU/mL Esherichia coli that was RESISTANT  to the prescribed intervention. Patient was contacted and her antibiotic was changed to a 7 day course of cefdinir.   Patient presents today with a perceived recurrence of symptoms. She is anxious and overall concerned about the frequency with which she is experiencing infections as of last. She has not appreciated any gross hematuria, nor has she noticed her urine being malodorous. Patient denies any associated nausea, vomiting, fever, or chills. She has not experienced any pain in her lower back, flank area, or abdomen. She denies any vaginal pain, bleeding, or discharge. She is not experiencing any urinary incontinence. Patient denies using excessive amounts of caffeine, however endorses that she has been drinking more fluid in efforts to flush her urinary tract. Despite her symptoms, patient has not taken any over the counter interventions to help improve/relieve her reported symptoms at home.   Past Medical History:  Diagnosis Date  . Anxiety   . Arthritis   . Chicken pox   . Depression   . Diverticulosis   . Fibrocystic breast disease   . Frozen shoulder   . Fundic gland polyps of stomach, benign   . GERD (gastroesophageal reflux disease)   . History of diverticulitis of colon   . Kidney stones   . Migraine headache   . Neutropenia ([place])    worked up - Dr Oliva Bustard, slightly elevated ANA  . PUD (peptic ulcer disease)   . Recurrent vaginitis   . Seasonal allergies   . 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     Past Surgical History:  Procedure Laterality Date  . ABDOMINAL HYSTERECTOMY  1984  .  COLONOSCOPY    . COLONOSCOPY WITH PROPOFOL N/A 09/26/2018   Procedure: COLONOSCOPY WITH PROPOFOL;  Surgeon: Lollie Sails, MD;  Location: [facility] ENDOSCOPY;  Service: Endoscopy;  Laterality: N/A;  . ESOPHAGOGASTRODUODENOSCOPY (EGD) WITH PROPOFOL N/A 10/31/2015   Procedure: ESOPHAGOGASTRODUODENOSCOPY (EGD) WITH PROPOFOL;  Surgeon: Lollie Sails, MD;  Location:  [facility] ENDOSCOPY;  Service: Endoscopy;  Laterality: N/A;  . ESOPHAGOGASTRODUODENOSCOPY (EGD) WITH PROPOFOL N/A 09/26/2018   Procedure: ESOPHAGOGASTRODUODENOSCOPY (EGD) WITH PROPOFOL;  Surgeon: Lollie Sails, MD;  Location: [facility] ENDOSCOPY;  Service: Endoscopy;  Laterality: N/A;  . LAPAROSCOPIC APPENDECTOMY N/A 10/29/2018   Procedure: APPENDECTOMY LAPAROSCOPIC;  Surgeon: Herbert Pun, MD;  Location: [facility] ORS;  Service: General;  Laterality: N/A;  . LAPAROSCOPIC BILATERAL SALPINGO OOPHERECTOMY Bilateral 10/29/2018   Procedure: LAPAROSCOPIC BILATERAL SALPINGO OOPHORECTOMY;  Surgeon: Ward, Honor Loh, MD;  Location: [facility] ORS;  Service: Gynecology;  Laterality: Bilateral;    Family History  Problem Relation Age of Onset  . CAD Mother        s/p CABG  . Hypercholesterolemia Mother   . Breast cancer Mother 58  . Heart disease Mother   . Lung cancer Mother   . Hypertension Father   . Migraines Father   . Hypercholesterolemia Father   . Lung cancer Father   . Migraines Sister   . Arthritis Sister   . Lymphoma Other        grandmother  . Colon cancer Neg Hx     Social History   Tobacco Use  . Smoking status: Never Smoker  . Smokeless tobacco: Never Used  Substance Use Topics  . Alcohol use: Yes    Alcohol/week: 0.0 standard drinks    Comment: wine occassionally  . Drug use: No    Patient Active Problem List   Diagnosis Date Noted  . Constipation 04/05/2019  . Sinusitis 12/28/2018  . Adnexal mass 08/27/2018  . Hematuria 02/02/2018  . Nausea 09/05/2015  . Palpitations 04/27/2015  . Environmental allergies 12/26/2014  . Health care maintenance 12/26/2014  . Vaginal irritation 12/26/2014  . Chondromalacia of patella, right 09/13/2014  . Anxiety 04/25/2014  . Fatigue 01/31/2014  . Knee pain 12/29/2012  . 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 Gastritis, bile acid reflux 12/13/2012  . Migraine headache 09/21/2012  . Leukopenia  09/21/2012  . Neck pain 05/21/2012  . Adhesive capsulitis of shoulder 05/06/2012  . Primary localized osteoarthrosis of shoulder region 05/06/2012  . Mild depression ([place]) 11/19/2011  . GERD (gastroesophageal reflux disease) 11/19/2011  . H/O diverticulitis of colon 11/19/2011  . Recurrent vaginitis 11/19/2011    Home Medications:    Current Meds  Medication Sig  . AIMOVIG 140 DOSE 70 MG/ML SOAJ INJ 1 SYRINGE UTD Q 4 WKS  . ALPRAZolam (XANAX) 0.25 MG tablet TAKE 1 TABLET BY MOUTH EVERY DAY AS NEEDED  . DULoxetine (CYMBALTA) 60 MG capsule Take 1 capsule (60 mg total) by mouth daily.  . fluticasone (FLONASE) 50 MCG/ACT nasal spray SHAKE LIQUID AND USE 2 SPRAYS IN EACH NOSTRIL DAILY  . loratadine (CLARITIN) 10 MG tablet Take 10 mg by mouth daily.  . phenazopyridine (PYRIDIUM) 200 MG tablet Take 1 tablet (200 mg total) by mouth 3 (three) times daily.  . promethazine (PHENERGAN) 12.5 MG tablet   . propranolol ER (INDERAL LA) 60 MG 24 hr capsule TK 1 C PO D  . RABEprazole (ACIPHEX) 20 MG tablet Take 1 tablet (20 mg total) by mouth 2 (two) times daily before  a meal.  . rizatriptan (MAXALT) 10 MG tablet Take 10 mg by mouth as needed. May repeat in 2 hours if needed  . SUMAtriptan (IMITREX) 100 MG tablet   . topiramate (TOPAMAX) 50 MG tablet TAKE 1 AND 1/2 TABLET BY MOUTH IN MORNING AND 1 TABLET BY MOUTH IN EVENING    Allergies:   Amitiza [lubiprostone] and Sulfa antibiotics  Review of Systems (ROS):  Review of systems NEGATIVE unless otherwise noted in narrative H&P section.   Vital Signs: Today's Vitals   12/18/19 1221 12/18/19 1223 12/18/19 1306  BP: 107/64    Pulse: 72    Resp: 18    Temp: 97.8 F (36.6 C)    TempSrc: Oral    SpO2: 100%    Weight:  119 lb (54 kg)   Height:  5\' 5"  (1.651 m)   PainSc:  0-No pain 0-No pain    Physical Exam: Physical Exam  Constitutional: She is oriented to person, place, and time and well-developed, well-nourished, and in no distress.   HENT:  Head: Normocephalic and atraumatic.  Eyes: Pupils are equal, round, and reactive to light.  Cardiovascular: Normal rate and intact distal pulses.  Pulmonary/Chest: Effort normal. No respiratory distress.  Abdominal: Soft. Normal appearance and bowel sounds are normal. She exhibits no distension. There is no abdominal tenderness. There is no CVA tenderness.  Genitourinary:    Genitourinary Comments: Exam deferred. No vaginal bleeding or discharge. No issues initiating urinary stream. No incontinence or gross hematuria.    Neurological: She is alert and oriented to person, place, and time. Gait normal.  Skin: Skin is warm and dry. No rash noted. She is not diaphoretic.  Psychiatric: Memory, affect and judgment normal. Her mood appears anxious.  Nursing note and vitals reviewed.   Urgent Care Treatments / Results:   Orders Placed This Encounter  Procedures  . Urinalysis, Complete w Microscopic    LABS: PLEASE NOTE: all labs that were ordered this encounter are listed, however only abnormal results are displayed. Labs Reviewed  URINALYSIS, COMPLETE (UACMP) WITH MICROSCOPIC    EKG: -None  RADIOLOGY: No results found.  PROCEDURES: Procedures  MEDICATIONS RECEIVED THIS VISIT: Medications - No data to display  PERTINENT CLINICAL COURSE NOTES/UPDATES:   Initial Impression / Assessment and Plan / Urgent Care Course:  Pertinent labs & imaging results that were available during my care of the patient were personally reviewed by me and considered in my medical decision making (see lab/imaging section of note for values and interpretations).  KAILEE GARNO is a 74 y.o. female who presents to [facility] today with complaints of Urinary Urgency  Patient is well appearing overall in clinic today. She does not appear to be in any acute distress. Presenting symptoms (see HPI) and exam as documented above. Urinary urgency x 2 days in the setting of patient with multiple  recent infections. Recent cystoscopy unremarkable. Repeat UA today negative for infection; 0 WBC/hpf, 0 RBC/hpf, and no nitrites, LE, or bacteria. Patient seems concerned as she continues to have the urgency. She is scheduled to see urology on Monday of next week.   While the patient was in clinic, I took the liberty of contact Vaillancourt, PA-C (urology) via [facility] to discuss patient and her symptoms. Specifically discussed possibility of age related OAB. Urology advising that symptoms could reflect residual irritation post-cystoscopy and recent infections. These symptoms should be self limiting and improve with time per PA. Discussed potential trial of oxybutynin over the weekend, with  follow up in their clinic on Monday, however given that symptoms are felt to be mild in nature, PA and I decided to defer until next week.   I have reviewed the follow up and strict return precautions for any new or worsening symptoms. Patient is aware of symptoms that would be deemed urgent/emergent, and would thus require further evaluation either here or in the emergency department. At the time of discharge, she verbalized understanding and consent with the discharge plan as it was reviewed with her. All questions were fielded by provider and/or clinic staff prior to patient discharge.    Final Clinical Impressions / Urgent Care Diagnoses:   Final diagnoses:  Urinary frequency  Encounter for urine test   New Prescriptions:   Controlled Substance Registry consulted? Not Applicable  No orders of the defined types were placed in this encounter.   Recommended Follow up Care:  Patient encouraged to follow up with the following provider within the specified time frame, or sooner as dictated by the severity of her symptoms. As always, she was instructed that for any urgent/emergent care needs, she should seek care either here or in the emergency department for more immediate evaluation.  Follow-up Information     Abbie Sons, MD On 12/21/2019.   Specialty: Urology Why: As already scheduled Contact information: [address] [place] [phone]         NOTE: This note was prepared using Dragon dictation software along with smaller phrase technology. Despite my best ability to proofread, there is the potential that transcriptional errors may still occur from this process, and are completely unintentional.    Karen Kitchens, NP 12/20/19 1155

## 2019-12-21 ENCOUNTER — Ambulatory Visit: Payer: Medicare Other | Admitting: Physician Assistant

## 2019-12-21 ENCOUNTER — Encounter: Payer: Self-pay | Admitting: Physician Assistant

## 2019-12-21 ENCOUNTER — Other Ambulatory Visit: Payer: Self-pay

## 2019-12-21 VITALS — BP 131/79 | HR 75 | Ht 65.0 in | Wt 119.0 lb

## 2019-12-21 DIAGNOSIS — R350 Frequency of micturition: Secondary | ICD-10-CM

## 2019-12-21 LAB — URINALYSIS, COMPLETE
Bilirubin, UA: NEGATIVE
Glucose, UA: NEGATIVE
Ketones, UA: NEGATIVE
Nitrite, UA: NEGATIVE
Protein,UA: NEGATIVE
RBC, UA: NEGATIVE
Specific Gravity, UA: 1.015 (ref 1.005–1.030)
Urobilinogen, Ur: 0.2 mg/dL (ref 0.2–1.0)
pH, UA: 7 (ref 5.0–7.5)

## 2019-12-21 LAB — MICROSCOPIC EXAMINATION
Bacteria, UA: NONE SEEN
RBC, Urine: NONE SEEN /hpf (ref 0–2)

## 2019-12-21 LAB — BLADDER SCAN AMB NON-IMAGING: Scan Result: 17 mL

## 2019-12-21 MED ORDER — OXYBUTYNIN CHLORIDE ER 10 MG PO TB24: 10.0000 mg | ORAL_TABLET | Freq: Every day | ORAL | 0 refills | Status: DC

## 2019-12-21 NOTE — Progress Notes (Signed)
12/21/2019 11:13 AM   Brandi Ramos [birth year] [id number]  CC: Urinary frequency  HPI: Brandi Ramos is a 74 y.o. female who presents today for evaluation of possible UTI.  I saw her in clinic on 11/19/2019 with complaints of urgency, frequency, and urge incontinence following cystoscopy.  UA grossly infected at that time, I treated her with Keflex 500 mg twice daily x7 days.  Urine culture ultimately resulted with ampicillin, nitrofurantoin, tetracycline, and Bactrim resistant E. Coli.  She followed up with urgent care on 11/28/2019 reporting symptom return following completion of prescribed Keflex.  UA notable for pyuria at that time.  She was prescribed Augmentin twice daily x7 days at that time.  Urine culture ultimately resulted with ampicillin and Bactrim resistant E. coli.  She was counseled to switch to Omnicef 300 mg twice daily x7 days.  She sought care again with urgent care on 12/18/2019 reporting symptom return following completion of Omnicef.  UA benign at that visit.  She was counseled to follow-up with clinic with [place] today.  Today, patient reports persistent urgency and frequency without leakage since her original symptom onset approximately 1 month ago.  She states her dysuria has resolved, and overall her symptoms have improved, however she is still bothered by the urgency and frequency.  Of note, patient denies constipation but states she is very prone to medication induced constipation.  In-office UA today positive for 2+ leukocyte esterase; urine microscopy with 6-10 WBCs/HPF. PVR 84mL.  PMH: Past Medical History:  Diagnosis Date  . Anxiety   . Arthritis   . Chicken pox   . Depression   . Diverticulosis   . Fibrocystic breast disease   . Frozen shoulder   . Fundic gland polyps of stomach, benign   . GERD (gastroesophageal reflux disease)   . History of diverticulitis of colon   . Kidney stones   . Migraine headache   . Neutropenia ([place])    worked up - Dr  Oliva Bustard, slightly elevated ANA  . PUD (peptic ulcer disease)   . Recurrent vaginitis   . Seasonal allergies   . 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     Surgical History: Past Surgical History:  Procedure Laterality Date  . ABDOMINAL HYSTERECTOMY  1984  . COLONOSCOPY    . COLONOSCOPY WITH PROPOFOL N/A 09/26/2018   Procedure: COLONOSCOPY WITH PROPOFOL;  Surgeon: Lollie Sails, MD;  Location: [facility] ENDOSCOPY;  Service: Endoscopy;  Laterality: N/A;  . ESOPHAGOGASTRODUODENOSCOPY (EGD) WITH PROPOFOL N/A 10/31/2015   Procedure: ESOPHAGOGASTRODUODENOSCOPY (EGD) WITH PROPOFOL;  Surgeon: Lollie Sails, MD;  Location: [facility] ENDOSCOPY;  Service: Endoscopy;  Laterality: N/A;  . ESOPHAGOGASTRODUODENOSCOPY (EGD) WITH PROPOFOL N/A 09/26/2018   Procedure: ESOPHAGOGASTRODUODENOSCOPY (EGD) WITH PROPOFOL;  Surgeon: Lollie Sails, MD;  Location: [facility] ENDOSCOPY;  Service: Endoscopy;  Laterality: N/A;  . LAPAROSCOPIC APPENDECTOMY N/A 10/29/2018   Procedure: APPENDECTOMY LAPAROSCOPIC;  Surgeon: Herbert Pun, MD;  Location: [facility] ORS;  Service: General;  Laterality: N/A;  . LAPAROSCOPIC BILATERAL SALPINGO OOPHERECTOMY Bilateral 10/29/2018   Procedure: LAPAROSCOPIC BILATERAL SALPINGO OOPHORECTOMY;  Surgeon: Ward, Honor Loh, MD;  Location: [facility] ORS;  Service: Gynecology;  Laterality: Bilateral;    Home Medications:  Allergies as of 12/21/2019      Reactions   Amitiza [lubiprostone] Other (See Comments)   Sulfa Antibiotics       Medication List       Accurate as of December 21, 2019 11:13 AM. If you  have any questions, ask your nurse or doctor.        Aimovig (140 MG Dose) 70 MG/ML Soaj Generic drug: Erenumab-aooe INJ 1 SYRINGE UTD Q 4 WKS   ALPRAZolam 0.25 MG tablet Commonly known as: XANAX TAKE 1 TABLET BY MOUTH EVERY DAY AS NEEDED   busPIRone 10 MG tablet Commonly known as: BUSPAR Take 1 tablet (10 mg total) by mouth daily.   DULoxetine 60 MG  capsule Commonly known as: CYMBALTA Take 1 capsule (60 mg total) by mouth daily.   fluticasone 50 MCG/ACT nasal spray Commonly known as: FLONASE SHAKE LIQUID AND USE 2 SPRAYS IN EACH NOSTRIL DAILY   loratadine 10 MG tablet Commonly known as: CLARITIN Take 10 mg by mouth daily.   phenazopyridine 200 MG tablet Commonly known as: PYRIDIUM Take 1 tablet (200 mg total) by mouth 3 (three) times daily.   promethazine 12.5 MG tablet Commonly known as: PHENERGAN   propranolol ER 60 MG 24 hr capsule Commonly known as: INDERAL LA TK 1 C PO D   RABEprazole 20 MG tablet Commonly known as: ACIPHEX Take 1 tablet (20 mg total) by mouth 2 (two) times daily before a meal.   rizatriptan 10 MG tablet Commonly known as: MAXALT Take 10 mg by mouth as needed. May repeat in 2 hours if needed   SUMAtriptan 100 MG tablet Commonly known as: IMITREX   topiramate 50 MG tablet Commonly known as: TOPAMAX TAKE 1 AND 1/2 TABLET BY MOUTH IN MORNING AND 1 TABLET BY MOUTH IN EVENING       Allergies:  Allergies  Allergen Reactions  . Amitiza [Lubiprostone] Other (See Comments)  . Sulfa Antibiotics     Family History: Family History  Problem Relation Age of Onset  . CAD Mother        s/p CABG  . Hypercholesterolemia Mother   . Breast cancer Mother 15  . Heart disease Mother   . Lung cancer Mother   . Hypertension Father   . Migraines Father   . Hypercholesterolemia Father   . Lung cancer Father   . Migraines Sister   . Arthritis Sister   . Lymphoma Other        grandmother  . Colon cancer Neg Hx     Social History:   reports that she has never smoked. She has never used smokeless tobacco. She reports current alcohol use. She reports that she does not use drugs.  Physical Exam: BP 131/79   Pulse 75   Ht 5\' 5"  (1.651 m)   Wt 119 lb (54 kg)   BMI 19.80 kg/m   Constitutional:  Alert and oriented, no acute distress, nontoxic appearing HEENT: [place], AT Cardiovascular: No clubbing,  cyanosis, or edema Respiratory: Normal respiratory effort, no increased work of breathing Skin: No rashes, bruises or suspicious lesions Neurologic: Grossly intact, no focal deficits, moving all 4 extremities Psychiatric: Normal mood and affect  Laboratory Data: Results for orders placed or performed in visit on 12/21/19  Microscopic Examination   URINE  Result Value Ref Range   WBC, UA 6-10 (A) 0 - 5 /hpf   RBC None seen 0 - 2 /hpf   Epithelial Cells (non renal) 0-10 0 - 10 /hpf   Bacteria, UA None seen None seen/Few  Urinalysis, Complete  Result Value Ref Range   Specific Gravity, UA 1.015 1.005 - 1.030   pH, UA 7.0 5.0 - 7.5   Color, UA Yellow Yellow   Appearance Ur Clear Clear  Leukocytes,UA 2+ (A) Negative   Protein,UA Negative Negative/Trace   Glucose, UA Negative Negative   Ketones, UA Negative Negative   RBC, UA Negative Negative   Bilirubin, UA Negative Negative   Urobilinogen, Ur 0.2 0.2 - 1.0 mg/dL   Nitrite, UA Negative Negative   Microscopic Examination See below:   BLADDER SCAN AMB NON-IMAGING  Result Value Ref Range   Scan Result 17 mL   Assessment & Plan:   1. Urinary frequency 74 year old female with a recent history of acute cystitis following cystoscopy with persistent infection requiring 2 courses of antibiotics presents with persistent urinary urgency and frequency.  UA with mild pyuria today, will send for culture for further evaluation.  PVR WNL.  I suspect her symptoms represent postinfectious inflammation of the bladder and are time-limited.  Counseled patient to stay well-hydrated.  Prescribing a short course of oxybutynin XL for management of urinary urgency and frequency.  Counseled patient to use MiraLAX daily while on this medication for management of constipation. - Urinalysis, Complete - CULTURE, URINE COMPREHENSIVE - oxybutynin (DITROPAN-XL) 10 MG 24 hr tablet; Take 1 tablet (10 mg total) by mouth daily.  Dispense: 14 tablet; Refill: 0 -  BLADDER SCAN AMB NON-IMAGING   Return if symptoms worsen or fail to improve.  Debroah Loop, PA-C  [facility] [address] [place],  [address] [phone]

## 2019-12-21 NOTE — Patient Instructions (Signed)
Stay well hydrated and start taking Miralax 1 capful daily while taking the oxybutynin to combat any constipation. I will call you if your urine culture comes back positive indicating another infection.  Call the clinic if you develop any difficulty tolerating the oxybutynin.

## 2019-12-25 ENCOUNTER — Other Ambulatory Visit: Payer: Self-pay | Admitting: Physician Assistant

## 2019-12-25 ENCOUNTER — Telehealth: Payer: Self-pay | Admitting: Physician Assistant

## 2019-12-25 LAB — CULTURE, URINE COMPREHENSIVE

## 2019-12-30 NOTE — Telephone Encounter (Signed)
Patient's most recent urine culture resulted with ampicillin, tetracycline, and Bactrim resistant E. coli.  I contacted the patient via telephone to check her urinary symptoms.  She reports her urinary symptoms have improved on 9 days of Ditropan XL.  She denies dysuria and states she only has some residual urinary urgency.  I suspect this patient may be experiencing OAB with superimposed asymptomatic bacteriuria.  In the absence of dysuria, will defer further antibiotic therapy. Counseled patient to contact our office in 2 days with symptom update; will prescribe antibiotics if she reports dysuria at that time. She expressed understanding.

## 2020-01-04 ENCOUNTER — Telehealth: Payer: Self-pay | Admitting: Physician Assistant

## 2020-01-04 NOTE — Telephone Encounter (Signed)
Pt is feeling better with urgency, if it gets worse this week she will call back. Pt states she was advised to call to inform of how pt is doing. - FYI

## 2020-01-12 ENCOUNTER — Encounter: Payer: Self-pay | Admitting: Internal Medicine

## 2020-01-12 ENCOUNTER — Ambulatory Visit (INDEPENDENT_AMBULATORY_CARE_PROVIDER_SITE_OTHER): Payer: Medicare Other | Admitting: Internal Medicine

## 2020-01-12 ENCOUNTER — Other Ambulatory Visit: Payer: Self-pay

## 2020-01-12 VITALS — BP 118/64 | HR 74 | Temp 98.0°F | Resp 16 | Ht 65.0 in | Wt 120.0 lb

## 2020-01-12 DIAGNOSIS — F419 Anxiety disorder, unspecified: Secondary | ICD-10-CM

## 2020-01-12 DIAGNOSIS — Z0000 Encounter for general adult medical examination without abnormal findings: Secondary | ICD-10-CM

## 2020-01-12 DIAGNOSIS — D72819 Decreased white blood cell count, unspecified: Secondary | ICD-10-CM

## 2020-01-12 DIAGNOSIS — K219 Gastro-esophageal reflux disease without esophagitis: Secondary | ICD-10-CM

## 2020-01-12 DIAGNOSIS — F320 Major depressive disorder, single episode, mild: Secondary | ICD-10-CM | POA: Diagnosis not present

## 2020-01-12 DIAGNOSIS — G43709 Chronic migraine without aura, not intractable, without status migrainosus: Secondary | ICD-10-CM | POA: Diagnosis not present

## 2020-01-12 DIAGNOSIS — F32A Depression, unspecified: Secondary | ICD-10-CM

## 2020-01-12 DIAGNOSIS — K5900 Constipation, unspecified: Secondary | ICD-10-CM

## 2020-01-12 MED ORDER — BUSPIRONE HCL 10 MG PO TABS: 10.0000 mg | ORAL_TABLET | Freq: Every day | ORAL | 2 refills | Status: DC

## 2020-01-12 NOTE — Progress Notes (Signed)
Patient ID: Brandi Ramos, female   DOB: [birth year], 74 y.o.   MRN: [id number]   Subjective:    Patient ID: Brandi Ramos, female    DOB: 05-03-1946, 74 y.o.   MRN: [id number]  HPI This visit occurred during the SARS-CoV-2 public health emergency.  Safety protocols were in place, including screening questions prior to the visit, additional usage of staff PPE, and extensive cleaning of exam room while observing appropriate contact time as indicated for disinfecting solutions.  Patient here for her physical exam.  She reports she is doing better.  Had issues with dysuria and urinary urgency recently.  Saw urology.  Recently treated for UTI.  Last saw urology 12/21/19 - placed on oxybutynin.  Appears to be doing better.  On no medication now.  Stays active.  No chest pain or sob reported.  No cough or congestion.  No abdominal pain reported.  Still issues with constipation.  Has tried various medication.  Has seen GI.  Persistent issue.  Discussed referral back to GI.  Taking buspar and cymbalta.  Feels she is doing some better with the increased stress.  Provided me with name of psych in her network.  She is getting out in her yard more.    Past Medical History:  Diagnosis Date  . Anxiety   . Arthritis   . Chicken pox   . Depression   . Diverticulosis   . Fibrocystic breast disease   . Frozen shoulder   . Fundic gland polyps of stomach, benign   . GERD (gastroesophageal reflux disease)   . History of diverticulitis of colon   . Kidney stones   . Migraine headache   . Neutropenia ([place])    worked up - Dr Oliva Bustard, slightly elevated ANA  . PUD (peptic ulcer disease)   . Recurrent vaginitis   . Seasonal allergies   . 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    Past Surgical History:  Procedure Laterality Date  . ABDOMINAL HYSTERECTOMY  1984  . COLONOSCOPY    . COLONOSCOPY WITH PROPOFOL N/A 09/26/2018   Procedure: COLONOSCOPY WITH PROPOFOL;  Surgeon: Lollie Sails, MD;  Location: [facility] ENDOSCOPY;  Service: Endoscopy;  Laterality: N/A;  . ESOPHAGOGASTRODUODENOSCOPY (EGD) WITH PROPOFOL N/A 10/31/2015   Procedure: ESOPHAGOGASTRODUODENOSCOPY (EGD) WITH PROPOFOL;  Surgeon: Lollie Sails, MD;  Location: [facility] ENDOSCOPY;  Service: Endoscopy;  Laterality: N/A;  . ESOPHAGOGASTRODUODENOSCOPY (EGD) WITH PROPOFOL N/A 09/26/2018   Procedure: ESOPHAGOGASTRODUODENOSCOPY (EGD) WITH PROPOFOL;  Surgeon: Lollie Sails, MD;  Location: [facility] ENDOSCOPY;  Service: Endoscopy;  Laterality: N/A;  . LAPAROSCOPIC APPENDECTOMY N/A 10/29/2018   Procedure: APPENDECTOMY LAPAROSCOPIC;  Surgeon: Herbert Pun, MD;  Location: [facility] ORS;  Service: General;  Laterality: N/A;  . LAPAROSCOPIC BILATERAL SALPINGO OOPHERECTOMY Bilateral 10/29/2018   Procedure: LAPAROSCOPIC BILATERAL SALPINGO OOPHORECTOMY;  Surgeon: Ward, Honor Loh, MD;  Location: [facility] ORS;  Service: Gynecology;  Laterality: Bilateral;   Family History  Problem Relation Age of Onset  . CAD Mother        s/p CABG  . Hypercholesterolemia Mother   . Breast cancer Mother 79  . Heart disease Mother   . Lung cancer Mother   . Hypertension Father   . Migraines Father   . Hypercholesterolemia Father   . Lung cancer Father   . Migraines Sister   . Arthritis Sister   . Lymphoma Other        grandmother  . Colon cancer Neg  Hx    Social History   Socioeconomic History  . Marital status: Widowed    Spouse name: Not on file  . Number of children: 2  . Years of education: Not on file  . Highest education level: Not on file  Occupational History  . Not on file  Tobacco Use  . Smoking status: Never Smoker  . Smokeless tobacco: Never Used  Substance and Sexual Activity  . Alcohol use: Yes    Alcohol/week: 0.0 standard drinks    Comment: wine occassionally  . Drug use: No  . Sexual activity: Not Currently  Other Topics Concern  . Not on file  Social History Narrative  . Not on file   Social  Determinants of Health   Financial Resource Strain: Low Risk   . Difficulty of Paying Living Expenses: Not hard at all  Food Insecurity: No Food Insecurity  . Worried About Charity fundraiser in the Last Year: Never true  . Ran Out of Food in the Last Year: Never true  Transportation Needs: No Transportation Needs  . Lack of Transportation (Medical): No  . Lack of Transportation (Non-Medical): No  Physical Activity: Sufficiently Active  . Days of Exercise per Week: 4 days  . Minutes of Exercise per Session: 40 min  Stress: No Stress Concern Present  . Feeling of Stress : Not at all  Social Connections:   . Frequency of Communication with Friends and Family:   . Frequency of Social Gatherings with Friends and Family:   . Attends Religious Services:   . Active Member of Clubs or Organizations:   . Attends Archivist Meetings:   Marland Kitchen Marital Status:     Outpatient Encounter Medications as of 01/12/2020  Medication Sig  . AIMOVIG 140 DOSE 70 MG/ML SOAJ INJ 1 SYRINGE UTD Q 4 WKS  . ALPRAZolam (XANAX) 0.25 MG tablet TAKE 1 TABLET BY MOUTH EVERY DAY AS NEEDED  . busPIRone (BUSPAR) 10 MG tablet Take 1 tablet (10 mg total) by mouth daily.  . DULoxetine (CYMBALTA) 60 MG capsule Take 1 capsule (60 mg total) by mouth daily.  . fluticasone (FLONASE) 50 MCG/ACT nasal spray SHAKE LIQUID AND USE 2 SPRAYS IN EACH NOSTRIL DAILY  . loratadine (CLARITIN) 10 MG tablet Take 10 mg by mouth daily.  Marland Kitchen oxybutynin (DITROPAN-XL) 10 MG 24 hr tablet Take 1 tablet (10 mg total) by mouth daily.  . promethazine (PHENERGAN) 12.5 MG tablet   . propranolol ER (INDERAL LA) 60 MG 24 hr capsule TK 1 C PO D  . RABEprazole (ACIPHEX) 20 MG tablet Take 1 tablet (20 mg total) by mouth 2 (two) times daily before a meal.  . rizatriptan (MAXALT) 10 MG tablet Take 10 mg by mouth as needed. May repeat in 2 hours if needed  . SUMAtriptan (IMITREX) 100 MG tablet   . topiramate (TOPAMAX) 50 MG tablet TAKE 1 AND 1/2 TABLET  BY MOUTH IN MORNING AND 1 TABLET BY MOUTH IN EVENING  . [DISCONTINUED] busPIRone (BUSPAR) 10 MG tablet Take 1 tablet (10 mg total) by mouth daily.  . [DISCONTINUED] LINZESS 145 MCG CAPS capsule Take 145 mcg by mouth daily.  . [DISCONTINUED] phenazopyridine (PYRIDIUM) 200 MG tablet Take 1 tablet (200 mg total) by mouth 3 (three) times daily.  . [DISCONTINUED] sucralfate (CARAFATE) 1 g tablet Take 1 tablet (1 g total) by mouth 2 (two) times daily as needed. (Patient taking differently: Take 1 g by mouth 2 (two) times daily. )  No facility-administered encounter medications on file as of 01/12/2020.    Review of Systems  Constitutional: Negative for appetite change and unexpected weight change.  HENT: Negative for congestion and sinus pressure.   Eyes: Negative for pain and visual disturbance.  Respiratory: Negative for cough, chest tightness and shortness of breath.   Cardiovascular: Negative for chest pain, palpitations and leg swelling.  Gastrointestinal: Positive for constipation. Negative for abdominal pain, diarrhea and nausea.  Genitourinary: Negative for dysuria.       No dysuria now.  Recently evaluated by urology for urgency.   Musculoskeletal: Negative for joint swelling and myalgias.  Skin: Negative for color change and rash.  Neurological: Negative for dizziness, light-headedness and headaches.  Hematological: Negative for adenopathy. Does not bruise/bleed easily.  Psychiatric/Behavioral: Negative for agitation and dysphoric mood.       Still with increased stress.  Discussed psych referral.         Objective:    Physical Exam Vitals reviewed.  Constitutional:      General: She is not in acute distress.    Appearance: Normal appearance. She is well-developed.  HENT:     Head: Normocephalic and atraumatic.     Right Ear: External ear normal.     Left Ear: External ear normal.  Eyes:     General: No scleral icterus.       Right eye: No discharge.        Left eye: No  discharge.     Conjunctiva/sclera: Conjunctivae normal.  Neck:     Thyroid: No thyromegaly.  Cardiovascular:     Rate and Rhythm: Normal rate and regular rhythm.  Pulmonary:     Effort: No tachypnea, accessory muscle usage or respiratory distress.     Breath sounds: Normal breath sounds. No decreased breath sounds or wheezing.  Chest:     Breasts:        Right: No inverted nipple, mass, nipple discharge or tenderness (no axillary adenopathy).        Left: No inverted nipple, mass, nipple discharge or tenderness (no axilarry adenopathy).  Abdominal:     General: Bowel sounds are normal.     Palpations: Abdomen is soft.     Tenderness: There is no abdominal tenderness.  Musculoskeletal:        General: No swelling or tenderness.     Cervical back: Neck supple. No tenderness.  Lymphadenopathy:     Cervical: No cervical adenopathy.  Skin:    Findings: No erythema or rash.  Neurological:     Mental Status: She is alert and oriented to person, place, and time.  Psychiatric:        Mood and Affect: Mood normal.        Behavior: Behavior normal.     BP 118/64   Pulse 74   Temp 98 F (36.7 C)   Resp 16   Ht 5\' 5"  (1.651 m)   Wt 120 lb (54.4 kg)   SpO2 99%   BMI 19.97 kg/m  Wt Readings from Last 3 Encounters:  01/12/20 120 lb (54.4 kg)  12/21/19 119 lb (54 kg)  12/18/19 119 lb (54 kg)     Lab Results  Component Value Date   WBC 4.3 05/13/2019   HGB 12.8 05/13/2019   HCT 38.3 05/13/2019   PLT 178.0 05/13/2019   GLUCOSE 101 (H) 05/13/2019   CHOL 185 05/13/2019   TRIG 100.0 05/13/2019   HDL 59.30 05/13/2019   LDLCALC 105 (H) 05/13/2019  ALT 8 05/13/2019   AST 21 05/13/2019   NA 142 05/13/2019   K 3.7 05/13/2019   CL 106 05/13/2019   CREATININE 0.90 05/13/2019   BUN 14 05/13/2019   CO2 31 05/13/2019   TSH 3.35 05/13/2019   HGBA1C 5.5 01/15/2018      Assessment & Plan:   Problem List Items Addressed This Visit    Anxiety    Increased stress, anxiety and  depression - history of.  On cymbalta and buspar.  Has good support.  Refer to psych as outlined.        Relevant Medications   busPIRone (BUSPAR) 10 MG tablet   Other Relevant Orders   Ambulatory referral to Psychiatry   Constipation    Persistent issue.  Has seen GI.  Refer back for further evaluation.  Has tried multiple medications.       Relevant Orders   Ambulatory referral to Gastroenterology   GERD (gastroesophageal reflux disease)    Aciphex.  No upper symptoms reported.        Health care maintenance    Physical today 01/12/20.  Mammogram 05/25/19 - birads I.  Colonoscopy 09/2018 - one 27mm polyp in the mid sigmoid colon (negative pathology).  Recommended f/u colonoscopy in 5 years.        Leukopenia    Follow cbc.        Migraine headache    Followed by neurology.  Stable.  Continue inderal and topamax.  Has imitrex to take prn.        Mild depression ([place])    On cymbalta and takes buspar.  Does feel better.  Still with some issues.  Brings in name - in her insurance network - Dr Chauncey Cruel [phone].  Referral placed.        Relevant Medications   busPIRone (BUSPAR) 10 MG tablet   Other Relevant Orders   Ambulatory referral to Psychiatry    Other Visit Diagnoses    Routine general medical examination at a health care facility    -  Primary       Einar Pheasant, MD

## 2020-01-12 NOTE — Assessment & Plan Note (Signed)
Physical today 01/12/20.  Mammogram 05/25/19 - birads I.  Colonoscopy 09/2018 - one 83mm polyp in the mid sigmoid colon (negative pathology).  Recommended f/u colonoscopy in 5 years.

## 2020-01-17 ENCOUNTER — Encounter: Payer: Self-pay | Admitting: Internal Medicine

## 2020-01-17 NOTE — Assessment & Plan Note (Signed)
Persistent issue.  Has seen GI.  Refer back for further evaluation.  Has tried multiple medications.

## 2020-01-17 NOTE — Assessment & Plan Note (Signed)
Followed by neurology.  Stable.  Continue inderal and topamax.  Has imitrex to take prn.

## 2020-01-17 NOTE — Assessment & Plan Note (Signed)
Follow [facility]bc.  

## 2020-01-17 NOTE — Assessment & Plan Note (Signed)
Increased stress, anxiety and depression - history of.  On cymbalta and buspar.  Has good support.  Refer to psych as outlined.

## 2020-01-17 NOTE — Assessment & Plan Note (Signed)
Aciphex.  No upper symptoms reported.

## 2020-01-17 NOTE — Assessment & Plan Note (Signed)
On cymbalta and takes buspar.  Does feel better.  Still with some issues.  Brings in name - in her insurance network - Dr Chauncey Cruel [phone].  Referral placed.

## 2020-01-27 ENCOUNTER — Telehealth: Payer: Self-pay | Admitting: Internal Medicine

## 2020-01-27 NOTE — Telephone Encounter (Signed)
LM for Nikki to call back

## 2020-01-27 NOTE — Telephone Encounter (Signed)
Nikki with [facility] called and states that pt canceled consult because of insurance purposes. Pt has to go to therpay before consult? Please call Lexine Baton back at [phone]

## 2020-01-28 ENCOUNTER — Telehealth: Payer: Self-pay | Admitting: Internal Medicine

## 2020-01-28 MED ORDER — ALPRAZOLAM 0.25 MG PO TABS: ORAL_TABLET | ORAL | 1 refills | Status: DC

## 2020-01-28 MED ORDER — DULOXETINE HCL 60 MG PO CPEP: 60.0000 mg | ORAL_CAPSULE | Freq: Every day | ORAL | 0 refills | Status: DC

## 2020-01-28 NOTE — Telephone Encounter (Signed)
rx ok'd and sent in for xanax #30 with one refill.

## 2020-01-28 NOTE — Telephone Encounter (Signed)
Pt called in need refill on ALPRAZolam (XANAX) 0.25 MG tablet   ,DULoxetine (CYMBALTA) 60 MG capsule

## 2020-01-28 NOTE — Telephone Encounter (Signed)
Noted  

## 2020-01-28 NOTE — Telephone Encounter (Signed)
Refill request for xanax, last seen 01-12-20 , last filled 12-01-19.  Please advise.

## 2020-02-05 ENCOUNTER — Ambulatory Visit: Payer: Medicare Other

## 2020-02-05 ENCOUNTER — Telehealth: Payer: Self-pay | Admitting: Internal Medicine

## 2020-02-05 MED ORDER — RABEPRAZOLE SODIUM 20 MG PO TBEC: 20.0000 mg | DELAYED_RELEASE_TABLET | Freq: Two times a day (BID) | ORAL | 1 refills | Status: DC

## 2020-02-05 NOTE — Telephone Encounter (Signed)
Pt needs a refill on RABEprazole (ACIPHEX) 20 MG tablet sent to [facility] mail order

## 2020-03-01 ENCOUNTER — Telehealth: Payer: Self-pay | Admitting: Internal Medicine

## 2020-03-01 NOTE — Telephone Encounter (Signed)
FYI-I called pt to follow up on referral to Psychiatry pt states that Dr Reece Levy did not take her ins. Pt is scheduled with Dr Ander Slade on [facility] [address] [facility] on 03/28/2020.

## 2020-04-26 ENCOUNTER — Ambulatory Visit (INDEPENDENT_AMBULATORY_CARE_PROVIDER_SITE_OTHER): Payer: Medicare Other

## 2020-04-26 VITALS — Ht 65.0 in | Wt 120.0 lb

## 2020-04-26 DIAGNOSIS — Z1159 Encounter for screening for other viral diseases: Secondary | ICD-10-CM | POA: Diagnosis not present

## 2020-04-26 DIAGNOSIS — Z0000 Encounter for general adult medical examination without abnormal findings: Secondary | ICD-10-CM

## 2020-04-26 NOTE — Patient Instructions (Addendum)
Ms. Brandi Ramos , Thank you for taking time to come for your Medicare Wellness Visit. I appreciate your ongoing commitment to your health goals. Please review the following plan we discussed and let me know if I can assist you in the future.   These are the goals we discussed: Goals      Patient Stated   .  Follow up with Primary Care Provider (pt-stated)      Keep all routine scheduled appointments and follow up as needed.       This is a list of the screening recommended for you and due dates:  Health Maintenance  Topic Date Due  .  Hepatitis C: One time screening is recommended by Center for Disease Control  (CDC) for  adults born from [birth year] through 1963.   Never done  . Flu Shot  07/27/2020*  . DEXA scan (bone density measurement)  04/26/2021*  . Tetanus Vaccine  04/26/2021*  . Mammogram  05/24/2020  . Colon Cancer Screening  09/26/2028  . COVID-19 Vaccine  Completed  . Pneumonia vaccines  Completed  *Topic was postponed. The date shown is not the original due date.     Immunizations Immunization History  Administered Date(s) Administered  . Influenza Split 06/15/2014  . Influenza, High Dose Seasonal PF 10/11/2016, 05/17/2017, 06/30/2018, 06/04/2019  . Influenza,inj,Quad PF,6+ Mos 07/01/2013, 06/27/2015  . Influenza-Unspecified 06/30/2018  . Moderna SARS-COVID-2 Vaccination 10/19/2019, 11/16/2019  . Pneumococcal Conjugate-13 11/13/2016  . Pneumococcal Polysaccharide-23 08/27/2018  . Zoster 05/11/2014   Keep all routine maintenance appointments.   Follow up 04/27/20 @ 9:00 virtual visit  Advanced directives: none  Hepatitis C Screening- future lab ordered  Conditions/risks identified: none new  Follow up in one year for your annual wellness visit    Preventive Care 65 Years and Older, Female Preventive care refers to lifestyle choices and visits with your health care provider that can promote health and wellness. What does preventive care include?  A yearly  physical exam. This is also called an annual well check.  Dental exams once or twice a year.  Routine eye exams. Ask your health care provider how often you should have your eyes checked.  Personal lifestyle choices, including:  Daily care of your teeth and gums.  Regular physical activity.  Eating a healthy diet.  Avoiding tobacco and drug use.  Limiting alcohol use.  Practicing safe sex.  Taking low-dose aspirin every day.  Taking vitamin and mineral supplements as recommended by your health care provider. What happens during an annual well check? The services and screenings done by your health care provider during your annual well check will depend on your age, overall health, lifestyle risk factors, and family history of disease. Counseling  Your health care provider may ask you questions about your:  Alcohol use.  Tobacco use.  Drug use.  Emotional well-being.  Home and relationship well-being.  Sexual activity.  Eating habits.  History of falls.  Memory and ability to understand (cognition).  Work and work Statistician.  Reproductive health. Screening  You may have the following tests or measurements:  Height, weight, and BMI.  Blood pressure.  Lipid and cholesterol levels. These may be checked every 5 years, or more frequently if you are over 23 years old.  Skin check.  Lung cancer screening. You may have this screening every year starting at age 41 if you have a 30-pack-year history of smoking and currently smoke or have quit within the past 15 years.  Fecal occult blood  test (FOBT) of the stool. You may have this test every year starting at age 60.  Flexible sigmoidoscopy or colonoscopy. You may have a sigmoidoscopy every 5 years or a colonoscopy every 10 years starting at age 53.  Hepatitis C blood test.  Hepatitis B blood test.  Sexually transmitted disease (STD) testing.  Diabetes screening. This is done by checking your blood sugar  (glucose) after you have not eaten for a while (fasting). You may have this done every 1-3 years.  Bone density scan. This is done to screen for osteoporosis. You may have this done starting at age 61.  Mammogram. This may be done every 1-2 years. Talk to your health care provider about how often you should have regular mammograms. Talk with your health care provider about your test results, treatment options, and if necessary, the need for more tests. Vaccines  Your health care provider may recommend certain vaccines, such as:  Influenza vaccine. This is recommended every year.  Tetanus, diphtheria, and acellular pertussis (Tdap, Td) vaccine. You may need a Td booster every 10 years.  Zoster vaccine. You may need this after age 89.  Pneumococcal 13-valent conjugate (PCV13) vaccine. One dose is recommended after age 56.  Pneumococcal polysaccharide (PPSV23) vaccine. One dose is recommended after age 8. Talk to your health care provider about which screenings and vaccines you need and how often you need them. This information is not intended to replace advice given to you by your health care provider. Make sure you discuss any questions you have with your health care provider. Document Released: 09/23/2015 Document Revised: 05/16/2016 Document Reviewed: 06/28/2015 Elsevier Interactive Patient Education  2017 [employer] Prevention in the Home Falls can cause injuries. They can happen to people of all ages. There are many things you can do to make your home safe and to help prevent falls. What can I do on the outside of my home?  Regularly fix the edges of walkways and driveways and fix any cracks.  Remove anything that might make you trip as you walk through a door, such as a raised step or threshold.  Trim any bushes or trees on the path to your home.  Use bright outdoor lighting.  Clear any walking paths of anything that might make someone trip, such as rocks or  tools.  Regularly check to see if handrails are loose or broken. Make sure that both sides of any steps have handrails.  Any raised decks and porches should have guardrails on the edges.  Have any leaves, snow, or ice cleared regularly.  Use sand or salt on walking paths during winter.  Clean up any spills in your garage right away. This includes oil or grease spills. What can I do in the bathroom?  Use night lights.  Install grab bars by the toilet and in the tub and shower. Do not use towel bars as grab bars.  Use non-skid mats or decals in the tub or shower.  If you need to sit down in the shower, use a plastic, non-slip stool.  Keep the floor dry. Clean up any water that spills on the floor as soon as it happens.  Remove soap buildup in the tub or shower regularly.  Attach bath mats securely with double-sided non-slip rug tape.  Do not have throw rugs and other things on the floor that can make you trip. What can I do in the bedroom?  Use night lights.  Make sure that you have a  light by your bed that is easy to reach.  Do not use any sheets or blankets that are too big for your bed. They should not hang down onto the floor.  Have a firm chair that has side arms. You can use this for support while you get dressed.  Do not have throw rugs and other things on the floor that can make you trip. What can I do in the kitchen?  Clean up any spills right away.  Avoid walking on wet floors.  Keep items that you use a lot in easy-to-reach places.  If you need to reach something above you, use a strong step stool that has a grab bar.  Keep electrical cords out of the way.  Do not use floor polish or wax that makes floors slippery. If you must use wax, use non-skid floor wax.  Do not have throw rugs and other things on the floor that can make you trip. What can I do with my stairs?  Do not leave any items on the stairs.  Make sure that there are handrails on both  sides of the stairs and use them. Fix handrails that are broken or loose. Make sure that handrails are as long as the stairways.  Check any carpeting to make sure that it is firmly attached to the stairs. Fix any carpet that is loose or worn.  Avoid having throw rugs at the top or bottom of the stairs. If you do have throw rugs, attach them to the floor with carpet tape.  Make sure that you have a light switch at the top of the stairs and the bottom of the stairs. If you do not have them, ask someone to add them for you. What else can I do to help prevent falls?  Wear shoes that:  Do not have high heels.  Have rubber bottoms.  Are comfortable and fit you well.  Are closed at the toe. Do not wear sandals.  If you use a stepladder:  Make sure that it is fully opened. Do not climb a closed stepladder.  Make sure that both sides of the stepladder are locked into place.  Ask someone to hold it for you, if possible.  Clearly mark and make sure that you can see:  Any grab bars or handrails.  First and last steps.  Where the edge of each step is.  Use tools that help you move around (mobility aids) if they are needed. These include:  Canes.  Walkers.  Scooters.  Crutches.  Turn on the lights when you go into a dark area. Replace any light bulbs as soon as they burn out.  Set up your furniture so you have a clear path. Avoid moving your furniture around.  If any of your floors are uneven, fix them.  If there are any pets around you, be aware of where they are.  Review your medicines with your doctor. Some medicines can make you feel dizzy. This can increase your chance of falling. Ask your doctor what other things that you can do to help prevent falls. This information is not intended to replace advice given to you by your health care provider. Make sure you discuss any questions you have with your health care provider. Document Released: 06/23/2009 Document Revised:  02/02/2016 Document Reviewed: 10/01/2014 Elsevier Interactive Patient Education  2017 [employer].

## 2020-04-26 NOTE — Progress Notes (Addendum)
Subjective:   Brandi Ramos is a 74 y.o. female who presents for Medicare Annual (Subsequent) preventive examination.  Review of Systems    No ROS.  Medicare Wellness Virtual Visit.   Cardiac Risk Factors include: advanced age (>40men, >92 women)     Objective:    Today's Vitals   04/26/20 1403  Weight: 120 lb (54.4 kg)  Height: 5\' 5"  (1.651 m)   Body mass index is 19.97 kg/m.  Advanced Directives 04/26/2020 11/28/2019 02/04/2019 10/29/2018 10/27/2018 10/27/2018 09/26/2018  Does Patient Have a Medical Advance Directive? No No No No No Yes No  Does patient want to make changes to medical advance directive? - - - - - No - Patient declined -  Would patient like information on creating a medical advance directive? No - Patient declined - No - Patient declined No - Patient declined No - Patient declined - -    Current Medications (verified) Outpatient Encounter Medications as of 04/26/2020  Medication Sig  . AIMOVIG 140 DOSE 70 MG/ML SOAJ INJ 1 SYRINGE UTD Q 4 WKS  . ALPRAZolam (XANAX) 0.25 MG tablet TAKE 1 TABLET BY MOUTH EVERY DAY AS NEEDED  . busPIRone (BUSPAR) 10 MG tablet Take 1 tablet (10 mg total) by mouth daily.  . DULoxetine (CYMBALTA) 60 MG capsule Take 1 capsule (60 mg total) by mouth daily.  . fluticasone (FLONASE) 50 MCG/ACT nasal spray SHAKE LIQUID AND USE 2 SPRAYS IN EACH NOSTRIL DAILY  . loratadine (CLARITIN) 10 MG tablet Take 10 mg by mouth daily.  Marland Kitchen oxybutynin (DITROPAN-XL) 10 MG 24 hr tablet Take 1 tablet (10 mg total) by mouth daily. (Patient not taking: Reported on 04/26/2020)  . promethazine (PHENERGAN) 12.5 MG tablet   . propranolol ER (INDERAL LA) 60 MG 24 hr capsule TK 1 C PO D  . RABEprazole (ACIPHEX) 20 MG tablet Take 1 tablet (20 mg total) by mouth 2 (two) times daily before a meal.  . rizatriptan (MAXALT) 10 MG tablet Take 10 mg by mouth as needed. May repeat in 2 hours if needed  . SUMAtriptan (IMITREX) 100 MG tablet   . topiramate (TOPAMAX) 50 MG tablet  TAKE 1 AND 1/2 TABLET BY MOUTH IN MORNING AND 1 TABLET BY MOUTH IN EVENING  . [DISCONTINUED] LINZESS 145 MCG CAPS capsule Take 145 mcg by mouth daily.  . [DISCONTINUED] sucralfate (CARAFATE) 1 g tablet Take 1 tablet (1 g total) by mouth 2 (two) times daily as needed. (Patient taking differently: Take 1 g by mouth 2 (two) times daily. )   No facility-administered encounter medications on file as of 04/26/2020.    Allergies (verified) Amitiza [lubiprostone] and Sulfa antibiotics   History: Past Medical History:  Diagnosis Date  . Anxiety   . Arthritis   . Chicken pox   . Depression   . Diverticulosis   . Fibrocystic breast disease   . Frozen shoulder   . Fundic gland polyps of stomach, benign   . GERD (gastroesophageal reflux disease)   . History of diverticulitis of colon   . Kidney stones   . Migraine headache   . Neutropenia ([place])    worked up - Dr Oliva Bustard, slightly elevated ANA  . PUD (peptic ulcer disease)   . Recurrent vaginitis   . Seasonal allergies   . 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    Past Surgical History:  Procedure Laterality Date  . ABDOMINAL HYSTERECTOMY  1984  . COLONOSCOPY    .  COLONOSCOPY WITH PROPOFOL N/A 09/26/2018   Procedure: COLONOSCOPY WITH PROPOFOL;  Surgeon: Lollie Sails, MD;  Location[facility] ENDOSCOPY;  Service: Endoscopy;  Laterality: N/A;  . ESOPHAGOGASTRODUODENOSCOPY (EGD) WITH PROPOFOL N/A 10/31/2015   Procedure: ESOPHAGOGASTRODUODENOSCOPY (EGD) WITH PROPOFOL;  Surgeon: Lollie Sails, MD;  Location: [facility] ENDOSCOPY;  Service: Endoscopy;  Laterality: N/A;  . ESOPHAGOGASTRODUODENOSCOPY (EGD) WITH PROPOFOL N/A 09/26/2018   Procedure: ESOPHAGOGASTRODUODENOSCOPY (EGD) WITH PROPOFOL;  Surgeon: Lollie Sails, MD;  Location: [facility] ENDOSCOPY;  Service: Endoscopy;  Laterality: N/A;  . LAPAROSCOPIC APPENDECTOMY N/A 10/29/2018   Procedure: APPENDECTOMY LAPAROSCOPIC;  Surgeon: Herbert Pun, MD;  Location: [facility]  ORS;  Service: General;  Laterality: N/A;  . LAPAROSCOPIC BILATERAL SALPINGO OOPHERECTOMY Bilateral 10/29/2018   Procedure: LAPAROSCOPIC BILATERAL SALPINGO OOPHORECTOMY;  Surgeon: Ward, Honor Loh, MD;  Location: [facility] ORS;  Service: Gynecology;  Laterality: Bilateral;   Family History  Problem Relation Age of Onset  . CAD Mother        s/p CABG  . Hypercholesterolemia Mother   . Breast cancer Mother 31  . Heart disease Mother   . Lung cancer Mother   . Hypertension Father   . Migraines Father   . Hypercholesterolemia Father   . Lung cancer Father   . Migraines Sister   . Arthritis Sister   . Lymphoma Other        grandmother  . Colon cancer Neg Hx    Social History   Socioeconomic History  . Marital status: Widowed    Spouse name: Not on file  . Number of children: 2  . Years of education: Not on file  . Highest education level: Not on file  Occupational History  . Not on file  Tobacco Use  . Smoking status: Never Smoker  . Smokeless tobacco: Never Used  Vaping Use  . Vaping Use: Never used  Substance and Sexual Activity  . Alcohol use: Yes    Alcohol/week: 0.0 standard drinks    Comment: wine occassionally  . Drug use: No  . Sexual activity: Not Currently  Other Topics Concern  . Not on file  Social History Narrative  . Not on file   Social Determinants of Health   Financial Resource Strain: Low Risk   . Difficulty of Paying Living Expenses: Not hard at all  Food Insecurity: No Food Insecurity  . Worried About Charity fundraiser in the Last Year: Never true  . Ran Out of Food in the Last Year: Never true  Transportation Needs: No Transportation Needs  . Lack of Transportation (Medical): No  . Lack of Transportation (Non-Medical): No  Physical Activity:   . Days of Exercise per Week:   . Minutes of Exercise per Session:   Stress:   . Feeling of Stress :   Social Connections: Moderately Integrated  . Frequency of Communication with Friends and Family:  More than three times a week  . Frequency of Social Gatherings with Friends and Family: More than three times a week  . Attends Religious Services: More than 4 times per year  . Active Member of Clubs or Organizations: Yes  . Attends Archivist Meetings: More than 4 times per year  . Marital Status: Widowed    Tobacco Counseling Counseling given: Not Answered   Clinical Intake:  Pre-visit preparation completed: Yes        Diabetes: No  How often do you need to have someone help you when you read instructions, pamphlets, or other written materials from  your doctor or pharmacy?: 1 - Never  Interpreter Needed?: No      Activities of Daily Living In your present state of health, do you have any difficulty performing the following activities: 04/26/2020  Hearing? N  Vision? N  Difficulty concentrating or making decisions? N  Walking or climbing stairs? N  Dressing or bathing? N  Doing errands, shopping? N  Preparing Food and eating ? N  Using the Toilet? N  In the past six months, have you accidently leaked urine? N  Do you have problems with loss of bowel control? N  Managing your Medications? N  Managing your Finances? N  Housekeeping or managing your Housekeeping? N  Some recent data might be hidden    Patient Care Team: Einar Pheasant, MD as PCP - General (Internal Medicine)  Indicate any recent Medical Services you may have received from other than [facility] providers in the past year (date may be approximate).     Assessment:   This is a routine wellness examination for Brandi Ramos.  I connected with Brandi Ramos today by telephone and verified that I am speaking with the correct person using two identifiers. Location patient: home Location provider: work Persons participating in the virtual visit: patient, Marine scientist.    I discussed the limitations, risks, security and privacy concerns of performing an evaluation and management service by telephone and the  availability of in person appointments. The patient expressed understanding and verbally consented to this telephonic visit.    Interactive audio and video telecommunications were attempted between this provider and patient, however failed, due to patient having technical difficulties OR patient did not have access to video capability.  We continued and completed visit with audio only.  Some vital signs may be absent or patient reported.   Hearing/Vision screen  Hearing Screening   125Hz  250Hz  500Hz  1000Hz  2000Hz  3000Hz  4000Hz  6000Hz  8000Hz   Right ear:           Left ear:           Comments: Patient is able to hear conversational tones without difficulty.  No issues reported.  Vision Screening Comments: Wears corrective lenses Visual acuity not assessed, virtual visit.  They have seen their ophthalmologist in the last 12 months.    Dietary issues and exercise activities discussed: Current Exercise Habits: Home exercise routine, Intensity: Mild  Healthy diet Fair water intake  Goals      Patient Stated   .  Follow up with Primary Care Provider (pt-stated)      Keep all routine scheduled appointments and follow up as needed.      Depression Screen PHQ 2/9 Scores 04/26/2020 01/12/2020 02/04/2019 11/28/2017 11/13/2016 06/14/2016 01/05/2016  PHQ - 2 Score - 0 0 2 0 0 0  PHQ- 9 Score - 0 - 2 - - 3  Exception Documentation Other- indicate reason in comment box - - - - - -  Not completed Counseling sessions every month - - - - - -    Fall Risk Fall Risk  04/26/2020 10/13/2019 02/04/2019 11/28/2017 11/13/2016  Falls in the past year? 0 0 0 No No  Number falls in past yr: 0 - - - -  Follow up Falls evaluation completed Falls evaluation completed - - -    Handrails in use when climbing stairs? Yes  Home free of loose throw rugs in walkways, pet beds, electrical cords, etc? Yes  Adequate lighting in your home to reduce risk of falls? Yes   ASSISTIVE DEVICES UTILIZED TO  PREVENT FALLS:  Life  alert? No  Use of a cane, walker or w/c? No  Grab bars in the bathroom? No  Shower chair or bench in shower? No  Elevated toilet seat or a handicapped toilet? No   TIMED UP AND GO:  Was the test performed? No . Virtual visit.  Cognitive Function: MMSE - Mini Mental State Exam 11/13/2016  Orientation to time 5  Orientation to Place 5  Registration 3  Attention/ Calculation 5  Recall 3  Language- name 2 objects 2  Language- repeat 1  Language- follow 3 step command 3  Language- read & follow direction 1  Write a sentence 1  Copy design 1  Total score 30     6CIT Screen 04/26/2020 02/04/2019  What Year? 0 points 0 points  What month? 0 points 0 points  What time? - 0 points  Count back from 20 - 0 points  Months in reverse 0 points 0 points  Repeat phrase 0 points 0 points  Total Score - 0    Immunizations Immunization History  Administered Date(s) Administered  . Influenza Split 06/15/2014  . Influenza, High Dose Seasonal PF 10/11/2016, 05/17/2017, 06/30/2018, 06/04/2019  . Influenza,inj,Quad PF,6+ Mos 07/01/2013, 06/27/2015  . Influenza-Unspecified 06/30/2018  . Moderna SARS-COVID-2 Vaccination 10/19/2019, 11/16/2019  . Pneumococcal Conjugate-13 11/13/2016  . Pneumococcal Polysaccharide-23 08/27/2018  . Zoster 05/11/2014    TDAP status: Due, Education has been provided regarding the importance of this vaccine. Advised may receive this vaccine at local pharmacy or Health Dept. Aware to provide a copy of the vaccination record if obtained from local pharmacy or Health Dept. Verbalized acceptance and understanding. Deferred.   Health Maintenance Health Maintenance  Topic Date Due  . Hepatitis C Screening  Never done  . INFLUENZA VACCINE  07/27/2020 (Originally 04/10/2020)  . DEXA SCAN  04/26/2021 (Originally 07/06/2013)  . TETANUS/TDAP  04/26/2021 (Originally [birth year]07/1967)  . MAMMOGRAM  05/24/2020  . COLONOSCOPY  09/26/2028  . COVID-19 Vaccine  Completed  . PNA vac  Low Risk Adult  Completed   Hepatiis C Screening- consent given. Future lab placed.  Influenza vaccine- out of stock.  Shingrix- plans to discuss more with PMD.  Dexa Scan- deferred per patient.   Dental Screening: Recommended annual dental exams for proper oral hygiene. Visits every 6 months.   Community Resource Referral / Chronic Care Management: CRR required this visit?  No   CCM required this visit?  No      Plan:   Keep all routine maintenance appointments.   Follow up 04/27/20 @ 9:00 virtual visit  I have personally reviewed and noted the following in the patient's chart:   . Medical and social history . Use of alcohol, tobacco or illicit drugs  . Current medications and supplements . Functional ability and status . Nutritional status . Physical activity . Advanced directives . List of other physicians . Hospitalizations, surgeries, and ER visits in previous 12 months . Vitals . Screenings to include cognitive, depression, and falls . Referrals and appointments  In addition, I have reviewed and discussed with patient certain preventive protocols, quality metrics, and best practice recommendations. A written personalized care plan for preventive services as well as general preventive health recommendations were provided to patient via mychart.     Varney Biles, LPN   6/80/3212     Reviewed above information.  Agree with assessment and plan.    Dr Nicki Reaper

## 2020-04-27 ENCOUNTER — Telehealth (INDEPENDENT_AMBULATORY_CARE_PROVIDER_SITE_OTHER): Payer: Medicare Other | Admitting: Internal Medicine

## 2020-04-27 ENCOUNTER — Encounter: Payer: Self-pay | Admitting: Internal Medicine

## 2020-04-27 VITALS — Ht 65.0 in | Wt 119.0 lb

## 2020-04-27 DIAGNOSIS — Z1231 Encounter for screening mammogram for malignant neoplasm of breast: Secondary | ICD-10-CM | POA: Diagnosis not present

## 2020-04-27 DIAGNOSIS — G43709 Chronic migraine without aura, not intractable, without status migrainosus: Secondary | ICD-10-CM

## 2020-04-27 DIAGNOSIS — Z13220 Encounter for screening for lipoid disorders: Secondary | ICD-10-CM

## 2020-04-27 DIAGNOSIS — F32A Depression, unspecified: Secondary | ICD-10-CM

## 2020-04-27 DIAGNOSIS — K219 Gastro-esophageal reflux disease without esophagitis: Secondary | ICD-10-CM

## 2020-04-27 DIAGNOSIS — F419 Anxiety disorder, unspecified: Secondary | ICD-10-CM

## 2020-04-27 DIAGNOSIS — D72819 Decreased white blood cell count, unspecified: Secondary | ICD-10-CM

## 2020-04-27 DIAGNOSIS — F320 Major depressive disorder, single episode, mild: Secondary | ICD-10-CM

## 2020-04-27 DIAGNOSIS — K5900 Constipation, unspecified: Secondary | ICD-10-CM

## 2020-04-27 MED ORDER — DULOXETINE HCL 60 MG PO CPEP: 60.0000 mg | ORAL_CAPSULE | Freq: Every day | ORAL | 1 refills | Status: DC

## 2020-04-27 NOTE — Progress Notes (Signed)
Patient ID: TACHA MANNI, female   DOB: 1946/07/19, 74 y.o.   MRN: [id number]   Virtual Visit via video Note  This visit type was conducted due to national recommendations for restrictions regarding the COVID-19 pandemic (e.g. social distancing).  This format is felt to be most appropriate for this patient at this time.  All issues noted in this document were discussed and addressed.  No physical exam was performed (except for noted visual exam findings with Video Visits).   I connected with Yolanda Bonine by a video and verified that I am speaking with the correct person using two identifiers. Location patient: home Location provider: work Persons participating in the virtual visit: patient, provider  The limitations, risks, security and privacy concerns of performing an evaluation and management service by video and the availability of in person appointments have been discussed.  It has also been discussed with the patient that there may be a patient responsible charge related to this service. The patient expressed understanding and agreed to proceed.   Reason for visit: scheduled follow up.   HPI: She reports she is doing relatively well.  Trying to stay active.  No chest pain or sob reported.  No abdominal pain.  Still having issues with constipation.  May have a bowel movement 1x/week - takes dulcolax and responds well to this.  Seeing GI.  Started [facility].  Not helping.  Plans to discuss with GI.  Seeing Ander Slade.  Added trazodone.  She is still taking buspar and cymbalta.  She is not requiring alprazolam regularly now.  Has only taken a couple of times in the last month.  Counseling going well.  Plans to get shingles vaccine.     ROS: See pertinent positives and negatives per HPI.  Past Medical History:  Diagnosis Date  . Anxiety   . Arthritis   . Chicken pox   . Depression   . Diverticulosis   . Fibrocystic breast disease   . Frozen shoulder   . Fundic gland polyps of  stomach, benign   . GERD (gastroesophageal reflux disease)   . History of diverticulitis of colon   . Kidney stones   . Migraine headache   . Neutropenia ([place])    worked up - Dr Oliva Bustard, slightly elevated ANA  . PUD (peptic ulcer disease)   . Recurrent vaginitis   . Seasonal allergies   . 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     Past Surgical History:  Procedure Laterality Date  . ABDOMINAL HYSTERECTOMY  1984  . COLONOSCOPY    . COLONOSCOPY WITH PROPOFOL N/A 09/26/2018   Procedure: COLONOSCOPY WITH PROPOFOL;  Surgeon: Lollie Sails, MD;  Location: [facility] ENDOSCOPY;  Service: Endoscopy;  Laterality: N/A;  . ESOPHAGOGASTRODUODENOSCOPY (EGD) WITH PROPOFOL N/A 10/31/2015   Procedure: ESOPHAGOGASTRODUODENOSCOPY (EGD) WITH PROPOFOL;  Surgeon: Lollie Sails, MD;  Location[facility] ENDOSCOPY;  Service: Endoscopy;  Laterality: N/A;  . ESOPHAGOGASTRODUODENOSCOPY (EGD) WITH PROPOFOL N/A 09/26/2018   Procedure: ESOPHAGOGASTRODUODENOSCOPY (EGD) WITH PROPOFOL;  Surgeon: Lollie Sails, MD;  Location: [facility] ENDOSCOPY;  Service: Endoscopy;  Laterality: N/A;  . LAPAROSCOPIC APPENDECTOMY N/A 10/29/2018   Procedure: APPENDECTOMY LAPAROSCOPIC;  Surgeon: Herbert Pun, MD;  Location: [facility] ORS;  Service: General;  Laterality: N/A;  . LAPAROSCOPIC BILATERAL SALPINGO OOPHERECTOMY Bilateral 10/29/2018   Procedure: LAPAROSCOPIC BILATERAL SALPINGO OOPHORECTOMY;  Surgeon: Ward, Honor Loh, MD;  Location: [facility] ORS;  Service: Gynecology;  Laterality: Bilateral;    Family History  Problem Relation Age of Onset  . CAD Mother        s/p CABG  . Hypercholesterolemia Mother   . Breast cancer Mother 64  . Heart disease Mother   . Lung cancer Mother   . Hypertension Father   . Migraines Father   . Hypercholesterolemia Father   . Lung cancer Father   . Migraines Sister   . Arthritis Sister   . Lymphoma Other        grandmother  . Colon cancer Neg Hx     SOCIAL HX:  reviewed.    Current Outpatient Medications:  .  AIMOVIG 140 DOSE 70 MG/ML SOAJ, INJ 1 SYRINGE UTD Q 4 WKS, Disp: , Rfl: 11 .  ALPRAZolam (XANAX) 0.25 MG tablet, TAKE 1 TABLET BY MOUTH EVERY DAY AS NEEDED, Disp: 30 tablet, Rfl: 1 .  busPIRone (BUSPAR) 10 MG tablet, Take 1 tablet (10 mg total) by mouth daily., Disp: 30 tablet, Rfl: 2 .  DULoxetine (CYMBALTA) 60 MG capsule, Take 1 capsule (60 mg total) by mouth daily., Disp: 90 capsule, Rfl: 1 .  fluticasone (FLONASE) 50 MCG/ACT nasal spray, SHAKE LIQUID AND USE 2 SPRAYS IN EACH NOSTRIL DAILY, Disp: 16 g, Rfl: 11 .  loratadine (CLARITIN) 10 MG tablet, Take 10 mg by mouth daily., Disp: , Rfl:  .  oxybutynin (DITROPAN-XL) 10 MG 24 hr tablet, Take 1 tablet (10 mg total) by mouth daily. (Patient not taking: Reported on 04/26/2020), Disp: 14 tablet, Rfl: 0 .  promethazine (PHENERGAN) 12.5 MG tablet, , Disp: , Rfl:  .  propranolol ER (INDERAL LA) 60 MG 24 hr capsule, TK 1 C PO D, Disp: , Rfl:  .  RABEprazole (ACIPHEX) 20 MG tablet, Take 1 tablet (20 mg total) by mouth 2 (two) times daily before a meal., Disp: 180 tablet, Rfl: 1 .  rizatriptan (MAXALT) 10 MG tablet, Take 10 mg by mouth as needed. May repeat in 2 hours if needed, Disp: , Rfl:  .  SUMAtriptan (IMITREX) 100 MG tablet, , Disp: , Rfl:  .  topiramate (TOPAMAX) 50 MG tablet, TAKE 1 AND 1/2 TABLET BY MOUTH IN MORNING AND 1 TABLET BY MOUTH IN EVENING, Disp: , Rfl:   EXAM:  GENERAL: alert, oriented, appears well and in no acute distress  HEENT: atraumatic, conjunttiva clear, no obvious abnormalities on inspection of external nose and ears  NECK: normal movements of the head and neck  LUNGS: on inspection no signs of respiratory distress, breathing rate appears normal, no obvious gross SOB, gasping or wheezing  CV: no obvious cyanosis  PSYCH/NEURO: pleasant and cooperative, no obvious depression or anxiety, speech and thought processing grossly intact  ASSESSMENT AND PLAN:  Discussed  the following assessment and plan:  Mild depression ([place]) Doing better. Taking cymbalta and buspar.  Trazodone recently added.  Not needing the xanax as previous.  Follow.    Migraine headache Has been followed by neurology.  Continues on inderal and topamax.  Stable.    Leukopenia Stable.  Follow cbc.   GERD (gastroesophageal reflux disease) No upper symptoms reported.  On aciphex.   Constipation Persistent issue.  Has seen GI.  Dulcolax works.  Plans to discuss with GI.   Anxiety History of increased stress and anxiety.  On cymbalta and buspar.  Trazodone recently added.  Not needing xanax.  Doing better.  Continue f/u with Ander Slade.  Follow.    Orders Placed This Encounter  Procedures  . MM 3D SCREEN BREAST BILATERAL  Standing Status:   Future    Standing Expiration Date:   04/27/2021    Order Specific Question:   Reason for Exam (SYMPTOM  OR DIAGNOSIS REQUIRED)    Answer:   [facility] MAMMO    Order Specific Question:   Preferred imaging location?    Answer:   [facility]  . CBC with Differential/Platelet    Standing Status:   Future    Standing Expiration Date:   05/08/2021  . Comprehensive metabolic panel    Standing Status:   Future    Standing Expiration Date:   05/08/2021  . Lipid panel    Standing Status:   Future    Standing Expiration Date:   05/08/2021  . TSH    Standing Status:   Future    Standing Expiration Date:   05/08/2021    Meds ordered this encounter  Medications  . DULoxetine (CYMBALTA) 60 MG capsule    Sig: Take 1 capsule (60 mg total) by mouth daily.    Dispense:  90 capsule    Refill:  1     I discussed the assessment and treatment plan with the patient. The patient was provided an opportunity to ask questions and all were answered. The patient agreed with the plan and demonstrated an understanding of the instructions.   The patient was advised to call back or seek an in-person evaluation if the symptoms worsen or if the condition fails  to improve as anticipated.   Einar Pheasant, MD

## 2020-05-08 ENCOUNTER — Encounter: Payer: Self-pay | Admitting: Internal Medicine

## 2020-05-08 NOTE — Assessment & Plan Note (Signed)
Has been followed by neurology.  Continues on inderal and topamax.  Stable.

## 2020-05-08 NOTE — Assessment & Plan Note (Signed)
Doing better. Taking cymbalta and buspar.  Trazodone recently added.  Not needing the xanax as previous.  Follow.

## 2020-05-08 NOTE — Assessment & Plan Note (Signed)
No upper symptoms reported.  On aciphex.   

## 2020-05-08 NOTE — Assessment & Plan Note (Signed)
Stable.  Follow cbc.  

## 2020-05-08 NOTE — Assessment & Plan Note (Signed)
Persistent issue.  Has seen GI.  Dulcolax works.  Plans to discuss with GI.

## 2020-05-08 NOTE — Assessment & Plan Note (Signed)
History of increased stress and anxiety.  On cymbalta and buspar.  Trazodone recently added.  Not needing xanax.  Doing better.  Continue f/u with Ander Slade.  Follow.

## 2020-05-12 ENCOUNTER — Other Ambulatory Visit: Payer: Self-pay

## 2020-05-12 ENCOUNTER — Other Ambulatory Visit (INDEPENDENT_AMBULATORY_CARE_PROVIDER_SITE_OTHER): Payer: Medicare Other

## 2020-05-12 DIAGNOSIS — Z13220 Encounter for screening for lipoid disorders: Secondary | ICD-10-CM | POA: Diagnosis not present

## 2020-05-12 DIAGNOSIS — D72819 Decreased white blood cell count, unspecified: Secondary | ICD-10-CM | POA: Diagnosis not present

## 2020-05-12 DIAGNOSIS — K219 Gastro-esophageal reflux disease without esophagitis: Secondary | ICD-10-CM | POA: Diagnosis not present

## 2020-05-12 DIAGNOSIS — Z1159 Encounter for screening for other viral diseases: Secondary | ICD-10-CM

## 2020-05-12 LAB — CBC WITH DIFFERENTIAL/PLATELET
Basophils Absolute: 0 10*3/uL (ref 0.0–0.1)
Basophils Relative: 0.7 % (ref 0.0–3.0)
Eosinophils Absolute: 0.2 10*3/uL (ref 0.0–0.7)
Eosinophils Relative: 4.6 % (ref 0.0–5.0)
HCT: 36.2 % (ref 36.0–46.0)
Hemoglobin: 12.1 g/dL (ref 12.0–15.0)
Lymphocytes Relative: 38.5 % (ref 12.0–46.0)
Lymphs Abs: 1.3 10*3/uL (ref 0.7–4.0)
MCHC: 33.3 g/dL (ref 30.0–36.0)
MCV: 87.7 fl (ref 78.0–100.0)
Monocytes Absolute: 0.4 10*3/uL (ref 0.1–1.0)
Monocytes Relative: 10.6 % (ref 3.0–12.0)
Neutro Abs: 1.6 10*3/uL (ref 1.4–7.7)
Neutrophils Relative %: 45.6 % (ref 43.0–77.0)
Platelets: 153 10*3/uL (ref 150.0–400.0)
RBC: 4.13 Mil/uL (ref 3.87–5.11)
RDW: 14 % (ref 11.5–15.5)
WBC: 3.5 10*3/uL — ABNORMAL LOW (ref 4.0–10.5)

## 2020-05-12 LAB — COMPREHENSIVE METABOLIC PANEL
ALT: 16 U/L (ref 0–35)
AST: 33 U/L (ref 0–37)
Albumin: 4 g/dL (ref 3.5–5.2)
Alkaline Phosphatase: 54 U/L (ref 39–117)
BUN: 15 mg/dL (ref 6–23)
CO2: 31 mEq/L (ref 19–32)
Calcium: 9.3 mg/dL (ref 8.4–10.5)
Chloride: 106 mEq/L (ref 96–112)
Creatinine, Ser: 0.89 mg/dL (ref 0.40–1.20)
GFR: 62.37 mL/min (ref >60.00)
Glucose, Bld: 93 mg/dL (ref 70–99)
Potassium: 3.9 mEq/L (ref 3.5–5.1)
Sodium: 142 mEq/L (ref 135–145)
Total Bilirubin: 0.6 mg/dL (ref 0.2–1.2)
Total Protein: 6.2 g/dL (ref 6.0–8.3)

## 2020-05-12 LAB — LIPID PANEL
Cholesterol: 187 mg/dL (ref 0–200)
HDL: 55.4 mg/dL (ref >39.00)
LDL Cholesterol: 114 mg/dL — ABNORMAL HIGH (ref 0–99)
NonHDL: 132.07
Total CHOL/HDL Ratio: 3
Triglycerides: 89 mg/dL (ref 0.0–149.0)
VLDL: 17.8 mg/dL (ref 0.0–40.0)

## 2020-05-12 LAB — TSH: TSH: 3.11 u[IU]/mL (ref 0.35–4.50)

## 2020-05-13 LAB — HEPATITIS C ANTIBODY
Hepatitis C Ab: NONREACTIVE
SIGNAL TO CUT-OFF: 0.01 (ref <1.00)

## 2020-05-17 ENCOUNTER — Ambulatory Visit: Payer: Self-pay | Admitting: Urology

## 2020-05-22 NOTE — Progress Notes (Signed)
05/23/2020 2:24 PM   Brandi Ramos [birth year] [id number]  Referring provider: Einar Ramos, [address] [place],  [place] [address]  Chief Complaint  Patient presents with  . Follow-up    HPI: Brandi Ramos is a 74 year old female with a nephrolithiasis and a history of hematuria who presents today for a six month follow up.   Nephrolithiasis Contrast CT in 10/2018 noted there is a nonobstructing calculus in the lower pole of the right kidney measuring up to 7 mm on image 47/4. There are probable additional tiny renal calculi. No evidence of ureteral calculus, hydronephrosis or renal mass. The bladder appears unremarkable.  KUB 03/02/2019 Right nephrolithiasis is noted. No evidence of bowel obstruction or ileus.  KUB 10/20/2019 stable right renal calculus. No evidence of bowel obstruction or ileus.  KUB 05/23/2020 stable right lower pole calculus ~ 7 mm in length with a < 2 mm right upper pole stone.    History of hematuria (high risk) Non-smoker.  CTU 01/2018 noted adrenal glands normal.  Nonobstructive right nephrolithiasis includes a 3 mm upper pole calculus on image 73/5; an 8 mm in long axis lower pole calculus on image 63/5; and a 4 mm lower pole calculus on image 66/5.  Left-sided renal calculi include a 1-2 mm upper pole nonobstructive calculus and a 1-2 mm lower pole nonobstructive calculus. There is also faint calcification along the left renal capsule on image 60/5 medially.  No hydronephrosis, hydroureter, or other urinary tract calculus identified. No appreciable abnormal urothelial enhancement or significant additional filling defect along the urothelium. Much of the right ureter does not fill with contrast on the delayed images despite prone positioning.  On image 73/12 there is some cortical hypodensity in thinning in the left mid upper kidney adjacent to the spleen. Given the slightly scalloped contour I favor that this is probably from remote  prior scarring, and a similar appearance was present on 11/16/2015.  Cysto 02/2018 with Brandi. Erlene Ramos was negative.  Contrast CT in 10/2018 Both adrenal glands appear normal. There is a nonobstructing calculus in the lower pole of the right kidney measuring up to 7 mm on image 47/4. There are probable additional tiny renal calculi. No evidence of ureteral calculus, hydronephrosis or renal mass.  Cystoscopy with Brandi. Bernardo Ramos in 11/2019 was NED.  No reports of gross hematuria.  UA today negative for micro heme.     PMH: Past Medical History:  Diagnosis Date  . Anxiety   . Arthritis   . Chicken pox   . Depression   . Diverticulosis   . Fibrocystic breast disease   . Frozen shoulder   . Fundic gland polyps of stomach, benign   . GERD (gastroesophageal reflux disease)   . History of diverticulitis of colon   . Kidney stones   . Migraine headache   . Neutropenia ([place])    worked up - Brandi Ramos, slightly elevated ANA  . PUD (peptic ulcer disease)   . Recurrent vaginitis   . Seasonal allergies   . 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     Surgical History: Past Surgical History:  Procedure Laterality Date  . ABDOMINAL HYSTERECTOMY  1984  . COLONOSCOPY    . COLONOSCOPY WITH PROPOFOL N/A 09/26/2018   Procedure: COLONOSCOPY WITH PROPOFOL;  Surgeon: Lollie Sails, MD;  Location: [facility] ENDOSCOPY;  Service: Endoscopy;  Laterality: N/A;  . ESOPHAGOGASTRODUODENOSCOPY (EGD) WITH PROPOFOL N/A 10/31/2015  Procedure: ESOPHAGOGASTRODUODENOSCOPY (EGD) WITH PROPOFOL;  Surgeon: Lollie Sails, MD;  Location: [facility] ENDOSCOPY;  Service: Endoscopy;  Laterality: N/A;  . ESOPHAGOGASTRODUODENOSCOPY (EGD) WITH PROPOFOL N/A 09/26/2018   Procedure: ESOPHAGOGASTRODUODENOSCOPY (EGD) WITH PROPOFOL;  Surgeon: Lollie Sails, MD;  Location: [facility] ENDOSCOPY;  Service: Endoscopy;  Laterality: N/A;  . LAPAROSCOPIC APPENDECTOMY N/A 10/29/2018   Procedure: APPENDECTOMY LAPAROSCOPIC;  Surgeon:  Herbert Pun, MD;  Location: [facility] ORS;  Service: General;  Laterality: N/A;  . LAPAROSCOPIC BILATERAL SALPINGO OOPHERECTOMY Bilateral 10/29/2018   Procedure: LAPAROSCOPIC BILATERAL SALPINGO OOPHORECTOMY;  Surgeon: Ward, Honor Loh, MD;  Location: [facility] ORS;  Service: Gynecology;  Laterality: Bilateral;    Home Medications:  Allergies as of 05/23/2020      Reactions   Amitiza [lubiprostone] Other (See Comments)   Sulfa Antibiotics       Medication List       Accurate as of May 23, 2020  2:24 PM. If you have any questions, ask your nurse or doctor.        STOP taking these medications   oxybutynin 10 MG 24 hr tablet Commonly known as: DITROPAN-XL Stopped by: Brandi Cadena, PA-C     TAKE these medications   Aimovig (140 MG Dose) 70 MG/ML Soaj Generic drug: Erenumab-aooe INJ 1 SYRINGE UTD Q 4 WKS   ALPRAZolam 0.25 MG tablet Commonly known as: XANAX TAKE 1 TABLET BY MOUTH EVERY DAY AS NEEDED   busPIRone 10 MG tablet Commonly known as: BUSPAR Take 1 tablet (10 mg total) by mouth daily.   DULoxetine 60 MG capsule Commonly known as: CYMBALTA Take 1 capsule (60 mg total) by mouth daily.   fluticasone 50 MCG/ACT nasal spray Commonly known as: FLONASE SHAKE LIQUID AND USE 2 SPRAYS IN EACH NOSTRIL DAILY   loratadine 10 MG tablet Commonly known as: CLARITIN Take 10 mg by mouth daily.   promethazine 12.5 MG tablet Commonly known as: PHENERGAN   propranolol ER 60 MG 24 hr capsule Commonly known as: INDERAL LA TK 1 C PO D   RABEprazole 20 MG tablet Commonly known as: ACIPHEX Take 1 tablet (20 mg total) by mouth 2 (two) times daily before a meal.   rizatriptan 10 MG tablet Commonly known as: MAXALT Take 10 mg by mouth as needed. May repeat in 2 hours if needed   SUMAtriptan 100 MG tablet Commonly known as: IMITREX   topiramate 50 MG tablet Commonly known as: TOPAMAX TAKE 1 AND 1/2 TABLET BY MOUTH IN MORNING AND 1 TABLET BY MOUTH IN EVENING        Allergies:  Allergies  Allergen Reactions  . Amitiza [Lubiprostone] Other (See Comments)  . Sulfa Antibiotics     Family History: Family History  Problem Relation Age of Onset  . CAD Mother        s/p CABG  . Hypercholesterolemia Mother   . Breast cancer Mother 35  . Heart disease Mother   . Lung cancer Mother   . Hypertension Father   . Migraines Father   . Hypercholesterolemia Father   . Lung cancer Father   . Migraines Sister   . Arthritis Sister   . Lymphoma Other        grandmother  . Colon cancer Neg Hx     Social History:  reports that she has never smoked. She has never used smokeless tobacco. She reports current alcohol use. She reports that she does not use drugs.  ROS: For pertinent review of systems please refer to history of present  illness  Physical Exam: BP (!) 121/52   Pulse 66   Ht 5\' 4"  (1.626 m)   Wt 117 lb (53.1 kg)   BMI 20.08 kg/m   Constitutional:  Well nourished. Alert and oriented, No acute distress. HEENT: [place] AT, mask in  place.  Trachea midline Cardiovascular: No clubbing, cyanosis, or edema. Respiratory: Normal respiratory effort, no increased work of breathing. Neurologic: Grossly intact, no focal deficits, moving all 4 extremities. Psychiatric: Normal mood and affect.   Laboratory Data: Lab Results  Component Value Date   WBC 3.5 (L) 05/12/2020   HGB 12.1 05/12/2020   HCT 36.2 05/12/2020   MCV 87.7 05/12/2020   PLT 153.0 05/12/2020    Lab Results  Component Value Date   CREATININE 0.89 05/12/2020    Lab Results  Component Value Date   HGBA1C 5.5 01/15/2018    Lab Results  Component Value Date   TSH 3.11 05/12/2020       Component Value Date/Time   CHOL 187 05/12/2020 0853   HDL 55.40 05/12/2020 0853   CHOLHDL 3 05/12/2020 0853   VLDL 17.8 05/12/2020 0853   LDLCALC 114 (H) 05/12/2020 0853    Lab Results  Component Value Date   AST 33 05/12/2020   Lab Results  Component Value Date   ALT 16  05/12/2020    Urinalysis Component     Latest Ref Rng & Units 05/23/2020  Color, Urine     YELLOW YELLOW  Appearance     CLEAR CLEAR  Specific Gravity, Urine     1.005 - 1.030 1.015  pH     5.0 - 8.0 6.0  Glucose, UA     NEGATIVE mg/dL NEGATIVE  Hgb urine dipstick     NEGATIVE NEGATIVE  Bilirubin Urine     NEGATIVE NEGATIVE  Ketones, ur     NEGATIVE mg/dL NEGATIVE  Protein     NEGATIVE mg/dL NEGATIVE  Nitrite     NEGATIVE NEGATIVE  Leukocytes,Ua     NEGATIVE NEGATIVE  Squamous Epithelial / LPF     0 - 5 0-5  WBC, UA     0 - 5 WBC/hpf 0-5  RBC / HPF     0 - 5 RBC/hpf 0-5  Bacteria, UA     NONE SEEN RARE (A)  Mucus      PRESENT   I have reviewed the labs.   Pertinent Imaging: CLINICAL DATA:  Nephrolithiasis.  EXAM: ABDOMEN - 1 VIEW  COMPARISON:  10/20/2019  FINDINGS: Multiple right renal calculi are again seen with overall slightly increased stone burden compared to the prior study. The largest stone measures 6 mm in the lower pole. No definite left renal or ureteral calculi are identified. No dilated loops of bowel are seen to indicate obstruction. A staple line is noted in the right hemipelvis. The visualized lung bases are clear. No acute osseous abnormality is seen.  IMPRESSION: Slightly increased right renal stone burden.   Electronically Signed   By: Logan Bores M.D.   On: 05/24/2020 15:14 I have independently reviewed the films and note the right renal calculus.   Assessment & Plan:    1. Right nephrolithiasis KUB stable right renal calculus with a small right upper pole stone Asymptomatic at this visit Will plan for follow up in 6 months for KUB per patient's request  Patient is advised that if they should start to experience pain that is not able to be controlled with pain medication, intractable nausea and/or vomiting  and/or fevers greater than 103 or shaking chills to contact the office immediately or seek treatment in the  emergency department for emergent intervention.    2. History of hematuria Hematuria work up completed in 02/2018 - findings positive for bilateral nephrolithiasis No report of gross hematuria  UA today negative for micro heme RTC in 6 months for repeat UA  Per patient's request   Return in about 6 months (around 11/20/2020) for UA and KUB .  These notes generated with voice recognition software. I apologize for typographical errors.  Zara Council, PA-C  [facility] [address]  [place], [place] [address] [phone]

## 2020-05-23 ENCOUNTER — Ambulatory Visit
Admission: RE | Admit: 2020-05-23 | Discharge: 2020-05-23 | Disposition: A | Discharge status: Home / Self Care | Payer: Medicare Other | Source: Ambulatory Visit | Attending: Urology | Admitting: Urology

## 2020-05-23 ENCOUNTER — Other Ambulatory Visit: Payer: Self-pay | Admitting: *Deleted

## 2020-05-23 ENCOUNTER — Encounter: Payer: Self-pay | Admitting: Urology

## 2020-05-23 ENCOUNTER — Other Ambulatory Visit: Payer: Self-pay

## 2020-05-23 ENCOUNTER — Ambulatory Visit
Admission: RE | Admit: 2020-05-23 | Discharge: 2020-05-23 | Disposition: A | Discharge status: Home / Self Care | Payer: Medicare Other | Attending: Urology | Admitting: Urology

## 2020-05-23 ENCOUNTER — Other Ambulatory Visit
Admission: RE | Admit: 2020-05-23 | Discharge: 2020-05-23 | Disposition: A | Discharge status: Home / Self Care | Payer: Medicare Other | Source: Home / Self Care | Attending: Urology | Admitting: Urology

## 2020-05-23 ENCOUNTER — Ambulatory Visit: Payer: Medicare Other | Admitting: Urology

## 2020-05-23 VITALS — BP 121/52 | HR 66 | Ht 64.0 in | Wt 117.0 lb

## 2020-05-23 DIAGNOSIS — N200 Calculus of kidney: Secondary | ICD-10-CM

## 2020-05-23 DIAGNOSIS — R350 Frequency of micturition: Secondary | ICD-10-CM

## 2020-05-23 DIAGNOSIS — R3129 Other microscopic hematuria: Secondary | ICD-10-CM

## 2020-05-23 LAB — URINALYSIS, COMPLETE (UACMP) WITH MICROSCOPIC
Bilirubin Urine: NEGATIVE
Glucose, UA: NEGATIVE mg/dL
Hgb urine dipstick: NEGATIVE
Ketones, ur: NEGATIVE mg/dL
Leukocytes,Ua: NEGATIVE
Nitrite: NEGATIVE
Protein, ur: NEGATIVE mg/dL
Specific Gravity, Urine: 1.015 (ref 1.005–1.030)
pH: 6 (ref 5.0–8.0)

## 2020-05-25 ENCOUNTER — Ambulatory Visit: Payer: Self-pay | Admitting: Urology

## 2020-05-31 ENCOUNTER — Ambulatory Visit
Admission: RE | Admit: 2020-05-31 | Discharge: 2020-05-31 | Disposition: A | Discharge status: Home / Self Care | Payer: Medicare Other | Source: Ambulatory Visit | Attending: Internal Medicine | Admitting: Internal Medicine

## 2020-05-31 DIAGNOSIS — Z1231 Encounter for screening mammogram for malignant neoplasm of breast: Secondary | ICD-10-CM | POA: Insufficient documentation

## 2020-06-07 ENCOUNTER — Other Ambulatory Visit: Payer: Self-pay

## 2020-06-07 ENCOUNTER — Telehealth (INDEPENDENT_AMBULATORY_CARE_PROVIDER_SITE_OTHER): Payer: Medicare Other | Admitting: Internal Medicine

## 2020-06-07 ENCOUNTER — Encounter: Payer: Self-pay | Admitting: Internal Medicine

## 2020-06-07 DIAGNOSIS — G43709 Chronic migraine without aura, not intractable, without status migrainosus: Secondary | ICD-10-CM | POA: Diagnosis not present

## 2020-06-07 DIAGNOSIS — E7800 Pure hypercholesterolemia, unspecified: Secondary | ICD-10-CM | POA: Diagnosis not present

## 2020-06-07 DIAGNOSIS — R197 Diarrhea, unspecified: Secondary | ICD-10-CM

## 2020-06-07 DIAGNOSIS — D72819 Decreased white blood cell count, unspecified: Secondary | ICD-10-CM

## 2020-06-07 DIAGNOSIS — F32A Depression, unspecified: Secondary | ICD-10-CM

## 2020-06-07 DIAGNOSIS — K219 Gastro-esophageal reflux disease without esophagitis: Secondary | ICD-10-CM | POA: Diagnosis not present

## 2020-06-07 DIAGNOSIS — R110 Nausea: Secondary | ICD-10-CM | POA: Diagnosis not present

## 2020-06-07 DIAGNOSIS — F320 Major depressive disorder, single episode, mild: Secondary | ICD-10-CM

## 2020-06-07 MED ORDER — ONDANSETRON 4 MG PO TBDP: 4.0000 mg | ORAL_TABLET | Freq: Two times a day (BID) | ORAL | 0 refills | Status: AC | PRN

## 2020-06-07 MED ORDER — ROSUVASTATIN CALCIUM 5 MG PO TABS: ORAL_TABLET | ORAL | 1 refills | Status: DC

## 2020-06-07 MED ORDER — SUCRALFATE 1 G PO TABS: 1.0000 g | ORAL_TABLET | Freq: Two times a day (BID) | ORAL | 0 refills | Status: DC | PRN

## 2020-06-07 NOTE — Progress Notes (Signed)
Patient ID: Brandi Ramos, female   DOB: 09/02/1946, 74 y.o.   MRN: [id number]   Virtual Visit via video Note  This visit type was conducted due to national recommendations for restrictions regarding the COVID-19 pandemic (e.g. social distancing).  This format is felt to be most appropriate for this patient at this time.  All issues noted in this document were discussed and addressed.  No physical exam was performed (except for noted visual exam findings with Video Visits).   I connected with Yolanda Bonine by a video enabled telemedicine application and verified that I am speaking with the correct person using two identifiers. Location patient: home Location provider: work Persons participating in the virtual visit: patient, provider  The limitations, risks, security and privacy concerns of performing an evaluation and management service by video and the availability of in person appointments have been discussed.  It has also been discussed with the patient that there may be a patient responsible charge related to this service. The patient expressed understanding and agreed to proceed.   Reason for visit: work in appt  HPI: Work in appt to discuss her labs.  She is also having increased diarrhea.  States she ate onions Sunday PM.  Had 3-4 episodes of diarrhea Sunday night.  Very minimal nausea.  No vomiting.  No diarrhea yesterday.  Just did not feel well.  Ate soup and cheese toast yesterday.  States this am, stomach rumbling again.  No significant abdominal pain.  Request refill carafate and request to have something for nausea.  No blood in stool.  No cough or congestion.  No fever.  Has not been around any one who has been sick.  No known covid exposure.  Discussed labs.  Discussed cholesterol and calculated cholesterol risk.  Discussed starting a cholesterol medication.  She is agreeable.     ROS: See pertinent positives and negatives per HPI.  Past Medical History:  Diagnosis Date  .  Anxiety   . Arthritis   . Chicken pox   . Depression   . Diverticulosis   . Fibrocystic breast disease   . Frozen shoulder   . Fundic gland polyps of stomach, benign   . GERD (gastroesophageal reflux disease)   . History of diverticulitis of colon   . Kidney stones   . Migraine headache   . Neutropenia ([place])    worked up - Dr Oliva Bustard, slightly elevated ANA  . PUD (peptic ulcer disease)   . Recurrent vaginitis   . Seasonal allergies   . 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     Past Surgical History:  Procedure Laterality Date  . ABDOMINAL HYSTERECTOMY  1984  . COLONOSCOPY    . COLONOSCOPY WITH PROPOFOL N/A 09/26/2018   Procedure: COLONOSCOPY WITH PROPOFOL;  Surgeon: Lollie Sails, MD;  Location: [facility] ENDOSCOPY;  Service: Endoscopy;  Laterality: N/A;  . ESOPHAGOGASTRODUODENOSCOPY (EGD) WITH PROPOFOL N/A 10/31/2015   Procedure: ESOPHAGOGASTRODUODENOSCOPY (EGD) WITH PROPOFOL;  Surgeon: Lollie Sails, MD;  Location[facility] ENDOSCOPY;  Service: Endoscopy;  Laterality: N/A;  . ESOPHAGOGASTRODUODENOSCOPY (EGD) WITH PROPOFOL N/A 09/26/2018   Procedure: ESOPHAGOGASTRODUODENOSCOPY (EGD) WITH PROPOFOL;  Surgeon: Lollie Sails, MD;  Location: [facility] ENDOSCOPY;  Service: Endoscopy;  Laterality: N/A;  . LAPAROSCOPIC APPENDECTOMY N/A 10/29/2018   Procedure: APPENDECTOMY LAPAROSCOPIC;  Surgeon: Herbert Pun, MD;  Location: [facility] ORS;  Service: General;  Laterality: N/A;  . LAPAROSCOPIC BILATERAL SALPINGO OOPHERECTOMY Bilateral 10/29/2018   Procedure: LAPAROSCOPIC BILATERAL  SALPINGO OOPHORECTOMY;  Surgeon: Ward, Honor Loh, MD;  Location: [facility] ORS;  Service: Gynecology;  Laterality: Bilateral;    Family History  Problem Relation Age of Onset  . CAD Mother        s/p CABG  . Hypercholesterolemia Mother   . Breast cancer Mother 3  . Heart disease Mother   . Lung cancer Mother   . Hypertension Father   . Migraines Father   . Hypercholesterolemia Father    . Lung cancer Father   . Migraines Sister   . Arthritis Sister   . Lymphoma Other        grandmother  . Colon cancer Neg Hx     SOCIAL HX: reviewed.    Current Outpatient Medications:  .  traZODone (DESYREL) 50 MG tablet, Take 50 mg by mouth at bedtime., Disp: , Rfl:  .  AIMOVIG 140 DOSE 70 MG/ML SOAJ, INJ 1 SYRINGE UTD Q 4 WKS, Disp: , Rfl: 11 .  ALPRAZolam (XANAX) 0.25 MG tablet, TAKE 1 TABLET BY MOUTH EVERY DAY AS NEEDED, Disp: 30 tablet, Rfl: 1 .  busPIRone (BUSPAR) 10 MG tablet, Take 1 tablet (10 mg total) by mouth daily., Disp: 30 tablet, Rfl: 2 .  DULoxetine (CYMBALTA) 60 MG capsule, Take 1 capsule (60 mg total) by mouth daily., Disp: 90 capsule, Rfl: 1 .  fluticasone (FLONASE) 50 MCG/ACT nasal spray, SHAKE LIQUID AND USE 2 SPRAYS IN EACH NOSTRIL DAILY, Disp: 16 g, Rfl: 11 .  loratadine (CLARITIN) 10 MG tablet, Take 10 mg by mouth daily., Disp: , Rfl:  .  ondansetron (ZOFRAN ODT) 4 MG disintegrating tablet, Take 1 tablet (4 mg total) by mouth 2 (two) times daily as needed for nausea or vomiting., Disp: 14 tablet, Rfl: 0 .  propranolol ER (INDERAL LA) 60 MG 24 hr capsule, TK 1 C PO D, Disp: , Rfl:  .  RABEprazole (ACIPHEX) 20 MG tablet, Take 1 tablet (20 mg total) by mouth 2 (two) times daily before a meal., Disp: 180 tablet, Rfl: 1 .  rizatriptan (MAXALT) 10 MG tablet, Take 10 mg by mouth as needed. May repeat in 2 hours if needed, Disp: , Rfl:  .  rosuvastatin (CRESTOR) 5 MG tablet, Take one tablet q Monday, Wednesday and Friday., Disp: 36 tablet, Rfl: 1 .  sucralfate (CARAFATE) 1 g tablet, Take 1 tablet (1 g total) by mouth 2 (two) times daily as needed., Disp: 60 tablet, Rfl: 0 .  SUMAtriptan (IMITREX) 100 MG tablet, , Disp: , Rfl:  .  topiramate (TOPAMAX) 50 MG tablet, TAKE 1 AND 1/2 TABLET BY MOUTH IN MORNING AND 1 TABLET BY MOUTH IN EVENING, Disp: , Rfl:   EXAM:  GENERAL: alert, oriented, appears well and in no acute distress  HEENT: atraumatic, conjunttiva clear, no  obvious abnormalities on inspection of external nose and ears  NECK: normal movements of the head and neck  LUNGS: on inspection no signs of respiratory distress, breathing rate appears normal, no obvious gross SOB, gasping or wheezing  CV: no obvious cyanosis  PSYCH/NEURO: pleasant and cooperative, no obvious depression or anxiety, speech and thought processing grossly intact  ASSESSMENT AND PLAN:  Discussed the following assessment and plan:  Hypercholesterolemia The 10-year ASCVD risk score Mikey Bussing DC Jr., et al., 2013) is: 9.3%   Values used to calculate the score:     Age: 38 years     Sex: Female     Is Non-Hispanic African American: No     Diabetic: No  Tobacco smoker: No     Systolic Blood Pressure: 967 mmHg     Is BP treated: No     HDL Cholesterol: 55.4 mg/dL     Total Cholesterol: 187 mg/dL   Discussed calculated cholesterol risk. Discussed recommendation to start a cholesterol medication.  She is in agreement.  Start low dose crestor.  Check liver panel 6 weeks after starting.    Migraine headache Has been followed by neurology.  On inderal and topamax.  Stable.   GERD (gastroesophageal reflux disease) On aciphex. Some nausea recent - as outlined.  Request refill carafate.  No acid reflux.  Follow.    Nausea Nausea as outlined.  Just started.  May be related to the onions or viral gastroenteritis.  zofran prn.  Follow closely. Bland foods.  Advance diet slowly.  Follow.    Mild depression ([facility]) Overall appears to be doing better. Continues on cymbalta, trazodone and buspar.  Follow.   Leukopenia Has been stable. Follow cbc.   Diarrhea Just started - after eating onions.  Possibly related to this.  Possible viral gastroenteritis.  Bland foods.  Advance slowly.  zofrtan to help with nausea.  She request refill carafate.  Follow.  Call with update.     Orders Placed This Encounter  Procedures  . Hepatic function panel    Standing Status:   Future     Standing Expiration Date:   06/19/2021  . CBC with Differential/Platelet    Standing Status:   Future    Standing Expiration Date:   06/19/2021    Meds ordered this encounter  Medications  . sucralfate (CARAFATE) 1 g tablet    Sig: Take 1 tablet (1 g total) by mouth 2 (two) times daily as needed.    Dispense:  60 tablet    Refill:  0  . ondansetron (ZOFRAN ODT) 4 MG disintegrating tablet    Sig: Take 1 tablet (4 mg total) by mouth 2 (two) times daily as needed for nausea or vomiting.    Dispense:  14 tablet    Refill:  0  . rosuvastatin (CRESTOR) 5 MG tablet    Sig: Take one tablet q Monday, Wednesday and Friday.    Dispense:  36 tablet    Refill:  1     I discussed the assessment and treatment plan with the patient. The patient was provided an opportunity to ask questions and all were answered. The patient agreed with the plan and demonstrated an understanding of the instructions.   The patient was advised to call back or seek an in-person evaluation if the symptoms worsen or if the condition fails to improve as anticipated.   Einar Pheasant, MD

## 2020-06-07 NOTE — Assessment & Plan Note (Addendum)
The 10-year ASCVD risk score Brandi Ramos., et al., 2013) is: 9.3%   Values used to calculate the score:     Age: 74 years     Sex: Female     Is Non-Hispanic African American: No     Diabetic: No     Tobacco smoker: No     Systolic Blood Pressure: 491 mmHg     Is BP treated: No     HDL Cholesterol: 55.4 mg/dL     Total Cholesterol: 187 mg/dL   Discussed calculated cholesterol risk. Discussed recommendation to start a cholesterol medication.  She is in agreement.  Start low dose crestor.  Check liver panel 6 weeks after starting.

## 2020-06-19 ENCOUNTER — Encounter: Payer: Self-pay | Admitting: Internal Medicine

## 2020-06-19 DIAGNOSIS — R197 Diarrhea, unspecified: Secondary | ICD-10-CM | POA: Insufficient documentation

## 2020-06-19 NOTE — Assessment & Plan Note (Signed)
Has been stable. Follow cbc.

## 2020-06-19 NOTE — Assessment & Plan Note (Signed)
Nausea as outlined.  Just started.  May be related to the onions or viral gastroenteritis.  zofran prn.  Follow closely. Bland foods.  Advance diet slowly.  Follow.

## 2020-06-19 NOTE — Assessment & Plan Note (Signed)
On aciphex. Some nausea recent - as outlined.  Request refill carafate.  No acid reflux.  Follow.

## 2020-06-19 NOTE — Assessment & Plan Note (Signed)
Overall appears to be doing better. Continues on cymbalta, trazodone and buspar.  Follow.

## 2020-06-19 NOTE — Assessment & Plan Note (Signed)
Has been followed by neurology.  On inderal and topamax.  Stable.

## 2020-06-19 NOTE — Assessment & Plan Note (Signed)
Just started - after eating onions.  Possibly related to this.  Possible viral gastroenteritis.  Bland foods.  Advance slowly.  zofrtan to help with nausea.  She request refill carafate.  Follow.  Call with update.

## 2020-08-09 ENCOUNTER — Ambulatory Visit (INDEPENDENT_AMBULATORY_CARE_PROVIDER_SITE_OTHER): Payer: Medicare Other | Admitting: Internal Medicine

## 2020-08-09 ENCOUNTER — Other Ambulatory Visit: Payer: Self-pay

## 2020-08-09 ENCOUNTER — Telehealth: Payer: Self-pay | Admitting: Internal Medicine

## 2020-08-09 DIAGNOSIS — G43709 Chronic migraine without aura, not intractable, without status migrainosus: Secondary | ICD-10-CM | POA: Diagnosis not present

## 2020-08-09 DIAGNOSIS — R319 Hematuria, unspecified: Secondary | ICD-10-CM

## 2020-08-09 DIAGNOSIS — E7800 Pure hypercholesterolemia, unspecified: Secondary | ICD-10-CM

## 2020-08-09 DIAGNOSIS — F419 Anxiety disorder, unspecified: Secondary | ICD-10-CM

## 2020-08-09 DIAGNOSIS — D72819 Decreased white blood cell count, unspecified: Secondary | ICD-10-CM | POA: Diagnosis not present

## 2020-08-09 DIAGNOSIS — F320 Major depressive disorder, single episode, mild: Secondary | ICD-10-CM

## 2020-08-09 DIAGNOSIS — K219 Gastro-esophageal reflux disease without esophagitis: Secondary | ICD-10-CM

## 2020-08-09 DIAGNOSIS — F32A Depression, unspecified: Secondary | ICD-10-CM

## 2020-08-09 DIAGNOSIS — Z9109 Other allergy status, other than to drugs and biological substances: Secondary | ICD-10-CM

## 2020-08-09 LAB — HEPATIC FUNCTION PANEL
ALT: 11 U/L (ref 0–35)
AST: 20 U/L (ref 0–37)
Albumin: 3.9 g/dL (ref 3.5–5.2)
Alkaline Phosphatase: 50 U/L (ref 39–117)
Bilirubin, Direct: 0.1 mg/dL (ref 0.0–0.3)
Total Bilirubin: 0.3 mg/dL (ref 0.2–1.2)
Total Protein: 6.5 g/dL (ref 6.0–8.3)

## 2020-08-09 LAB — CBC WITH DIFFERENTIAL/PLATELET
Basophils Absolute: 0 10*3/uL (ref 0.0–0.1)
Basophils Relative: 0.6 % (ref 0.0–3.0)
Eosinophils Absolute: 0.1 10*3/uL (ref 0.0–0.7)
Eosinophils Relative: 2.6 % (ref 0.0–5.0)
HCT: 36.6 % (ref 36.0–46.0)
Hemoglobin: 12.2 g/dL (ref 12.0–15.0)
Lymphocytes Relative: 33.9 % (ref 12.0–46.0)
Lymphs Abs: 1.5 10*3/uL (ref 0.7–4.0)
MCHC: 33.3 g/dL (ref 30.0–36.0)
MCV: 87.6 fl (ref 78.0–100.0)
Monocytes Absolute: 0.4 10*3/uL (ref 0.1–1.0)
Monocytes Relative: 9.4 % (ref 3.0–12.0)
Neutro Abs: 2.3 10*3/uL (ref 1.4–7.7)
Neutrophils Relative %: 53.5 % (ref 43.0–77.0)
Platelets: 151 10*3/uL (ref 150.0–400.0)
RBC: 4.18 Mil/uL (ref 3.87–5.11)
RDW: 13.8 % (ref 11.5–15.5)
WBC: 4.4 10*3/uL (ref 4.0–10.5)

## 2020-08-09 NOTE — Telephone Encounter (Signed)
Needs fasting lab appt scheduled in 2 months.  Please schedule and notify pt of appt date and time.  Thanks.

## 2020-08-09 NOTE — Progress Notes (Signed)
Patient ID: Brandi Ramos, female   DOB: 03/16/1946, 74 y.o.   MRN: [id number]   Subjective:    Patient ID: Brandi Ramos, female    DOB: June 29, 1946, 74 y.o.   MRN: [id number]  HPI This visit occurred during the SARS-CoV-2 public health emergency.  Safety protocols were in place, including screening questions prior to the visit, additional usage of staff PPE, and extensive cleaning of exam room while observing appropriate contact time as indicated for disinfecting solutions.  Patient here for a scheduled follow up.  Here to follow up regarding increased stress, acid reflux and headaches.  She reports she is doing relatively well.  Tries to stay active.  No chest pain or sob reported.  No abdominal pain reported.  Seeing GI for her bowels.  Saw neurology 06/2020 for f/u headache.  Recommended trial of emgality.  Recommended continuing cymbalta and topamax.  Trazodone for sleep.  Overall appears to be stable.  Handling stress.  Allergies appear to be controlled.     Past Medical History:  Diagnosis Date  . Anxiety   . Arthritis   . Chicken pox   . Depression   . Diverticulosis   . Fibrocystic breast disease   . Frozen shoulder   . Fundic gland polyps of stomach, benign   . GERD (gastroesophageal reflux disease)   . History of diverticulitis of colon   . Kidney stones   . Migraine headache   . Neutropenia ([place])    worked up - Dr Oliva Bustard, slightly elevated ANA  . PUD (peptic ulcer disease)   . Recurrent vaginitis   . Seasonal allergies   . 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    Past Surgical History:  Procedure Laterality Date  . ABDOMINAL HYSTERECTOMY  1984  . COLONOSCOPY    . COLONOSCOPY WITH PROPOFOL N/A 09/26/2018   Procedure: COLONOSCOPY WITH PROPOFOL;  Surgeon: Lollie Sails, MD;  Location: [facility] ENDOSCOPY;  Service: Endoscopy;  Laterality: N/A;  . ESOPHAGOGASTRODUODENOSCOPY (EGD) WITH PROPOFOL N/A 10/31/2015   Procedure:  ESOPHAGOGASTRODUODENOSCOPY (EGD) WITH PROPOFOL;  Surgeon: Lollie Sails, MD;  Location: [facility] ENDOSCOPY;  Service: Endoscopy;  Laterality: N/A;  . ESOPHAGOGASTRODUODENOSCOPY (EGD) WITH PROPOFOL N/A 09/26/2018   Procedure: ESOPHAGOGASTRODUODENOSCOPY (EGD) WITH PROPOFOL;  Surgeon: Lollie Sails, MD;  Location: [facility] ENDOSCOPY;  Service: Endoscopy;  Laterality: N/A;  . LAPAROSCOPIC APPENDECTOMY N/A 10/29/2018   Procedure: APPENDECTOMY LAPAROSCOPIC;  Surgeon: Herbert Pun, MD;  Location: [facility] ORS;  Service: General;  Laterality: N/A;  . LAPAROSCOPIC BILATERAL SALPINGO OOPHERECTOMY Bilateral 10/29/2018   Procedure: LAPAROSCOPIC BILATERAL SALPINGO OOPHORECTOMY;  Surgeon: Ward, Honor Loh, MD;  Location: [facility] ORS;  Service: Gynecology;  Laterality: Bilateral;   Family History  Problem Relation Age of Onset  . CAD Mother        s/p CABG  . Hypercholesterolemia Mother   . Breast cancer Mother 31  . Heart disease Mother   . Lung cancer Mother   . Hypertension Father   . Migraines Father   . Hypercholesterolemia Father   . Lung cancer Father   . Migraines Sister   . Arthritis Sister   . Lymphoma Other        grandmother  . Colon cancer Neg Hx    Social History   Socioeconomic History  . Marital status: Widowed    Spouse name: Not on file  . Number of children: 2  . Years of education: Not on file  . Highest  education level: Not on file  Occupational History  . Not on file  Tobacco Use  . Smoking status: Never Smoker  . Smokeless tobacco: Never Used  Vaping Use  . Vaping Use: Never used  Substance and Sexual Activity  . Alcohol use: Yes    Alcohol/week: 0.0 standard drinks    Comment: wine occassionally  . Drug use: No  . Sexual activity: Not Currently  Other Topics Concern  . Not on file  Social History Narrative  . Not on file   Social Determinants of Health   Financial Resource Strain: Low Risk   . Difficulty of Paying Living Expenses: Not hard at all  Food  Insecurity: No Food Insecurity  . Worried About Charity fundraiser in the Last Year: Never true  . Ran Out of Food in the Last Year: Never true  Transportation Needs: No Transportation Needs  . Lack of Transportation (Medical): No  . Lack of Transportation (Non-Medical): No  Physical Activity: Not on file  Stress: Not on file  Social Connections: Moderately Integrated  . Frequency of Communication with Friends and Family: More than three times a week  . Frequency of Social Gatherings with Friends and Family: More than three times a week  . Attends Religious Services: More than 4 times per year  . Active Member of Clubs or Organizations: Yes  . Attends Archivist Meetings: More than 4 times per year  . Marital Status: Widowed    Outpatient Encounter Medications as of 08/09/2020  Medication Sig  . AIMOVIG 140 DOSE 70 MG/ML SOAJ INJ 1 SYRINGE UTD Q 4 WKS  . ALPRAZolam (XANAX) 0.25 MG tablet TAKE 1 TABLET BY MOUTH EVERY DAY AS NEEDED  . busPIRone (BUSPAR) 10 MG tablet Take 1 tablet (10 mg total) by mouth daily.  . DULoxetine (CYMBALTA) 60 MG capsule Take 1 capsule (60 mg total) by mouth daily.  . fluticasone (FLONASE) 50 MCG/ACT nasal spray SHAKE LIQUID AND USE 2 SPRAYS IN EACH NOSTRIL DAILY  . loratadine (CLARITIN) 10 MG tablet Take 10 mg by mouth daily.  . ondansetron (ZOFRAN ODT) 4 MG disintegrating tablet Take 1 tablet (4 mg total) by mouth 2 (two) times daily as needed for nausea or vomiting.  . propranolol ER (INDERAL LA) 60 MG 24 hr capsule TK 1 C PO D  . RABEprazole (ACIPHEX) 20 MG tablet Take 1 tablet (20 mg total) by mouth 2 (two) times daily before a meal.  . rizatriptan (MAXALT) 10 MG tablet Take 10 mg by mouth as needed. May repeat in 2 hours if needed  . rosuvastatin (CRESTOR) 5 MG tablet Take one tablet q Monday, Wednesday and Friday.  . sucralfate (CARAFATE) 1 g tablet Take 1 tablet (1 g total) by mouth 2 (two) times daily as needed.  . SUMAtriptan (IMITREX) 100  MG tablet   . topiramate (TOPAMAX) 50 MG tablet TAKE 1 AND 1/2 TABLET BY MOUTH IN MORNING AND 1 TABLET BY MOUTH IN EVENING  . traZODone (DESYREL) 50 MG tablet Take 50 mg by mouth at bedtime.  . [DISCONTINUED] LINZESS 145 MCG CAPS capsule Take 145 mcg by mouth daily.   No facility-administered encounter medications on file as of 08/09/2020.    Review of Systems  Constitutional: Negative for appetite change and unexpected weight change.  HENT: Negative for congestion and sinus pressure.   Respiratory: Negative for cough, chest tightness and shortness of breath.   Cardiovascular: Negative for chest pain, palpitations and leg swelling.  Gastrointestinal:  Negative for abdominal pain, diarrhea, nausea and vomiting.  Genitourinary: Negative for difficulty urinating and dysuria.  Musculoskeletal: Negative for joint swelling and myalgias.  Skin: Negative for color change and rash.  Neurological: Negative for dizziness and light-headedness.       Headaches stable.   Psychiatric/Behavioral: Negative for agitation and dysphoric mood.       Overall appears to be handling stress relatively well.        Objective:    Physical Exam Vitals reviewed.  Constitutional:      General: She is not in acute distress.    Appearance: Normal appearance.  HENT:     Head: Normocephalic and atraumatic.     Right Ear: External ear normal.     Left Ear: External ear normal.     Mouth/Throat:     Mouth: Oropharynx is clear and moist.  Eyes:     General: No scleral icterus.       Right eye: No discharge.        Left eye: No discharge.     Conjunctiva/sclera: Conjunctivae normal.  Neck:     Thyroid: No thyromegaly.  Cardiovascular:     Rate and Rhythm: Normal rate and regular rhythm.  Pulmonary:     Effort: No respiratory distress.     Breath sounds: Normal breath sounds. No wheezing.  Abdominal:     General: Bowel sounds are normal.     Palpations: Abdomen is soft.     Tenderness: There is no  abdominal tenderness.  Musculoskeletal:        General: No swelling, tenderness or edema.     Cervical back: Neck supple. No tenderness.  Lymphadenopathy:     Cervical: No cervical adenopathy.  Skin:    Findings: No erythema or rash.  Neurological:     Mental Status: She is alert.  Psychiatric:        Mood and Affect: Mood normal.        Behavior: Behavior normal.     BP 122/70   Pulse 78   Temp 98.1 F (36.7 C) (Oral)   Resp 16   Ht 5\' 4"  (1.626 m)   Wt 119 lb (54 kg)   SpO2 99%   BMI 20.43 kg/m  Wt Readings from Last 3 Encounters:  08/09/20 119 lb (54 kg)  06/07/20 118 lb (53.5 kg)  05/23/20 117 lb (53.1 kg)     Lab Results  Component Value Date   WBC 4.4 08/09/2020   HGB 12.2 08/09/2020   HCT 36.6 08/09/2020   PLT 151.0 08/09/2020   GLUCOSE 93 05/12/2020   CHOL 187 05/12/2020   TRIG 89.0 05/12/2020   HDL 55.40 05/12/2020   LDLCALC 114 (H) 05/12/2020   ALT 11 08/09/2020   AST 20 08/09/2020   NA 142 05/12/2020   K 3.9 05/12/2020   CL 106 05/12/2020   CREATININE 0.89 05/12/2020   BUN 15 05/12/2020   CO2 31 05/12/2020   TSH 3.11 05/12/2020   HGBA1C 5.5 01/15/2018    MM 3D SCREEN BREAST BILATERAL  Result Date: 05/31/2020 CLINICAL DATA:  Screening. EXAM: DIGITAL SCREENING BILATERAL MAMMOGRAM WITH TOMO AND CAD COMPARISON:  Previous exam(s). ACR Breast Density Category c: The breast tissue is heterogeneously dense, which may obscure small masses. FINDINGS: There are no findings suspicious for malignancy. Images were processed with CAD. IMPRESSION: No mammographic evidence of malignancy. A result letter of this screening mammogram will be mailed directly to the patient. RECOMMENDATION: Screening mammogram in one year. (  Code:SM-B-01Y) BI-RADS CATEGORY  1: Negative. Electronically Signed   By: Margarette Canada M.D.   On: 05/31/2020 14:21       Assessment & Plan:   Problem List Items Addressed This Visit    Mild depression ([place])    Continues on cymbalta, buspar and  trazodone.  Overall appears to be stable. Follow.       Migraine headache    Followed by neurology.  Trial of emgality.  Continue topamax.  Follow.       Leukopenia    White blood cell count has been stable. Follow cbc.       Hypercholesterolemia    Continue crestor.  Low cholesterol diet and exercise. Follow lipid panel and liver function tests.        Hematuria    Last evaluated by urology 05/2020.  Right renal calculus.  Urine negative.  Recommended f/u in 6 months (with KUB).       GERD (gastroesophageal reflux disease)    No upper symptoms reported. On aciphex.  Has carafate. Follow.       Environmental allergies    Appear to be controlled on current regimen.       Anxiety    Continue cymbalta.  Trazodone q hs.  Follow.            Einar Pheasant, MD

## 2020-08-10 NOTE — Telephone Encounter (Signed)
Pt scheduled  

## 2020-08-21 ENCOUNTER — Encounter: Payer: Self-pay | Admitting: Internal Medicine

## 2020-08-21 NOTE — Assessment & Plan Note (Signed)
Continues on cymbalta, buspar and trazodone.  Overall appears to be stable. Follow.

## 2020-08-21 NOTE — Assessment & Plan Note (Signed)
White blood cell count has been stable. Follow cbc.

## 2020-08-21 NOTE — Assessment & Plan Note (Signed)
Last evaluated by urology 05/2020.  Right renal calculus.  Urine negative.  Recommended f/u in 6 months (with KUB).

## 2020-08-21 NOTE — Assessment & Plan Note (Signed)
Followed by neurology.  Trial of emgality.  Continue topamax.  Follow.

## 2020-08-21 NOTE — Assessment & Plan Note (Signed)
Appear to be controlled on current regimen.

## 2020-08-21 NOTE — Assessment & Plan Note (Signed)
No upper symptoms reported. On aciphex.  Has carafate. Follow.

## 2020-08-21 NOTE — Assessment & Plan Note (Signed)
Continue crestor.  Low cholesterol diet and exercise. Follow lipid panel and liver function tests.   

## 2020-08-21 NOTE — Assessment & Plan Note (Signed)
Continue cymbalta.  Trazodone q hs.  Follow.

## 2020-09-13 DIAGNOSIS — G5762 Lesion of plantar nerve, left lower limb: Secondary | ICD-10-CM | POA: Diagnosis not present

## 2020-09-27 DIAGNOSIS — G4723 Circadian rhythm sleep disorder, irregular sleep wake type: Secondary | ICD-10-CM | POA: Diagnosis not present

## 2020-09-27 DIAGNOSIS — J3089 Other allergic rhinitis: Secondary | ICD-10-CM | POA: Diagnosis not present

## 2020-09-27 DIAGNOSIS — G4701 Insomnia due to medical condition: Secondary | ICD-10-CM | POA: Diagnosis not present

## 2020-09-27 DIAGNOSIS — G43719 Chronic migraine without aura, intractable, without status migrainosus: Secondary | ICD-10-CM | POA: Diagnosis not present

## 2020-10-03 DIAGNOSIS — Z20822 Contact with and (suspected) exposure to covid-19: Secondary | ICD-10-CM | POA: Diagnosis not present

## 2020-10-11 ENCOUNTER — Other Ambulatory Visit: Payer: Self-pay

## 2020-10-11 ENCOUNTER — Other Ambulatory Visit (INDEPENDENT_AMBULATORY_CARE_PROVIDER_SITE_OTHER): Payer: Medicare Other

## 2020-10-11 DIAGNOSIS — E7800 Pure hypercholesterolemia, unspecified: Secondary | ICD-10-CM

## 2020-10-11 LAB — BASIC METABOLIC PANEL
BUN: 20 mg/dL (ref 6–23)
CO2: 30 mEq/L (ref 19–32)
Calcium: 9.3 mg/dL (ref 8.4–10.5)
Chloride: 106 mEq/L (ref 96–112)
Creatinine, Ser: 0.94 mg/dL (ref 0.40–1.20)
GFR: 60.72 mL/min (ref >60.00)
Glucose, Bld: 96 mg/dL (ref 70–99)
Potassium: 3.9 mEq/L (ref 3.5–5.1)
Sodium: 140 mEq/L (ref 135–145)

## 2020-10-11 LAB — HEPATIC FUNCTION PANEL
ALT: 12 U/L (ref 0–35)
AST: 22 U/L (ref 0–37)
Albumin: 4.1 g/dL (ref 3.5–5.2)
Alkaline Phosphatase: 54 U/L (ref 39–117)
Bilirubin, Direct: 0.1 mg/dL (ref 0.0–0.3)
Total Bilirubin: 0.5 mg/dL (ref 0.2–1.2)
Total Protein: 6.8 g/dL (ref 6.0–8.3)

## 2020-10-11 LAB — LIPID PANEL
Cholesterol: 141 mg/dL (ref 0–200)
HDL: 59.1 mg/dL (ref >39.00)
LDL Cholesterol: 68 mg/dL (ref 0–99)
NonHDL: 81.92
Total CHOL/HDL Ratio: 2
Triglycerides: 68 mg/dL (ref 0.0–149.0)
VLDL: 13.6 mg/dL (ref 0.0–40.0)

## 2020-10-12 ENCOUNTER — Other Ambulatory Visit: Payer: Medicare Other

## 2020-10-13 DIAGNOSIS — R1032 Left lower quadrant pain: Secondary | ICD-10-CM | POA: Diagnosis not present

## 2020-10-13 DIAGNOSIS — R1031 Right lower quadrant pain: Secondary | ICD-10-CM | POA: Diagnosis not present

## 2020-10-13 DIAGNOSIS — K5792 Diverticulitis of intestine, part unspecified, without perforation or abscess without bleeding: Secondary | ICD-10-CM | POA: Diagnosis not present

## 2020-10-13 DIAGNOSIS — D61818 Other pancytopenia: Secondary | ICD-10-CM | POA: Diagnosis not present

## 2020-10-17 ENCOUNTER — Other Ambulatory Visit (HOSPITAL_COMMUNITY): Payer: Self-pay | Admitting: Gastroenterology

## 2020-10-17 ENCOUNTER — Other Ambulatory Visit: Payer: Self-pay | Admitting: Gastroenterology

## 2020-10-17 DIAGNOSIS — R1031 Right lower quadrant pain: Secondary | ICD-10-CM

## 2020-10-17 DIAGNOSIS — R1032 Left lower quadrant pain: Secondary | ICD-10-CM

## 2020-10-17 DIAGNOSIS — D61818 Other pancytopenia: Secondary | ICD-10-CM

## 2020-10-26 DIAGNOSIS — F3341 Major depressive disorder, recurrent, in partial remission: Secondary | ICD-10-CM | POA: Diagnosis not present

## 2020-10-26 DIAGNOSIS — F410 Panic disorder [episodic paroxysmal anxiety] without agoraphobia: Secondary | ICD-10-CM | POA: Diagnosis not present

## 2020-10-26 DIAGNOSIS — G4700 Insomnia, unspecified: Secondary | ICD-10-CM | POA: Diagnosis not present

## 2020-10-26 DIAGNOSIS — F411 Generalized anxiety disorder: Secondary | ICD-10-CM | POA: Diagnosis not present

## 2020-10-31 ENCOUNTER — Ambulatory Visit
Admission: RE | Admit: 2020-10-31 | Discharge: 2020-10-31 | Disposition: A | Discharge status: Home / Self Care | Payer: Medicare Other | Source: Ambulatory Visit | Attending: Gastroenterology | Admitting: Gastroenterology

## 2020-10-31 ENCOUNTER — Other Ambulatory Visit: Payer: Self-pay

## 2020-10-31 DIAGNOSIS — N200 Calculus of kidney: Secondary | ICD-10-CM | POA: Diagnosis not present

## 2020-10-31 DIAGNOSIS — M5386 Other specified dorsopathies, lumbar region: Secondary | ICD-10-CM | POA: Diagnosis not present

## 2020-10-31 DIAGNOSIS — D61818 Other pancytopenia: Secondary | ICD-10-CM | POA: Insufficient documentation

## 2020-10-31 DIAGNOSIS — R1032 Left lower quadrant pain: Secondary | ICD-10-CM | POA: Insufficient documentation

## 2020-10-31 DIAGNOSIS — N281 Cyst of kidney, acquired: Secondary | ICD-10-CM | POA: Diagnosis not present

## 2020-10-31 DIAGNOSIS — R1031 Right lower quadrant pain: Secondary | ICD-10-CM | POA: Diagnosis not present

## 2020-10-31 DIAGNOSIS — K5900 Constipation, unspecified: Secondary | ICD-10-CM | POA: Diagnosis not present

## 2020-10-31 MED ORDER — IOHEXOL 300 MG/ML  SOLN
85.0000 mL | Freq: Once | INTRAMUSCULAR | Status: AC | PRN
  Administered 2020-10-31: 85 mL via INTRAVENOUS

## 2020-11-01 DIAGNOSIS — G5762 Lesion of plantar nerve, left lower limb: Secondary | ICD-10-CM | POA: Diagnosis not present

## 2020-11-15 ENCOUNTER — Ambulatory Visit: Payer: Medicare Other | Admitting: Internal Medicine

## 2020-11-18 ENCOUNTER — Other Ambulatory Visit: Payer: Self-pay | Admitting: *Deleted

## 2020-11-18 DIAGNOSIS — N200 Calculus of kidney: Secondary | ICD-10-CM

## 2020-11-18 DIAGNOSIS — R350 Frequency of micturition: Secondary | ICD-10-CM

## 2020-11-20 NOTE — Progress Notes (Signed)
11/21/2020 4:21 PM   Brandi Ramos [birth year] [id number]  Referring provider: Einar Pheasant, [address] [place],   [id number] Chief Complaint  Patient presents with   Follow-up    18mth follow-up   Urological history: 1. High risk hematuria - Non-smoker - CTU 01/2018 Nonobstructive right nephrolithiasis includes a 3 mm upper pole calculus on image 73/5; an 8 mm in long axis lower pole calculus on image 63/5; and a 4 mm lower pole calculus on image 66/5.  Left-sided renal calculi include a 1-2 mm upper pole nonobstructive calculus and a 1-2 mm lower pole nonobstructive calculus. There is also faint calcification along the left renal capsule on image 60/5 medially.  Some cortical hypodensity in thinning in the left mid upper kidney adjacent to the spleen. Given the slightly scalloped contour I favor that this is probably from remote prior scarring, and a similar appearance was present on 11/16/2015 - Cysto 02/2018 with Dr. Erlene Quan was negative -Contrast CT in 10/2018 Nonobstructing calculus in the lower pole of the right kidney measuring up to 7 mm on image 47/4. There are probable additional tiny renal calculi - Cystoscopy with Dr. Bernardo Heater in 11/2019 was NED - UA negative for micro heme  2. Nephrolithiasis - Contrast CT in 10/2018 noted there is a nonobstructing calculus in the lower pole of the right kidney measuring up to 7 mm on image 47/4. There are probable additional tiny renal calculi - Contrast CT in 10/2020 7 mm stone noted within the inferior pole of the right kidney. - KUB 11/21/2020 stable right renal calculi 8 mm   HPI: Brandi Ramos is a 75 y.o. female who presents today for a follow up.   Microscopic UA on 10/13/2020 was normal and associated urine culture showed  E. Coli.   Patient reports no episodes of gross hematuria. She reports slow urination stream. No dysuria. Denies fevers, chills or suprapubic pain. Patient reports some  incomplete bladder emptying, urgency and LBP.  PVR is 18 mL.   After she emptied her bladder for the PVR, she still felt like she had to go void.    UA today 0-5 RBC, 6-10 WBC and few bacteria. Patient feels like she has a bladder infection.   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     Surgical History: Past Surgical History:  Procedure Laterality Date   ABDOMINAL HYSTERECTOMY  1984   COLONOSCOPY     COLONOSCOPY WITH PROPOFOL N/A 09/26/2018   Procedure: COLONOSCOPY WITH PROPOFOL;  Surgeon: Lollie Sails, MD;  Location: [facility] ENDOSCOPY;  Service: Endoscopy;  Laterality: N/A;   ESOPHAGOGASTRODUODENOSCOPY (EGD) WITH PROPOFOL N/A 10/31/2015   Procedure: ESOPHAGOGASTRODUODENOSCOPY (EGD) WITH PROPOFOL;  Surgeon: Lollie Sails, MD;  Location: [facility] ENDOSCOPY;  Service: Endoscopy;  Laterality: N/A;   ESOPHAGOGASTRODUODENOSCOPY (EGD) WITH PROPOFOL N/A  09/26/2018   Procedure: ESOPHAGOGASTRODUODENOSCOPY (EGD) WITH PROPOFOL;  Surgeon: Lollie Sails, MD;  Location: [facility] ENDOSCOPY;  Service: Endoscopy;  Laterality: N/A;   LAPAROSCOPIC APPENDECTOMY N/A 10/29/2018   Procedure: APPENDECTOMY LAPAROSCOPIC;  Surgeon: Herbert Pun, MD;  Location: [facility] ORS;  Service: General;  Laterality: N/A;   LAPAROSCOPIC BILATERAL SALPINGO OOPHERECTOMY Bilateral 10/29/2018   Procedure: LAPAROSCOPIC BILATERAL SALPINGO OOPHORECTOMY;  Surgeon: Ward, Honor Loh, MD;  Location: [facility] ORS;   Service: Gynecology;  Laterality: Bilateral;    Home Medications:  Allergies as of 11/21/2020      Reactions   Amitiza [lubiprostone] Other (See Comments)   Sulfa Antibiotics       Medication List       Accurate as of November 21, 2020  4:21 PM. If you have any questions, ask your nurse or doctor.        STOP taking these medications   Aimovig (140 MG Dose) 70 MG/ML Soaj Generic drug: Erenumab-aooe Stopped by: Einar Pheasant, MD     TAKE these medications   ALPRAZolam 0.25 MG tablet Commonly known as: XANAX TAKE 1 TABLET BY MOUTH EVERY DAY AS NEEDED   busPIRone 10 MG tablet Commonly known as: BUSPAR Take 1 tablet (10 mg total) by mouth daily.   DULoxetine 60 MG capsule Commonly known as: CYMBALTA Take 1 capsule (60 mg total) by mouth daily.   fluticasone 50 MCG/ACT nasal spray Commonly known as: FLONASE SHAKE LIQUID AND USE 2 SPRAYS IN EACH NOSTRIL DAILY   Linzess 145 MCG Caps capsule Generic drug: linaclotide Take 145 mcg by mouth daily.   loratadine 10 MG tablet Commonly known as: CLARITIN Take 10 mg by mouth daily.   nitrofurantoin (macrocrystal-monohydrate) 100 MG capsule Commonly known as: MACROBID Take 1 capsule (100 mg total) by mouth every 12 (twelve) hours. Started by: Zara Council, PA-C   ondansetron 4 MG disintegrating tablet Commonly known as: Zofran ODT Take 1 tablet (4 mg total) by mouth 2 (two) times daily as needed for nausea or vomiting.   propranolol ER 60 MG 24 hr capsule Commonly known as: INDERAL LA TK 1 C PO D   RABEprazole 20 MG tablet Commonly known as: ACIPHEX Take 1 tablet (20 mg total) by mouth 2 (two) times daily before a meal.   rizatriptan 10 MG tablet Commonly known as: MAXALT Take 10 mg by mouth as needed. May repeat in 2 hours if needed   rosuvastatin 5 MG tablet Commonly known as: Crestor Take one tablet q Monday, Wednesday and Friday.   sucralfate 1 g tablet Commonly known as: Carafate Take 1 tablet (1 g  total) by mouth 2 (two) times daily as needed.   SUMAtriptan 100 MG tablet Commonly known as: IMITREX   topiramate 50 MG tablet Commonly known as: TOPAMAX TAKE 1 AND 1/2 TABLET BY MOUTH IN MORNING AND 1 TABLET BY MOUTH IN EVENING   traZODone 50 MG tablet Commonly known as: DESYREL Take 50 mg by mouth at bedtime.       Allergies:  Allergies  Allergen Reactions   Amitiza [Lubiprostone] Other (See Comments)   Sulfa Antibiotics     Family History: Family History  Problem Relation Age of Onset   CAD Mother        s/p CABG   Hypercholesterolemia Mother    Breast cancer Mother 30   Heart disease Mother    Lung cancer Mother    Hypertension Father    Migraines Father    Hypercholesterolemia Father    Lung  cancer Father    Migraines Sister    Arthritis Sister    Lymphoma Other        grandmother   Colon cancer Neg Hx     Social History:  reports that she has never smoked. She has never used smokeless tobacco. She reports current alcohol use. She reports that she does not use drugs.   Physical Exam: BP 130/72    Pulse 68    Ht 5\' 4"  (1.626 m)    Wt 121 lb (54.9 kg)    BMI 20.77 kg/m   Constitutional:  Well nourished. Alert and oriented, No acute distress. HEENT: [place] AT, mask in place.  Trachea midline Cardiovascular: No clubbing, cyanosis, or edema. Respiratory: Normal respiratory effort, no increased work of breathing. Neurologic: Grossly intact, no focal deficits, moving all 4 extremities. Psychiatric: Normal mood and affect.   Laboratory Data: Lab Results  Component Value Date   CREATININE 0.88 11/21/2020  Urinalysis Results for orders placed or performed during the hospital encounter of 11/21/20  Urinalysis, Complete w Microscopic  Result Value Ref Range   Color, Urine YELLOW YELLOW   APPearance CLEAR CLEAR   Specific Gravity, Urine 1.020 1.005 - 1.030   pH 6.5 5.0 - 8.0   Glucose, UA NEGATIVE NEGATIVE mg/dL   Hgb urine dipstick NEGATIVE  NEGATIVE   Bilirubin Urine NEGATIVE NEGATIVE   Ketones, ur NEGATIVE NEGATIVE mg/dL   Protein, ur NEGATIVE NEGATIVE mg/dL   Nitrite NEGATIVE NEGATIVE   Leukocytes,Ua SMALL (A) NEGATIVE   Squamous Epithelial / LPF 0-5 0 - 5   WBC, UA 6-10 0 - 5 WBC/hpf   RBC / HPF 0-5 0 - 5 RBC/hpf   Bacteria, UA FEW (A) NONE SEEN   Mucus PRESENT   Results for orders placed or performed in visit on 11/21/20  BLADDER SCAN AMB NON-IMAGING  Result Value Ref Range   Scan Result 18 ml   Results for orders placed or performed in visit on 11/21/20  CBC with Differential/Platelet  Result Value Ref Range   WBC 3.2 (L) 4.0 - 10.5 K/uL   RBC 4.04 3.87 - 5.11 Mil/uL   Hemoglobin 11.6 (L) 12.0 - 15.0 g/dL   HCT 35.3 (L) 36.0 - 46.0 %   MCV 87.3 78.0 - 100.0 fl   MCHC 33.0 30.0 - 36.0 g/dL   RDW 14.4 11.5 - 15.5 %   Platelets 119.0 (L) 150.0 - 400.0 K/uL   Neutrophils Relative % 41.7 (L) 43.0 - 77.0 %   Lymphocytes Relative 37.2 12.0 - 46.0 %   Monocytes Relative 10.9 3.0 - 12.0 %   Eosinophils Relative 9.7 (H) 0.0 - 5.0 %   Basophils Relative 0.5 0.0 - 3.0 %   Neutro Abs 1.4 1.4 - 7.7 K/uL   Lymphs Abs 1.2 0.7 - 4.0 K/uL   Monocytes Absolute 0.4 0.1 - 1.0 K/uL   Eosinophils Absolute 0.3 0.0 - 0.7 K/uL   Basophils Absolute 0.0 0.0 - 0.1 K/uL  Hepatic function panel  Result Value Ref Range   Total Bilirubin 0.5 0.2 - 1.2 mg/dL   Bilirubin, Direct 0.1 0.0 - 0.3 mg/dL   Alkaline Phosphatase 44 39 - 117 U/L   AST 17 0 - 37 U/L   ALT 8 0 - 35 U/L   Total Protein 6.0 6.0 - 8.3 g/dL   Albumin 3.7 3.5 - 5.2 g/dL  Lipid panel  Result Value Ref Range   Cholesterol 127 0 - 200 mg/dL   Triglycerides 64.0 0.0 -  149.0 mg/dL   HDL 59.20 >39.00 mg/dL   VLDL 12.8 0.0 - 40.0 mg/dL   LDL Cholesterol 55 0 - 99 mg/dL   Total CHOL/HDL Ratio 2    NonHDL 67.84   Vitamin B12  Result Value Ref Range   Vitamin B-12 329 211 - 911 pg/mL  Basic metabolic panel  Result Value Ref Range   Sodium 142 135 - 145 mEq/L    Potassium 3.9 3.5 - 5.1 mEq/L   Chloride 107 96 - 112 mEq/L   CO2 31 19 - 32 mEq/L   Glucose, Bld 91 70 - 99 mg/dL   BUN 19 6 - 23 mg/dL   Creatinine, Ser 0.88 0.40 - 1.20 mg/dL   GFR 65.67 >60.00 mL/min   Calcium 9.0 8.4 - 10.5 mg/dL  IBC + Ferritin  Result Value Ref Range   Iron 84 42 - 145 ug/dL   Transferrin 251.0 212.0 - 360.0 mg/dL   Saturation Ratios 23.9 20.0 - 50.0 %   Ferritin 12.0 10.0 - 291.0 ng/mL  I have reviewed the labs.  Pertinent Imaging: Results for GILIANA, VANTIL (MRN [id number]) as of 11/23/2020 09:18  Ref. Range 11/21/2020 11:40  Scan Result Unknown 18 ml   CLINICAL DATA:  Lower back pain, renal calculi  EXAM: ABDOMEN - 1 VIEW  COMPARISON:  05/23/2020  FINDINGS: Supine frontal views of the abdomen and pelvis are obtained. There are 3 distinct calculi overlying the right renal silhouette, largest measuring 5 mm. No significant change since prior study. No evidence of left-sided calculi. Bowel gas pattern is unremarkable. No acute bony abnormalities. Lung bases are clear.  IMPRESSION: 1. Stable right renal calculi, largest measuring 8 mm.   Electronically Signed   By: Randa Ngo M.D.   On: 11/22/2020 00:21  CLINICAL DATA:  Left lower quadrant discomfort and constipation for 1-2 weeks  EXAM: CT ABDOMEN AND PELVIS WITH CONTRAST  TECHNIQUE: Multidetector CT imaging of the abdomen and pelvis was performed using the standard protocol following bolus administration of intravenous contrast.  CONTRAST:  108mL OMNIPAQUE IOHEXOL 300 MG/ML  SOLN  COMPARISON:  10/15/2018  FINDINGS: Lower chest: No acute abnormality.  Hepatobiliary: No focal liver abnormality is seen. No gallstones, gallbladder wall thickening, or biliary dilatation.  Pancreas: Unremarkable. No pancreatic ductal dilatation or surrounding inflammatory changes.  Spleen: Normal in size without focal abnormality.  Adrenals/Urinary Tract: Normal appearance of  the adrenal glands. 7 mm stone noted within the inferior pole of the right kidney. No kidney mass or hydronephrosis. The urinary bladder is unremarkable.  Stomach/Bowel: Stomach appears normal. Status post appendectomy. No bowel wall thickening, inflammation, or distension.  Vascular/Lymphatic: Aortic atherosclerosis. No abdominopelvic adenopathy.  Reproductive: Status post hysterectomy.  No adnexal mass.  Other: No free fluid or fluid collections.  Musculoskeletal: Chronic superior endplate deformity at L5 is unchanged from previous exam. Sacral Tarlov cyst again noted  IMPRESSION: 1. No acute findings within the abdomen or pelvis. 2. Nonobstructing right renal calculus. 3. Aortic atherosclerosis.  Aortic Atherosclerosis (ICD10-I70.0).   Electronically Signed   By: Kerby Moors M.D.   On: 11/01/2020 08:42 I have personally reviewed the images and agree with radiologist interpretation and see HPI.  See HPI   Assessment & Plan:    1. Right nephrolithiasis  - Today's KUB remains stable  2. History of hematuria  - Hematuria work up completed in 02/2018 - findings positive for bilateral nephrolithiasis - UA negative for micro heme  3. Lower back pain with urgency -  UA sent for culture -Macrobid 100mg  Rx sent to pharmacy in [place].   Return for Pending urine culture results .   Fransico Him, am acting as a Education administrator for [employer],  I have reviewed the above documentation for accuracy and completeness, and I agree with the above.    Zara Council, PA-C   [facility] [address], [place], [place] [address] [phone]

## 2020-11-21 ENCOUNTER — Ambulatory Visit: Payer: Medicare Other | Admitting: Urology

## 2020-11-21 ENCOUNTER — Ambulatory Visit
Admission: RE | Admit: 2020-11-21 | Discharge: 2020-11-21 | Disposition: A | Discharge status: Home / Self Care | Payer: Medicare Other | Source: Ambulatory Visit | Attending: Urology | Admitting: Urology

## 2020-11-21 ENCOUNTER — Encounter: Payer: Self-pay | Admitting: Internal Medicine

## 2020-11-21 ENCOUNTER — Encounter: Payer: Self-pay | Admitting: Urology

## 2020-11-21 ENCOUNTER — Ambulatory Visit
Admission: RE | Admit: 2020-11-21 | Discharge: 2020-11-21 | Disposition: A | Discharge status: Home / Self Care | Payer: Medicare Other | Attending: Urology | Admitting: Urology

## 2020-11-21 ENCOUNTER — Other Ambulatory Visit: Payer: Self-pay

## 2020-11-21 ENCOUNTER — Ambulatory Visit (INDEPENDENT_AMBULATORY_CARE_PROVIDER_SITE_OTHER): Payer: Medicare Other | Admitting: Internal Medicine

## 2020-11-21 ENCOUNTER — Other Ambulatory Visit
Admission: RE | Admit: 2020-11-21 | Discharge: 2020-11-21 | Disposition: A | Discharge status: Home / Self Care | Payer: Medicare Other | Source: Home / Self Care | Attending: Urology | Admitting: Urology

## 2020-11-21 VITALS — BP 130/72 | HR 68 | Ht 64.0 in | Wt 121.0 lb

## 2020-11-21 VITALS — BP 118/70 | HR 75 | Temp 97.7°F | Resp 16 | Ht 64.0 in | Wt 120.6 lb

## 2020-11-21 DIAGNOSIS — N200 Calculus of kidney: Secondary | ICD-10-CM | POA: Diagnosis not present

## 2020-11-21 DIAGNOSIS — D649 Anemia, unspecified: Secondary | ICD-10-CM | POA: Diagnosis not present

## 2020-11-21 DIAGNOSIS — D72819 Decreased white blood cell count, unspecified: Secondary | ICD-10-CM

## 2020-11-21 DIAGNOSIS — R002 Palpitations: Secondary | ICD-10-CM | POA: Diagnosis not present

## 2020-11-21 DIAGNOSIS — M545 Low back pain, unspecified: Secondary | ICD-10-CM | POA: Diagnosis not present

## 2020-11-21 DIAGNOSIS — F32A Depression, unspecified: Secondary | ICD-10-CM

## 2020-11-21 DIAGNOSIS — K219 Gastro-esophageal reflux disease without esophagitis: Secondary | ICD-10-CM

## 2020-11-21 DIAGNOSIS — E7800 Pure hypercholesterolemia, unspecified: Secondary | ICD-10-CM | POA: Diagnosis not present

## 2020-11-21 DIAGNOSIS — R319 Hematuria, unspecified: Secondary | ICD-10-CM

## 2020-11-21 DIAGNOSIS — D696 Thrombocytopenia, unspecified: Secondary | ICD-10-CM

## 2020-11-21 DIAGNOSIS — R350 Frequency of micturition: Secondary | ICD-10-CM

## 2020-11-21 DIAGNOSIS — D61818 Other pancytopenia: Secondary | ICD-10-CM | POA: Insufficient documentation

## 2020-11-21 DIAGNOSIS — G43709 Chronic migraine without aura, not intractable, without status migrainosus: Secondary | ICD-10-CM

## 2020-11-21 DIAGNOSIS — F320 Major depressive disorder, single episode, mild: Secondary | ICD-10-CM

## 2020-11-21 DIAGNOSIS — F419 Anxiety disorder, unspecified: Secondary | ICD-10-CM

## 2020-11-21 DIAGNOSIS — R109 Unspecified abdominal pain: Secondary | ICD-10-CM | POA: Diagnosis not present

## 2020-11-21 DIAGNOSIS — K5900 Constipation, unspecified: Secondary | ICD-10-CM

## 2020-11-21 LAB — HEPATIC FUNCTION PANEL
ALT: 8 U/L (ref 0–35)
AST: 17 U/L (ref 0–37)
Albumin: 3.7 g/dL (ref 3.5–5.2)
Alkaline Phosphatase: 44 U/L (ref 39–117)
Bilirubin, Direct: 0.1 mg/dL (ref 0.0–0.3)
Total Bilirubin: 0.5 mg/dL (ref 0.2–1.2)
Total Protein: 6 g/dL (ref 6.0–8.3)

## 2020-11-21 LAB — BASIC METABOLIC PANEL WITH GFR
BUN: 19 mg/dL (ref 6–23)
CO2: 31 meq/L (ref 19–32)
Calcium: 9 mg/dL (ref 8.4–10.5)
Chloride: 107 meq/L (ref 96–112)
Creatinine, Ser: 0.88 mg/dL (ref 0.40–1.20)
GFR: 65.67 mL/min
Glucose, Bld: 91 mg/dL (ref 70–99)
Potassium: 3.9 meq/L (ref 3.5–5.1)
Sodium: 142 meq/L (ref 135–145)

## 2020-11-21 LAB — URINALYSIS, COMPLETE (UACMP) WITH MICROSCOPIC
Bilirubin Urine: NEGATIVE
Glucose, UA: NEGATIVE mg/dL
Hgb urine dipstick: NEGATIVE
Ketones, ur: NEGATIVE mg/dL
Nitrite: NEGATIVE
Protein, ur: NEGATIVE mg/dL
Specific Gravity, Urine: 1.02 (ref 1.005–1.030)
pH: 6.5 (ref 5.0–8.0)

## 2020-11-21 LAB — CBC WITH DIFFERENTIAL/PLATELET
Basophils Absolute: 0 10*3/uL (ref 0.0–0.1)
Basophils Relative: 0.5 % (ref 0.0–3.0)
Eosinophils Absolute: 0.3 10*3/uL (ref 0.0–0.7)
Eosinophils Relative: 9.7 % — ABNORMAL HIGH (ref 0.0–5.0)
HCT: 35.3 % — ABNORMAL LOW (ref 36.0–46.0)
Hemoglobin: 11.6 g/dL — ABNORMAL LOW (ref 12.0–15.0)
Lymphocytes Relative: 37.2 % (ref 12.0–46.0)
Lymphs Abs: 1.2 10*3/uL (ref 0.7–4.0)
MCHC: 33 g/dL (ref 30.0–36.0)
MCV: 87.3 fl (ref 78.0–100.0)
Monocytes Absolute: 0.4 10*3/uL (ref 0.1–1.0)
Monocytes Relative: 10.9 % (ref 3.0–12.0)
Neutro Abs: 1.4 10*3/uL (ref 1.4–7.7)
Neutrophils Relative %: 41.7 % — ABNORMAL LOW (ref 43.0–77.0)
Platelets: 119 10*3/uL — ABNORMAL LOW (ref 150.0–400.0)
RBC: 4.04 Mil/uL (ref 3.87–5.11)
RDW: 14.4 % (ref 11.5–15.5)
WBC: 3.2 10*3/uL — ABNORMAL LOW (ref 4.0–10.5)

## 2020-11-21 LAB — IBC + FERRITIN
Ferritin: 12 ng/mL (ref 10.0–291.0)
Iron: 84 ug/dL (ref 42–145)
Saturation Ratios: 23.9 % (ref 20.0–50.0)
Transferrin: 251 mg/dL (ref 212.0–360.0)

## 2020-11-21 LAB — LIPID PANEL
Cholesterol: 127 mg/dL (ref 0–200)
HDL: 59.2 mg/dL (ref >39.00)
LDL Cholesterol: 55 mg/dL (ref 0–99)
NonHDL: 67.84
Total CHOL/HDL Ratio: 2
Triglycerides: 64 mg/dL (ref 0.0–149.0)
VLDL: 12.8 mg/dL (ref 0.0–40.0)

## 2020-11-21 LAB — BLADDER SCAN AMB NON-IMAGING: Scan Result: 18

## 2020-11-21 LAB — VITAMIN B12: Vitamin B-12: 329 pg/mL (ref 211–911)

## 2020-11-21 MED ORDER — RABEPRAZOLE SODIUM 20 MG PO TBEC: 20.0000 mg | DELAYED_RELEASE_TABLET | Freq: Two times a day (BID) | ORAL | 1 refills | Status: DC

## 2020-11-21 MED ORDER — ROSUVASTATIN CALCIUM 5 MG PO TABS
ORAL_TABLET | ORAL | 1 refills | Status: DC
Start: 2020-11-21 — End: 2021-05-16

## 2020-11-21 MED ORDER — NITROFURANTOIN MONOHYD MACRO 100 MG PO CAPS: 100.0000 mg | ORAL_CAPSULE | Freq: Two times a day (BID) | ORAL | 0 refills | Status: DC

## 2020-11-21 NOTE — Progress Notes (Signed)
Patient ID: Brandi Ramos, female   DOB: 01-08-1946, 75 y.o.   MRN: [id number]   Subjective:    Patient ID: Brandi Ramos, female    DOB: [birth year], 75 y.o.   MRN: [id number]  HPI This visit occurred during the SARS-CoV-2 public health emergency.  Safety protocols were in place, including screening questions prior to the visit, additional usage of staff PPE, and extensive cleaning of exam room while observing appropriate contact time as indicated for disinfecting solutions.  Patient here for a scheduled follow up. Saw GI 10/13/20 with persistent problems with constipation. Also was having LLQ pain.  Concern regarding possible diverticulitis.  Treated with flagyl and cipro.  Found to have UTI.  No urinary symptoms now.  CT scan - nonobstructing right renal calculus no acute abnormality.  No LLQ pain now.  Does have some intermittent low back pain.  May occur every few days.  No known triggers.  No radicular symptoms.  No urine or bowel change associated.  She does take correctol q 2-3 days.  Was informed by GI she could take qod.  Discussed.  Handling stress.  No chest pain or sob reported.  Does report noticing intermittent episodes of increased heart rate or palpitations - notices mostly when watching TV or in the evening.  No cough or congestion.    Past Medical History:  Diagnosis Date  . Anxiety   . Arthritis   . Chicken pox   . Depression   . Diverticulosis   . Fibrocystic breast disease   . Frozen shoulder   . Fundic gland polyps of stomach, benign   . GERD (gastroesophageal reflux disease)   . History of diverticulitis of colon   . Kidney stones   . Migraine headache   . Neutropenia ([place])    worked up - Dr Oliva Bustard, slightly elevated ANA  . PUD (peptic ulcer disease)   . Recurrent vaginitis   . Seasonal allergies   . 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    Past Surgical History:  Procedure Laterality Date  . ABDOMINAL HYSTERECTOMY  1984  .  COLONOSCOPY    . COLONOSCOPY WITH PROPOFOL N/A 09/26/2018   Procedure: COLONOSCOPY WITH PROPOFOL;  Surgeon: Lollie Sails, MD;  Location: [facility] ENDOSCOPY;  Service: Endoscopy;  Laterality: N/A;  . ESOPHAGOGASTRODUODENOSCOPY (EGD) WITH PROPOFOL N/A 10/31/2015   Procedure: ESOPHAGOGASTRODUODENOSCOPY (EGD) WITH PROPOFOL;  Surgeon: Lollie Sails, MD;  Location: [facility] ENDOSCOPY;  Service: Endoscopy;  Laterality: N/A;  . ESOPHAGOGASTRODUODENOSCOPY (EGD) WITH PROPOFOL N/A 09/26/2018   Procedure: ESOPHAGOGASTRODUODENOSCOPY (EGD) WITH PROPOFOL;  Surgeon: Lollie Sails, MD;  Location: [facility] ENDOSCOPY;  Service: Endoscopy;  Laterality: N/A;  . LAPAROSCOPIC APPENDECTOMY N/A 10/29/2018   Procedure: APPENDECTOMY LAPAROSCOPIC;  Surgeon: Herbert Pun, MD;  Location: [facility] ORS;  Service: General;  Laterality: N/A;  . LAPAROSCOPIC BILATERAL SALPINGO OOPHERECTOMY Bilateral 10/29/2018   Procedure: LAPAROSCOPIC BILATERAL SALPINGO OOPHORECTOMY;  Surgeon: Ward, Honor Loh, MD;  Location: [facility] ORS;  Service: Gynecology;  Laterality: Bilateral;   Family History  Problem Relation Age of Onset  . CAD Mother        s/p CABG  . Hypercholesterolemia Mother   . Breast cancer Mother 68  . Heart disease Mother   . Lung cancer Mother   . Hypertension Father   . Migraines Father   . Hypercholesterolemia Father   . Lung cancer Father   . Migraines Sister   . Arthritis Sister   . Lymphoma  Other        grandmother  . Colon cancer Neg Hx    Social History   Socioeconomic History  . Marital status: Widowed    Spouse name: Not on file  . Number of children: 2  . Years of education: Not on file  . Highest education level: Not on file  Occupational History  . Not on file  Tobacco Use  . Smoking status: Never Smoker  . Smokeless tobacco: Never Used  Vaping Use  . Vaping Use: Never used  Substance and Sexual Activity  . Alcohol use: Yes    Alcohol/week: 0.0 standard drinks    Comment: wine  occassionally  . Drug use: No  . Sexual activity: Not Currently  Other Topics Concern  . Not on file  Social History Narrative  . Not on file   Social Determinants of Health   Financial Resource Strain: Low Risk   . Difficulty of Paying Living Expenses: Not hard at all  Food Insecurity: No Food Insecurity  . Worried About Charity fundraiser in the Last Year: Never true  . Ran Out of Food in the Last Year: Never true  Transportation Needs: No Transportation Needs  . Lack of Transportation (Medical): No  . Lack of Transportation (Non-Medical): No  Physical Activity: Not on file  Stress: Not on file  Social Connections: Moderately Integrated  . Frequency of Communication with Friends and Family: More than three times a week  . Frequency of Social Gatherings with Friends and Family: More than three times a week  . Attends Religious Services: More than 4 times per year  . Active Member of Clubs or Organizations: Yes  . Attends Archivist Meetings: More than 4 times per year  . Marital Status: Widowed    Outpatient Encounter Medications as of 11/21/2020  Medication Sig  . ALPRAZolam (XANAX) 0.25 MG tablet TAKE 1 TABLET BY MOUTH EVERY DAY AS NEEDED  . busPIRone (BUSPAR) 10 MG tablet Take 1 tablet (10 mg total) by mouth daily.  . DULoxetine (CYMBALTA) 60 MG capsule Take 1 capsule (60 mg total) by mouth daily.  . fluticasone (FLONASE) 50 MCG/ACT nasal spray SHAKE LIQUID AND USE 2 SPRAYS IN EACH NOSTRIL DAILY  . loratadine (CLARITIN) 10 MG tablet Take 10 mg by mouth daily.  . ondansetron (ZOFRAN ODT) 4 MG disintegrating tablet Take 1 tablet (4 mg total) by mouth 2 (two) times daily as needed for nausea or vomiting.  . propranolol ER (INDERAL LA) 60 MG 24 hr capsule TK 1 C PO D  . RABEprazole (ACIPHEX) 20 MG tablet Take 1 tablet (20 mg total) by mouth 2 (two) times daily before a meal.  . rizatriptan (MAXALT) 10 MG tablet Take 10 mg by mouth as needed. May repeat in 2 hours if  needed  . rosuvastatin (CRESTOR) 5 MG tablet Take one tablet q Monday, Wednesday and Friday.  . sucralfate (CARAFATE) 1 g tablet Take 1 tablet (1 g total) by mouth 2 (two) times daily as needed.  . SUMAtriptan (IMITREX) 100 MG tablet   . topiramate (TOPAMAX) 50 MG tablet TAKE 1 AND 1/2 TABLET BY MOUTH IN MORNING AND 1 TABLET BY MOUTH IN EVENING  . traZODone (DESYREL) 50 MG tablet Take 50 mg by mouth at bedtime.  . [DISCONTINUED] AIMOVIG 140 DOSE 70 MG/ML SOAJ INJ 1 SYRINGE UTD Q 4 WKS  . [DISCONTINUED] LINZESS 145 MCG CAPS capsule Take 145 mcg by mouth daily.  . [DISCONTINUED] RABEprazole (ACIPHEX)  20 MG tablet Take 1 tablet (20 mg total) by mouth 2 (two) times daily before a meal.  . [DISCONTINUED] rosuvastatin (CRESTOR) 5 MG tablet Take one tablet q Monday, Wednesday and Friday.   No facility-administered encounter medications on file as of 11/21/2020.    Review of Systems  Constitutional: Negative for appetite change and unexpected weight change.  HENT: Negative for congestion and sinus pressure.   Respiratory: Negative for cough, chest tightness and shortness of breath.   Cardiovascular: Positive for palpitations. Negative for chest pain and leg swelling.  Gastrointestinal: Negative for abdominal pain, diarrhea, nausea and vomiting.  Genitourinary: Negative for difficulty urinating and dysuria.  Musculoskeletal: Negative for joint swelling and myalgias.       Intermittent low back pain as outlined.   Skin: Negative for color change and rash.  Neurological: Negative for dizziness, light-headedness and headaches.  Psychiatric/Behavioral: Negative for agitation and dysphoric mood.       Objective:    Physical Exam Vitals reviewed.  Constitutional:      General: She is not in acute distress.    Appearance: Normal appearance.  HENT:     Head: Normocephalic and atraumatic.     Right Ear: External ear normal.     Left Ear: External ear normal.  Eyes:     General: No scleral  icterus.       Right eye: No discharge.        Left eye: No discharge.     Conjunctiva/sclera: Conjunctivae normal.  Neck:     Thyroid: No thyromegaly.  Cardiovascular:     Rate and Rhythm: Normal rate and regular rhythm.  Pulmonary:     Effort: No respiratory distress.     Breath sounds: Normal breath sounds. No wheezing.  Abdominal:     General: Bowel sounds are normal.     Palpations: Abdomen is soft.     Tenderness: There is no abdominal tenderness.  Musculoskeletal:        General: No swelling or tenderness.     Cervical back: Neck supple. No tenderness.  Lymphadenopathy:     Cervical: No cervical adenopathy.  Skin:    Findings: No erythema or rash.  Neurological:     Mental Status: She is alert.  Psychiatric:        Mood and Affect: Mood normal.        Behavior: Behavior normal.     BP 118/70   Pulse 75   Temp 97.7 F (36.5 C) (Oral)   Resp 16   Ht 5\' 4"  (1.626 m)   Wt 120 lb 9.6 oz (54.7 kg)   SpO2 99%   BMI 20.70 kg/m  Wt Readings from Last 3 Encounters:  11/21/20 121 lb (54.9 kg)  11/21/20 120 lb 9.6 oz (54.7 kg)  08/09/20 119 lb (54 kg)     Lab Results  Component Value Date   WBC 3.2 (L) 11/21/2020   HGB 11.6 (L) 11/21/2020   HCT 35.3 (L) 11/21/2020   PLT 119.0 (L) 11/21/2020   GLUCOSE 91 11/21/2020   CHOL 127 11/21/2020   TRIG 64.0 11/21/2020   HDL 59.20 11/21/2020   LDLCALC 55 11/21/2020   ALT 8 11/21/2020   AST 17 11/21/2020   NA 142 11/21/2020   K 3.9 11/21/2020   CL 107 11/21/2020   CREATININE 0.88 11/21/2020   BUN 19 11/21/2020   CO2 31 11/21/2020   TSH 3.11 05/12/2020   HGBA1C 5.5 01/15/2018    CT ABDOMEN PELVIS W  CONTRAST  Result Date: 11/01/2020 CLINICAL DATA:  Left lower quadrant discomfort and constipation for 1-2 weeks EXAM: CT ABDOMEN AND PELVIS WITH CONTRAST TECHNIQUE: Multidetector CT imaging of the abdomen and pelvis was performed using the standard protocol following bolus administration of intravenous contrast.  CONTRAST:  51mL OMNIPAQUE IOHEXOL 300 MG/ML  SOLN COMPARISON:  10/15/2018 FINDINGS: Lower chest: No acute abnormality. Hepatobiliary: No focal liver abnormality is seen. No gallstones, gallbladder wall thickening, or biliary dilatation. Pancreas: Unremarkable. No pancreatic ductal dilatation or surrounding inflammatory changes. Spleen: Normal in size without focal abnormality. Adrenals/Urinary Tract: Normal appearance of the adrenal glands. 7 mm stone noted within the inferior pole of the right kidney. No kidney mass or hydronephrosis. The urinary bladder is unremarkable. Stomach/Bowel: Stomach appears normal. Status post appendectomy. No bowel wall thickening, inflammation, or distension. Vascular/Lymphatic: Aortic atherosclerosis. No abdominopelvic adenopathy. Reproductive: Status post hysterectomy.  No adnexal mass. Other: No free fluid or fluid collections. Musculoskeletal: Chronic superior endplate deformity at L5 is unchanged from previous exam. Sacral Tarlov cyst again noted IMPRESSION: 1. No acute findings within the abdomen or pelvis. 2. Nonobstructing right renal calculus. 3. Aortic atherosclerosis. Aortic Atherosclerosis (ICD10-I70.0). Electronically Signed   By: Kerby Moors M.D.   On: 11/01/2020 08:42       Assessment & Plan:   Problem List Items Addressed This Visit    Anemia    Recheck cbc.       Relevant Orders   Vitamin B12 (Completed)   IBC + Ferritin (Completed)   Anxiety    Continue cymbalta.  Stable.       Constipation    Seeing GI.  Discussed taking correctol qod.  Follow.       GERD (gastroesophageal reflux disease)    On aciphex.  Upper symptoms controlled.        Relevant Medications   RABEprazole (ACIPHEX) 20 MG tablet   Hematuria    Has f/u with urology today.        Hypercholesterolemia    Continue crestor.  Low cholesterol diet and exercise.  Follow lipid panel and liver function tests.       Relevant Medications   rosuvastatin (CRESTOR) 5 MG tablet    Other Relevant Orders   Hepatic function panel (Completed)   Lipid panel (Completed)   Basic metabolic panel (Completed)   Leukopenia    Stable.  Recheck cbc today.        Migraine headache    Followed by neurology.  On topamax.  Stable.       Relevant Medications   rosuvastatin (CRESTOR) 5 MG tablet   Mild depression (H[facility])    Continues cymbalta, trazodone and buspar.  Stable.  Follow.        Palpitations - Primary    Intermittent episodes of increased heart rate and palpitations.  No chest pain. Given a persistent intermittent issue, EKG  - SR with no acute ischemic change noted.  Discussed cardiology referral for further evaluation and treatment - question of need for monitor, echo, etc.  Pt agreeable.       Relevant Orders   EKG 12-Lead (Completed)   Ambulatory referral to Cardiology   Thrombocytopenia ([place])    Recheck cbc to confirm stable.       Relevant Orders   CBC with Differential/Platelet (Completed)       Einar Pheasant, MD

## 2020-11-23 ENCOUNTER — Telehealth: Payer: Self-pay | Admitting: Family Medicine

## 2020-11-23 LAB — URINE CULTURE: Culture: <10000 — AB

## 2020-11-23 NOTE — Telephone Encounter (Signed)
-----   Message from Nori Riis, PA-C sent at 11/23/2020 10:23 AM EDT ----- Please let Mrs. Nickle know that her urine culture was negative for infection.  She no longer needs to take the [place].  I would recommend that if she is still having symptoms, that we have a trial of Myrbetriq 25 mg daily.  We have samples available, so she would like to pick up some samples we could offer her that option.  I would then like to see her in 3 weeks for OAB questionnaire and PVR.

## 2020-11-23 NOTE — Telephone Encounter (Signed)
Patient notified and voiced understanding. Patient states she will go to [place] to pick up Myrbetriq samples. Follow up appointment has been scheduled.

## 2020-11-24 ENCOUNTER — Encounter: Payer: Self-pay | Admitting: Internal Medicine

## 2020-11-24 NOTE — Assessment & Plan Note (Signed)
Has f/u with urology today.

## 2020-11-24 NOTE — Assessment & Plan Note (Signed)
Continue crestor.  Low cholesterol diet and exercise. Follow lipid panel and liver function tests.   

## 2020-11-24 NOTE — Assessment & Plan Note (Signed)
Recheck cbc to confirm stable.

## 2020-11-24 NOTE — Assessment & Plan Note (Signed)
Intermittent episodes of increased heart rate and palpitations.  No chest pain. Given a persistent intermittent issue, EKG  - SR with no acute ischemic change noted.  Discussed cardiology referral for further evaluation and treatment - question of need for monitor, echo, etc.  Pt agreeable.

## 2020-11-24 NOTE — Assessment & Plan Note (Signed)
On aciphex.  Upper symptoms controlled.

## 2020-11-24 NOTE — Assessment & Plan Note (Signed)
Continue cymbalta.  Stable.   

## 2020-11-24 NOTE — Assessment & Plan Note (Signed)
Followed by neurology.  On topamax.  Stable.

## 2020-11-24 NOTE — Assessment & Plan Note (Signed)
Stable.  Recheck cbc today.

## 2020-11-24 NOTE — Assessment & Plan Note (Signed)
Recheck cbc.  

## 2020-11-24 NOTE — Assessment & Plan Note (Signed)
Seeing GI.  Discussed taking correctol qod.  Follow.

## 2020-11-24 NOTE — Assessment & Plan Note (Signed)
Continues cymbalta, trazodone and buspar.  Stable.  Follow.

## 2020-12-12 ENCOUNTER — Ambulatory Visit (INDEPENDENT_AMBULATORY_CARE_PROVIDER_SITE_OTHER): Payer: Medicare Other | Admitting: Internal Medicine

## 2020-12-12 ENCOUNTER — Other Ambulatory Visit: Payer: Self-pay

## 2020-12-12 VITALS — BP 120/68 | HR 75 | Temp 97.2°F | Resp 16 | Ht 64.0 in | Wt 121.0 lb

## 2020-12-12 DIAGNOSIS — R319 Hematuria, unspecified: Secondary | ICD-10-CM | POA: Diagnosis not present

## 2020-12-12 DIAGNOSIS — D72819 Decreased white blood cell count, unspecified: Secondary | ICD-10-CM | POA: Diagnosis not present

## 2020-12-12 DIAGNOSIS — F320 Major depressive disorder, single episode, mild: Secondary | ICD-10-CM

## 2020-12-12 DIAGNOSIS — F419 Anxiety disorder, unspecified: Secondary | ICD-10-CM

## 2020-12-12 DIAGNOSIS — D696 Thrombocytopenia, unspecified: Secondary | ICD-10-CM | POA: Diagnosis not present

## 2020-12-12 DIAGNOSIS — K219 Gastro-esophageal reflux disease without esophagitis: Secondary | ICD-10-CM

## 2020-12-12 DIAGNOSIS — D649 Anemia, unspecified: Secondary | ICD-10-CM

## 2020-12-12 DIAGNOSIS — F32A Depression, unspecified: Secondary | ICD-10-CM

## 2020-12-12 LAB — CBC WITH DIFFERENTIAL/PLATELET
Basophils Absolute: 0 10*3/uL (ref 0.0–0.1)
Basophils Relative: 0.6 % (ref 0.0–3.0)
Eosinophils Absolute: 0.2 10*3/uL (ref 0.0–0.7)
Eosinophils Relative: 6 % — ABNORMAL HIGH (ref 0.0–5.0)
HCT: 35.8 % — ABNORMAL LOW (ref 36.0–46.0)
Hemoglobin: 11.9 g/dL — ABNORMAL LOW (ref 12.0–15.0)
Lymphocytes Relative: 37.1 % (ref 12.0–46.0)
Lymphs Abs: 1.5 10*3/uL (ref 0.7–4.0)
MCHC: 33.3 g/dL (ref 30.0–36.0)
MCV: 87.1 fl (ref 78.0–100.0)
Monocytes Absolute: 0.4 10*3/uL (ref 0.1–1.0)
Monocytes Relative: 10 % (ref 3.0–12.0)
Neutro Abs: 1.9 10*3/uL (ref 1.4–7.7)
Neutrophils Relative %: 46.3 % (ref 43.0–77.0)
Platelets: 133 10*3/uL — ABNORMAL LOW (ref 150.0–400.0)
RBC: 4.11 Mil/uL (ref 3.87–5.11)
RDW: 14.2 % (ref 11.5–15.5)
WBC: 4.1 10*3/uL (ref 4.0–10.5)

## 2020-12-12 MED ORDER — INTEGRA 62.5-62.5-40-3 MG PO CAPS: ORAL_CAPSULE | ORAL | 1 refills | Status: DC

## 2020-12-12 NOTE — Progress Notes (Signed)
Patient ID: Brandi Ramos, female   DOB: [birth year], 75 y.o.   MRN: [id number]   Subjective:    Patient ID: Brandi Ramos, female    DOB: 23-Jan-1946, 75 y.o.   MRN: [id number]  HPI This visit occurred during the SARS-CoV-2 public health emergency.  Safety protocols were in place, including screening questions prior to the visit, additional usage of staff PPE, and extensive cleaning of exam room while observing appropriate contact time as indicated for disinfecting solutions.  Patient here for work in appt.  Work in to discuss lab results.  She has a history of leukopenia.  Recent cbc revealed slightly decreased white blood cell count, hgb and platelet count.  Also - low normal B12.  Discussed starting oral B12.  Also discussed referral to hematology.  She is eating.  No nausea or vomiting.  Bowels moving.  No abdominal pain.  No cough or congestion.  No chest pain or sob.  Seeing urology.  Trial of myrbetriq.    Past Medical History:  Diagnosis Date  . Anxiety   . Arthritis   . Chicken pox   . Depression   . Diverticulosis   . Fibrocystic breast disease   . Frozen shoulder   . Fundic gland polyps of stomach, benign   . GERD (gastroesophageal reflux disease)   . History of diverticulitis of colon   . Kidney stones   . Migraine headache   . Neutropenia ([place])    worked up - Dr Oliva Bustard, slightly elevated ANA  . PUD (peptic ulcer disease)   . Recurrent vaginitis   . Seasonal allergies   . 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    Past Surgical History:  Procedure Laterality Date  . ABDOMINAL HYSTERECTOMY  1984  . COLONOSCOPY    . COLONOSCOPY WITH PROPOFOL N/A 09/26/2018   Procedure: COLONOSCOPY WITH PROPOFOL;  Surgeon: Lollie Sails, MD;  Location: [facility] ENDOSCOPY;  Service: Endoscopy;  Laterality: N/A;  . ESOPHAGOGASTRODUODENOSCOPY (EGD) WITH PROPOFOL N/A 10/31/2015   Procedure: ESOPHAGOGASTRODUODENOSCOPY (EGD) WITH PROPOFOL;  Surgeon: Lollie Sails, MD;  Location: [facility] ENDOSCOPY;  Service: Endoscopy;  Laterality: N/A;  . ESOPHAGOGASTRODUODENOSCOPY (EGD) WITH PROPOFOL N/A 09/26/2018   Procedure: ESOPHAGOGASTRODUODENOSCOPY (EGD) WITH PROPOFOL;  Surgeon: Lollie Sails, MD;  Location: [facility] ENDOSCOPY;  Service: Endoscopy;  Laterality: N/A;  . LAPAROSCOPIC APPENDECTOMY N/A 10/29/2018   Procedure: APPENDECTOMY LAPAROSCOPIC;  Surgeon: Herbert Pun, MD;  Location: [facility] ORS;  Service: General;  Laterality: N/A;  . LAPAROSCOPIC BILATERAL SALPINGO OOPHERECTOMY Bilateral 10/29/2018   Procedure: LAPAROSCOPIC BILATERAL SALPINGO OOPHORECTOMY;  Surgeon: Ward, Honor Loh, MD;  Location: [facility] ORS;  Service: Gynecology;  Laterality: Bilateral;   Family History  Problem Relation Age of Onset  . CAD Mother        s/p CABG  . Hypercholesterolemia Mother   . Breast cancer Mother 69  . Heart disease Mother   . Lung cancer Mother   . Hypertension Father   . Migraines Father   . Hypercholesterolemia Father   . Lung cancer Father   . Migraines Sister   . Arthritis Sister   . Lymphoma Other        grandmother  . Colon cancer Neg Hx    Social History   Socioeconomic History  . Marital status: Widowed    Spouse name: Not on file  . Number of children: 2  . Years of education: Not on file  . Highest education level: Not  on file  Occupational History  . Not on file  Tobacco Use  . Smoking status: Never Smoker  . Smokeless tobacco: Never Used  Vaping Use  . Vaping Use: Never used  Substance and Sexual Activity  . Alcohol use: Yes    Alcohol/week: 0.0 standard drinks    Comment: wine occassionally  . Drug use: No  . Sexual activity: Not Currently  Other Topics Concern  . Not on file  Social History Narrative  . Not on file   Social Determinants of Health   Financial Resource Strain: Low Risk   . Difficulty of Paying Living Expenses: Not hard at all  Food Insecurity: No Food Insecurity  . Worried About Sales executive in the Last Year: Never true  . Ran Out of Food in the Last Year: Never true  Transportation Needs: No Transportation Needs  . Lack of Transportation (Medical): No  . Lack of Transportation (Non-Medical): No  Physical Activity: Not on file  Stress: Not on file  Social Connections: Moderately Integrated  . Frequency of Communication with Friends and Family: More than three times a week  . Frequency of Social Gatherings with Friends and Family: More than three times a week  . Attends Religious Services: More than 4 times per year  . Active Member of Clubs or Organizations: Yes  . Attends Archivist Meetings: More than 4 times per year  . Marital Status: Widowed    Outpatient Encounter Medications as of 12/12/2020  Medication Sig  . ALPRAZolam (XANAX) 0.25 MG tablet TAKE 1 TABLET BY MOUTH EVERY DAY AS NEEDED  . busPIRone (BUSPAR) 10 MG tablet Take 1 tablet (10 mg total) by mouth daily.  . DULoxetine (CYMBALTA) 60 MG capsule Take 1 capsule (60 mg total) by mouth daily.  . Fe Fum-FePoly-Vit C-Vit B3 (INTEGRA) 62.5-62.5-40-3 MG CAPS One capsule per day  . fluticasone (FLONASE) 50 MCG/ACT nasal spray SHAKE LIQUID AND USE 2 SPRAYS IN EACH NOSTRIL DAILY  . LINZESS 145 MCG CAPS capsule Take 145 mcg by mouth daily.  Marland Kitchen loratadine (CLARITIN) 10 MG tablet Take 10 mg by mouth daily.  . nitrofurantoin, macrocrystal-monohydrate, (MACROBID) 100 MG capsule Take 1 capsule (100 mg total) by mouth every 12 (twelve) hours.  . ondansetron (ZOFRAN ODT) 4 MG disintegrating tablet Take 1 tablet (4 mg total) by mouth 2 (two) times daily as needed for nausea or vomiting.  . propranolol ER (INDERAL LA) 60 MG 24 hr capsule TK 1 C PO D  . RABEprazole (ACIPHEX) 20 MG tablet Take 1 tablet (20 mg total) by mouth 2 (two) times daily before a meal.  . rizatriptan (MAXALT) 10 MG tablet Take 10 mg by mouth as needed. May repeat in 2 hours if needed  . rosuvastatin (CRESTOR) 5 MG tablet Take one tablet q Monday,  Wednesday and Friday.  . sucralfate (CARAFATE) 1 g tablet Take 1 tablet (1 g total) by mouth 2 (two) times daily as needed.  . SUMAtriptan (IMITREX) 100 MG tablet   . topiramate (TOPAMAX) 50 MG tablet TAKE 1 AND 1/2 TABLET BY MOUTH IN MORNING AND 1 TABLET BY MOUTH IN EVENING  . traZODone (DESYREL) 50 MG tablet Take 50 mg by mouth at bedtime.   No facility-administered encounter medications on file as of 12/12/2020.    Review of Systems  Constitutional: Negative for appetite change and unexpected weight change.  HENT: Negative for congestion and sinus pressure.   Respiratory: Negative for cough, chest tightness and shortness  of breath.   Cardiovascular: Negative for chest pain, palpitations and leg swelling.  Gastrointestinal: Negative for abdominal pain, diarrhea, nausea and vomiting.  Genitourinary: Negative for difficulty urinating and dysuria.  Musculoskeletal: Negative for joint swelling and myalgias.  Skin: Negative for color change and rash.  Neurological: Negative for dizziness, light-headedness and headaches.  Psychiatric/Behavioral: Negative for agitation and dysphoric mood.       Objective:    Physical Exam Vitals reviewed.  Constitutional:      General: She is not in acute distress.    Appearance: Normal appearance.  HENT:     Head: Normocephalic and atraumatic.     Right Ear: External ear normal.     Left Ear: External ear normal.  Eyes:     General: No scleral icterus.       Right eye: No discharge.        Left eye: No discharge.     Conjunctiva/sclera: Conjunctivae normal.  Neck:     Thyroid: No thyromegaly.  Cardiovascular:     Rate and Rhythm: Normal rate and regular rhythm.  Pulmonary:     Effort: No respiratory distress.     Breath sounds: Normal breath sounds. No wheezing.  Abdominal:     General: Bowel sounds are normal.     Palpations: Abdomen is soft.     Tenderness: There is no abdominal tenderness.  Musculoskeletal:        General: No swelling  or tenderness.     Cervical back: Neck supple. No tenderness.  Lymphadenopathy:     Cervical: No cervical adenopathy.  Skin:    Findings: No erythema or rash.  Neurological:     Mental Status: She is alert.  Psychiatric:        Mood and Affect: Mood normal.        Behavior: Behavior normal.     BP 120/68   Pulse 75   Temp (!) 97.2 F (36.2 C) (Oral)   Resp 16   Ht 5\' 4"  (1.626 m)   Wt 121 lb (54.9 kg)   SpO2 99%   BMI 20.77 kg/m  Wt Readings from Last 3 Encounters:  12/12/20 121 lb (54.9 kg)  11/21/20 121 lb (54.9 kg)  11/21/20 120 lb 9.6 oz (54.7 kg)     Lab Results  Component Value Date   WBC 4.1 12/12/2020   HGB 11.9 (L) 12/12/2020   HCT 35.8 (L) 12/12/2020   PLT 133.0 (L) 12/12/2020   GLUCOSE 91 11/21/2020   CHOL 127 11/21/2020   TRIG 64.0 11/21/2020   HDL 59.20 11/21/2020   LDLCALC 55 11/21/2020   ALT 8 11/21/2020   AST 17 11/21/2020   NA 142 11/21/2020   K 3.9 11/21/2020   CL 107 11/21/2020   CREATININE 0.88 11/21/2020   BUN 19 11/21/2020   CO2 31 11/21/2020   TSH 3.11 05/12/2020   HGBA1C 5.5 01/15/2018    DG Abd 1 View  Result Date: 11/22/2020 CLINICAL DATA:  Lower back pain, renal calculi EXAM: ABDOMEN - 1 VIEW COMPARISON:  05/23/2020 FINDINGS: Supine frontal views of the abdomen and pelvis are obtained. There are 3 distinct calculi overlying the right renal silhouette, largest measuring 5 mm. No significant change since prior study. No evidence of left-sided calculi. Bowel gas pattern is unremarkable. No acute bony abnormalities. Lung bases are clear. IMPRESSION: 1. Stable right renal calculi, largest measuring 8 mm. Electronically Signed   By: Randa Ngo M.D.   On: 11/22/2020 00:21  Assessment & Plan:   Problem List Items Addressed This Visit    Anemia    Recent hgb slightly decreased.  Start oral B12.  Recheck cbc today.       Relevant Medications   Fe Fum-FePoly-Vit C-Vit B3 (INTEGRA) 62.5-62.5-40-3 MG CAPS   Anxiety     Continue cymbalta and buspar. Stable.       GERD (gastroesophageal reflux disease)    No upper symptoms reported.  Continue aciphex.       Hematuria    Seeing hematology.        Leukopenia    Given pancytopenia, discussed possible hematology referral.  Recheck cbc today.       Mild depression ([place])    Continues trazodone, cymbalta and buspar.  Stable.  Follow.       Thrombocytopenia ([place]) - Primary    Recent platelet count decreased. Pancytopenia noted.  Start oral B12.  Recheck cbc. Discussed possible hematology referral if persistent decrease.       Relevant Orders   CBC with Differential/Platelet (Completed)       Einar Pheasant, MD

## 2020-12-14 ENCOUNTER — Ambulatory Visit: Payer: Self-pay | Admitting: Urology

## 2020-12-14 ENCOUNTER — Telehealth: Payer: Self-pay | Admitting: Internal Medicine

## 2020-12-14 NOTE — Telephone Encounter (Signed)
-----   Message from Cammie Sickle, MD sent at 12/13/2020  8:30 PM EDT ----- Regarding: RE: question Hi Dr.Laquinton Bihm- reviewed the labs, pt has had intermittent mild thrombocytopenia [> 100] for many years now; with chronic intermittent mild neutropenia- I agree with you re: continued surveillance. I will be happy to consult if her counts are worsening.  GB  ----- Message ----- From: Einar Pheasant, MD Sent: 12/13/2020   3:17 AM EDT To: Cammie Sickle, MD Subject: question                                       Ms Osterloh has a history of leukopenia.  Recent labs:  anemia and thrombocytopenia.  Also low normal B12 and iron stores.  Started on oral B12.  Trial of oral iron.  Given the persistent leukopenia and now thrombocytopenia, is any further w/up warranted at this time.  I can schedule her an appt to see you (to review) if necessary.  Thank you for your help.    Thanks again  [employer]

## 2020-12-16 NOTE — Progress Notes (Signed)
Error

## 2020-12-17 ENCOUNTER — Encounter: Payer: Self-pay | Admitting: Internal Medicine

## 2020-12-17 NOTE — Assessment & Plan Note (Signed)
Given pancytopenia, discussed possible hematology referral.  Recheck cbc today.

## 2020-12-17 NOTE — Assessment & Plan Note (Signed)
Seeing hematology.   

## 2020-12-17 NOTE — Assessment & Plan Note (Signed)
Continues trazodone, cymbalta and buspar.  Stable.  Follow.

## 2020-12-17 NOTE — Assessment & Plan Note (Signed)
No upper symptoms reported.  Continue aciphex.

## 2020-12-17 NOTE — Assessment & Plan Note (Signed)
Recent hgb slightly decreased.  Start oral B12.  Recheck cbc today.

## 2020-12-17 NOTE — Assessment & Plan Note (Signed)
Continue cymbalta and buspar. Stable.

## 2020-12-17 NOTE — Assessment & Plan Note (Signed)
Recent platelet count decreased. Pancytopenia noted.  Start oral B12.  Recheck cbc. Discussed possible hematology referral if persistent decrease.

## 2020-12-19 ENCOUNTER — Encounter: Payer: Self-pay | Admitting: Urology

## 2020-12-19 ENCOUNTER — Ambulatory Visit: Payer: Medicare Other | Admitting: Urology

## 2020-12-19 ENCOUNTER — Other Ambulatory Visit: Payer: Self-pay

## 2020-12-19 VITALS — BP 150/69 | HR 69 | Ht 64.0 in | Wt 120.0 lb

## 2020-12-19 DIAGNOSIS — R3915 Urgency of urination: Secondary | ICD-10-CM

## 2020-12-19 LAB — BLADDER SCAN AMB NON-IMAGING: Scan Result: 0

## 2020-12-19 MED ORDER — MIRABEGRON ER 50 MG PO TB24
50.0000 mg | ORAL_TABLET | Freq: Every day | ORAL | 0 refills | Status: DC
Start: 2020-12-19 — End: 2021-01-04

## 2020-12-19 NOTE — Progress Notes (Signed)
11/21/2020 11:06 AM   Brandi Ramos 1945/11/01 [id number]  Referring provider: Einar Pheasant, [address] [place],  [place] [address] Chief Complaint  Patient presents with  . Follow-up    3 week follow-up   Urological history: 1. High risk hematuria - Non-smoker - CTU 01/2018 Nonobstructive right nephrolithiasis includes a 3 mm upper pole calculus on image 73/5; an 8 mm in long axis lower pole calculus on image 63/5; and a 4 mm lower pole calculus on image 66/5.  Left-sided renal calculi include a 1-2 mm upper pole nonobstructive calculus and a 1-2 mm lower pole nonobstructive calculus. There is also faint calcification along the left renal capsule on image 60/5 medially.  Some cortical hypodensity in thinning in the left mid upper kidney adjacent to the spleen. Given the slightly scalloped contour I favor that this is probably from remote prior scarring, and a similar appearance was present on 11/16/2015 - Cysto 02/2018 with Dr. Erlene Quan was negative -Contrast CT in 10/2018 Nonobstructing calculus in the lower pole of the right kidney measuring up to 7 mm on image 47/4. There are probable additional tiny renal calculi - Cystoscopy with Dr. Bernardo Heater in 11/2019 was NED - UA negative for micro heme  2. Nephrolithiasis - Contrast CT in 10/2018 noted there is a nonobstructing calculus in the lower pole of the right kidney measuring up to 7 mm on image 47/4. There are probable additional tiny renal calculi - Contrast CT in 10/2020 7 mm stone noted within the inferior pole of the right kidney. - KUB 11/21/2020 stable right renal calculi 8 mm   HPI: Brandi Ramos is a 75 y.o. female who presents today for a follow up after a trial of Myrbetriq 25 mg daily for urgency.  The patient is experiencing urgency x 0-3, frequency x 4-7, not restricting fluids to avoid visits to the restroom, not engaging in toilet mapping, incontinence x 0-3 and nocturia x 0-3. Her BP is  150/69. Her PVR is 0.   Patient reported that she never recieved the samples of myrbetriq, so she was given a 1 month supply of 50 mg samples of myrbetriq today. Denies having hematuria or dysuria. Back pain is gone. Patient reports trying to drink enough water, but reports that she has a slow stream when voiding. Patient reports less urgency.  PMH: Past Medical History:  Diagnosis Date  . Anxiety   . Arthritis   . Chicken pox   . Depression   . Diverticulosis   . Fibrocystic breast disease   . Frozen shoulder   . Fundic gland polyps of stomach, benign   . GERD (gastroesophageal reflux disease)   . History of diverticulitis of colon   . Kidney stones   . Migraine headache   . Neutropenia ([place])    worked up - Dr Oliva Bustard, slightly elevated ANA  . PUD (peptic ulcer disease)   . Recurrent vaginitis   . Seasonal allergies   . 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     Surgical History: Past Surgical History:  Procedure Laterality Date  . ABDOMINAL HYSTERECTOMY  1984  . COLONOSCOPY    . COLONOSCOPY WITH PROPOFOL N/A 09/26/2018   Procedure: COLONOSCOPY WITH PROPOFOL;  Surgeon: Lollie Sails, MD;  Location: [facility] ENDOSCOPY;  Service: Endoscopy;  Laterality: N/A;  . ESOPHAGOGASTRODUODENOSCOPY (EGD) WITH PROPOFOL N/A 10/31/2015   Procedure: ESOPHAGOGASTRODUODENOSCOPY (EGD) WITH PROPOFOL;  Surgeon: Lollie Sails, MD;  Location: [facility] ENDOSCOPY;  Service: Endoscopy;  Laterality: N/A;  . ESOPHAGOGASTRODUODENOSCOPY (EGD) WITH PROPOFOL N/A 09/26/2018   Procedure: ESOPHAGOGASTRODUODENOSCOPY (EGD) WITH PROPOFOL;  Surgeon: Lollie Sails, MD;  Location: [facility] ENDOSCOPY;  Service: Endoscopy;  Laterality: N/A;  . LAPAROSCOPIC APPENDECTOMY N/A 10/29/2018   Procedure: APPENDECTOMY LAPAROSCOPIC;  Surgeon: Herbert Pun, MD;  Location: [facility] ORS;  Service: General;  Laterality: N/A;  . LAPAROSCOPIC BILATERAL SALPINGO OOPHERECTOMY Bilateral 10/29/2018   Procedure:  LAPAROSCOPIC BILATERAL SALPINGO OOPHORECTOMY;  Surgeon: Ward, Honor Loh, MD;  Location: [facility] ORS;  Service: Gynecology;  Laterality: Bilateral;    Home Medications:  Allergies as of 12/19/2020      Reactions   Amitiza [lubiprostone] Other (See Comments)   Sulfa Antibiotics       Medication List       Accurate as of December 19, 2020 11:06 AM. If you have any questions, ask your nurse or doctor.        ALPRAZolam 0.25 MG tablet Commonly known as: XANAX TAKE 1 TABLET BY MOUTH EVERY DAY AS NEEDED   busPIRone 10 MG tablet Commonly known as: BUSPAR Take 1 tablet (10 mg total) by mouth daily.   DULoxetine 60 MG capsule Commonly known as: CYMBALTA Take 1 capsule (60 mg total) by mouth daily.   fluticasone 50 MCG/ACT nasal spray Commonly known as: FLONASE SHAKE LIQUID AND USE 2 SPRAYS IN EACH NOSTRIL DAILY   Integra 62.5-62.5-40-3 MG Caps One capsule per day   Linzess 145 MCG Caps capsule Generic drug: linaclotide Take 145 mcg by mouth daily.   loratadine 10 MG tablet Commonly known as: CLARITIN Take 10 mg by mouth daily.   mirabegron ER 50 MG Tb24 tablet Commonly known as: MYRBETRIQ Take 1 tablet (50 mg total) by mouth daily. Started by: Zara Council, PA-C   nitrofurantoin (macrocrystal-monohydrate) 100 MG capsule Commonly known as: MACROBID Take 1 capsule (100 mg total) by mouth every 12 (twelve) hours.   ondansetron 4 MG disintegrating tablet Commonly known as: Zofran ODT Take 1 tablet (4 mg total) by mouth 2 (two) times daily as needed for nausea or vomiting.   propranolol ER 60 MG 24 hr capsule Commonly known as: INDERAL LA TK 1 C PO D   RABEprazole 20 MG tablet Commonly known as: ACIPHEX Take 1 tablet (20 mg total) by mouth 2 (two) times daily before a meal.   rizatriptan 10 MG tablet Commonly known as: MAXALT Take 10 mg by mouth as needed. May repeat in 2 hours if needed   rosuvastatin 5 MG tablet Commonly known as: Crestor Take one tablet q  Monday, Wednesday and Friday.   sucralfate 1 g tablet Commonly known as: Carafate Take 1 tablet (1 g total) by mouth 2 (two) times daily as needed.   SUMAtriptan 100 MG tablet Commonly known as: IMITREX   topiramate 50 MG tablet Commonly known as: TOPAMAX TAKE 1 AND 1/2 TABLET BY MOUTH IN MORNING AND 1 TABLET BY MOUTH IN EVENING   traZODone 50 MG tablet Commonly known as: DESYREL Take 50 mg by mouth at bedtime.       Allergies:  Allergies  Allergen Reactions  . Amitiza [Lubiprostone] Other (See Comments)  . Sulfa Antibiotics     Family History: Family History  Problem Relation Age of Onset  . CAD Mother        s/p CABG  . Hypercholesterolemia Mother   . Breast cancer Mother 57  . Heart disease Mother   . Lung cancer Mother   .  Hypertension Father   . Migraines Father   . Hypercholesterolemia Father   . Lung cancer Father   . Migraines Sister   . Arthritis Sister   . Lymphoma Other        grandmother  . Colon cancer Neg Hx     Social History:  reports that she has never smoked. She has never used smokeless tobacco. She reports current alcohol use. She reports that she does not use drugs.   Physical Exam: BP (!) 150/69   Pulse 69   Ht 5\' 4"  (1.626 m)   Wt 120 lb (54.4 kg)   BMI 20.60 kg/m   Constitutional:  Well nourished. Alert and oriented, No acute distress. HEENT: [place] AT, moist mucus membranes.  Trachea midline, no masses. Cardiovascular: No clubbing, cyanosis, or edema. Respiratory: Normal respiratory effort, no increased work of breathing. Skin: No rashes, bruises or suspicious lesions. Lymph: No cervical or inguinal adenopathy. Neurologic: Grossly intact, no focal deficits, moving all 4 extremities. Psychiatric: Normal mood and affect.   Laboratory Data: Component     Latest Ref Rng & Units 12/12/2020  WBC     4.0 - 10.5 K/uL 4.1  RBC     3.87 - 5.11 Mil/uL 4.11  Hemoglobin     12.0 - 15.0 g/dL 11.9 (L)  HCT     36.0 - 46.0 % 35.8 (L)   MCV     78.0 - 100.0 fl 87.1  MCHC     30.0 - 36.0 g/dL 33.3  RDW     11.5 - 15.5 % 14.2  Platelets     150.0 - 400.0 K/uL 133.0 (L)  Neutrophils     43.0 - 77.0 % 46.3  Lymphocytes     12.0 - 46.0 % 37.1  Monocytes Relative     3.0 - 12.0 % 10.0  Eosinophil     0.0 - 5.0 % 6.0 (H)  Basophil     0.0 - 3.0 % 0.6  NEUT#     1.4 - 7.7 K/uL 1.9  Lymphocyte #     0.7 - 4.0 K/uL 1.5  Monocyte #     0.1 - 1.0 K/uL 0.4  Eosinophils Absolute     0.0 - 0.7 K/uL 0.2  Basophils Absolute     0.0 - 0.1 K/uL 0.0  WBC, UA     0 - 5 /hpf   Epithelial Cells (non renal)     0 - 10 /hpf   Renal Epithel, UA     None seen /hpf   Crystals     N/A   Crystal Type     N/A   Bacteria, UA     None seen/Few   Casts     None seen /lpf   Cast Type     N/A   I have reviewed the labs.  Pertinent Imaging: Results for orders placed or performed in visit on 12/19/20  BLADDER SCAN AMB NON-IMAGING  Result Value Ref Range   Scan Result 0 ml      Assessment & Plan:    1. OAB -patient was given Myrbetriq 50 mg samples today  Follow up:  Return in about 1 month (around 01/18/2021) for PVR and OAB questionnaire.    Zara Council, PA-C   I, Ardyth Gal, am acting as a Education administrator for [employer].  I have reviewed the above documentation for accuracy and completeness, and I agree with the above.    Zara Council, PA-C [facility]  [address], [place], [place] [address] [phone]

## 2020-12-23 ENCOUNTER — Ambulatory Visit: Payer: Medicare Other | Admitting: Cardiovascular Disease

## 2020-12-27 ENCOUNTER — Ambulatory Visit: Payer: Medicare Other | Admitting: Cardiovascular Disease

## 2020-12-29 ENCOUNTER — Telehealth: Payer: Self-pay | Admitting: Internal Medicine

## 2020-12-29 MED ORDER — ALPRAZOLAM 0.25 MG PO TABS: ORAL_TABLET | ORAL | 0 refills | Status: DC

## 2020-12-29 NOTE — Telephone Encounter (Signed)
Last refilled 01/28/20 Last OV 12/12/20 Next OV 01/06/21

## 2020-12-29 NOTE — Telephone Encounter (Signed)
rx ok'd for alprazolam #30 with no refills.   

## 2020-12-29 NOTE — Telephone Encounter (Signed)
Patient called in about refillALPRAZolam Brandi Ramos) 0.25 MG tablet

## 2020-12-29 NOTE — Telephone Encounter (Signed)
Rx sent in for xanax.  

## 2020-12-30 NOTE — Telephone Encounter (Signed)
Patient aware. Has appt next Friday.

## 2021-01-03 NOTE — Progress Notes (Signed)
Cardiology Office Note  Date:  01/04/2021   ID:  Brandi Ramos, DOB 1945-11-10, MRN [id number]  PCP:  Einar Pheasant, MD   Chief Complaint  Patient presents with  . New Patient (Initial Visit)    Ref by Dr. Nicki Reaper for palpitations. Medications reviewed by the patient verbally. Patient c/o palpitations for about 6 months that comes and goes but is worse at rest or when under stress.     HPI:  Ms. Brandi Ramos is a 75 year old woman with past medical history of Anxiety/depression Migraines Hyperlipidemia Who presents by referral from Dr. Nicki Reaper for symptoms of palpitations  Reports doing relatively well, Has appreciated palpitations over the past several months or so Feels when sitting in recliner at rest Day or night does not matter Feels like a flutter, comes in a cluster 1x a week, then goes away  Good exercise tolerance, denies shortness of breath or chest pain on exertion No prior smoking history, no diabetes  Tolerating Crestor medication, no myalgias  CT scan abdomen pelvis from February 2022 reviewed showing mild diffuse aortic atherosclerosis, calcified and noncalcified  EKG personally reviewed by myself on todays visit Shows normal sinus rhythm rate 71 bpm no significant ST or T wave changes   PMH:   has a past medical history of Anxiety, Arthritis, Chicken pox, Depression, Diverticulosis, Fibrocystic breast disease, Frozen shoulder, Fundic gland polyps of stomach, benign, GERD (gastroesophageal reflux disease), History of diverticulitis of colon, Kidney stones, Migraine headache, Neutropenia ([place]), PUD (peptic ulcer disease), Recurrent vaginitis, Seasonal allergies, and Skin cancer, basal cell.  PSH:    Past Surgical History:  Procedure Laterality Date  . ABDOMINAL HYSTERECTOMY  1984  . COLONOSCOPY    . COLONOSCOPY WITH PROPOFOL N/A 09/26/2018   Procedure: COLONOSCOPY WITH PROPOFOL;  Surgeon: Lollie Sails, MD;  Location[facility] ENDOSCOPY;  Service: Endoscopy;   Laterality: N/A;  . ESOPHAGOGASTRODUODENOSCOPY (EGD) WITH PROPOFOL N/A 10/31/2015   Procedure: ESOPHAGOGASTRODUODENOSCOPY (EGD) WITH PROPOFOL;  Surgeon: Lollie Sails, MD;  Location: [facility] ENDOSCOPY;  Service: Endoscopy;  Laterality: N/A;  . ESOPHAGOGASTRODUODENOSCOPY (EGD) WITH PROPOFOL N/A 09/26/2018   Procedure: ESOPHAGOGASTRODUODENOSCOPY (EGD) WITH PROPOFOL;  Surgeon: Lollie Sails, MD;  Location: [facility] ENDOSCOPY;  Service: Endoscopy;  Laterality: N/A;  . LAPAROSCOPIC APPENDECTOMY N/A 10/29/2018   Procedure: APPENDECTOMY LAPAROSCOPIC;  Surgeon: Herbert Pun, MD;  Location: [facility] ORS;  Service: General;  Laterality: N/A;  . LAPAROSCOPIC BILATERAL SALPINGO OOPHERECTOMY Bilateral 10/29/2018   Procedure: LAPAROSCOPIC BILATERAL SALPINGO OOPHORECTOMY;  Surgeon: Ward, Honor Loh, MD;  Location: [facility] ORS;  Service: Gynecology;  Laterality: Bilateral;    Current Outpatient Medications  Medication Sig Dispense Refill  . ALPRAZolam (XANAX) 0.25 MG tablet TAKE 1 TABLET BY MOUTH EVERY DAY AS NEEDED 30 tablet 0  . busPIRone (BUSPAR) 10 MG tablet Take 1 tablet (10 mg total) by mouth daily. 30 tablet 2  . DULoxetine (CYMBALTA) 60 MG capsule Take 1 capsule (60 mg total) by mouth daily. 90 capsule 1  . EMGALITY 120 MG/ML SOAJ every 30 (thirty) days.    . Fe Fum-FePoly-Vit C-Vit B3 (INTEGRA) 62.5-62.5-40-3 MG CAPS One capsule per day 30 capsule 1  . fluticasone (FLONASE) 50 MCG/ACT nasal spray SHAKE LIQUID AND USE 2 SPRAYS IN EACH NOSTRIL DAILY 16 g 11  . loratadine (CLARITIN) 10 MG tablet Take 10 mg by mouth daily.    . meloxicam (MOBIC) 15 MG tablet TAKE 1 TABLET BY MOUTH ONCE DAILY WITH A MEAL    . ondansetron (ZOFRAN ODT) 4 MG disintegrating  tablet Take 1 tablet (4 mg total) by mouth 2 (two) times daily as needed for nausea or vomiting. 14 tablet 0  . propranolol ER (INDERAL LA) 60 MG 24 hr capsule TK 1 C PO D    . RABEprazole (ACIPHEX) 20 MG tablet Take 1 tablet (20 mg total) by mouth 2 (two)  times daily before a meal. 180 tablet 1  . rizatriptan (MAXALT) 10 MG tablet Take 10 mg by mouth as needed. May repeat in 2 hours if needed    . rosuvastatin (CRESTOR) 5 MG tablet Take one tablet q Monday, Wednesday and Friday. 36 tablet 1  . sucralfate (CARAFATE) 1 g tablet Take 1 tablet (1 g total) by mouth 2 (two) times daily as needed. 60 tablet 0  . SUMAtriptan (IMITREX) 100 MG tablet     . topiramate (TOPAMAX) 50 MG tablet TAKE 1 AND 1/2 TABLET BY MOUTH IN MORNING AND 1 TABLET BY MOUTH IN EVENING    . traZODone (DESYREL) 50 MG tablet Take 50 mg by mouth at bedtime.     No current facility-administered medications for this visit.     Allergies:   Amitiza [lubiprostone] and Sulfa antibiotics   Social History:  The patient  reports that she has never smoked. She has never used smokeless tobacco. She reports current alcohol use. She reports that she does not use drugs.   Family History:   family history includes Arthritis in her sister; Breast cancer (age of onset: 56) in her mother; CAD in her mother; Heart disease in her mother; Hypercholesterolemia in her father and mother; Hypertension in her father; Lung cancer in her father and mother; Lymphoma in an other family member; Migraines in her father and sister.    Review of Systems: Review of Systems  Constitutional: Negative.   HENT: Negative.   Respiratory: Negative.   Cardiovascular: Negative.   Gastrointestinal: Negative.   Musculoskeletal: Negative.   Neurological: Negative.   Psychiatric/Behavioral: Negative.   All other systems reviewed and are negative.   PHYSICAL EXAM: VS:  BP 120/62 (BP Location: Right Arm, Patient Position: Sitting, Cuff Size: Normal)   Pulse 71   Ht 5\' 4"  (1.626 m)   Wt 119 lb 8 oz (54.2 kg)   SpO2 99%   BMI 20.51 kg/m  , BMI Body mass index is 20.51 kg/m. GEN: Well nourished, well developed, in no acute distress HEENT: normal Neck: no JVD, carotid bruits, or masses Cardiac: RRR; no murmurs,  rubs, or gallops,no edema  Respiratory:  clear to auscultation bilaterally, normal work of breathing GI: soft, nontender, nondistended, + BS MS: no deformity or atrophy Skin: warm and dry, no rash Neuro:  Strength and sensation are intact Psych: euthymic mood, full affect   Recent Labs: 05/12/2020: TSH 3.11 11/21/2020: ALT 8; BUN 19; Creatinine, Ser 0.88; Potassium 3.9; Sodium 142 12/12/2020: Hemoglobin 11.9; Platelets 133.0    Lipid Panel Lab Results  Component Value Date   CHOL 127 11/21/2020   HDL 59.20 11/21/2020   LDLCALC 55 11/21/2020   TRIG 64.0 11/21/2020     Wt Readings from Last 3 Encounters:  01/04/21 119 lb 8 oz (54.2 kg)  12/19/20 120 lb (54.4 kg)  12/12/20 121 lb (54.9 kg)      ASSESSMENT AND PLAN:  Problem List Items Addressed This Visit      Cardiology Problems   Migraine headache   Relevant Medications   meloxicam (MOBIC) 15 MG tablet   EMGALITY 120 MG/ML SOAJ  Other   Palpitations - Primary    Other Visit Diagnoses    Mixed hyperlipidemia         Palpitations Likely having APCs or PVCs, less likely atrial tachycardia or atrial fibrillation given short-lived nature of episodes, rare presentation -Recommend she continue her propranolol extended release 60 daily -For worsening symptoms could be prescribed short acting propranolol to take as needed -We did discuss a ZIO monitor if symptoms get worse Less likely structural heart disease, will hold off on echocardiogram or stress testing  Hyperlipidemia Dramatic improvement in numbers, recommend she continue on her statin  Anxiety Reassurance provided, doing well  Headaches Managed by outside physician, recommend she continue her propranolol  Aortic atherosclerosis Mild, seen on recent CT scan February 2022 Non-smoker, nondiabetic, cholesterol is at goal, no further work-up needed   Total encounter time more than 45 minutes  Greater than 50% was spent in counseling and coordination of  care with the patient    Signed, Esmond Plants, M.D., Ph.D. [place], [place]

## 2021-01-04 ENCOUNTER — Other Ambulatory Visit: Payer: Self-pay

## 2021-01-04 ENCOUNTER — Ambulatory Visit: Payer: Medicare Other | Admitting: Cardiovascular Disease

## 2021-01-04 ENCOUNTER — Encounter: Payer: Self-pay | Admitting: Cardiovascular Disease

## 2021-01-04 VITALS — BP 120/62 | HR 71 | Ht 64.0 in | Wt 119.5 lb

## 2021-01-04 DIAGNOSIS — I700 Atherosclerosis of aorta: Secondary | ICD-10-CM | POA: Diagnosis not present

## 2021-01-04 DIAGNOSIS — G43809 Other migraine, not intractable, without status migrainosus: Secondary | ICD-10-CM

## 2021-01-04 DIAGNOSIS — R002 Palpitations: Secondary | ICD-10-CM | POA: Diagnosis not present

## 2021-01-04 DIAGNOSIS — E782 Mixed hyperlipidemia: Secondary | ICD-10-CM | POA: Diagnosis not present

## 2021-01-04 NOTE — Patient Instructions (Signed)
Medication Instructions:  No changes  If you need a refill on your cardiac medications before your next appointment, please call your pharmacy.    Lab work: No new labs needed   If you have labs (blood work) drawn today and your tests are completely normal, you will receive your results only by: . MyChart Message (if you have MyChart) OR . A paper copy in the mail If you have any lab test that is abnormal or we need to change your treatment, we will call you to review the results.   Testing/Procedures: No new testing needed   Follow-Up: At [facility], you and your health needs are our priority.  As part of our continuing mission to provide you with exceptional heart care, we have created designated Provider Care Teams.  These Care Teams include your primary Cardiologist (physician) and Advanced Practice Providers (APPs -  Physician Assistants and Nurse Practitioners) who all work together to provide you with the care you need, when you need it.  . You will need a follow up appointment as needed  . Providers on your designated Care Team:   . Christopher Berge, NP . Ryan Dunn, PA-C . Jacquelyn Visser, PA-C  Any Other Special Instructions Will Be Listed Below (If Applicable).  COVID-19 Vaccine Information can be found at: [email or web address] For questions related to vaccine distribution or appointments, please email [email or web address] or call [phone].     

## 2021-01-06 ENCOUNTER — Encounter: Payer: Self-pay | Admitting: Internal Medicine

## 2021-01-06 ENCOUNTER — Ambulatory Visit (INDEPENDENT_AMBULATORY_CARE_PROVIDER_SITE_OTHER): Payer: Medicare Other | Admitting: Internal Medicine

## 2021-01-06 ENCOUNTER — Other Ambulatory Visit: Payer: Self-pay

## 2021-01-06 DIAGNOSIS — D649 Anemia, unspecified: Secondary | ICD-10-CM

## 2021-01-06 DIAGNOSIS — D696 Thrombocytopenia, unspecified: Secondary | ICD-10-CM

## 2021-01-06 DIAGNOSIS — F32A Depression, unspecified: Secondary | ICD-10-CM

## 2021-01-06 DIAGNOSIS — E7800 Pure hypercholesterolemia, unspecified: Secondary | ICD-10-CM

## 2021-01-06 DIAGNOSIS — R319 Hematuria, unspecified: Secondary | ICD-10-CM

## 2021-01-06 DIAGNOSIS — R002 Palpitations: Secondary | ICD-10-CM | POA: Diagnosis not present

## 2021-01-06 DIAGNOSIS — G43709 Chronic migraine without aura, not intractable, without status migrainosus: Secondary | ICD-10-CM

## 2021-01-06 DIAGNOSIS — F419 Anxiety disorder, unspecified: Secondary | ICD-10-CM

## 2021-01-06 DIAGNOSIS — K219 Gastro-esophageal reflux disease without esophagitis: Secondary | ICD-10-CM

## 2021-01-06 DIAGNOSIS — F320 Major depressive disorder, single episode, mild: Secondary | ICD-10-CM

## 2021-01-06 DIAGNOSIS — D72819 Decreased white blood cell count, unspecified: Secondary | ICD-10-CM

## 2021-01-06 DIAGNOSIS — K5900 Constipation, unspecified: Secondary | ICD-10-CM

## 2021-01-06 NOTE — Progress Notes (Signed)
Patient ID: Brandi Ramos, female   DOB: Jan 25, 1946, 75 y.o.   MRN: [id number]   Subjective:    Patient ID: Brandi Ramos, female    DOB: Dec 04, 1945, 75 y.o.   MRN: [id number]  HPI This visit occurred during the SARS-CoV-2 public health emergency.  Safety protocols were in place, including screening questions prior to the visit, additional usage of staff PPE, and extensive cleaning of exam room while observing appropriate contact time as indicated for disinfecting solutions.  Patient here for a scheduled follow up.  Here to follow up regarding increased stress, blood counts and headaches.  Increased stress recently.  A long term relationship just ended.  Discussed with her today.  She feels she is handling things ok.  Has good family support.  Has had to take an occasional xanax recently.  Trying to stay active.  No chest pain or sob reported.  No abdominal pain.  Bowels moving. emgality has helped.  Headaches stable.  Saw cardiology recently for palpitations.  Stable.   Past Medical History:  Diagnosis Date  . Anxiety   . Arthritis   . Chicken pox   . Depression   . Diverticulosis   . Fibrocystic breast disease   . Frozen shoulder   . Fundic gland polyps of stomach, benign   . GERD (gastroesophageal reflux disease)   . History of diverticulitis of colon   . Kidney stones   . Migraine headache   . Neutropenia ([place])    worked up - Dr Oliva Bustard, slightly elevated ANA  . PUD (peptic ulcer disease)   . Recurrent vaginitis   . Seasonal allergies   . 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    Past Surgical History:  Procedure Laterality Date  . ABDOMINAL HYSTERECTOMY  1984  . COLONOSCOPY    . COLONOSCOPY WITH PROPOFOL N/A 09/26/2018   Procedure: COLONOSCOPY WITH PROPOFOL;  Surgeon: Lollie Sails, MD;  Location: [facility] ENDOSCOPY;  Service: Endoscopy;  Laterality: N/A;  . ESOPHAGOGASTRODUODENOSCOPY (EGD) WITH PROPOFOL N/A 10/31/2015   Procedure:  ESOPHAGOGASTRODUODENOSCOPY (EGD) WITH PROPOFOL;  Surgeon: Lollie Sails, MD;  Location: [facility] ENDOSCOPY;  Service: Endoscopy;  Laterality: N/A;  . ESOPHAGOGASTRODUODENOSCOPY (EGD) WITH PROPOFOL N/A 09/26/2018   Procedure: ESOPHAGOGASTRODUODENOSCOPY (EGD) WITH PROPOFOL;  Surgeon: Lollie Sails, MD;  Location: [facility] ENDOSCOPY;  Service: Endoscopy;  Laterality: N/A;  . LAPAROSCOPIC APPENDECTOMY N/A 10/29/2018   Procedure: APPENDECTOMY LAPAROSCOPIC;  Surgeon: Herbert Pun, MD;  Location: [facility] ORS;  Service: General;  Laterality: N/A;  . LAPAROSCOPIC BILATERAL SALPINGO OOPHERECTOMY Bilateral 10/29/2018   Procedure: LAPAROSCOPIC BILATERAL SALPINGO OOPHORECTOMY;  Surgeon: Ward, Honor Loh, MD;  Location: [facility] ORS;  Service: Gynecology;  Laterality: Bilateral;   Family History  Problem Relation Age of Onset  . CAD Mother        s/p CABG  . Hypercholesterolemia Mother   . Breast cancer Mother 61  . Heart disease Mother   . Lung cancer Mother   . Hypertension Father   . Migraines Father   . Hypercholesterolemia Father   . Lung cancer Father   . Migraines Sister   . Arthritis Sister   . Lymphoma Other        grandmother  . Colon cancer Neg Hx    Social History   Socioeconomic History  . Marital status: Widowed    Spouse name: Not on file  . Number of children: 2  . Years of education: Not on file  .  Highest education level: Not on file  Occupational History  . Not on file  Tobacco Use  . Smoking status: Never Smoker  . Smokeless tobacco: Never Used  Vaping Use  . Vaping Use: Never used  Substance and Sexual Activity  . Alcohol use: Yes    Alcohol/week: 0.0 standard drinks    Comment: wine occassionally  . Drug use: No  . Sexual activity: Not Currently  Other Topics Concern  . Not on file  Social History Narrative  . Not on file   Social Determinants of Health   Financial Resource Strain: Low Risk   . Difficulty of Paying Living Expenses: Not hard at all  Food  Insecurity: No Food Insecurity  . Worried About Charity fundraiser in the Last Year: Never true  . Ran Out of Food in the Last Year: Never true  Transportation Needs: No Transportation Needs  . Lack of Transportation (Medical): No  . Lack of Transportation (Non-Medical): No  Physical Activity: Not on file  Stress: Not on file  Social Connections: Moderately Integrated  . Frequency of Communication with Friends and Family: More than three times a week  . Frequency of Social Gatherings with Friends and Family: More than three times a week  . Attends Religious Services: More than 4 times per year  . Active Member of Clubs or Organizations: Yes  . Attends Archivist Meetings: More than 4 times per year  . Marital Status: Widowed    Outpatient Encounter Medications as of 01/06/2021  Medication Sig  . ALPRAZolam (XANAX) 0.25 MG tablet TAKE 1 TABLET BY MOUTH EVERY DAY AS NEEDED  . busPIRone (BUSPAR) 10 MG tablet Take 1 tablet (10 mg total) by mouth daily.  . DULoxetine (CYMBALTA) 60 MG capsule Take 1 capsule (60 mg total) by mouth daily.  Marland Kitchen EMGALITY 120 MG/ML SOAJ every 30 (thirty) days.  . Fe Fum-FePoly-Vit C-Vit B3 (INTEGRA) 62.5-62.5-40-3 MG CAPS One capsule per day  . fluticasone (FLONASE) 50 MCG/ACT nasal spray SHAKE LIQUID AND USE 2 SPRAYS IN EACH NOSTRIL DAILY  . loratadine (CLARITIN) 10 MG tablet Take 10 mg by mouth daily.  . meloxicam (MOBIC) 15 MG tablet TAKE 1 TABLET BY MOUTH ONCE DAILY WITH A MEAL  . ondansetron (ZOFRAN ODT) 4 MG disintegrating tablet Take 1 tablet (4 mg total) by mouth 2 (two) times daily as needed for nausea or vomiting.  . propranolol ER (INDERAL LA) 60 MG 24 hr capsule TK 1 C PO D  . RABEprazole (ACIPHEX) 20 MG tablet Take 1 tablet (20 mg total) by mouth 2 (two) times daily before a meal.  . rizatriptan (MAXALT) 10 MG tablet Take 10 mg by mouth as needed. May repeat in 2 hours if needed  . rosuvastatin (CRESTOR) 5 MG tablet Take one tablet q Monday,  Wednesday and Friday.  . sucralfate (CARAFATE) 1 g tablet Take 1 tablet (1 g total) by mouth 2 (two) times daily as needed.  . SUMAtriptan (IMITREX) 100 MG tablet   . topiramate (TOPAMAX) 50 MG tablet TAKE 1 AND 1/2 TABLET BY MOUTH IN MORNING AND 1 TABLET BY MOUTH IN EVENING  . traZODone (DESYREL) 50 MG tablet Take 50 mg by mouth at bedtime.   No facility-administered encounter medications on file as of 01/06/2021.    Review of Systems  Constitutional: Negative for appetite change and unexpected weight change.  HENT: Negative for congestion and sinus pressure.   Respiratory: Negative for cough, chest tightness and shortness of breath.  Cardiovascular: Negative for chest pain and leg swelling.       Saw cardiology for palpitations.  Stable.   Gastrointestinal: Negative for abdominal pain, diarrhea, nausea and vomiting.  Genitourinary: Negative for difficulty urinating and dysuria.  Musculoskeletal: Negative for joint swelling and myalgias.  Skin: Negative for color change and rash.  Neurological: Negative for dizziness and light-headedness.       Headaches stable.  emgality helped.   Psychiatric/Behavioral: Negative for agitation.       Increased stress as outlined.         Objective:    Physical Exam Vitals reviewed.  Constitutional:      General: She is not in acute distress.    Appearance: Normal appearance.  HENT:     Head: Normocephalic and atraumatic.     Right Ear: External ear normal.     Left Ear: External ear normal.  Eyes:     General: No scleral icterus.       Right eye: No discharge.        Left eye: No discharge.     Conjunctiva/sclera: Conjunctivae normal.  Neck:     Thyroid: No thyromegaly.  Cardiovascular:     Rate and Rhythm: Normal rate and regular rhythm.  Pulmonary:     Effort: No respiratory distress.     Breath sounds: Normal breath sounds. No wheezing.  Abdominal:     General: Bowel sounds are normal.     Palpations: Abdomen is soft.      Tenderness: There is no abdominal tenderness.  Musculoskeletal:        General: No swelling or tenderness.     Cervical back: Neck supple. No tenderness.  Lymphadenopathy:     Cervical: No cervical adenopathy.  Skin:    Findings: No erythema or rash.  Neurological:     Mental Status: She is alert.  Psychiatric:        Mood and Affect: Mood normal.        Behavior: Behavior normal.     BP 128/74   Pulse 62   Temp (!) 96.5 F (35.8 C) (Temporal)   Resp 16   Ht 5\' 4"  (1.626 m)   Wt 119 lb 3.2 oz (54.1 kg)   SpO2 99%   BMI 20.46 kg/m  Wt Readings from Last 3 Encounters:  01/06/21 119 lb 3.2 oz (54.1 kg)  01/04/21 119 lb 8 oz (54.2 kg)  12/19/20 120 lb (54.4 kg)     Lab Results  Component Value Date   WBC 4.1 12/12/2020   HGB 11.9 (L) 12/12/2020   HCT 35.8 (L) 12/12/2020   PLT 133.0 (L) 12/12/2020   GLUCOSE 91 11/21/2020   CHOL 127 11/21/2020   TRIG 64.0 11/21/2020   HDL 59.20 11/21/2020   LDLCALC 55 11/21/2020   ALT 8 11/21/2020   AST 17 11/21/2020   NA 142 11/21/2020   K 3.9 11/21/2020   CL 107 11/21/2020   CREATININE 0.88 11/21/2020   BUN 19 11/21/2020   CO2 31 11/21/2020   TSH 3.11 05/12/2020   HGBA1C 5.5 01/15/2018    DG Abd 1 View  Result Date: 11/22/2020 CLINICAL DATA:  Lower back pain, renal calculi EXAM: ABDOMEN - 1 VIEW COMPARISON:  05/23/2020 FINDINGS: Supine frontal views of the abdomen and pelvis are obtained. There are 3 distinct calculi overlying the right renal silhouette, largest measuring 5 mm. No significant change since prior study. No evidence of left-sided calculi. Bowel gas pattern is unremarkable. No acute bony  abnormalities. Lung bases are clear. IMPRESSION: 1. Stable right renal calculi, largest measuring 8 mm. Electronically Signed   By: Randa Ngo M.D.   On: 11/22/2020 00:21       Assessment & Plan:   Problem List Items Addressed This Visit    Anemia    Follow cbc as outlined.       Anxiety    Continue cymbalta and  buspar as outlined.       Constipation    Tolerating integra.  Bowels stable.       GERD (gastroesophageal reflux disease)    No upper symptoms reported.  Continue aciphex.       Hematuria    Seeing urology.       Hypercholesterolemia    Continue crestor.  Low cholesterol diet and exercise. Follow lipid panel and liver function tests.        Leukopenia    Recent decreased white blood cell count and pancytopenia.  Recheck cbc recently stable/improved.  Continue to follow.       Migraine headache    Followed by neurology.  emgality has helped.  Stable.  Follow.       Mild depression ([place])    Discussed with her today.  Relationship ended recently.  Continues on trazodone, cymbalta and buspar.  Has xanax - has taken a few recently.  Discussed trying to limit amount.  Has been doing well off.  Follow.        Palpitations    Just saw cardiology 01/04/21.  Recommended if persistent problem, could place zio monitor.  Discussed with her today.  Feels - stable.  Continue propranolol.        Thrombocytopenia ([place])    12/12/20 - platelet count rechecked and improved - 133.  Discussed with hematology.  Recommended continuing to follow.           Einar Pheasant, MD

## 2021-01-08 ENCOUNTER — Encounter: Payer: Self-pay | Admitting: Internal Medicine

## 2021-01-08 NOTE — Assessment & Plan Note (Signed)
12/12/20 - platelet count rechecked and improved - 133.  Discussed with hematology.  Recommended continuing to follow.

## 2021-01-08 NOTE — Assessment & Plan Note (Signed)
Followed by neurology.  emgality has helped.  Stable.  Follow.

## 2021-01-08 NOTE — Assessment & Plan Note (Signed)
No upper symptoms reported.  Continue aciphex.  

## 2021-01-08 NOTE — Assessment & Plan Note (Signed)
Just saw cardiology 01/04/21.  Recommended if persistent problem, could place zio monitor.  Discussed with her today.  Feels - stable.  Continue propranolol.

## 2021-01-08 NOTE — Assessment & Plan Note (Signed)
Tolerating integra.  Bowels stable.

## 2021-01-08 NOTE — Assessment & Plan Note (Signed)
Continue cymbalta and buspar as outlined.

## 2021-01-08 NOTE — Assessment & Plan Note (Signed)
Seeing urology

## 2021-01-08 NOTE — Assessment & Plan Note (Signed)
Recent decreased white blood cell count and pancytopenia.  Recheck cbc recently stable/improved.  Continue to follow.

## 2021-01-08 NOTE — Assessment & Plan Note (Signed)
Follow cbc as outlined.

## 2021-01-08 NOTE — Assessment & Plan Note (Signed)
Continue crestor.  Low cholesterol diet and exercise. Follow lipid panel and liver function tests.   

## 2021-01-08 NOTE — Assessment & Plan Note (Signed)
Discussed with her today.  Relationship ended recently.  Continues on trazodone, cymbalta and buspar.  Has xanax - has taken a few recently.  Discussed trying to limit amount.  Has been doing well off.  Follow.

## 2021-01-11 ENCOUNTER — Telehealth: Payer: Self-pay

## 2021-01-11 MED ORDER — RABEPRAZOLE SODIUM 20 MG PO TBEC: 20.0000 mg | DELAYED_RELEASE_TABLET | Freq: Two times a day (BID) | ORAL | 1 refills | Status: DC

## 2021-01-11 NOTE — Telephone Encounter (Signed)
PT called in to inform that the Rx advise he that she needs PA for RABEprazole (ACIPHEX) 20 MG tablet to be filled.

## 2021-01-11 NOTE — Telephone Encounter (Signed)
I am ok to send in rx for aciphex.  If they are saying needs to be written as q day, ok to writ q day.  She will need to follow symptoms and let m know if develops any upper GI symptoms.

## 2021-01-11 NOTE — Telephone Encounter (Signed)
Called [facility] to confirm PA is needed. Pharmacy stated that PA is not required for 1 tablet q day but is required for BID. Pharmacist stated pt is agreeable to decrease to once daily. LM to confirm with pt. Are you ok with changing directions if pt is ok with it?

## 2021-01-11 NOTE — Telephone Encounter (Signed)
Pt called and states that she lost her bottle of RABEprazole (ACIPHEX) 20 MG tablet and needs a new rx called in to [facility] on [address] in [place]

## 2021-01-12 ENCOUNTER — Other Ambulatory Visit: Payer: Self-pay

## 2021-01-12 MED ORDER — RABEPRAZOLE SODIUM 20 MG PO TBEC: 20.0000 mg | DELAYED_RELEASE_TABLET | Freq: Every day | ORAL | 1 refills | Status: DC

## 2021-01-12 NOTE — Telephone Encounter (Signed)
Spoke with patient. She is agreeable to do once daily and will let us know if any upper GI symptoms.

## 2021-01-12 NOTE — Telephone Encounter (Signed)
Sent in new rx with new directions.

## 2021-01-16 ENCOUNTER — Ambulatory Visit: Payer: Self-pay | Admitting: Urology

## 2021-01-18 DIAGNOSIS — F3341 Major depressive disorder, recurrent, in partial remission: Secondary | ICD-10-CM | POA: Diagnosis not present

## 2021-01-18 DIAGNOSIS — F411 Generalized anxiety disorder: Secondary | ICD-10-CM | POA: Diagnosis not present

## 2021-01-18 DIAGNOSIS — F410 Panic disorder [episodic paroxysmal anxiety] without agoraphobia: Secondary | ICD-10-CM | POA: Diagnosis not present

## 2021-01-18 DIAGNOSIS — G4700 Insomnia, unspecified: Secondary | ICD-10-CM | POA: Diagnosis not present

## 2021-02-01 DIAGNOSIS — G4723 Circadian rhythm sleep disorder, irregular sleep wake type: Secondary | ICD-10-CM | POA: Diagnosis not present

## 2021-02-01 DIAGNOSIS — J3089 Other allergic rhinitis: Secondary | ICD-10-CM | POA: Diagnosis not present

## 2021-02-01 DIAGNOSIS — G4701 Insomnia due to medical condition: Secondary | ICD-10-CM | POA: Diagnosis not present

## 2021-02-01 DIAGNOSIS — G43719 Chronic migraine without aura, intractable, without status migrainosus: Secondary | ICD-10-CM | POA: Diagnosis not present

## 2021-02-07 ENCOUNTER — Other Ambulatory Visit: Payer: Self-pay | Admitting: Internal Medicine

## 2021-02-16 ENCOUNTER — Ambulatory Visit: Payer: Medicare Other | Admitting: Urology

## 2021-03-01 NOTE — Progress Notes (Signed)
11/21/2020 8:36 AM   Brandi Ramos 1946-04-13 [id number]  Referring provider: Einar Ramos, [address] [place],  [place] [address] Chief Complaint  Patient presents with   Nephrolithiasis    Urological history: 1. High risk hematuria - Non-smoker - CTU 01/2018 Nonobstructive right nephrolithiasis includes a 3 mm upper pole calculus on image 73/5; an 8 mm in long axis lower pole calculus on image 63/5; and a 4 mm lower pole calculus on image 66/5.  Left-sided renal calculi include a 1-2 mm upper pole nonobstructive calculus and a 1-2 mm lower pole nonobstructive calculus. There is also faint calcification along the left renal capsule on image 60/5 medially.  Some cortical hypodensity in thinning in the left mid upper kidney adjacent to the spleen. Given the slightly scalloped contour I favor that this is probably from remote prior scarring, and a similar appearance was present on 11/16/2015 - Cysto 02/2018 with Dr. Erlene Ramos was negative -Contrast CT in 10/2018 Nonobstructing calculus in the lower pole of the right kidney measuring up to 7 mm on image 47/4. There are probable additional tiny renal calculi -Cystoscopy with Dr. Bernardo Ramos in 11/2019 was NED -no reports of gross heme - UA negative for micro heme  2. Nephrolithiasis - Contrast CT in 10/2018 noted there is a nonobstructing calculus in the lower pole of the right kidney measuring up to 7 mm on image 47/4. There are probable additional tiny renal calculi - Contrast CT in 10/2020 7 mm stone noted within the inferior pole of the right kidney. - KUB 11/21/2020 stable right renal calculi 8 mm   HPI: Brandi Ramos is a 75 y.o. female who presents today for a 6 month follow up.    She did not find the Myrbetriq helpful in controlling her urinary frequency.  She does not find her urinary symptoms bothersome at this time.   Patient denies any modifying or aggravating factors.  Patient denies any gross  hematuria, dysuria or suprapubic/flank pain.  Patient denies any fevers, chills, nausea or vomiting.    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     Surgical History: Past Surgical History:  Procedure Laterality Date   ABDOMINAL HYSTERECTOMY  1984   COLONOSCOPY     COLONOSCOPY WITH PROPOFOL N/A 09/26/2018   Procedure: COLONOSCOPY WITH PROPOFOL;  Surgeon: Lollie Sails, MD;  Location[facility] ENDOSCOPY;  Service: Endoscopy;  Laterality: N/A;   ESOPHAGOGASTRODUODENOSCOPY (EGD) WITH PROPOFOL N/A 10/31/2015   Procedure: ESOPHAGOGASTRODUODENOSCOPY (EGD) WITH PROPOFOL;  Surgeon: Lollie Sails, MD;  Location: [facility] ENDOSCOPY;  Service: Endoscopy;  Laterality: N/A;   ESOPHAGOGASTRODUODENOSCOPY (EGD) WITH PROPOFOL N/A 09/26/2018   Procedure: ESOPHAGOGASTRODUODENOSCOPY (EGD) WITH PROPOFOL;  Surgeon: Lollie Sails, MD;  Location: [facility] ENDOSCOPY;  Service: Endoscopy;  Laterality: N/A;   LAPAROSCOPIC APPENDECTOMY N/A 10/29/2018   Procedure:  APPENDECTOMY LAPAROSCOPIC;  Surgeon: Herbert Pun, MD;  Location: [facility] ORS;  Service: General;  Laterality: N/A;   LAPAROSCOPIC BILATERAL SALPINGO OOPHERECTOMY Bilateral 10/29/2018   Procedure: LAPAROSCOPIC BILATERAL SALPINGO OOPHORECTOMY;  Surgeon: Ward, Honor Loh, MD;  Location: [facility] ORS;  Service: Gynecology;  Laterality: Bilateral;    Home Medications:  Allergies as of 03/02/2021       Reactions   Amitiza [lubiprostone] Other (See Comments)    Sulfa Antibiotics         Medication List        Accurate as of March 02, 2021 11:59 PM. If you have any questions, ask your nurse or doctor.          ALPRAZolam 0.25 MG tablet Commonly known as: XANAX TAKE 1 TABLET BY MOUTH EVERY DAY AS NEEDED   busPIRone 10 MG tablet Commonly known as: BUSPAR Take 1 tablet (10 mg total) by mouth daily.   DULoxetine 60 MG capsule Commonly known as: CYMBALTA Take 1 capsule (60 mg total) by mouth daily.   Emgality 120 MG/ML Soaj Generic drug: Galcanezumab-gnlm every 30 (thirty) days.   fluticasone 50 MCG/ACT nasal spray Commonly known as: FLONASE SHAKE LIQUID AND USE 2 SPRAYS IN EACH NOSTRIL DAILY   Integra 62.5-62.5-40-3 MG Caps TAKE 1 CAPSULE BY MOUTH DAILY   loratadine 10 MG tablet Commonly known as: CLARITIN Take 10 mg by mouth daily.   meloxicam 15 MG tablet Commonly known as: MOBIC TAKE 1 TABLET BY MOUTH ONCE DAILY WITH A MEAL   ondansetron 4 MG disintegrating tablet Commonly known as: Zofran ODT Take 1 tablet (4 mg total) by mouth 2 (two) times daily as needed for nausea or vomiting.   propranolol ER 60 MG 24 hr capsule Commonly known as: INDERAL LA TK 1 C PO D   RABEprazole 20 MG tablet Commonly known as: ACIPHEX Take 1 tablet (20 mg total) by mouth daily.   rizatriptan 10 MG tablet Commonly known as: MAXALT Take 10 mg by mouth as needed. May repeat in 2 hours if needed   rosuvastatin 5 MG tablet Commonly known as: Crestor Take one tablet q Monday, Wednesday and Friday.   sucralfate 1 g tablet Commonly known as: Carafate Take 1 tablet (1 g total) by mouth 2 (two) times daily as needed.   SUMAtriptan 100 MG tablet Commonly known as: IMITREX   topiramate 50 MG tablet Commonly known as: TOPAMAX TAKE 1 AND 1/2 TABLET BY MOUTH IN MORNING AND 1 TABLET BY MOUTH IN EVENING   traZODone 50 MG tablet Commonly known as: DESYREL Take 50 mg by mouth at bedtime.        Allergies:  Allergies  Allergen  Reactions   Amitiza [Lubiprostone] Other (See Comments)   Sulfa Antibiotics     Family History: Family History  Problem Relation Age of Onset   CAD Mother        s/p CABG   Hypercholesterolemia Mother    Breast cancer Mother 70   Heart disease Mother    Lung cancer Mother    Hypertension Father    Migraines Father    Hypercholesterolemia Father    Lung cancer Father    Migraines Sister    Arthritis Sister    Lymphoma Other        grandmother   Colon cancer Neg Hx     Social History:  reports that she has never smoked. She has never used smokeless tobacco. She reports current alcohol use. She reports that she does not use  drugs.   Physical Exam: BP (!) 120/51   Pulse 76   Ht 5\' 4"  (1.626 m)   Wt 119 lb (54 kg)   BMI 20.43 kg/m   Constitutional:  Well nourished. Alert and oriented, No acute distress. HEENT: [place] AT, mask in place.  Trachea midline Cardiovascular: No clubbing, cyanosis, or edema. Respiratory: Normal respiratory effort, no increased work of breathing. Neurologic: Grossly intact, no focal deficits, moving all 4 extremities. Psychiatric: Normal mood and affect.    Laboratory Data: Component     Latest Ref Rng & Units 03/02/2021  Specific Gravity, UA     1.005 - 1.030 1.020  pH, UA     5.0 - 7.5 7.0  Color, UA     Yellow Yellow  Appearance Ur     Clear Cloudy (A)  Leukocytes,UA     Negative Negative  Protein,UA     Negative/Trace Negative  Glucose, UA     Negative Negative  Ketones, UA     Negative Negative  RBC, UA     Negative Trace (A)  Bilirubin, UA     Negative Negative  Urobilinogen, Ur     0.2 - 1.0 mg/dL 0.2  Nitrite, UA     Negative Negative  Microscopic Examination      See below:   Component     Latest Ref Rng & Units 03/02/2021  WBC, UA     0 - 5 /hpf 0-5  RBC     0 - 2 /hpf 0-2  Epithelial Cells (non renal)     0 - 10 /hpf 0-10  Casts     None seen /lpf Present (A)  Cast Type     N/A Hyaline casts  Bacteria, UA      None seen/Few None seen   I have reviewed the labs.  Pertinent Imaging: Narrative & Impression  CLINICAL DATA:  Kidney stones   EXAM: ABDOMEN - 1 VIEW   COMPARISON:  11/21/2020   FINDINGS: There is a 6 mm calculus overlying the lower pole the right kidney. No additional calculi are visible, noting overlying bowel gas. The bowel gas pattern is unremarkable.   IMPRESSION: Likely unchanged right lower pole renal calculus.     Electronically Signed   By: Macy Mis M.D.   On: 03/03/2021 13:51    I have independently reviewed the films.  See HPI.      Assessment & Plan:    1. OAB -symptoms are mild -she is not wanting any further intervention at this time  2. Right renal stone -asymptomatic -continue to monitor  3. High risk hematuria -work up in 2019 NED -no reports of gross heme -no micro heme on UA   Return in about 1 year (around 03/02/2022) for KUB and UA .    Zara Council, PA-C [facility] [address], [place], [place] [address] [phone]

## 2021-03-02 ENCOUNTER — Encounter: Payer: Self-pay | Admitting: Urology

## 2021-03-02 ENCOUNTER — Ambulatory Visit
Admission: RE | Admit: 2021-03-02 | Discharge: 2021-03-02 | Disposition: A | Discharge status: Home / Self Care | Payer: Medicare Other | Source: Ambulatory Visit | Attending: Urology | Admitting: Urology

## 2021-03-02 ENCOUNTER — Other Ambulatory Visit: Payer: Self-pay

## 2021-03-02 ENCOUNTER — Ambulatory Visit
Admission: RE | Admit: 2021-03-02 | Discharge: 2021-03-02 | Disposition: A | Discharge status: Home / Self Care | Payer: Medicare Other | Attending: Urology | Admitting: Urology

## 2021-03-02 ENCOUNTER — Ambulatory Visit: Payer: Medicare Other | Admitting: Urology

## 2021-03-02 VITALS — BP 120/51 | HR 76 | Ht 64.0 in | Wt 119.0 lb

## 2021-03-02 DIAGNOSIS — N200 Calculus of kidney: Secondary | ICD-10-CM | POA: Diagnosis not present

## 2021-03-02 DIAGNOSIS — R3915 Urgency of urination: Secondary | ICD-10-CM | POA: Diagnosis not present

## 2021-03-02 DIAGNOSIS — R319 Hematuria, unspecified: Secondary | ICD-10-CM

## 2021-03-02 LAB — MICROSCOPIC EXAMINATION: Bacteria, UA: NONE SEEN

## 2021-03-02 LAB — URINALYSIS, COMPLETE
Bilirubin, UA: NEGATIVE
Glucose, UA: NEGATIVE
Ketones, UA: NEGATIVE
Leukocytes,UA: NEGATIVE
Nitrite, UA: NEGATIVE
Protein,UA: NEGATIVE
Specific Gravity, UA: 1.02 (ref 1.005–1.030)
Urobilinogen, Ur: 0.2 mg/dL (ref 0.2–1.0)
pH, UA: 7 (ref 5.0–7.5)

## 2021-03-10 ENCOUNTER — Ambulatory Visit (INDEPENDENT_AMBULATORY_CARE_PROVIDER_SITE_OTHER): Payer: Medicare Other | Admitting: Internal Medicine

## 2021-03-10 ENCOUNTER — Other Ambulatory Visit: Payer: Self-pay

## 2021-03-10 ENCOUNTER — Encounter: Payer: Self-pay | Admitting: Internal Medicine

## 2021-03-10 DIAGNOSIS — K219 Gastro-esophageal reflux disease without esophagitis: Secondary | ICD-10-CM | POA: Diagnosis not present

## 2021-03-10 DIAGNOSIS — D72819 Decreased white blood cell count, unspecified: Secondary | ICD-10-CM

## 2021-03-10 DIAGNOSIS — E7800 Pure hypercholesterolemia, unspecified: Secondary | ICD-10-CM

## 2021-03-10 DIAGNOSIS — F320 Major depressive disorder, single episode, mild: Secondary | ICD-10-CM

## 2021-03-10 DIAGNOSIS — F32A Depression, unspecified: Secondary | ICD-10-CM

## 2021-03-10 DIAGNOSIS — G43709 Chronic migraine without aura, not intractable, without status migrainosus: Secondary | ICD-10-CM | POA: Diagnosis not present

## 2021-03-10 DIAGNOSIS — K5900 Constipation, unspecified: Secondary | ICD-10-CM

## 2021-03-10 DIAGNOSIS — R319 Hematuria, unspecified: Secondary | ICD-10-CM

## 2021-03-10 DIAGNOSIS — R002 Palpitations: Secondary | ICD-10-CM

## 2021-03-10 DIAGNOSIS — D649 Anemia, unspecified: Secondary | ICD-10-CM

## 2021-03-10 DIAGNOSIS — D696 Thrombocytopenia, unspecified: Secondary | ICD-10-CM | POA: Diagnosis not present

## 2021-03-10 MED ORDER — MAGNESIUM OXIDE -MG SUPPLEMENT 400 (240 MG) MG PO TABS: 400.0000 mg | ORAL_TABLET | Freq: Every day | ORAL | 2 refills | Status: DC

## 2021-03-10 NOTE — Progress Notes (Signed)
Patient ID: Brandi Ramos, female   DOB: [birth year], 75 y.o.   MRN: [id number]   Subjective:    Patient ID: Brandi Ramos, female    DOB: Sep 29, 1945, 75 y.o.   MRN: [id number]  HPI This visit occurred during the SARS-CoV-2 public health emergency.  Safety protocols were in place, including screening questions prior to the visit, additional usage of staff PPE, and extensive cleaning of exam room while observing appropriate contact time as indicated for disinfecting solutions.   Patient here for a scheduled follow up.  Here to follow up regarding increased stress and cholesterol.  She was recently evaluated by urology for follow-up of kidney stone and overactive bladder.  She desired no further intervention for her bladder.  She is currently asymptomatic.  Recommended follow-up in 1 year with KUB and urinalysis.  She also had follow-up with [facility].  Recommend to continue on propanolol.  On Topamax.  Recommended restarting Emgality.  Also recommended continuing trazodone for sleep.  She is still having headaches.  The abortive medication works well for her.  Breathing is stable.  She feels her stress is better.  She feels she is handling things relatively well.  Does not feel she needs any further intervention.  Has good family support.  Stays physically active.  No chest pain or tightness with increased activity or exertion.  She does report persistent problems with palpitations.  She request follow-up with Dr. Rockey Situ.  She is going to call and make the appointment.  No acid reflux reported.  No abdominal pain.  Still persistent problems with constipation.   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    Past Surgical History:  Procedure Laterality Date   ABDOMINAL HYSTERECTOMY  1984   COLONOSCOPY     COLONOSCOPY WITH PROPOFOL N/A 09/26/2018   Procedure: COLONOSCOPY WITH PROPOFOL;  Surgeon: Lollie Sails, MD;  Location: [facility] ENDOSCOPY;  Service: Endoscopy;  Laterality: N/A;   ESOPHAGOGASTRODUODENOSCOPY (EGD) WITH PROPOFOL N/A 10/31/2015   Procedure: ESOPHAGOGASTRODUODENOSCOPY (EGD) WITH PROPOFOL;  Surgeon: Lollie Sails, MD;  Location: [facility] ENDOSCOPY;  Service: Endoscopy;  Laterality: N/A;   ESOPHAGOGASTRODUODENOSCOPY (EGD) WITH PROPOFOL N/A 09/26/2018   Procedure: ESOPHAGOGASTRODUODENOSCOPY (EGD) WITH PROPOFOL;  Surgeon: Lollie Sails, MD;  Location: [facility] ENDOSCOPY;  Service: Endoscopy;  Laterality: N/A;   LAPAROSCOPIC APPENDECTOMY N/A 10/29/2018   Procedure: APPENDECTOMY LAPAROSCOPIC;  Surgeon: Herbert Pun, MD;  Location: [facility] ORS;  Service: General;  Laterality: N/A;   LAPAROSCOPIC BILATERAL SALPINGO OOPHERECTOMY Bilateral 10/29/2018   Procedure: LAPAROSCOPIC BILATERAL SALPINGO OOPHORECTOMY;  Surgeon: Ward, Honor Loh, MD;  Location: [facility] ORS;  Service: Gynecology;  Laterality: Bilateral;   Family History  Problem Relation Age of Onset   CAD Mother        s/p CABG   Hypercholesterolemia Mother    Breast cancer Mother 54  Heart disease Mother    Lung cancer Mother    Hypertension Father    Migraines Father    Hypercholesterolemia Father    Lung cancer Father    Migraines Sister    Arthritis Sister    Lymphoma Other        grandmother   Colon cancer Neg Hx    Social History   Socioeconomic History   Marital status: Widowed    Spouse name: Not on file   Number of children: 2   Years of education: Not on file   Highest education level: Not on  file  Occupational History   Not on file  Tobacco Use   Smoking status: Never   Smokeless tobacco: Never  Vaping Use   Vaping Use: Never used  Substance and Sexual Activity   Alcohol use: Yes    Alcohol/week: 0.0 standard drinks    Comment: wine occassionally   Drug use: No   Sexual activity: Not Currently  Other Topics Concern   Not on file  Social History Narrative   Not on file   Social Determinants of Health   Financial Resource Strain: Low Risk    Difficulty of Paying Living Expenses: Not hard at all  Food Insecurity: No Food Insecurity   Worried About Charity fundraiser in the Last Year: Never true   Hunt in the Last Year: Never true  Transportation Needs: No Transportation Needs   Lack of Transportation (Medical): No   Lack of Transportation (Non-Medical): No  Physical Activity: Not on file  Stress: Not on file  Social Connections: Moderately Integrated   Frequency of Communication with Friends and Family: More than three times a week   Frequency of Social Gatherings with Friends and Family: More than three times a week   Attends Religious Services: More than 4 times per year   Active Member of [employer] or Organizations: Yes   Attends Archivist Meetings: More than 4 times per year   Marital Status: Widowed    Outpatient Encounter Medications as of 03/10/2021  Medication Sig   ALPRAZolam (XANAX) 0.25 MG tablet TAKE 1 TABLET BY MOUTH EVERY DAY AS NEEDED   busPIRone (BUSPAR) 10 MG tablet Take 1 tablet (10 mg total) by mouth daily.   DULoxetine (CYMBALTA) 60 MG capsule Take 1 capsule (60 mg total) by mouth daily.   EMGALITY 120 MG/ML SOAJ every 30 (thirty) days.   Fe Fum-FePoly-Vit C-Vit B3 (INTEGRA) 62.5-62.5-40-3 MG CAPS TAKE 1 CAPSULE BY MOUTH DAILY   fluticasone (FLONASE) 50 MCG/ACT nasal spray SHAKE LIQUID AND USE 2 SPRAYS IN EACH NOSTRIL DAILY   loratadine (CLARITIN) 10 MG tablet Take 10 mg by mouth daily.   magnesium oxide (MAG-OX) 400 (240  Mg) MG tablet Take 1 tablet (400 mg total) by mouth daily.   meloxicam (MOBIC) 15 MG tablet TAKE 1 TABLET BY MOUTH ONCE DAILY WITH A MEAL   ondansetron (ZOFRAN ODT) 4 MG disintegrating tablet Take 1 tablet (4 mg total) by mouth 2 (two) times daily as needed for nausea or vomiting.   propranolol ER (INDERAL LA) 60 MG 24 hr capsule TK 1 C PO D   RABEprazole (ACIPHEX) 20 MG tablet Take 1 tablet (20 mg total) by mouth daily.   rizatriptan (MAXALT) 10 MG tablet Take 10 mg by mouth as needed. May repeat in 2 hours if needed   rosuvastatin (CRESTOR) 5 MG tablet Take one tablet q Monday, Wednesday and Friday.   sucralfate (  CARAFATE) 1 g tablet Take 1 tablet (1 g total) by mouth 2 (two) times daily as needed.   SUMAtriptan (IMITREX) 100 MG tablet    topiramate (TOPAMAX) 50 MG tablet TAKE 1 AND 1/2 TABLET BY MOUTH IN MORNING AND 1 TABLET BY MOUTH IN EVENING   traZODone (DESYREL) 50 MG tablet Take 50 mg by mouth at bedtime.   No facility-administered encounter medications on file as of 03/10/2021.     Review of Systems  Constitutional:  Negative for appetite change and unexpected weight change.  HENT:  Negative for congestion and sinus pressure.   Respiratory:  Negative for cough, chest tightness and shortness of breath.   Cardiovascular:  Positive for palpitations. Negative for chest pain and leg swelling.  Gastrointestinal:  Positive for constipation. Negative for abdominal pain, diarrhea, nausea and vomiting.  Genitourinary:  Negative for difficulty urinating and dysuria.  Musculoskeletal:  Negative for joint swelling and myalgias.  Skin:  Negative for color change and rash.  Neurological:  Positive for headaches. Negative for dizziness and light-headedness.  Psychiatric/Behavioral:  Negative for agitation and dysphoric mood.        Increased stress as outlined.        Objective:    Physical Exam Vitals reviewed.  Constitutional:      General: She is not in acute distress.    Appearance:  Normal appearance.  HENT:     Head: Normocephalic and atraumatic.     Right Ear: External ear normal.     Left Ear: External ear normal.  Eyes:     General: No scleral icterus.       Right eye: No discharge.        Left eye: No discharge.     Conjunctiva/sclera: Conjunctivae normal.  Neck:     Thyroid: No thyromegaly.  Cardiovascular:     Rate and Rhythm: Normal rate and regular rhythm.  Pulmonary:     Effort: No respiratory distress.     Breath sounds: Normal breath sounds. No wheezing.  Abdominal:     General: Bowel sounds are normal.     Palpations: Abdomen is soft.     Tenderness: There is no abdominal tenderness.  Musculoskeletal:        General: No swelling or tenderness.     Cervical back: Neck supple. No tenderness.  Lymphadenopathy:     Cervical: No cervical adenopathy.  Skin:    Findings: No erythema or rash.  Neurological:     Mental Status: She is alert.  Psychiatric:        Mood and Affect: Mood normal.        Behavior: Behavior normal.    BP 128/70   Pulse 66   Temp 97.8 F (36.6 C)   Ht 5' 4.02" (1.626 m)   Wt 118 lb 3.2 oz (53.6 kg)   SpO2 96%   BMI 20.28 kg/m  Wt Readings from Last 3 Encounters:  03/10/21 118 lb 3.2 oz (53.6 kg)  03/02/21 119 lb (54 kg)  01/06/21 119 lb 3.2 oz (54.1 kg)    Outpatient Encounter Medications as of 03/10/2021  Medication Sig   ALPRAZolam (XANAX) 0.25 MG tablet TAKE 1 TABLET BY MOUTH EVERY DAY AS NEEDED   busPIRone (BUSPAR) 10 MG tablet Take 1 tablet (10 mg total) by mouth daily.   DULoxetine (CYMBALTA) 60 MG capsule Take 1 capsule (60 mg total) by mouth daily.   EMGALITY 120 MG/ML SOAJ every 30 (thirty) days.   Fe Fum-FePoly-Vit C-Vit B3 (  INTEGRA) 62.5-62.5-40-3 MG CAPS TAKE 1 CAPSULE BY MOUTH DAILY   fluticasone (FLONASE) 50 MCG/ACT nasal spray SHAKE LIQUID AND USE 2 SPRAYS IN EACH NOSTRIL DAILY   loratadine (CLARITIN) 10 MG tablet Take 10 mg by mouth daily.   magnesium oxide (MAG-OX) 400 (240 Mg) MG tablet Take  1 tablet (400 mg total) by mouth daily.   meloxicam (MOBIC) 15 MG tablet TAKE 1 TABLET BY MOUTH ONCE DAILY WITH A MEAL   ondansetron (ZOFRAN ODT) 4 MG disintegrating tablet Take 1 tablet (4 mg total) by mouth 2 (two) times daily as needed for nausea or vomiting.   propranolol ER (INDERAL LA) 60 MG 24 hr capsule TK 1 C PO D   RABEprazole (ACIPHEX) 20 MG tablet Take 1 tablet (20 mg total) by mouth daily.   rizatriptan (MAXALT) 10 MG tablet Take 10 mg by mouth as needed. May repeat in 2 hours if needed   rosuvastatin (CRESTOR) 5 MG tablet Take one tablet q Monday, Wednesday and Friday.   sucralfate (CARAFATE) 1 g tablet Take 1 tablet (1 g total) by mouth 2 (two) times daily as needed.   SUMAtriptan (IMITREX) 100 MG tablet    topiramate (TOPAMAX) 50 MG tablet TAKE 1 AND 1/2 TABLET BY MOUTH IN MORNING AND 1 TABLET BY MOUTH IN EVENING   traZODone (DESYREL) 50 MG tablet Take 50 mg by mouth at bedtime.   No facility-administered encounter medications on file as of 03/10/2021.     Lab Results  Component Value Date   WBC 4.1 12/12/2020   HGB 11.9 (L) 12/12/2020   HCT 35.8 (L) 12/12/2020   PLT 133.0 (L) 12/12/2020   GLUCOSE 91 11/21/2020   CHOL 127 11/21/2020   TRIG 64.0 11/21/2020   HDL 59.20 11/21/2020   LDLCALC 55 11/21/2020   ALT 8 11/21/2020   AST 17 11/21/2020   NA 142 11/21/2020   K 3.9 11/21/2020   CL 107 11/21/2020   CREATININE 0.88 11/21/2020   BUN 19 11/21/2020   CO2 31 11/21/2020   TSH 3.11 05/12/2020   HGBA1C 5.5 01/15/2018    DG Abd 1 View  Result Date: 03/03/2021 CLINICAL DATA:  Kidney stones EXAM: ABDOMEN - 1 VIEW COMPARISON:  11/21/2020 FINDINGS: There is a 6 mm calculus overlying the lower pole the right kidney. No additional calculi are visible, noting overlying bowel gas. The bowel gas pattern is unremarkable. IMPRESSION: Likely unchanged right lower pole renal calculus. Electronically Signed   By: Macy Mis M.D.   On: 03/03/2021 13:51       Assessment & Plan:    Problem List Items Addressed This Visit     Anemia    Follow CBC as outlined.       Constipation    Still some issues with constipation.  Does appear to be some improved.  Dulcolax if needed.  Follow.  Has seen GI.       GERD (gastroesophageal reflux disease)    No upper symptoms reported.  Continue Aciphex.       Hematuria    Being followed by urology as outlined.  Being followed for kidney stone.  They recommended follow-up in 1 year with KUB and urinalysis.       Hypercholesterolemia    Continue Crestor.  Low-cholesterol diet and exercise.  Follow lipid panel liver function test.       Leukopenia    Has had persistent decreased white blood cell count/pancytopenia.  Discussed with hematology.  They recommended continuing to follow.  Migraine headache    Followed by neurology as outlined.  Emgality has helped.  She continues on Topamax.  Abortive medication helps.  Follow-up.       Mild depression ([place])    Discussed with her today.  She continues on trazodone, Cymbalta and BuSpar.  Overall she feels she is doing better.  Follow.       Palpitations    Saw cardiology in April 2022.  Recommended if persistent problem they can place a ZIO monitor.  She reports still noticing some intermittent palpitations.  She does desire follow-up with cardiology.  She prefers to call make the appointment.       Thrombocytopenia ([place])    Persistent issues with low platelet count.  Last platelet count in April had improved to 133.  Discussed with hematology.  Recommend continuing to follow.         Einar Pheasant, MD

## 2021-03-12 ENCOUNTER — Encounter: Payer: Self-pay | Admitting: Internal Medicine

## 2021-03-12 NOTE — Assessment & Plan Note (Signed)
Followed by neurology as outlined.  Emgality has helped.  She continues on Topamax.  Abortive medication helps.  Follow-up.

## 2021-03-12 NOTE — Assessment & Plan Note (Signed)
Persistent issues with low platelet count.  Last platelet count in April had improved to 133.  Discussed with hematology.  Recommend continuing to follow.

## 2021-03-12 NOTE — Assessment & Plan Note (Signed)
Still some issues with constipation.  Does appear to be some improved.  Dulcolax if needed.  Follow.  Has seen GI.

## 2021-03-12 NOTE — Assessment & Plan Note (Signed)
Has had persistent decreased white blood cell count/pancytopenia.  Discussed with hematology.  They recommended continuing to follow. 

## 2021-03-12 NOTE — Assessment & Plan Note (Signed)
Discussed with her today.  She continues on trazodone, Cymbalta and BuSpar.  Overall she feels she is doing better.  Follow.

## 2021-03-12 NOTE — Assessment & Plan Note (Signed)
Follow CBC as outlined.

## 2021-03-12 NOTE — Assessment & Plan Note (Signed)
Continue Crestor.  Low-cholesterol diet and exercise.  Follow lipid panel liver function test. 

## 2021-03-12 NOTE — Assessment & Plan Note (Signed)
No upper symptoms reported.  Continue Aciphex.

## 2021-03-12 NOTE — Assessment & Plan Note (Signed)
Being followed by urology as outlined.  Being followed for kidney stone.  They recommended follow-up in 1 year with KUB and urinalysis. 

## 2021-03-12 NOTE — Assessment & Plan Note (Addendum)
Saw cardiology in April 2022.  Recommended if persistent problem they can place a ZIO monitor.  She reports still noticing some intermittent palpitations.  She does desire follow-up with cardiology.  She prefers to call make the appointment.

## 2021-03-22 DIAGNOSIS — D2262 Melanocytic nevi of left upper limb, including shoulder: Secondary | ICD-10-CM | POA: Diagnosis not present

## 2021-03-22 DIAGNOSIS — D2261 Melanocytic nevi of right upper limb, including shoulder: Secondary | ICD-10-CM | POA: Diagnosis not present

## 2021-03-22 DIAGNOSIS — D225 Melanocytic nevi of trunk: Secondary | ICD-10-CM | POA: Diagnosis not present

## 2021-03-22 DIAGNOSIS — D2271 Melanocytic nevi of right lower limb, including hip: Secondary | ICD-10-CM | POA: Diagnosis not present

## 2021-04-07 DIAGNOSIS — H1013 Acute atopic conjunctivitis, bilateral: Secondary | ICD-10-CM | POA: Diagnosis not present

## 2021-04-10 ENCOUNTER — Other Ambulatory Visit: Payer: Self-pay | Admitting: Internal Medicine

## 2021-04-19 DIAGNOSIS — F3341 Major depressive disorder, recurrent, in partial remission: Secondary | ICD-10-CM | POA: Diagnosis not present

## 2021-04-19 DIAGNOSIS — F411 Generalized anxiety disorder: Secondary | ICD-10-CM | POA: Diagnosis not present

## 2021-04-19 DIAGNOSIS — F410 Panic disorder [episodic paroxysmal anxiety] without agoraphobia: Secondary | ICD-10-CM | POA: Diagnosis not present

## 2021-04-19 DIAGNOSIS — G4700 Insomnia, unspecified: Secondary | ICD-10-CM | POA: Diagnosis not present

## 2021-04-27 ENCOUNTER — Ambulatory Visit: Payer: Medicare Other

## 2021-05-14 ENCOUNTER — Other Ambulatory Visit: Payer: Self-pay | Admitting: Internal Medicine

## 2021-05-23 DIAGNOSIS — M898X1 Other specified disorders of bone, shoulder: Secondary | ICD-10-CM | POA: Diagnosis not present

## 2021-05-29 ENCOUNTER — Other Ambulatory Visit: Payer: Self-pay | Admitting: Internal Medicine

## 2021-05-29 DIAGNOSIS — F3341 Major depressive disorder, recurrent, in partial remission: Secondary | ICD-10-CM | POA: Diagnosis not present

## 2021-05-29 DIAGNOSIS — F411 Generalized anxiety disorder: Secondary | ICD-10-CM | POA: Diagnosis not present

## 2021-05-29 DIAGNOSIS — Z1231 Encounter for screening mammogram for malignant neoplasm of breast: Secondary | ICD-10-CM

## 2021-05-29 DIAGNOSIS — F410 Panic disorder [episodic paroxysmal anxiety] without agoraphobia: Secondary | ICD-10-CM | POA: Diagnosis not present

## 2021-06-07 ENCOUNTER — Ambulatory Visit
Admission: RE | Admit: 2021-06-07 | Discharge: 2021-06-07 | Disposition: A | Discharge status: Home / Self Care | Payer: Medicare Other | Source: Ambulatory Visit | Attending: Internal Medicine | Admitting: Internal Medicine

## 2021-06-07 ENCOUNTER — Other Ambulatory Visit: Payer: Self-pay

## 2021-06-07 DIAGNOSIS — Z1231 Encounter for screening mammogram for malignant neoplasm of breast: Secondary | ICD-10-CM | POA: Insufficient documentation

## 2021-06-12 ENCOUNTER — Other Ambulatory Visit: Payer: Self-pay

## 2021-06-12 ENCOUNTER — Ambulatory Visit (INDEPENDENT_AMBULATORY_CARE_PROVIDER_SITE_OTHER): Payer: Medicare Other | Admitting: Internal Medicine

## 2021-06-12 ENCOUNTER — Encounter: Payer: Self-pay | Admitting: Internal Medicine

## 2021-06-12 VITALS — BP 122/76 | HR 66 | Temp 95.7°F | Ht 64.02 in | Wt 120.2 lb

## 2021-06-12 DIAGNOSIS — E7800 Pure hypercholesterolemia, unspecified: Secondary | ICD-10-CM | POA: Diagnosis not present

## 2021-06-12 DIAGNOSIS — D649 Anemia, unspecified: Secondary | ICD-10-CM | POA: Diagnosis not present

## 2021-06-12 DIAGNOSIS — D72819 Decreased white blood cell count, unspecified: Secondary | ICD-10-CM

## 2021-06-12 DIAGNOSIS — F32A Depression, unspecified: Secondary | ICD-10-CM

## 2021-06-12 DIAGNOSIS — R319 Hematuria, unspecified: Secondary | ICD-10-CM

## 2021-06-12 DIAGNOSIS — K219 Gastro-esophageal reflux disease without esophagitis: Secondary | ICD-10-CM

## 2021-06-12 DIAGNOSIS — K5900 Constipation, unspecified: Secondary | ICD-10-CM

## 2021-06-12 DIAGNOSIS — R002 Palpitations: Secondary | ICD-10-CM

## 2021-06-12 DIAGNOSIS — G43709 Chronic migraine without aura, not intractable, without status migrainosus: Secondary | ICD-10-CM | POA: Diagnosis not present

## 2021-06-12 DIAGNOSIS — Z0000 Encounter for general adult medical examination without abnormal findings: Secondary | ICD-10-CM

## 2021-06-12 DIAGNOSIS — D696 Thrombocytopenia, unspecified: Secondary | ICD-10-CM

## 2021-06-12 LAB — CBC WITH DIFFERENTIAL/PLATELET
Basophils Absolute: 0 10*3/uL (ref 0.0–0.1)
Basophils Relative: 0.6 % (ref 0.0–3.0)
Eosinophils Absolute: 0.2 10*3/uL (ref 0.0–0.7)
Eosinophils Relative: 5.7 % — ABNORMAL HIGH (ref 0.0–5.0)
HCT: 35.7 % — ABNORMAL LOW (ref 36.0–46.0)
Hemoglobin: 11.8 g/dL — ABNORMAL LOW (ref 12.0–15.0)
Lymphocytes Relative: 31.2 % (ref 12.0–46.0)
Lymphs Abs: 1.1 10*3/uL (ref 0.7–4.0)
MCHC: 33.1 g/dL (ref 30.0–36.0)
MCV: 90.1 fl (ref 78.0–100.0)
Monocytes Absolute: 0.3 10*3/uL (ref 0.1–1.0)
Monocytes Relative: 8.3 % (ref 3.0–12.0)
Neutro Abs: 1.9 10*3/uL (ref 1.4–7.7)
Neutrophils Relative %: 54.2 % (ref 43.0–77.0)
Platelets: 124 10*3/uL — ABNORMAL LOW (ref 150.0–400.0)
RBC: 3.96 Mil/uL (ref 3.87–5.11)
RDW: 13.7 % (ref 11.5–15.5)
WBC: 3.5 10*3/uL — ABNORMAL LOW (ref 4.0–10.5)

## 2021-06-12 LAB — BASIC METABOLIC PANEL
BUN: 22 mg/dL (ref 6–23)
CO2: 28 mEq/L (ref 19–32)
Calcium: 8.8 mg/dL (ref 8.4–10.5)
Chloride: 107 mEq/L (ref 96–112)
Creatinine, Ser: 0.89 mg/dL (ref 0.40–1.20)
GFR: 64.54 mL/min (ref >60.00)
Glucose, Bld: 92 mg/dL (ref 70–99)
Potassium: 4 mEq/L (ref 3.5–5.1)
Sodium: 141 mEq/L (ref 135–145)

## 2021-06-12 LAB — LIPID PANEL
Cholesterol: 145 mg/dL (ref 0–200)
HDL: 59.3 mg/dL (ref >39.00)
LDL Cholesterol: 71 mg/dL (ref 0–99)
NonHDL: 85.57
Total CHOL/HDL Ratio: 2
Triglycerides: 75 mg/dL (ref 0.0–149.0)
VLDL: 15 mg/dL (ref 0.0–40.0)

## 2021-06-12 LAB — HEPATIC FUNCTION PANEL
ALT: 11 U/L (ref 0–35)
AST: 20 U/L (ref 0–37)
Albumin: 3.8 g/dL (ref 3.5–5.2)
Alkaline Phosphatase: 43 U/L (ref 39–117)
Bilirubin, Direct: 0.1 mg/dL (ref 0.0–0.3)
Total Bilirubin: 0.5 mg/dL (ref 0.2–1.2)
Total Protein: 6 g/dL (ref 6.0–8.3)

## 2021-06-12 LAB — TSH: TSH: 3.59 u[IU]/mL (ref 0.35–5.50)

## 2021-06-12 LAB — FERRITIN: Ferritin: 42 ng/mL (ref 10.0–291.0)

## 2021-06-12 NOTE — Progress Notes (Signed)
Patient ID: Brandi Ramos, female   DOB: [birth year], 75 y.o.   MRN: [id number]   Subjective:    Patient ID: Brandi Ramos, female    DOB: 03/29/1946, 75 y.o.   MRN: [id number]  This visit occurred during the SARS-CoV-2 public health emergency.  Safety protocols were in place, including screening questions prior to the visit, additional usage of staff PPE, and extensive cleaning of exam room while observing appropriate contact time as indicated for disinfecting solutions.   Patient here for her physical exam.   Chief Complaint  Patient presents with   Annual Exam   .   HPI Increased stress.  Discussed.  Has good support.  Discussed therapy.  Will let me know if desires any further intervention.  Had her mammogram last week.  Trying to stay active.  No chest pain or sob reported.  S/p shoulder injection 05/2021.  Has noticed occasional palpitations.  May occur qod.  Notices when sitting.  May last two minutes and then resolves.  No chest pain, palpitations or sob with increased activity or exertion.  Off magnesium.  Did not feel helped.  Problems with constipation.  Was on linzess.  Helped, but was expensive.  Using dulcolax now - 2x/week.  Discussed miralax.     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    Past Surgical History:  Procedure Laterality Date   ABDOMINAL HYSTERECTOMY  1984   COLONOSCOPY     COLONOSCOPY WITH PROPOFOL N/A 09/26/2018   Procedure: COLONOSCOPY WITH PROPOFOL;  Surgeon:  Lollie Sails, MD;  Location: [facility] ENDOSCOPY;  Service: Endoscopy;  Laterality: N/A;   ESOPHAGOGASTRODUODENOSCOPY (EGD) WITH PROPOFOL N/A 10/31/2015   Procedure: ESOPHAGOGASTRODUODENOSCOPY (EGD) WITH PROPOFOL;  Surgeon: Lollie Sails, MD;  Location: [facility] ENDOSCOPY;  Service: Endoscopy;  Laterality: N/A;   ESOPHAGOGASTRODUODENOSCOPY (EGD) WITH PROPOFOL N/A 09/26/2018   Procedure: ESOPHAGOGASTRODUODENOSCOPY (EGD) WITH PROPOFOL;  Surgeon: Lollie Sails, MD;  Location: [facility] ENDOSCOPY;  Service: Endoscopy;  Laterality: N/A;   LAPAROSCOPIC APPENDECTOMY N/A 10/29/2018   Procedure: APPENDECTOMY LAPAROSCOPIC;  Surgeon: Herbert Pun, MD;  Location: [facility] ORS;  Service: General;  Laterality: N/A;   LAPAROSCOPIC BILATERAL SALPINGO OOPHERECTOMY Bilateral 10/29/2018   Procedure: LAPAROSCOPIC BILATERAL SALPINGO OOPHORECTOMY;  Surgeon: Ward, Honor Loh, MD;  Location: [facility] ORS;  Service: Gynecology;  Laterality: Bilateral;   Family History  Problem Relation Age of Onset   CAD Mother        s/p CABG   Hypercholesterolemia Mother    Breast cancer Mother 28   Heart disease Mother    Lung cancer Mother    Hypertension Father    Migraines Father    Hypercholesterolemia Father    Lung cancer Father    Migraines Sister    Arthritis Sister    Lymphoma Other        grandmother  Colon cancer Neg Hx    Social History   Socioeconomic History   Marital status: Widowed    Spouse name: Not on file   Number of children: 2   Years of education: Not on file   Highest education level: Not on file  Occupational History   Not on file  Tobacco Use   Smoking status: Never   Smokeless tobacco: Never  Vaping Use   Vaping Use: Never used  Substance and Sexual Activity   Alcohol use: Yes    Alcohol/week: 0.0 standard drinks    Comment: wine occassionally   Drug use: No   Sexual activity: Not Currently  Other Topics Concern   Not on file  Social History Narrative   Not on file   Social  Determinants of Health   Financial Resource Strain: Not on file  Food Insecurity: Not on file  Transportation Needs: Not on file  Physical Activity: Not on file  Stress: Not on file  Social Connections: Not on file     Review of Systems  Constitutional:  Negative for appetite change and unexpected weight change.  HENT:  Negative for congestion and sinus pressure.   Respiratory:  Negative for cough, chest tightness and shortness of breath.   Cardiovascular:  Negative for chest pain, palpitations and leg swelling.  Gastrointestinal:  Negative for abdominal pain, diarrhea, nausea and vomiting.  Genitourinary:  Negative for difficulty urinating and dysuria.  Musculoskeletal:  Negative for joint swelling and myalgias.       S/p shoulder injection.   Skin:  Negative for color change and rash.  Neurological:  Negative for dizziness and light-headedness.       No increased headaches.  Controlled.   Psychiatric/Behavioral:  Negative for agitation and dysphoric mood.       Objective:     BP 122/76   Pulse 66   Temp (!) 95.7 F (35.4 C)   Ht 5' 4.02" (1.626 m)   Wt 120 lb 3.2 oz (54.5 kg)   SpO2 99%   BMI 20.62 kg/m  Wt Readings from Last 3 Encounters:  06/12/21 120 lb 3.2 oz (54.5 kg)  03/10/21 118 lb 3.2 oz (53.6 kg)  03/02/21 119 lb (54 kg)    Physical Exam Vitals reviewed.  Constitutional:      General: She is not in acute distress.    Appearance: Normal appearance.  HENT:     Head: Normocephalic and atraumatic.     Right Ear: External ear normal.     Left Ear: External ear normal.  Eyes:     General: No scleral icterus.       Right eye: No discharge.        Left eye: No discharge.     Conjunctiva/sclera: Conjunctivae normal.  Neck:     Thyroid: No thyromegaly.  Cardiovascular:     Rate and Rhythm: Normal rate and regular rhythm.  Pulmonary:     Effort: No respiratory distress.     Breath sounds: Normal breath sounds. No wheezing.  Abdominal:     General:  Bowel sounds are normal.     Palpations: Abdomen is soft.     Tenderness: There is no abdominal tenderness.  Musculoskeletal:        General: No swelling or tenderness.     Cervical back: Neck supple. No tenderness.  Lymphadenopathy:     Cervical: No cervical adenopathy.  Skin:    Findings: No erythema or rash.  Neurological:     Mental  Status: She is alert.  Psychiatric:        Mood and Affect: Mood normal.        Behavior: Behavior normal.     Outpatient Encounter Medications as of 06/12/2021  Medication Sig   ALPRAZolam (XANAX) 0.25 MG tablet TAKE 1 TABLET BY MOUTH EVERY DAY AS NEEDED   busPIRone (BUSPAR) 10 MG tablet Take 1 tablet (10 mg total) by mouth daily.   DULoxetine (CYMBALTA) 60 MG capsule Take 1 capsule (60 mg total) by mouth daily.   EMGALITY 120 MG/ML SOAJ every 30 (thirty) days.   fluticasone (FLONASE) 50 MCG/ACT nasal spray SHAKE LIQUID AND USE 2 SPRAYS IN EACH NOSTRIL DAILY   loratadine (CLARITIN) 10 MG tablet Take 10 mg by mouth daily.   meloxicam (MOBIC) 15 MG tablet TAKE 1 TABLET BY MOUTH ONCE DAILY WITH A MEAL   ondansetron (ZOFRAN ODT) 4 MG disintegrating tablet Take 1 tablet (4 mg total) by mouth 2 (two) times daily as needed for nausea or vomiting.   propranolol ER (INDERAL LA) 60 MG 24 hr capsule TK 1 C PO D   RABEprazole (ACIPHEX) 20 MG tablet Take 1 tablet (20 mg total) by mouth daily.   rizatriptan (MAXALT) 10 MG tablet Take 10 mg by mouth as needed. May repeat in 2 hours if needed   rosuvastatin (CRESTOR) 5 MG tablet TAKE 1 TABLET BY MOUTH ON MONDAY, WEDNESDAY AND FRIDAY   SUMAtriptan (IMITREX) 100 MG tablet    topiramate (TOPAMAX) 50 MG tablet TAKE 1 AND 1/2 TABLET BY MOUTH IN MORNING AND 1 TABLET BY MOUTH IN EVENING   [DISCONTINUED] Fe Fum-FePoly-Vit C-Vit B3 (INTEGRA) 62.5-62.5-40-3 MG CAPS TAKE 1 CAPSULE BY MOUTH DAILY   [DISCONTINUED] magnesium oxide (MAG-OX) 400 (240 Mg) MG tablet Take 1 tablet (400 mg total) by mouth daily. (Patient not taking:  Reported on 06/12/2021)   [DISCONTINUED] sucralfate (CARAFATE) 1 g tablet Take 1 tablet (1 g total) by mouth 2 (two) times daily as needed. (Patient not taking: Reported on 06/12/2021)   [DISCONTINUED] traZODone (DESYREL) 50 MG tablet Take 50 mg by mouth at bedtime. (Patient not taking: Reported on 06/12/2021)   No facility-administered encounter medications on file as of 06/12/2021.     Lab Results  Component Value Date   WBC 3.5 (L) 06/12/2021   HGB 11.8 (L) 06/12/2021   HCT 35.7 (L) 06/12/2021   PLT 124.0 (L) 06/12/2021   GLUCOSE 92 06/12/2021   CHOL 145 06/12/2021   TRIG 75.0 06/12/2021   HDL 59.30 06/12/2021   LDLCALC 71 06/12/2021   ALT 11 06/12/2021   AST 20 06/12/2021   NA 141 06/12/2021   K 4.0 06/12/2021   CL 107 06/12/2021   CREATININE 0.89 06/12/2021   BUN 22 06/12/2021   CO2 28 06/12/2021   TSH 3.59 06/12/2021   HGBA1C 5.5 01/15/2018       Assessment & Plan:   Problem List Items Addressed This Visit     Anemia - Primary    Follow cbc.       Relevant Orders   CBC with Differential/Platelet (Completed)   Ferritin (Completed)   Constipation    Takes dulcolax 2x/week.  Discussed miralax.  Has seen GI.  Linzess helped, but cost is an issue.  Follow.       GERD (gastroesophageal reflux disease)    No upper symptoms reported.  Continue Aciphex.      Health care maintenance    Physical today 06/12/21.  Mammogram 06/07/21 - results  pending.  Colonoscopy 09/2018 - recommended f/u colonoscopy in 5 years.        Hematuria    Being followed by urology as outlined.  Being followed for kidney stone.  They recommended follow-up in 1 year with KUB and urinalysis.      Hypercholesterolemia    Continue Crestor.  Low-cholesterol diet and exercise.  Follow lipid panel liver function test.      Relevant Orders   Lipid panel (Completed)   TSH (Completed)   Hepatic function panel (Completed)   Basic metabolic panel (Completed)   Leukopenia    Has had persistent  decreased white blood cell count/pancytopenia.  Discussed with hematology.  They recommended continuing to follow.      Migraine headache    Followed by neurology.  Continues topamax.  Follow.       Mild depression    Discussed with her today.  She continues on trazodone, Cymbalta and BuSpar.  Overall she feels she is doing better.  Follow.      Palpitations    Saw cardiology 12/2020.  Recommended if persistent problems, can place zio monitor.  Has noticed persistent intermittent palpitations.  Off magnesium.  Felt did not help.  Discussed f/u EKG today.  Plans to f/u with cardiology.        Thrombocytopenia ([place])    Have discussed with hematology.  Follow cbc.         Einar Pheasant, MD

## 2021-06-12 NOTE — Assessment & Plan Note (Signed)
Physical today 06/12/21.  Mammogram 06/07/21 - results pending.  Colonoscopy 09/2018 - recommended f/u colonoscopy in 5 years.

## 2021-06-16 ENCOUNTER — Telehealth: Payer: Self-pay | Admitting: Internal Medicine

## 2021-06-16 MED ORDER — INTEGRA 62.5-62.5-40-3 MG PO CAPS: 1.0000 | ORAL_CAPSULE | Freq: Every day | ORAL | 1 refills | Status: DC

## 2021-06-16 NOTE — Telephone Encounter (Signed)
Patient called and needs a refill on her Fe Fum-FePoly-Vit C-Vit B3 (INTEGRA) 62.5-62.5-40-3 MG CAPS.

## 2021-06-17 NOTE — Assessment & Plan Note (Signed)
No upper symptoms reported.  Continue Aciphex.

## 2021-06-17 NOTE — Assessment & Plan Note (Signed)
Have discussed with hematology.  Follow cbc.  

## 2021-06-17 NOTE — Assessment & Plan Note (Signed)
Has had persistent decreased white blood cell count/pancytopenia.  Discussed with hematology.  They recommended continuing to follow. 

## 2021-06-17 NOTE — Assessment & Plan Note (Signed)
Discussed with her today.  She continues on trazodone, Cymbalta and BuSpar.  Overall she feels she is doing better.  Follow.

## 2021-06-17 NOTE — Assessment & Plan Note (Signed)
Takes dulcolax 2x/week.  Discussed miralax.  Has seen GI.  Linzess helped, but cost is an issue.  Follow.

## 2021-06-17 NOTE — Assessment & Plan Note (Signed)
Being followed by urology as outlined.  Being followed for kidney stone.  They recommended follow-up in 1 year with KUB and urinalysis. 

## 2021-06-17 NOTE — Assessment & Plan Note (Signed)
Followed by neurology.  Continues topamax.  Follow.

## 2021-06-17 NOTE — Assessment & Plan Note (Signed)
Continue Crestor.  Low-cholesterol diet and exercise.  Follow lipid panel liver function test. 

## 2021-06-17 NOTE — Assessment & Plan Note (Signed)
Follow [facility]bc.  

## 2021-06-17 NOTE — Assessment & Plan Note (Signed)
Saw cardiology 12/2020.  Recommended if persistent problems, can place zio monitor.  Has noticed persistent intermittent palpitations.  Off magnesium.  Felt did not help.  Discussed f/u EKG today.  Plans to f/u with cardiology.

## 2021-07-03 DIAGNOSIS — J3089 Other allergic rhinitis: Secondary | ICD-10-CM | POA: Diagnosis not present

## 2021-07-03 DIAGNOSIS — G43719 Chronic migraine without aura, intractable, without status migrainosus: Secondary | ICD-10-CM | POA: Diagnosis not present

## 2021-07-03 DIAGNOSIS — G4723 Circadian rhythm sleep disorder, irregular sleep wake type: Secondary | ICD-10-CM | POA: Diagnosis not present

## 2021-07-03 DIAGNOSIS — G4701 Insomnia due to medical condition: Secondary | ICD-10-CM | POA: Diagnosis not present

## 2021-07-04 DIAGNOSIS — M79672 Pain in left foot: Secondary | ICD-10-CM | POA: Diagnosis not present

## 2021-07-10 ENCOUNTER — Telehealth: Payer: Self-pay | Admitting: Internal Medicine

## 2021-07-10 ENCOUNTER — Other Ambulatory Visit: Payer: Self-pay | Admitting: Internal Medicine

## 2021-07-10 NOTE — Telephone Encounter (Signed)
Pt is requesting a refill on her medication Rabeprazole. States she already spoke with pharmacy and the pharmacy stated they have not received anything back.

## 2021-07-11 DIAGNOSIS — H40003 Preglaucoma, unspecified, bilateral: Secondary | ICD-10-CM | POA: Diagnosis not present

## 2021-07-11 NOTE — Telephone Encounter (Signed)
Medication sent to pharmacy 07/10/21

## 2021-07-20 DIAGNOSIS — H524 Presbyopia: Secondary | ICD-10-CM | POA: Diagnosis not present

## 2021-08-05 ENCOUNTER — Other Ambulatory Visit: Payer: Self-pay | Admitting: Internal Medicine

## 2021-08-16 ENCOUNTER — Other Ambulatory Visit: Payer: Self-pay | Admitting: Internal Medicine

## 2021-09-20 ENCOUNTER — Encounter: Payer: Self-pay | Admitting: Internal Medicine

## 2021-09-20 ENCOUNTER — Other Ambulatory Visit: Payer: Self-pay

## 2021-09-20 ENCOUNTER — Ambulatory Visit (INDEPENDENT_AMBULATORY_CARE_PROVIDER_SITE_OTHER): Payer: Medicare Other | Admitting: Internal Medicine

## 2021-09-20 VITALS — BP 118/70 | HR 57 | Temp 98.0°F | Ht 64.0 in | Wt 122.0 lb

## 2021-09-20 DIAGNOSIS — R319 Hematuria, unspecified: Secondary | ICD-10-CM

## 2021-09-20 DIAGNOSIS — F419 Anxiety disorder, unspecified: Secondary | ICD-10-CM

## 2021-09-20 DIAGNOSIS — E7800 Pure hypercholesterolemia, unspecified: Secondary | ICD-10-CM | POA: Diagnosis not present

## 2021-09-20 DIAGNOSIS — D72819 Decreased white blood cell count, unspecified: Secondary | ICD-10-CM

## 2021-09-20 DIAGNOSIS — Z9109 Other allergy status, other than to drugs and biological substances: Secondary | ICD-10-CM

## 2021-09-20 DIAGNOSIS — Z23 Encounter for immunization: Secondary | ICD-10-CM | POA: Diagnosis not present

## 2021-09-20 DIAGNOSIS — D649 Anemia, unspecified: Secondary | ICD-10-CM

## 2021-09-20 DIAGNOSIS — F32A Depression, unspecified: Secondary | ICD-10-CM

## 2021-09-20 DIAGNOSIS — K5900 Constipation, unspecified: Secondary | ICD-10-CM

## 2021-09-20 DIAGNOSIS — D696 Thrombocytopenia, unspecified: Secondary | ICD-10-CM | POA: Diagnosis not present

## 2021-09-20 DIAGNOSIS — G43709 Chronic migraine without aura, not intractable, without status migrainosus: Secondary | ICD-10-CM | POA: Diagnosis not present

## 2021-09-20 DIAGNOSIS — K219 Gastro-esophageal reflux disease without esophagitis: Secondary | ICD-10-CM

## 2021-09-20 DIAGNOSIS — E538 Deficiency of other specified B group vitamins: Secondary | ICD-10-CM | POA: Diagnosis not present

## 2021-09-20 LAB — CBC WITH DIFFERENTIAL/PLATELET
Basophils Absolute: 0 10*3/uL (ref 0.0–0.1)
Basophils Relative: 0.6 % (ref 0.0–3.0)
Eosinophils Absolute: 0.2 10*3/uL (ref 0.0–0.7)
Eosinophils Relative: 5.2 % — ABNORMAL HIGH (ref 0.0–5.0)
HCT: 36.4 % (ref 36.0–46.0)
Hemoglobin: 12 g/dL (ref 12.0–15.0)
Lymphocytes Relative: 33.8 % (ref 12.0–46.0)
Lymphs Abs: 1.2 10*3/uL (ref 0.7–4.0)
MCHC: 33 g/dL (ref 30.0–36.0)
MCV: 90.3 fl (ref 78.0–100.0)
Monocytes Absolute: 0.3 10*3/uL (ref 0.1–1.0)
Monocytes Relative: 9.5 % (ref 3.0–12.0)
Neutro Abs: 1.9 10*3/uL (ref 1.4–7.7)
Neutrophils Relative %: 50.9 % (ref 43.0–77.0)
Platelets: 140 10*3/uL — ABNORMAL LOW (ref 150.0–400.0)
RBC: 4.03 Mil/uL (ref 3.87–5.11)
RDW: 13.8 % (ref 11.5–15.5)
WBC: 3.7 10*3/uL — ABNORMAL LOW (ref 4.0–10.5)

## 2021-09-20 LAB — FERRITIN: Ferritin: 33.3 ng/mL (ref 10.0–291.0)

## 2021-09-20 LAB — HEPATIC FUNCTION PANEL
ALT: 12 U/L (ref 0–35)
AST: 21 U/L (ref 0–37)
Albumin: 3.9 g/dL (ref 3.5–5.2)
Alkaline Phosphatase: 44 U/L (ref 39–117)
Bilirubin, Direct: 0.1 mg/dL (ref 0.0–0.3)
Total Bilirubin: 0.5 mg/dL (ref 0.2–1.2)
Total Protein: 6.1 g/dL (ref 6.0–8.3)

## 2021-09-20 LAB — LIPID PANEL
Cholesterol: 161 mg/dL (ref 0–200)
HDL: 59.1 mg/dL (ref >39.00)
LDL Cholesterol: 85 mg/dL (ref 0–99)
NonHDL: 101.84
Total CHOL/HDL Ratio: 3
Triglycerides: 82 mg/dL (ref 0.0–149.0)
VLDL: 16.4 mg/dL (ref 0.0–40.0)

## 2021-09-20 LAB — BASIC METABOLIC PANEL
BUN: 23 mg/dL (ref 6–23)
CO2: 30 mEq/L (ref 19–32)
Calcium: 8.7 mg/dL (ref 8.4–10.5)
Chloride: 105 mEq/L (ref 96–112)
Creatinine, Ser: 0.88 mg/dL (ref 0.40–1.20)
GFR: 65.29 mL/min (ref >60.00)
Glucose, Bld: 87 mg/dL (ref 70–99)
Potassium: 3.8 mEq/L (ref 3.5–5.1)
Sodium: 140 mEq/L (ref 135–145)

## 2021-09-20 LAB — VITAMIN B12: Vitamin B-12: >1550 pg/mL — ABNORMAL HIGH (ref 211–911)

## 2021-09-20 MED ORDER — FLUTICASONE PROPIONATE 50 MCG/ACT NA SUSP: 2.0000 | Freq: Every day | NASAL | 3 refills | Status: DC

## 2021-09-20 NOTE — Progress Notes (Signed)
Patient ID: Brandi Ramos, female   DOB: 10-Aug-1946, 76 y.o.   MRN: [id number]   Subjective:    Patient ID: Brandi Ramos, female    DOB: [birth year], 76 y.o.   MRN: [id number]  This visit occurred during the SARS-CoV-2 public health emergency.  Safety protocols were in place, including screening questions prior to the visit, additional usage of staff PPE, and extensive cleaning of exam room while observing appropriate contact time as indicated for disinfecting solutions.   Patient here for a scheduled follow up.    HPI Here to follow up regarding increased stress, depression, blood counts and anxiety.  Sees neurology.  Headaches stable.  Tries to stay active.  No chest pain or sob reported.  No increased acid reflux.  Persistent problems with constipation.  Miralax caused her to feel bloated.  Has seen GI.  Has f/u planned next week to discuss.  Discussed shingles vaccine.  Discussed bone density.  Wants to postpone.  Will notify me when agreeable.     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    Past Surgical History:  Procedure Laterality Date   ABDOMINAL HYSTERECTOMY  1984   COLONOSCOPY     COLONOSCOPY WITH PROPOFOL N/A 09/26/2018   Procedure: COLONOSCOPY WITH PROPOFOL;  Surgeon: Lollie Sails, MD;  Location: [facility] ENDOSCOPY;  Service: Endoscopy;  Laterality: N/A;   ESOPHAGOGASTRODUODENOSCOPY (EGD) WITH PROPOFOL N/A 10/31/2015   Procedure: ESOPHAGOGASTRODUODENOSCOPY (EGD) WITH PROPOFOL;   Surgeon: Lollie Sails, MD;  Location: [facility] ENDOSCOPY;  Service: Endoscopy;  Laterality: N/A;   ESOPHAGOGASTRODUODENOSCOPY (EGD) WITH PROPOFOL N/A 09/26/2018   Procedure: ESOPHAGOGASTRODUODENOSCOPY (EGD) WITH PROPOFOL;  Surgeon: Lollie Sails, MD;  Location: [facility] ENDOSCOPY;  Service: Endoscopy;  Laterality: N/A;   LAPAROSCOPIC APPENDECTOMY N/A 10/29/2018   Procedure: APPENDECTOMY LAPAROSCOPIC;  Surgeon: Herbert Pun, MD;  Location: [facility] ORS;  Service: General;  Laterality: N/A;   LAPAROSCOPIC BILATERAL SALPINGO OOPHERECTOMY Bilateral 10/29/2018   Procedure: LAPAROSCOPIC BILATERAL SALPINGO OOPHORECTOMY;  Surgeon: Ward, Honor Loh, MD;  Location: [facility] ORS;  Service: Gynecology;  Laterality: Bilateral;   Family History  Problem Relation Age of Onset   CAD Mother        s/p CABG   Hypercholesterolemia Mother    Breast cancer Mother 60   Heart disease Mother    Lung cancer Mother    Hypertension Father    Migraines Father    Hypercholesterolemia Father    Lung cancer Father    Migraines Sister    Arthritis Sister    Lymphoma Other        grandmother   Colon cancer Neg Hx    Social History   Socioeconomic History   Marital status: Widowed    Spouse name: Not on file   Number of children: 2   Years of education: Not on file  Highest education level: Not on file  Occupational History   Not on file  Tobacco Use   Smoking status: Never   Smokeless tobacco: Never  Vaping Use   Vaping Use: Never used  Substance and Sexual Activity   Alcohol use: Yes    Alcohol/week: 0.0 standard drinks    Comment: wine occassionally   Drug use: No   Sexual activity: Not Currently  Other Topics Concern   Not on file  Social History Narrative   Not on file   Social Determinants of Health   Financial Resource Strain: Not on file  Food Insecurity: Not on file  Transportation Needs: Not on file  Physical Activity: Not on file  Stress: Not on file  Social Connections: Not on  file     Review of Systems  Constitutional:  Negative for appetite change and unexpected weight change.  HENT:  Negative for congestion and sinus pressure.   Respiratory:  Negative for cough, chest tightness and shortness of breath.   Cardiovascular:  Negative for chest pain, palpitations and leg swelling.  Gastrointestinal:  Negative for abdominal pain, diarrhea, nausea and vomiting.  Genitourinary:  Negative for difficulty urinating and dysuria.  Musculoskeletal:  Negative for joint swelling and myalgias.  Skin:  Negative for color change and rash.  Neurological:  Negative for dizziness and light-headedness.       Headaches stable.   Psychiatric/Behavioral:  Negative for agitation and dysphoric mood.       Objective:     BP 118/70 (BP Location: Left Arm, Patient Position: Sitting, Cuff Size: Normal)    Pulse (!) 57    Temp 98 F (36.7 C) (Oral)    Ht 5\' 4"  (1.626 m)    Wt 122 lb (55.3 kg)    SpO2 99%    BMI 20.94 kg/m  Wt Readings from Last 3 Encounters:  09/20/21 122 lb (55.3 kg)  06/12/21 120 lb 3.2 oz (54.5 kg)  03/10/21 118 lb 3.2 oz (53.6 kg)    Physical Exam Vitals reviewed.  Constitutional:      General: She is not in acute distress.    Appearance: Normal appearance.  HENT:     Head: Normocephalic and atraumatic.     Right Ear: External ear normal.     Left Ear: External ear normal.  Eyes:     General: No scleral icterus.       Right eye: No discharge.        Left eye: No discharge.     Conjunctiva/sclera: Conjunctivae normal.  Neck:     Thyroid: No thyromegaly.  Cardiovascular:     Rate and Rhythm: Normal rate and regular rhythm.  Pulmonary:     Effort: No respiratory distress.     Breath sounds: Normal breath sounds. No wheezing.  Abdominal:     General: Bowel sounds are normal.     Palpations: Abdomen is soft.     Tenderness: There is no abdominal tenderness.  Musculoskeletal:        General: No swelling or tenderness.     Cervical back: Neck  supple. No tenderness.  Lymphadenopathy:     Cervical: No cervical adenopathy.  Skin:    Findings: No erythema or rash.  Neurological:     Mental Status: She is alert.  Psychiatric:        Mood and Affect: Mood normal.        Behavior: Behavior normal.     Outpatient Encounter Medications as of 09/20/2021  Medication  Sig   ALPRAZolam (XANAX) 0.25 MG tablet TAKE 1 TABLET BY MOUTH EVERY DAY AS NEEDED   busPIRone (BUSPAR) 10 MG tablet Take 1 tablet (10 mg total) by mouth daily.   DULoxetine (CYMBALTA) 60 MG capsule Take 1 capsule (60 mg total) by mouth daily.   EMGALITY 120 MG/ML SOAJ every 30 (thirty) days.   Fe Fum-FePoly-Vit C-Vit B3 (INTEGRA) 62.5-62.5-40-3 MG CAPS TAKE 1 CAPSULE BY MOUTH DAILY   loratadine (CLARITIN) 10 MG tablet Take 10 mg by mouth daily.   meloxicam (MOBIC) 15 MG tablet TAKE 1 TABLET BY MOUTH ONCE DAILY WITH A MEAL   ondansetron (ZOFRAN ODT) 4 MG disintegrating tablet Take 1 tablet (4 mg total) by mouth 2 (two) times daily as needed for nausea or vomiting.   propranolol ER (INDERAL LA) 60 MG 24 hr capsule TK 1 C PO D   RABEprazole (ACIPHEX) 20 MG tablet TAKE 1 TABLET(20 MG) BY MOUTH DAILY   rizatriptan (MAXALT) 10 MG tablet Take 10 mg by mouth as needed. May repeat in 2 hours if needed   rosuvastatin (CRESTOR) 5 MG tablet TAKE 1 TABLET BY MOUTH ON MONDAY, WEDNESDAY AND FRIDAY.   SUMAtriptan (IMITREX) 100 MG tablet    topiramate (TOPAMAX) 50 MG tablet TAKE 1 AND 1/2 TABLET BY MOUTH IN MORNING AND 1 TABLET BY MOUTH IN EVENING   [DISCONTINUED] fluticasone (FLONASE) 50 MCG/ACT nasal spray SHAKE LIQUID AND USE 2 SPRAYS IN EACH NOSTRIL DAILY   fluticasone (FLONASE) 50 MCG/ACT nasal spray Place 2 sprays into both nostrils daily.   No facility-administered encounter medications on file as of 09/20/2021.     Lab Results  Component Value Date   WBC 3.7 (L) 09/20/2021   HGB 12.0 09/20/2021   HCT 36.4 09/20/2021   PLT 140.0 (L) 09/20/2021   GLUCOSE 87 09/20/2021    CHOL 161 09/20/2021   TRIG 82.0 09/20/2021   HDL 59.10 09/20/2021   LDLCALC 85 09/20/2021   ALT 12 09/20/2021   AST 21 09/20/2021   NA 140 09/20/2021   K 3.8 09/20/2021   CL 105 09/20/2021   CREATININE 0.88 09/20/2021   BUN 23 09/20/2021   CO2 30 09/20/2021   TSH 3.59 06/12/2021   HGBA1C 5.5 01/15/2018    MM 3D SCREEN BREAST BILATERAL  Result Date: 06/13/2021 CLINICAL DATA:  Screening. EXAM: DIGITAL SCREENING BILATERAL MAMMOGRAM WITH TOMOSYNTHESIS AND CAD TECHNIQUE: Bilateral screening digital craniocaudal and mediolateral oblique mammograms were obtained. Bilateral screening digital breast tomosynthesis was performed. The images were evaluated with computer-aided detection. COMPARISON:  Previous exam(s). ACR Breast Density Category c: The breast tissue is heterogeneously dense, which may obscure small masses. FINDINGS: There are no findings suspicious for malignancy. IMPRESSION: No mammographic evidence of malignancy. A result letter of this screening mammogram will be mailed directly to the patient. RECOMMENDATION: Screening mammogram in one year. (Code:SM-B-01Y) BI-RADS CATEGORY  1: Negative. Electronically Signed   By: Ammie Ferrier M.D.   On: 06/13/2021 14:36      Assessment & Plan:   Problem List Items Addressed This Visit     Anemia    Follow cbc.       Relevant Orders   CBC with Differential/Platelet (Completed)   Ferritin (Completed)   Anxiety    Continue cymbalta and buspar.  Follow.       B12 deficiency    Check b12 level.       Relevant Orders   Vitamin B12 (Completed)   Constipation    Had reported takes  dulcolax 2x/week.  Intolerant to iralax.  Makes her feel bloated.  Has seen GI.  Linzess helped, but cost is an issue.  Has f/u next week.        Environmental allergies    Per neurology - singulair.  Stable.  Follow.       GERD (gastroesophageal reflux disease)    No upper symptoms reported.  Continue Aciphex.      Hematuria    Being followed  by urology as outlined.  Being followed for kidney stone.  They recommended follow-up in 1 year with KUB and urinalysis.      Hypercholesterolemia    Continue Crestor.  Low-cholesterol diet and exercise.  Follow lipid panel liver function test.      Relevant Orders   Hepatic function panel (Completed)   Lipid panel (Completed)   Basic metabolic panel (Completed)   Leukopenia    Has had persistent decreased white blood cell count/pancytopenia.  Discussed with hematology.  They recommended continuing to follow.      Migraine headache - Primary    Followed by neurology.  On topamax and propranolol.  Has aimovig.  Stable.  Follow.       Mild depression    Discussed with her today.  She continues on trazodone, Cymbalta and BuSpar.  Overall she feels she is doing better.  Follow.      Thrombocytopenia ([place])    Have discussed with hematology.  Follow cbc.       Other Visit Diagnoses     Need for shingles vaccine       Relevant Orders   Varicella-zoster vaccine IM (Shingrix) (Completed)        Einar Pheasant, MD

## 2021-09-24 ENCOUNTER — Encounter: Payer: Self-pay | Admitting: Internal Medicine

## 2021-09-24 NOTE — Assessment & Plan Note (Signed)
Check b12 level  

## 2021-09-24 NOTE — Assessment & Plan Note (Signed)
Being followed by urology as outlined.  Being followed for kidney stone.  They recommended follow-up in 1 year with KUB and urinalysis. 

## 2021-09-24 NOTE — Assessment & Plan Note (Signed)
Discussed with her today.  She continues on trazodone, Cymbalta and BuSpar.  Overall she feels she is doing better.  Follow.

## 2021-09-24 NOTE — Assessment & Plan Note (Signed)
Per neurology - singulair.  Stable.  Follow.

## 2021-09-24 NOTE — Assessment & Plan Note (Signed)
Continue cymbalta and buspar.  Follow.  

## 2021-09-24 NOTE — Assessment & Plan Note (Signed)
No upper symptoms reported.  Continue Aciphex.

## 2021-09-24 NOTE — Assessment & Plan Note (Signed)
Has had persistent decreased white blood cell count/pancytopenia.  Discussed with hematology.  They recommended continuing to follow. 

## 2021-09-24 NOTE — Assessment & Plan Note (Signed)
Followed by neurology.  On topamax and propranolol.  Has aimovig.  Stable.  Follow.  

## 2021-09-24 NOTE — Assessment & Plan Note (Signed)
Follow [facility]bc.  

## 2021-09-24 NOTE — Assessment & Plan Note (Signed)
Continue Crestor.  Low-cholesterol diet and exercise.  Follow lipid panel liver function test. 

## 2021-09-24 NOTE — Assessment & Plan Note (Signed)
Have discussed with hematology.  Follow cbc.  

## 2021-09-24 NOTE — Assessment & Plan Note (Signed)
Had reported takes dulcolax 2x/week.  Intolerant to iralax.  Makes her feel bloated.  Has seen GI.  Linzess helped, but cost is an issue.  Has f/u next week.

## 2021-09-26 DIAGNOSIS — K5909 Other constipation: Secondary | ICD-10-CM | POA: Diagnosis not present

## 2021-09-26 DIAGNOSIS — K219 Gastro-esophageal reflux disease without esophagitis: Secondary | ICD-10-CM | POA: Diagnosis not present

## 2021-10-13 ENCOUNTER — Other Ambulatory Visit: Payer: Self-pay | Admitting: Internal Medicine

## 2021-10-13 ENCOUNTER — Telehealth: Payer: Self-pay | Admitting: Internal Medicine

## 2021-10-13 MED ORDER — SUCRALFATE 1 G PO TABS: 1.0000 g | ORAL_TABLET | Freq: Two times a day (BID) | ORAL | 0 refills | Status: DC | PRN

## 2021-10-13 NOTE — Telephone Encounter (Signed)
Patient was calling to get refill because she uses these prn when she is nauseous. Has 3 tablets left and has not refilled since Oct. Ok to send in refill for her? Pt stated she would be evaluated if she developed nausea that became persistent.

## 2021-10-13 NOTE — Telephone Encounter (Signed)
Patient is wanting a refill for  sucralfate (CARAFATE) 1 g tablet  Patient is needing a new prescription for this medication due to symptoms of nausea. Patient states she sometimes gets nauseous and this medication has helped her in the past. Patient uses [facility] in [place].

## 2021-10-13 NOTE — Telephone Encounter (Signed)
Rx sent in to pharmacy for carafate.  Agree with need for evaluation if persistent or increased problems.

## 2021-10-13 NOTE — Telephone Encounter (Signed)
Patient is aware 

## 2021-10-13 NOTE — Addendum Note (Signed)
Addended by: Alisa Graff on: 10/13/2021 01:34 PM   Modules accepted: Orders

## 2021-10-23 ENCOUNTER — Telehealth: Payer: Self-pay | Admitting: Internal Medicine

## 2021-10-23 ENCOUNTER — Other Ambulatory Visit: Payer: Self-pay

## 2021-10-23 MED ORDER — ROSUVASTATIN CALCIUM 5 MG PO TABS: 5.0000 mg | ORAL_TABLET | ORAL | 3 refills | Status: DC

## 2021-10-23 MED ORDER — DULOXETINE HCL 60 MG PO CPEP: 60.0000 mg | ORAL_CAPSULE | Freq: Every day | ORAL | 1 refills | Status: DC

## 2021-10-23 NOTE — Telephone Encounter (Signed)
Medication refilled

## 2021-10-23 NOTE — Telephone Encounter (Signed)
Pt need refills on DULoxetine capsule and rosuvastatin sent to [facility] in [place]

## 2021-10-30 ENCOUNTER — Other Ambulatory Visit: Payer: Self-pay

## 2021-10-30 ENCOUNTER — Encounter: Payer: Self-pay | Admitting: Family

## 2021-10-30 ENCOUNTER — Ambulatory Visit (INDEPENDENT_AMBULATORY_CARE_PROVIDER_SITE_OTHER): Payer: Medicare Other | Admitting: Family

## 2021-10-30 VITALS — BP 122/82 | HR 73 | Temp 98.2°F | Ht 61.0 in | Wt 121.8 lb

## 2021-10-30 DIAGNOSIS — K2960 Other gastritis without bleeding: Secondary | ICD-10-CM

## 2021-10-30 DIAGNOSIS — F411 Generalized anxiety disorder: Secondary | ICD-10-CM | POA: Diagnosis not present

## 2021-10-30 DIAGNOSIS — F410 Panic disorder [episodic paroxysmal anxiety] without agoraphobia: Secondary | ICD-10-CM | POA: Diagnosis not present

## 2021-10-30 DIAGNOSIS — F3341 Major depressive disorder, recurrent, in partial remission: Secondary | ICD-10-CM | POA: Diagnosis not present

## 2021-10-30 MED ORDER — RABEPRAZOLE SODIUM 20 MG PO TBEC: 20.0000 mg | DELAYED_RELEASE_TABLET | Freq: Two times a day (BID) | ORAL | 1 refills | Status: DC

## 2021-10-30 NOTE — Progress Notes (Signed)
Subjective:    Patient ID: Brandi Ramos, female    DOB: [birth year], 76 y.o.   MRN: [id number]  CC: Brandi Ramos is a 76 y.o. female who presents today for an acute visit.    HPI: Complains of nausea,started approx 3 weeks ago. Occurring daily. Occurs when stomach is empty. Nause improves with eating. Endorses more burping.  Drinks 2 cups coffee per day and half glass of wine. She took 15mg  mobic one week ago which she uses periodically for left foot pain.   Symptoms feel like 'gastritis' . She h/o PUD.   She is compliant with Aciphex 20mg  qam prior to meal;  she restarted sucralfate 1g BID with relief.  She has previously had been on acidphex 20mg  bid however insurance didn't cover.   Denies vomiting, bitter taste in mouth, weight loss, trouble or pain swallowing.    Non smoker  History of gastritis seen last EGD 09/26/18 Colonoscopy 09/26/2018, Dr. 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    Past Surgical History:  Procedure Laterality Date   ABDOMINAL HYSTERECTOMY  1984   COLONOSCOPY     COLONOSCOPY WITH PROPOFOL N/A 09/26/2018   Procedure: COLONOSCOPY WITH PROPOFOL;  Surgeon: Brandi Sails, MD;  Location: [facility] ENDOSCOPY;  Service: Endoscopy;  Laterality: N/A;   ESOPHAGOGASTRODUODENOSCOPY (EGD) WITH PROPOFOL N/A 10/31/2015   Procedure: ESOPHAGOGASTRODUODENOSCOPY (EGD) WITH PROPOFOL;  Surgeon: Brandi Sails, MD;  Location: [facility] ENDOSCOPY;   Service: Endoscopy;  Laterality: N/A;   ESOPHAGOGASTRODUODENOSCOPY (EGD) WITH PROPOFOL N/A 09/26/2018   Procedure: ESOPHAGOGASTRODUODENOSCOPY (EGD) WITH PROPOFOL;  Surgeon: Brandi Sails, MD;  Location: [facility] ENDOSCOPY;  Service: Endoscopy;  Laterality: N/A;   LAPAROSCOPIC APPENDECTOMY N/A 10/29/2018   Procedure: APPENDECTOMY LAPAROSCOPIC;  Surgeon: Brandi Pun, MD;  Location: [facility] ORS;  Service: General;  Laterality: N/A;   LAPAROSCOPIC BILATERAL SALPINGO OOPHERECTOMY Bilateral 10/29/2018   Procedure: LAPAROSCOPIC BILATERAL SALPINGO OOPHORECTOMY;  Surgeon: Ramos, Brandi Loh, MD;  Location: [facility] ORS;  Service: Gynecology;  Laterality: Bilateral;   Family History  Problem Relation Age of Onset   CAD Mother        s/p CABG   Hypercholesterolemia Mother    Breast cancer Mother 40   Heart disease Mother    Lung cancer Mother    Hypertension Father    Migraines Father    Hypercholesterolemia Father    Lung cancer Father    Migraines Sister    Arthritis Sister    Lymphoma Other        grandmother   Colon cancer Neg Hx     Allergies: Amitiza [lubiprostone] and Sulfa antibiotics Current Outpatient Medications on File Prior to Visit  Medication Sig Dispense Refill   ALPRAZolam (XANAX) 0.25 MG tablet TAKE 1 TABLET BY MOUTH EVERY DAY AS NEEDED (Patient not taking: Reported  on 10/30/2021) 30 tablet 0   busPIRone (BUSPAR) 10 MG tablet Take 1 tablet (10 mg total) by mouth daily. (Patient not taking: Reported on 10/30/2021) 30 tablet 2   DULoxetine (CYMBALTA) 60 MG capsule Take 1 capsule (60 mg total) by mouth daily. 90 capsule 1   EMGALITY 120 MG/ML SOAJ every 30 (thirty) days.     Fe Fum-FePoly-Vit C-Vit B3 (INTEGRA) 62.5-62.5-40-3 MG CAPS TAKE 1 CAPSULE BY MOUTH DAILY 30 capsule 1   fluticasone (FLONASE) 50 MCG/ACT nasal spray Place 2 sprays into both nostrils daily. 16 g 3   loratadine (CLARITIN) 10 MG tablet Take 10 mg by mouth daily.     meloxicam (MOBIC) 15 MG tablet TAKE 1 TABLET  BY MOUTH ONCE DAILY WITH A MEAL     ondansetron (ZOFRAN ODT) 4 MG disintegrating tablet Take 1 tablet (4 mg total) by mouth 2 (two) times daily as needed for nausea or vomiting. 14 tablet 0   propranolol ER (INDERAL LA) 60 MG 24 hr capsule TK 1 C PO D     rizatriptan (MAXALT) 10 MG tablet Take 10 mg by mouth as needed. May repeat in 2 hours if needed     rosuvastatin (CRESTOR) 5 MG tablet Take 1 tablet (5 mg total) by mouth every Monday, Wednesday, and Friday. 39 tablet 3   sucralfate (CARAFATE) 1 g tablet Take 1 tablet (1 g total) by mouth 2 (two) times daily as needed. 60 tablet 0   SUMAtriptan (IMITREX) 100 MG tablet      topiramate (TOPAMAX) 50 MG tablet TAKE 1 AND 1/2 TABLET BY MOUTH IN MORNING AND 1 TABLET BY MOUTH IN EVENING     No current facility-administered medications on file prior to visit.    Social History   Tobacco Use   Smoking status: Never   Smokeless tobacco: Never  Vaping Use   Vaping Use: Never used  Substance Use Topics   Alcohol use: Yes    Alcohol/week: 0.0 standard drinks    Comment: wine occassionally   Drug use: No    Review of Systems  Constitutional:  Negative for chills and fever.  HENT:  Negative for trouble swallowing.   Respiratory:  Negative for cough and shortness of breath.   Cardiovascular:  Negative for chest pain and palpitations.  Gastrointestinal:  Positive for nausea. Negative for abdominal distention, constipation, diarrhea and vomiting.     Objective:    BP 122/82 (BP Location: Left Arm, Patient Position: Sitting, Cuff Size: Small)    Pulse 73    Temp 98.2 F (36.8 C) (Oral)    Ht 5\' 1"  (1.549 m)    Wt 121 lb 12.8 oz (55.2 kg)    SpO2 99%    BMI 23.01 kg/m    Physical Exam Vitals reviewed.  Constitutional:      Appearance: Normal appearance. She is well-developed.  Eyes:     Conjunctiva/sclera: Conjunctivae normal.  Cardiovascular:     Rate and Rhythm: Normal rate and regular rhythm.     Pulses: Normal pulses.     Heart  sounds: Normal heart sounds.  Pulmonary:     Effort: Pulmonary effort is normal.     Breath sounds: Normal breath sounds. No wheezing, rhonchi or rales.  Abdominal:     General: Bowel sounds are normal. There is no distension.     Palpations: Abdomen is soft. Abdomen is not rigid. There is no fluid wave or mass.     Tenderness: There is no abdominal tenderness.  There is no guarding or rebound.  Skin:    General: Skin is warm and dry.  Neurological:     Mental Status: She is alert.  Psychiatric:        Speech: Speech normal.        Behavior: Behavior normal.        Thought Content: Thought content normal.       Assessment & Plan:   Problem List Items Addressed This Visit       Digestive   Gastritis, bile acid reflux - Primary    Patient is well-appearing.  Afebrile.  Benign abdominal exam.  Long discussion of symptoms which most likely suggest a flare of gastritis.  Advised her to stop alcohol, caffeine and meloxicam.  Temporarily we will increase Aciphex to 20 mg twice daily and after couple weeks, she will wean back to prior dosing of Aciphex 20 mg.  She will continue carafate twice daily as she has been taking if she finds helpful.  She will let us know if symptoms persist so we continue further evaluation      Relevant Medications   RABEprazole (ACIPHEX) 20 MG tablet      I have changed Clearence Ped. Cocker's RABEprazole. I am also having her maintain her rizatriptan, loratadine, SUMAtriptan, propranolol ER, topiramate, busPIRone, ondansetron, ALPRAZolam, meloxicam, Emgality, Integra, fluticasone, sucralfate, DULoxetine, and rosuvastatin.   Meds ordered this encounter  Medications   RABEprazole (ACIPHEX) 20 MG tablet    Sig: Take 1 tablet (20 mg total) by mouth 2 (two) times daily before a meal.    Dispense:  120 tablet    Refill:  1    Order Specific Question:   Supervising Provider    Answer:   Crecencio Mc [[id number]]    Return precautions given.   Risks, benefits,  and alternatives of the medications and treatment plan prescribed today were discussed, and patient expressed understanding.   Education regarding symptom management and diagnosis given to patient on AVS.  Continue to follow with Einar Pheasant, MD for routine health maintenance.   Brandi Ramos and I agreed with plan.   Mable Paris, FNP

## 2021-10-30 NOTE — Patient Instructions (Signed)
I agree with you that symptoms are most likely related to gastritis.  As discussed, please stop caffeine, anti-inflammatories including meloxicam, alcohol as these could be making symptoms worse.  You may increase Aciphex from 20 mg daily to 20 mg twice per day.  Do this for couple weeks until symptoms settle down and resolve and then you may wean back down to prior aciphex dosing after a couple of weeks as discussed.  Please let me know how you are doing and if any new concerns  Gastritis, Adult Gastritis is inflammation of the stomach. There are two kinds of gastritis: Acute gastritis. This kind develops suddenly. Chronic gastritis. This kind is much more common. It develops slowly and lasts for a long time. Gastritis happens when the lining of the stomach becomes weak or gets damaged. Without treatment, gastritis can lead to stomach bleeding and ulcers. What are the causes? This condition may be caused by: An infection. Drinking too much alcohol. Certain medicines. These include steroids, antibiotics, and some over-the-counter medicines, such as aspirin or ibuprofen. Having too much acid in the stomach. Having a disease of the stomach. Other causes may include: An allergic reaction. Some cancer treatments (radiation). Smoking cigarettes or the use of products that contain nicotine or tobacco. In some cases, the cause of this condition is not known. What increases the risk? Having a disease of the intestines. Having a disease in which the body's immune system attacks the body (autoimmune disease), such as Crohn's disease. Using aspirin or ibuprofen and other NSAIDs to treat other conditions, such as heart disease or chronic pain. Stress. What are the signs or symptoms? Symptoms of this condition include: Pain or a burning sensation in the upper abdomen. Nausea. Vomiting. An uncomfortable feeling of fullness after eating. Weight loss. Bad breath. Blood in your vomit or stool  (feces). In some cases, there are no symptoms. How is this diagnosed? This condition may be diagnosed based on your medical history, a physical exam, and tests. Tests may include: Your medical history and a description of your symptoms. A physical exam. Tests. These can include: Blood tests. Stool tests. A test in which a thin, flexible instrument with a light and a camera is passed down the esophagus and into the stomach (upper endoscopy). A test in which a tissue sample is removed to look at it under a microscope (biopsy). How is this treated? This condition may be treated with medicines. The medicines that are used vary depending on the cause of the gastritis. If the condition is caused by a bacterial infection, you may be given antibiotic medicines. If the condition is caused by too much acid in the stomach, you may be given medicines called H2 blockers, proton pump inhibitors, or antacids. Treatment may also involve stopping the use of certain medicines such as aspirin or ibuprofen and other NSAIDs. Follow these instructions at home: Medicines Take over-the-counter and prescription medicines only as told by your health care provider. If you were prescribed an antibiotic medicine, take it as told by your health care provider. Do not stop taking the antibiotic even if you start to feel better. Alcohol use Do not drink alcohol if: Your health care provider tells you not to drink. You are pregnant, may be pregnant, or are planning to become pregnant. If you drink alcohol: Limit your use to: 0-1 drink a day for women. 0-2 drinks a day for men. Know how much alcohol is in your drink. In the [place] one drink equals one 12  oz bottle of beer (355 mL), one 5 oz glass of wine (148 mL), or one 1 oz glass of hard liquor (44 mL). General instructions  Eat small, frequent meals instead of large meals. Avoid foods and drinks that make your symptoms worse. Talk with your health care provider  about ways to manage stress, such as getting regular exercise or practicing deep breathing, meditation, or yoga. Do not use any products that contain nicotine or tobacco. These products include cigarettes, chewing tobacco, and vaping devices, such as e-cigarettes. If you need help quitting, ask your health care provider. Drink enough fluid to keep your urine pale yellow. Keep all follow-up visits. This is important. Contact a health care provider if: Your symptoms get worse. Your abdominal pain gets worse. Your symptoms return after treatment. You have a fever. Get help right away if: You vomit blood or a substance that looks like coffee grounds. You have black or dark red stools. You are unable to keep fluids down. These symptoms may represent a serious problem that is an emergency. Do not wait to see if the symptoms will go away. Get medical help right away. Call your local emergency services (911 in the [place] Do not drive yourself to the hospital. Summary Gastritis is inflammation of the lining of the stomach that can occur suddenly (acute) or develop slowly over time (chronic). This condition is diagnosed with a medical history, a physical exam, or tests. This condition may be treated with medicines to treat infection or medicines to reduce the amount of acid in your stomach. Follow your health care provider's instructions about taking medicines, making changes to your diet, and knowing when to call for help. This information is not intended to replace advice given to you by your health care provider. Make sure you discuss any questions you have with your health care provider. Document Revised: 12/31/2020 Document Reviewed: 12/31/2020 Elsevier Patient Education  [place].

## 2021-10-30 NOTE — Assessment & Plan Note (Signed)
Patient is well-appearing.  Afebrile.  Benign abdominal exam.  Long discussion of symptoms which most likely suggest a flare of gastritis.  Advised her to stop alcohol, caffeine and meloxicam.  Temporarily we will increase Aciphex to 20 mg twice daily and after couple weeks, she will wean back to prior dosing of Aciphex 20 mg.  She will continue carafate twice daily as she has been taking if she finds helpful.  She will let us know if symptoms persist so we continue further evaluation

## 2021-11-02 DIAGNOSIS — J3089 Other allergic rhinitis: Secondary | ICD-10-CM | POA: Diagnosis not present

## 2021-11-02 DIAGNOSIS — G4723 Circadian rhythm sleep disorder, irregular sleep wake type: Secondary | ICD-10-CM | POA: Diagnosis not present

## 2021-11-02 DIAGNOSIS — G43719 Chronic migraine without aura, intractable, without status migrainosus: Secondary | ICD-10-CM | POA: Diagnosis not present

## 2021-11-02 DIAGNOSIS — G4701 Insomnia due to medical condition: Secondary | ICD-10-CM | POA: Diagnosis not present

## 2021-11-06 ENCOUNTER — Other Ambulatory Visit: Payer: Self-pay | Admitting: Internal Medicine

## 2021-12-04 ENCOUNTER — Other Ambulatory Visit: Payer: Self-pay | Admitting: Internal Medicine

## 2021-12-06 ENCOUNTER — Ambulatory Visit (INDEPENDENT_AMBULATORY_CARE_PROVIDER_SITE_OTHER): Payer: Medicare Other

## 2021-12-06 VITALS — Ht 61.0 in | Wt 121.0 lb

## 2021-12-06 DIAGNOSIS — Z0000 Encounter for general adult medical examination without abnormal findings: Secondary | ICD-10-CM

## 2021-12-06 NOTE — Patient Instructions (Addendum)
?  Brandi Ramos , ?Thank you for taking time to come for your Medicare Wellness Visit. I appreciate your ongoing commitment to your health goals. Please review the following plan we discussed and let me know if I can assist you in the future.  ? ?These are the goals we discussed: ? Goals   ? ?  ? Patient Stated  ?   Follow up with Primary Care Provider (pt-stated)   ?   As needed ?  ?   I want to walk more for exercise (pt-stated)   ? ?  ?  ?This is a list of the screening recommended for you and due dates:  ?Health Maintenance  ?Topic Date Due  ? DEXA scan (bone density measurement)  01/08/2022*  ? Zoster (Shingles) Vaccine (2 of 2) 01/08/2022*  ? Tetanus Vaccine  06/12/2022*  ? COVID-19 Vaccine (4 - Booster for Moderna series) 11/28/2022*  ? Mammogram  06/07/2022  ? Colon Cancer Screening  09/26/2028  ? Pneumonia Vaccine  Completed  ? Flu Shot  Completed  ? Hepatitis C Screening: USPSTF Recommendation to screen - Ages 90-79 yo.  Completed  ? HPV Vaccine  Aged Out  ?*Topic was postponed. The date shown is not the original due date.  ?  ?

## 2021-12-06 NOTE — Progress Notes (Addendum)
Subjective:   Brandi Ramos is a 76 y.o. female who presents for Medicare Annual (Subsequent) preventive examination.  Review of Systems    No ROS.  Medicare Wellness Virtual Visit.  Visual/audio telehealth visit, UTA vital signs.   See social history for additional risk factors.   Cardiac Risk Factors include: advanced age (>56men, >7 women)     Objective:    Today's Vitals   12/06/21 1333  Weight: 121 lb (54.9 kg)  Height: 5\' 1"  (1.549 m)   Body mass index is 22.86 kg/m.     12/06/2021    1:40 PM 04/26/2020    2:16 PM 11/28/2019    1:11 PM 02/04/2019    9:44 AM 10/29/2018    8:54 AM 10/27/2018    1:57 PM 10/27/2018    1:48 PM  Advanced Directives  Does Patient Have a Medical Advance Directive? No No No No No No Yes  Does patient want to make changes to medical advance directive?       No - Patient declined  Would patient like information on creating a medical advance directive? No - Patient declined No - Patient declined  No - Patient declined No - Patient declined No - Patient declined     Current Medications (verified) Outpatient Encounter Medications as of 12/06/2021  Medication Sig   ALPRAZolam (XANAX) 0.25 MG tablet TAKE 1 TABLET BY MOUTH EVERY DAY AS NEEDED (Patient not taking: Reported on 10/30/2021)   busPIRone (BUSPAR) 10 MG tablet Take 1 tablet (10 mg total) by mouth daily. (Patient not taking: Reported on 10/30/2021)   DULoxetine (CYMBALTA) 60 MG capsule Take 1 capsule (60 mg total) by mouth daily.   EMGALITY 120 MG/ML SOAJ every 30 (thirty) days.   Fe Fum-FePoly-Vit C-Vit B3 (INTEGRA) 62.5-62.5-40-3 MG CAPS TAKE 1 CAPSULE BY MOUTH DAILY   fluticasone (FLONASE) 50 MCG/ACT nasal spray Place 2 sprays into both nostrils daily.   loratadine (CLARITIN) 10 MG tablet Take 10 mg by mouth daily.   meloxicam (MOBIC) 15 MG tablet TAKE 1 TABLET BY MOUTH ONCE DAILY WITH A MEAL   ondansetron (ZOFRAN ODT) 4 MG disintegrating tablet Take 1 tablet (4 mg total) by mouth 2 (two)  times daily as needed for nausea or vomiting.   propranolol ER (INDERAL LA) 60 MG 24 hr capsule TK 1 C PO D   RABEprazole (ACIPHEX) 20 MG tablet Take 1 tablet (20 mg total) by mouth 2 (two) times daily before a meal.   rizatriptan (MAXALT) 10 MG tablet Take 10 mg by mouth as needed. May repeat in 2 hours if needed   rosuvastatin (CRESTOR) 5 MG tablet TAKE 1 TABLET BY MOUTH ON MONDAY, WEDNESDAY AND FRIDAY.   sucralfate (CARAFATE) 1 g tablet Take 1 tablet (1 g total) by mouth 2 (two) times daily as needed.   SUMAtriptan (IMITREX) 100 MG tablet    topiramate (TOPAMAX) 50 MG tablet TAKE 1 AND 1/2 TABLET BY MOUTH IN MORNING AND 1 TABLET BY MOUTH IN EVENING   No facility-administered encounter medications on file as of 12/06/2021.    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    Past Surgical History:  Procedure Laterality Date   ABDOMINAL HYSTERECTOMY  1984   COLONOSCOPY     COLONOSCOPY WITH PROPOFOL N/A 09/26/2018   Procedure: COLONOSCOPY WITH PROPOFOL;  Surgeon: Christena Deem, MD;  Location: [facility] ENDOSCOPY;  Service: Endoscopy;  Laterality: N/A;   ESOPHAGOGASTRODUODENOSCOPY (EGD) WITH PROPOFOL N/A 10/31/2015   Procedure: ESOPHAGOGASTRODUODENOSCOPY (EGD) WITH PROPOFOL;  Surgeon: Christena Deem, MD;  Location: [facility] ENDOSCOPY;  Service: Endoscopy;  Laterality: N/A;   ESOPHAGOGASTRODUODENOSCOPY (EGD) WITH PROPOFOL N/A  09/26/2018   Procedure: ESOPHAGOGASTRODUODENOSCOPY (EGD) WITH PROPOFOL;  Surgeon: Christena Deem, MD;  Location: [facility] ENDOSCOPY;  Service: Endoscopy;  Laterality: N/A;   LAPAROSCOPIC APPENDECTOMY N/A 10/29/2018   Procedure: APPENDECTOMY LAPAROSCOPIC;  Surgeon: Carolan Shiver, MD;  Location: [facility] ORS;  Service: General;  Laterality: N/A;   LAPAROSCOPIC BILATERAL SALPINGO OOPHERECTOMY Bilateral 10/29/2018   Procedure: LAPAROSCOPIC BILATERAL SALPINGO OOPHORECTOMY;  Surgeon: Ward, Elenora Fender, MD;  Location: [facility] ORS;  Service: Gynecology;  Laterality: Bilateral;   Family History  Problem Relation Age of Onset   CAD Mother        s/p CABG   Hypercholesterolemia Mother    Breast cancer Mother 42   Heart disease Mother    Lung cancer Mother    Hypertension Father    Migraines Father    Hypercholesterolemia Father    Lung cancer Father    Migraines Sister    Arthritis Sister    Lymphoma Other        grandmother   Colon cancer Neg Hx    Social History   Socioeconomic History   Marital status: Widowed    Spouse name: Not on file   Number of children: 2   Years of education: Not on file   Highest education level: Not on file  Occupational History   Not on file  Tobacco Use   Smoking status: Never   Smokeless tobacco: Never  Vaping Use   Vaping Use: Never used  Substance and Sexual Activity   Alcohol use: Yes    Alcohol/week: 0.0 standard drinks    Comment: wine occassionally   Drug use: No   Sexual activity: Not Currently  Other Topics Concern   Not on file  Social History Narrative   Not on file   Social Determinants of Health   Financial Resource Strain: Low Risk    Difficulty of Paying Living Expenses: Not hard at all  Food Insecurity: No Food Insecurity   Worried About Programme researcher, broadcasting/film/video in the Last Year: Never true   Ran Out of Food in the Last Year: Never true  Transportation Needs: No Transportation Needs   Lack of Transportation (Medical): No   Lack of  Transportation (Non-Medical): No  Physical Activity: Insufficiently Active   Days of Exercise per Week: 1 day   Minutes of Exercise per Session: 40 min  Stress: No Stress Concern Present   Feeling of Stress : Not at all  Social Connections: Moderately Integrated   Frequency of Communication with Friends and Family: More than three times a week   Frequency of Social Gatherings with Friends and Family: More than three times a week  Attends Religious Services: More than 4 times per year   Active Member of Clubs or Organizations: Yes   Attends Banker Meetings: More than 4 times per year   Marital Status: Widowed    Tobacco Counseling Counseling given: Not Answered   Clinical Intake:  Pre-visit preparation completed: Yes        Diabetes: No  How often do you need to have someone help you when you read instructions, pamphlets, or other written materials from your doctor or pharmacy?: 1 - Never   Interpreter Needed?: No      Activities of Daily Living    12/06/2021    1:42 PM  In your present state of health, do you have any difficulty performing the following activities:  Hearing? 0  Vision? 0  Difficulty concentrating or making decisions? 0  Walking or climbing stairs? 0  Dressing or bathing? 0  Doing errands, shopping? 0  Preparing Food and eating ? N  Using the Toilet? N  In the past six months, have you accidently leaked urine? N  Do you have problems with loss of bowel control? N  Managing your Medications? N  Managing your Finances? N  Housekeeping or managing your Housekeeping? N    Patient Care Team: Dale [place], MD as PCP - General (Internal Medicine)  Indicate any recent Medical Services you may have received from other than [facility] providers in the past year (date may be approximate).     Assessment:   This is a routine wellness examination for Brandi Ramos.  Virtual Visit via Telephone Note  I connected with  Brandi Ramos on  12/06/21 at  1:30 PM EDT by telephone and verified that I am speaking with the correct person using two identifiers.  Persons participating in the virtual visit: patient/Nurse Health Advisor   I discussed the limitations of performing an evaluation and management service by telehealth. The patient expressed understanding and agreed to proceed. We continued and completed visit with audio only. Some vital signs may be absent or patient reported.   Hearing/Vision screen Hearing Screening - Comments:: Patient is able to hear conversational tones without difficulty.  No issues reported. Vision Screening - Comments:: Followed by [facility]  Appointment with Dr. Inez Pilgrim Wears corrective lenses They have regular follow up with the ophthalmologist Glaucoma suspect; visits every 6 month  Dietary issues and exercise activities discussed: Current Exercise Habits: Home exercise routine, Type of exercise: walking, Intensity: Mild Regular diet Good water intake   Goals Addressed               This Visit's Progress     Patient Stated     Follow up with Primary Care Provider (pt-stated)        As needed      I want to walk more for exercise (pt-stated)         Depression Screen    12/06/2021    1:37 PM 09/20/2021    8:45 AM 06/12/2021    8:13 AM 03/10/2021    8:13 AM 03/10/2021    7:41 AM 11/21/2020    7:56 AM 08/09/2020   11:43 AM  PHQ 2/9 Scores  PHQ - 2 Score 0 0 0 0 0 2 0  PHQ- 9 Score    1  3 0    Fall Risk    12/06/2021    1:42 PM 10/30/2021    9:05 AM 09/20/2021    8:45 AM 06/12/2021    8:13 AM  03/10/2021    7:41 AM  Fall Risk   Falls in the past year? 0 0 0 0 0  Number falls in past yr: 0 0 0 0 0  Injury with Fall?  0 0 0 0  Risk for fall due to :  No Fall Risks No Fall Risks    Follow up Falls evaluation completed Falls evaluation completed Falls evaluation completed Falls evaluation completed Falls evaluation completed    FALL RISK PREVENTION PERTAINING TO THE  HOME: Home free of loose throw rugs in walkways, pet beds, electrical cords, etc? Yes  Adequate lighting in your home to reduce risk of falls? Yes   ASSISTIVE DEVICES UTILIZED TO PREVENT FALLS: Use of a cane, walker or w/c? No   TIMED UP AND GO: Was the test performed? No .   Cognitive Function: Patient is alert and oriented x3.  Normal cognitive status assessed by direct observation/communication. No abnormalities found.      11/13/2016    9:41 AM  MMSE - Mini Mental State Exam  Orientation to time 5  Orientation to Place 5  Registration 3  Attention/ Calculation 5  Recall 3  Language- name 2 objects 2  Language- repeat 1  Language- follow 3 step command 3  Language- read & follow direction 1  Write a sentence 1  Copy design 1  Total score 30        04/26/2020    2:26 PM 02/04/2019    9:46 AM  6CIT Screen  What Year? 0 points 0 points  What month? 0 points 0 points  What time?  0 points  Count back from 20  0 points  Months in reverse 0 points 0 points  Repeat phrase 0 points 0 points  Total Score  0 points    Immunizations Immunization History  Administered Date(s) Administered   Fluad Quad(high Dose 65+) 07/11/2021   Influenza Split 06/15/2014   Influenza, High Dose Seasonal PF 10/11/2016, 05/17/2017, 06/30/2018, 06/04/2019   Influenza,inj,Quad PF,6+ Mos 07/01/2013, 06/27/2015   Influenza-Unspecified 06/30/2018, 07/11/2020   Moderna Sars-Covid-2 Vaccination 10/19/2019, 11/16/2019, 08/28/2020   Pneumococcal Conjugate-13 11/13/2016   Pneumococcal Polysaccharide-23 08/27/2018   Zoster Recombinat (Shingrix) 09/20/2021   Zoster, Live 05/11/2014   Screening Tests Health Maintenance  Topic Date Due   DEXA SCAN  01/08/2022 (Originally 07/06/2013)   Zoster Vaccines- Shingrix (2 of 2) 01/08/2022 (Originally 11/15/2021)   TETANUS/TDAP  06/12/2022 (Originally 07/07/1965)   COVID-19 Vaccine (4 - Booster for Moderna series) 11/28/2022 (Originally 10/23/2020)    MAMMOGRAM  06/07/2022   COLONOSCOPY (Pts 45-44yrs Insurance coverage will need to be confirmed)  09/26/2028   Pneumonia Vaccine 25+ Years old  Completed   INFLUENZA VACCINE  Completed   Hepatitis C Screening  Completed   HPV VACCINES  Aged Out   Health Maintenance There are no preventive care reminders to display for this patient.  Bone density- deferred for follow up with PCP per patient preference.   Lung Cancer Screening: (Low Dose CT Chest recommended if Age 26-80 years, 30 pack-year currently smoking OR have quit w/in 15years.) does not qualify.   Vision Screening: Recommended annual ophthalmology exams for early detection of glaucoma and other disorders of the eye.  Dental Screening: Recommended annual dental exams for proper oral hygiene  Community Resource Referral / Chronic Care Management: CRR required this visit?  No   CCM required this visit?  No      Plan:   Keep all routine maintenance appointments.   I  have personally reviewed and noted the following in the patient's chart:   Medical and social history Use of alcohol, tobacco or illicit drugs  Current medications and supplements including opioid prescriptions.  Functional ability and status Nutritional status Physical activity Advanced directives List of other physicians Hospitalizations, surgeries, and ER visits in previous 12 months Vitals Screenings to include cognitive, depression, and falls Referrals and appointments  In addition, I have reviewed and discussed with patient certain preventive protocols, quality metrics, and best practice recommendations. A written personalized care plan for preventive services as well as general preventive health recommendations were provided to patient via mychart.     OBrien-Blaney, Haniyyah Sakuma L, LPN   1/61/0960     I have reviewed the above information and agree with above.   Duncan Dull, MD

## 2021-12-18 ENCOUNTER — Ambulatory Visit: Payer: Self-pay | Admitting: Urology

## 2021-12-25 ENCOUNTER — Telehealth: Payer: Self-pay | Admitting: Internal Medicine

## 2021-12-25 DIAGNOSIS — H524 Presbyopia: Secondary | ICD-10-CM | POA: Diagnosis not present

## 2021-12-25 NOTE — Telephone Encounter (Signed)
[employer] called back they will reimburse the patient for her Shingrix injection. Due to her paying for the vaccine  and sending in for reimbursement.  ?

## 2021-12-25 NOTE — Telephone Encounter (Signed)
A Complaint has been filed with [employer] by the patient. The patient stated that she was not informed that her insurance would not be checked to determine if her Shingrix vaccine would be paid for prior to having the vaccine on 09/20/21. The patient was billed $295 for shingles medication . She felt like her benefits should have been discussed prior to the vaccine. She is requesting the charges be removed from her account due to her not being informed. Can the charges be removed? ?

## 2022-01-08 DIAGNOSIS — H40003 Preglaucoma, unspecified, bilateral: Secondary | ICD-10-CM | POA: Diagnosis not present

## 2022-01-09 DIAGNOSIS — G5762 Lesion of plantar nerve, left lower limb: Secondary | ICD-10-CM | POA: Diagnosis not present

## 2022-01-09 DIAGNOSIS — H40003 Preglaucoma, unspecified, bilateral: Secondary | ICD-10-CM | POA: Diagnosis not present

## 2022-01-09 DIAGNOSIS — M79672 Pain in left foot: Secondary | ICD-10-CM | POA: Diagnosis not present

## 2022-01-18 ENCOUNTER — Encounter: Payer: Self-pay | Admitting: Internal Medicine

## 2022-01-18 ENCOUNTER — Ambulatory Visit (INDEPENDENT_AMBULATORY_CARE_PROVIDER_SITE_OTHER): Payer: Medicare Other | Admitting: Internal Medicine

## 2022-01-18 VITALS — BP 110/60 | HR 62 | Temp 98.5°F | Resp 13 | Ht 61.0 in | Wt 120.4 lb

## 2022-01-18 DIAGNOSIS — D696 Thrombocytopenia, unspecified: Secondary | ICD-10-CM

## 2022-01-18 DIAGNOSIS — D649 Anemia, unspecified: Secondary | ICD-10-CM

## 2022-01-18 DIAGNOSIS — D61818 Other pancytopenia: Secondary | ICD-10-CM

## 2022-01-18 DIAGNOSIS — R319 Hematuria, unspecified: Secondary | ICD-10-CM

## 2022-01-18 DIAGNOSIS — F419 Anxiety disorder, unspecified: Secondary | ICD-10-CM

## 2022-01-18 DIAGNOSIS — R002 Palpitations: Secondary | ICD-10-CM

## 2022-01-18 DIAGNOSIS — G43709 Chronic migraine without aura, not intractable, without status migrainosus: Secondary | ICD-10-CM

## 2022-01-18 DIAGNOSIS — I700 Atherosclerosis of aorta: Secondary | ICD-10-CM

## 2022-01-18 DIAGNOSIS — K5900 Constipation, unspecified: Secondary | ICD-10-CM

## 2022-01-18 DIAGNOSIS — Z9109 Other allergy status, other than to drugs and biological substances: Secondary | ICD-10-CM

## 2022-01-18 DIAGNOSIS — K219 Gastro-esophageal reflux disease without esophagitis: Secondary | ICD-10-CM

## 2022-01-18 DIAGNOSIS — E7800 Pure hypercholesterolemia, unspecified: Secondary | ICD-10-CM | POA: Diagnosis not present

## 2022-01-18 DIAGNOSIS — F320 Major depressive disorder, single episode, mild: Secondary | ICD-10-CM

## 2022-01-18 DIAGNOSIS — D72819 Decreased white blood cell count, unspecified: Secondary | ICD-10-CM

## 2022-01-18 LAB — LIPID PANEL
Cholesterol: 148 mg/dL (ref 0–200)
HDL: 64.5 mg/dL (ref >39.00)
LDL Cholesterol: 70 mg/dL (ref 0–99)
NonHDL: 83.37
Total CHOL/HDL Ratio: 2
Triglycerides: 69 mg/dL (ref 0.0–149.0)
VLDL: 13.8 mg/dL (ref 0.0–40.0)

## 2022-01-18 LAB — BASIC METABOLIC PANEL
BUN: 13 mg/dL (ref 6–23)
CO2: 29 mEq/L (ref 19–32)
Calcium: 8.9 mg/dL (ref 8.4–10.5)
Chloride: 106 mEq/L (ref 96–112)
Creatinine, Ser: 0.94 mg/dL (ref 0.40–1.20)
GFR: 60.19 mL/min (ref >60.00)
Glucose, Bld: 100 mg/dL — ABNORMAL HIGH (ref 70–99)
Potassium: 3.7 mEq/L (ref 3.5–5.1)
Sodium: 140 mEq/L (ref 135–145)

## 2022-01-18 LAB — CBC WITH DIFFERENTIAL/PLATELET
Basophils Absolute: 0 10*3/uL (ref 0.0–0.1)
Basophils Relative: 0.6 % (ref 0.0–3.0)
Eosinophils Absolute: 0.1 10*3/uL (ref 0.0–0.7)
Eosinophils Relative: 3.1 % (ref 0.0–5.0)
HCT: 36.7 % (ref 36.0–46.0)
Hemoglobin: 12.2 g/dL (ref 12.0–15.0)
Lymphocytes Relative: 39.3 % (ref 12.0–46.0)
Lymphs Abs: 1.4 10*3/uL (ref 0.7–4.0)
MCHC: 33.2 g/dL (ref 30.0–36.0)
MCV: 89.8 fl (ref 78.0–100.0)
Monocytes Absolute: 0.4 10*3/uL (ref 0.1–1.0)
Monocytes Relative: 10.6 % (ref 3.0–12.0)
Neutro Abs: 1.7 10*3/uL (ref 1.4–7.7)
Neutrophils Relative %: 46.4 % (ref 43.0–77.0)
Platelets: 150 10*3/uL (ref 150.0–400.0)
RBC: 4.08 Mil/uL (ref 3.87–5.11)
RDW: 13.6 % (ref 11.5–15.5)
WBC: 3.7 10*3/uL — ABNORMAL LOW (ref 4.0–10.5)

## 2022-01-18 LAB — HEPATIC FUNCTION PANEL
ALT: 10 U/L (ref 0–35)
AST: 19 U/L (ref 0–37)
Albumin: 4 g/dL (ref 3.5–5.2)
Alkaline Phosphatase: 51 U/L (ref 39–117)
Bilirubin, Direct: 0.1 mg/dL (ref 0.0–0.3)
Total Bilirubin: 0.6 mg/dL (ref 0.2–1.2)
Total Protein: 6.3 g/dL (ref 6.0–8.3)

## 2022-01-18 LAB — FERRITIN: Ferritin: 43.9 ng/mL (ref 10.0–291.0)

## 2022-01-18 MED ORDER — MAGNESIUM OXIDE (ELEMENTAL) 400 MG PO TABS: ORAL_TABLET | ORAL | 2 refills | Status: DC

## 2022-01-18 NOTE — Progress Notes (Signed)
Patient ID: Brandi Ramos, female   DOB: 1946/06/12, 76 y.o.   MRN: [id number]   Subjective:    Patient ID: Brandi Ramos, female    DOB: [birth year], 76 y.o.   MRN: [id number]   Patient here for a scheduled follow up.    HPI Here to follow up regarding increased stress/anxiety, blood counts and depression.  States she is doing relatively well.  Feels handling stress relatively well.  No chest pain or sob reported.  Working out in her yard more.  Does report some persistent intermittent palpations.  Worse in the evening - night.  Has seen Dr Rockey Situ previously.  Request f/u.  Still with some acid reflux.  Taking aciphex q day.  May have break through - one time per week.  No abdominal pain.  Still constipation issue.  Linzess did not help.  Takes dulcolax 2-3x/week.     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    Past Surgical History:  Procedure Laterality Date   ABDOMINAL HYSTERECTOMY  1984   COLONOSCOPY     COLONOSCOPY WITH PROPOFOL N/A 09/26/2018   Procedure: COLONOSCOPY WITH PROPOFOL;  Surgeon: Lollie Sails, MD;  Location: [facility] ENDOSCOPY;  Service: Endoscopy;  Laterality: N/A;   ESOPHAGOGASTRODUODENOSCOPY (EGD) WITH PROPOFOL N/A 10/31/2015   Procedure: ESOPHAGOGASTRODUODENOSCOPY (EGD) WITH PROPOFOL;  Surgeon: Lollie Sails, MD;  Location: [facility] ENDOSCOPY;  Service: Endoscopy;  Laterality: N/A;   ESOPHAGOGASTRODUODENOSCOPY (EGD) WITH PROPOFOL N/A 09/26/2018   Procedure:  ESOPHAGOGASTRODUODENOSCOPY (EGD) WITH PROPOFOL;  Surgeon: Lollie Sails, MD;  Location: [facility] ENDOSCOPY;  Service: Endoscopy;  Laterality: N/A;   LAPAROSCOPIC APPENDECTOMY N/A 10/29/2018   Procedure: APPENDECTOMY LAPAROSCOPIC;  Surgeon: Herbert Pun, MD;  Location: [facility] ORS;  Service: General;  Laterality: N/A;   LAPAROSCOPIC BILATERAL SALPINGO OOPHERECTOMY Bilateral 10/29/2018   Procedure: LAPAROSCOPIC BILATERAL SALPINGO OOPHORECTOMY;  Surgeon: Ward, Honor Loh, MD;  Location: [facility] ORS;  Service: Gynecology;  Laterality: Bilateral;   Family History  Problem Relation Age of Onset   CAD Mother        s/p CABG   Hypercholesterolemia Mother    Breast cancer Mother 86   Heart disease Mother    Lung cancer Mother    Hypertension Father    Migraines Father    Hypercholesterolemia Father    Lung cancer Father    Migraines Sister    Arthritis Sister    Lymphoma Other        grandmother   Colon cancer Neg Hx    Social History   Socioeconomic History   Marital status: Widowed    Spouse name: Not on file   Number of children: 2   Years of education: Not on file   Highest education level: Not on file  Occupational History   Not on file  Tobacco Use   Smoking status: Never  Smokeless tobacco: Never  Vaping Use   Vaping Use: Never used  Substance and Sexual Activity   Alcohol use: Yes    Alcohol/week: 0.0 standard drinks    Comment: wine occassionally   Drug use: No   Sexual activity: Not Currently  Other Topics Concern   Not on file  Social History Narrative   Not on file   Social Determinants of Health   Financial Resource Strain: Low Risk    Difficulty of Paying Living Expenses: Not hard at all  Food Insecurity: No Food Insecurity   Worried About Charity fundraiser in the Last Year: Never true   [place] in the Last Year: Never true  Transportation Needs: No Transportation Needs   Lack of Transportation (Medical): No   Lack of Transportation  (Non-Medical): No  Physical Activity: Insufficiently Active   Days of Exercise per Week: 1 day   Minutes of Exercise per Session: 40 min  Stress: No Stress Concern Present   Feeling of Stress : Not at all  Social Connections: Moderately Integrated   Frequency of Communication with Friends and Family: More than three times a week   Frequency of Social Gatherings with Friends and Family: More than three times a week   Attends Religious Services: More than 4 times per year   Active Member of [employer] or Organizations: Yes   Attends Archivist Meetings: More than 4 times per year   Marital Status: Widowed     Review of Systems  Constitutional:  Negative for appetite change and unexpected weight change.  HENT:  Positive for congestion. Negative for sinus pressure.   Respiratory:  Negative for cough, chest tightness and shortness of breath.   Cardiovascular:  Positive for palpitations. Negative for chest pain and leg swelling.  Gastrointestinal:  Negative for abdominal pain, diarrhea, nausea and vomiting.  Genitourinary:  Negative for difficulty urinating and dysuria.  Musculoskeletal:  Negative for joint swelling and myalgias.  Skin:  Negative for color change and rash.  Neurological:  Negative for dizziness, light-headedness and headaches.  Psychiatric/Behavioral:  Negative for agitation and dysphoric mood.       Objective:     BP 110/60 (BP Location: Left Arm, Patient Position: Sitting, Cuff Size: Small)   Pulse 62   Temp 98.5 F (36.9 C) (Temporal)   Resp 13   Ht '5\' 1"'$  (1.549 m)   Wt 120 lb 6.4 oz (54.6 kg)   SpO2 98%   BMI 22.75 kg/m  Wt Readings from Last 3 Encounters:  01/18/22 120 lb 6.4 oz (54.6 kg)  12/06/21 121 lb (54.9 kg)  10/30/21 121 lb 12.8 oz (55.2 kg)    Physical Exam Vitals reviewed.  Constitutional:      General: She is not in acute distress.    Appearance: Normal appearance.  HENT:     Head: Normocephalic and atraumatic.     Right Ear:  External ear normal.     Left Ear: External ear normal.  Eyes:     General: No scleral icterus.       Right eye: No discharge.        Left eye: No discharge.     Conjunctiva/sclera: Conjunctivae normal.  Neck:     Thyroid: No thyromegaly.  Cardiovascular:     Rate and Rhythm: Normal rate and regular rhythm.  Pulmonary:     Effort: No respiratory distress.     Breath sounds: Normal breath sounds. No wheezing.  Abdominal:  General: Bowel sounds are normal.     Palpations: Abdomen is soft.     Tenderness: There is no abdominal tenderness.  Musculoskeletal:        General: No swelling or tenderness.     Cervical back: Neck supple. No tenderness.  Lymphadenopathy:     Cervical: No cervical adenopathy.  Skin:    Findings: No erythema or rash.  Neurological:     Mental Status: She is alert.  Psychiatric:        Mood and Affect: Mood normal.        Behavior: Behavior normal.     Outpatient Encounter Medications as of 01/18/2022  Medication Sig   DULoxetine (CYMBALTA) 60 MG capsule Take 1 capsule (60 mg total) by mouth daily.   EMGALITY 120 MG/ML SOAJ every 30 (thirty) days.   Fe Fum-FePoly-Vit C-Vit B3 (INTEGRA) 62.5-62.5-40-3 MG CAPS TAKE 1 CAPSULE BY MOUTH DAILY   fluticasone (FLONASE) 50 MCG/ACT nasal spray Place 2 sprays into both nostrils daily.   loratadine (CLARITIN) 10 MG tablet Take 10 mg by mouth daily.   Magnesium Oxide, Elemental, 400 MG TABS Take one tablet per day   meloxicam (MOBIC) 15 MG tablet TAKE 1 TABLET BY MOUTH ONCE DAILY WITH A MEAL   ondansetron (ZOFRAN ODT) 4 MG disintegrating tablet Take 1 tablet (4 mg total) by mouth 2 (two) times daily as needed for nausea or vomiting.   propranolol ER (INDERAL LA) 60 MG 24 hr capsule TK 1 C PO D   RABEprazole (ACIPHEX) 20 MG tablet Take 1 tablet (20 mg total) by mouth 2 (two) times daily before a meal.   rizatriptan (MAXALT) 10 MG tablet Take 10 mg by mouth as needed. May repeat in 2 hours if needed   rosuvastatin  (CRESTOR) 5 MG tablet TAKE 1 TABLET BY MOUTH ON MONDAY, WEDNESDAY AND FRIDAY.   sucralfate (CARAFATE) 1 g tablet Take 1 tablet (1 g total) by mouth 2 (two) times daily as needed.   SUMAtriptan (IMITREX) 100 MG tablet    topiramate (TOPAMAX) 50 MG tablet TAKE 1 AND 1/2 TABLET BY MOUTH IN MORNING AND 1 TABLET BY MOUTH IN EVENING   [DISCONTINUED] ALPRAZolam (XANAX) 0.25 MG tablet TAKE 1 TABLET BY MOUTH EVERY DAY AS NEEDED (Patient not taking: Reported on 10/30/2021)   [DISCONTINUED] busPIRone (BUSPAR) 10 MG tablet Take 1 tablet (10 mg total) by mouth daily. (Patient not taking: Reported on 10/30/2021)   No facility-administered encounter medications on file as of 01/18/2022.     Lab Results  Component Value Date   WBC 3.7 (L) 01/18/2022   HGB 12.2 01/18/2022   HCT 36.7 01/18/2022   PLT 150.0 01/18/2022   GLUCOSE 100 (H) 01/18/2022   CHOL 148 01/18/2022   TRIG 69.0 01/18/2022   HDL 64.50 01/18/2022   LDLCALC 70 01/18/2022   ALT 10 01/18/2022   AST 19 01/18/2022   NA 140 01/18/2022   K 3.7 01/18/2022   CL 106 01/18/2022   CREATININE 0.94 01/18/2022   BUN 13 01/18/2022   CO2 29 01/18/2022   TSH 3.59 06/12/2021   HGBA1C 5.5 01/15/2018    MM 3D SCREEN BREAST BILATERAL  Result Date: 06/13/2021 CLINICAL DATA:  Screening. EXAM: DIGITAL SCREENING BILATERAL MAMMOGRAM WITH TOMOSYNTHESIS AND CAD TECHNIQUE: Bilateral screening digital craniocaudal and mediolateral oblique mammograms were obtained. Bilateral screening digital breast tomosynthesis was performed. The images were evaluated with computer-aided detection. COMPARISON:  Previous exam(s). ACR Breast Density Category c: The breast tissue is heterogeneously dense,  which may obscure small masses. FINDINGS: There are no findings suspicious for malignancy. IMPRESSION: No mammographic evidence of malignancy. A result letter of this screening mammogram will be mailed directly to the patient. RECOMMENDATION: Screening mammogram in one year.  (Code:SM-B-01Y) BI-RADS CATEGORY  1: Negative. Electronically Signed   By: Ammie Ferrier M.D.   On: 06/13/2021 14:36      Assessment & Plan:   Problem List Items Addressed This Visit     Anxiety    Continue cymbalta and buspar.  Follow.        Aortic atherosclerosis (HCC)    Continue crestor.        Constipation    Takes dulcolax.  Intolerant to miralax.  Makes her feel bloated.  Has seen GI.  Linzess helped, but cost is an issue.        Environmental allergies    Singulair.  Stable.  Follow.        GERD (gastroesophageal reflux disease)    On Aciphex. May have break through symptoms one time per week.  Add pepcid in the evening.  Follow.        Hematuria    Being followed by urology as outlined.  Being followed for kidney stone.  They recommended follow-up in 1 year with KUB and urinalysis.       Hypercholesterolemia    Continue Crestor.  Low-cholesterol diet and exercise.  Follow lipid panel liver function test.       Relevant Orders   Lipid Profile (Completed)   Hepatic function panel (Completed)   Basic Metabolic Panel (BMET) (Completed)   Leukopenia    Has had persistent decreased white blood cell count/pancytopenia.  Discussed with hematology.  They recommended continuing to follow.       Major depressive disorder, single episode, mild (HCC)    Continue trazodone, cymbalta and buspar.  Stable.  Follow.        Migraine headache    Followed by neurology.  On topamax and propranolol.  Has aimovig.  Stable.  Follow.        Palpitations    Saw cardiology 12/2020.  Recommended if persistent problems, can place zio monitor.  Has noticed persistent intermittent palpitations.  Off magnesium.  Felt did not help.  Discussed f/u EKG today.  Plans to f/u with cardiology.  Request f/u with Dr Rockey Situ.        Pancytopenia [facility])    Discussed with hematology.  Recommended f/u cbc.        Thrombocytopenia ([place]) - Primary    Have discussed with  hematology.  Follow cbc.        Relevant Orders   CBC w/Diff (Completed)     Einar Pheasant, MD

## 2022-01-18 NOTE — Patient Instructions (Signed)
Pepcid (famotidin) '20mg'$  - take one tablet 30 minutes before your evening meal.  ?

## 2022-01-26 DIAGNOSIS — F410 Panic disorder [episodic paroxysmal anxiety] without agoraphobia: Secondary | ICD-10-CM | POA: Diagnosis not present

## 2022-01-26 DIAGNOSIS — F3341 Major depressive disorder, recurrent, in partial remission: Secondary | ICD-10-CM | POA: Diagnosis not present

## 2022-01-26 DIAGNOSIS — F411 Generalized anxiety disorder: Secondary | ICD-10-CM | POA: Diagnosis not present

## 2022-01-28 ENCOUNTER — Encounter: Payer: Self-pay | Admitting: Internal Medicine

## 2022-01-28 ENCOUNTER — Telehealth: Payer: Self-pay | Admitting: Internal Medicine

## 2022-01-28 DIAGNOSIS — I700 Atherosclerosis of aorta: Secondary | ICD-10-CM | POA: Insufficient documentation

## 2022-01-28 DIAGNOSIS — F320 Major depressive disorder, single episode, mild: Secondary | ICD-10-CM | POA: Insufficient documentation

## 2022-01-28 NOTE — Assessment & Plan Note (Signed)
Continue cymbalta and buspar.  Follow.  

## 2022-01-28 NOTE — Assessment & Plan Note (Signed)
Have discussed with hematology.  Follow cbc.  

## 2022-01-28 NOTE — Assessment & Plan Note (Signed)
Continue trazodone, cymbalta and buspar.  Stable.  Follow.  

## 2022-01-28 NOTE — Assessment & Plan Note (Signed)
Continue crestor 

## 2022-01-28 NOTE — Assessment & Plan Note (Signed)
Discussed with hematology.  Recommended f/u cbc.

## 2022-01-28 NOTE — Assessment & Plan Note (Signed)
Continue Crestor.  Low-cholesterol diet and exercise.  Follow lipid panel liver function test. 

## 2022-01-28 NOTE — Telephone Encounter (Signed)
Brandi Ramos saw Dr Rockey Situ for palpitations.  She continues to have palpitations. Request f/u appt with Dr Rockey Situ. Please call and schedule f/u appt.

## 2022-01-28 NOTE — Assessment & Plan Note (Signed)
Singulair.  Stable.  Follow.  

## 2022-01-28 NOTE — Assessment & Plan Note (Addendum)
On Aciphex. May have break through symptoms one time per week.  Add pepcid in the evening.  Follow.

## 2022-01-28 NOTE — Assessment & Plan Note (Signed)
Takes dulcolax.  Intolerant to miralax.  Makes her feel bloated.  Has seen GI.  Linzess helped, but cost is an issue.

## 2022-01-28 NOTE — Assessment & Plan Note (Signed)
Being followed by urology as outlined.  Being followed for kidney stone.  They recommended follow-up in 1 year with KUB and urinalysis. 

## 2022-01-28 NOTE — Assessment & Plan Note (Signed)
Saw cardiology 12/2020.  Recommended if persistent problems, can place zio monitor.  Has noticed persistent intermittent palpitations.  Off magnesium.  Felt did not help.  Discussed f/u EKG today.  Plans to f/u with cardiology.  Request f/u with Dr Rockey Situ.

## 2022-01-28 NOTE — Assessment & Plan Note (Signed)
Followed by neurology.  On topamax and propranolol.  Has aimovig.  Stable.  Follow.  

## 2022-01-28 NOTE — Assessment & Plan Note (Signed)
Has had persistent decreased white blood cell count/pancytopenia.  Discussed with hematology.  They recommended continuing to follow. 

## 2022-02-13 DIAGNOSIS — G43719 Chronic migraine without aura, intractable, without status migrainosus: Secondary | ICD-10-CM | POA: Diagnosis not present

## 2022-02-13 DIAGNOSIS — G4701 Insomnia due to medical condition: Secondary | ICD-10-CM | POA: Diagnosis not present

## 2022-02-13 DIAGNOSIS — G4723 Circadian rhythm sleep disorder, irregular sleep wake type: Secondary | ICD-10-CM | POA: Diagnosis not present

## 2022-02-13 DIAGNOSIS — J3089 Other allergic rhinitis: Secondary | ICD-10-CM | POA: Diagnosis not present

## 2022-02-26 DIAGNOSIS — M5416 Radiculopathy, lumbar region: Secondary | ICD-10-CM | POA: Diagnosis not present

## 2022-02-28 DIAGNOSIS — M5416 Radiculopathy, lumbar region: Secondary | ICD-10-CM | POA: Diagnosis not present

## 2022-03-02 ENCOUNTER — Ambulatory Visit: Payer: Medicare Other | Admitting: Urology

## 2022-03-02 DIAGNOSIS — M5416 Radiculopathy, lumbar region: Secondary | ICD-10-CM | POA: Diagnosis not present

## 2022-03-07 DIAGNOSIS — M5417 Radiculopathy, lumbosacral region: Secondary | ICD-10-CM | POA: Diagnosis not present

## 2022-03-15 ENCOUNTER — Other Ambulatory Visit: Payer: Self-pay

## 2022-03-15 ENCOUNTER — Telehealth: Payer: Self-pay | Admitting: Internal Medicine

## 2022-03-15 MED ORDER — DULOXETINE HCL 60 MG PO CPEP: 60.0000 mg | ORAL_CAPSULE | Freq: Every day | ORAL | 1 refills | Status: DC

## 2022-03-15 NOTE — Telephone Encounter (Signed)
Pt need refill on DULoxetine sent to [facility]

## 2022-03-15 NOTE — Telephone Encounter (Signed)
sent 

## 2022-03-21 ENCOUNTER — Ambulatory Visit: Payer: Medicare Other | Admitting: Internal Medicine

## 2022-03-22 DIAGNOSIS — Z85828 Personal history of other malignant neoplasm of skin: Secondary | ICD-10-CM | POA: Diagnosis not present

## 2022-03-22 DIAGNOSIS — D2262 Melanocytic nevi of left upper limb, including shoulder: Secondary | ICD-10-CM | POA: Diagnosis not present

## 2022-03-22 DIAGNOSIS — X32XXXA Exposure to sunlight, initial encounter: Secondary | ICD-10-CM | POA: Diagnosis not present

## 2022-03-22 DIAGNOSIS — M5459 Other low back pain: Secondary | ICD-10-CM | POA: Diagnosis not present

## 2022-03-22 DIAGNOSIS — D225 Melanocytic nevi of trunk: Secondary | ICD-10-CM | POA: Diagnosis not present

## 2022-03-22 DIAGNOSIS — M5416 Radiculopathy, lumbar region: Secondary | ICD-10-CM | POA: Diagnosis not present

## 2022-04-03 DIAGNOSIS — M5416 Radiculopathy, lumbar region: Secondary | ICD-10-CM | POA: Diagnosis not present

## 2022-04-03 DIAGNOSIS — M5459 Other low back pain: Secondary | ICD-10-CM | POA: Diagnosis not present

## 2022-04-05 ENCOUNTER — Encounter: Payer: Self-pay | Admitting: Internal Medicine

## 2022-04-05 ENCOUNTER — Other Ambulatory Visit: Payer: Self-pay | Admitting: Internal Medicine

## 2022-04-05 ENCOUNTER — Ambulatory Visit (INDEPENDENT_AMBULATORY_CARE_PROVIDER_SITE_OTHER): Payer: Medicare Other | Admitting: Internal Medicine

## 2022-04-05 DIAGNOSIS — F320 Major depressive disorder, single episode, mild: Secondary | ICD-10-CM

## 2022-04-05 DIAGNOSIS — I700 Atherosclerosis of aorta: Secondary | ICD-10-CM | POA: Diagnosis not present

## 2022-04-05 DIAGNOSIS — K219 Gastro-esophageal reflux disease without esophagitis: Secondary | ICD-10-CM | POA: Diagnosis not present

## 2022-04-05 DIAGNOSIS — D61818 Other pancytopenia: Secondary | ICD-10-CM | POA: Diagnosis not present

## 2022-04-05 DIAGNOSIS — G43709 Chronic migraine without aura, not intractable, without status migrainosus: Secondary | ICD-10-CM | POA: Diagnosis not present

## 2022-04-05 DIAGNOSIS — K5900 Constipation, unspecified: Secondary | ICD-10-CM

## 2022-04-05 DIAGNOSIS — F419 Anxiety disorder, unspecified: Secondary | ICD-10-CM

## 2022-04-05 DIAGNOSIS — E7800 Pure hypercholesterolemia, unspecified: Secondary | ICD-10-CM

## 2022-04-05 DIAGNOSIS — R319 Hematuria, unspecified: Secondary | ICD-10-CM

## 2022-04-05 MED ORDER — MAGNESIUM OXIDE (ELEMENTAL) 400 MG PO TABS: ORAL_TABLET | ORAL | 3 refills | Status: DC

## 2022-04-05 NOTE — Progress Notes (Signed)
Patient ID: Brandi Ramos, female   DOB: [birth year], 76 y.o.   MRN: [id number]   Subjective:    Patient ID: Brandi Ramos, female    DOB: [birth year], 76 y.o.   MRN: [id number]   Patient here for a scheduled follow up .   HPI Here to follow up increased stress, leukopenia and reflux.  Has been seen - ortho - lumbar radiculopathy.  Has f/u tomorrow.  Left low back and left knee - issues. Going up stairs - worse.  Discussed PT.  No chest pain.  Breathing stable.  No increased acid reflux.  No abdominal pain.  Still issues with constipation.  On integra.  Taking magnesium.     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    Past Surgical History:  Procedure Laterality Date   ABDOMINAL HYSTERECTOMY  1984   COLONOSCOPY     COLONOSCOPY WITH PROPOFOL N/A 09/26/2018   Procedure: COLONOSCOPY WITH PROPOFOL;  Surgeon: Lollie Sails, MD;  Location: [facility] ENDOSCOPY;  Service: Endoscopy;  Laterality: N/A;   ESOPHAGOGASTRODUODENOSCOPY (EGD) WITH PROPOFOL N/A 10/31/2015   Procedure: ESOPHAGOGASTRODUODENOSCOPY (EGD) WITH PROPOFOL;  Surgeon: Lollie Sails, MD;  Location: [facility] ENDOSCOPY;  Service: Endoscopy;  Laterality: N/A;   ESOPHAGOGASTRODUODENOSCOPY (EGD) WITH PROPOFOL N/A 09/26/2018   Procedure: ESOPHAGOGASTRODUODENOSCOPY (EGD) WITH PROPOFOL;  Surgeon: Lollie Sails, MD;  Location: [facility] ENDOSCOPY;  Service: Endoscopy;  Laterality: N/A;   LAPAROSCOPIC APPENDECTOMY N/A 10/29/2018   Procedure:  APPENDECTOMY LAPAROSCOPIC;  Surgeon: Herbert Pun, MD;  Location: [facility] ORS;  Service: General;  Laterality: N/A;   LAPAROSCOPIC BILATERAL SALPINGO OOPHERECTOMY Bilateral 10/29/2018   Procedure: LAPAROSCOPIC BILATERAL SALPINGO OOPHORECTOMY;  Surgeon: Ward, Honor Loh, MD;  Location: [facility] ORS;  Service: Gynecology;  Laterality: Bilateral;   Family History  Problem Relation Age of Onset   CAD Mother        s/p CABG   Hypercholesterolemia Mother    Breast cancer Mother 7   Heart disease Mother    Lung cancer Mother    Hypertension Father    Migraines Father    Hypercholesterolemia Father    Lung cancer Father    Migraines Sister    Arthritis Sister    Lymphoma Other        grandmother   Colon cancer Neg Hx    Social History   Socioeconomic History   Marital status: Widowed    Spouse name: Not on file   Number of children: 2   Years of education: Not on file   Highest education level: Not on file  Occupational History   Not on file  Tobacco Use   Smoking status: Never   Smokeless tobacco: Never  Vaping Use   Vaping Use: Never used  Substance and Sexual Activity   Alcohol use: Yes    Alcohol/week: 0.0 standard drinks of alcohol  Comment: wine occassionally   Drug use: No   Sexual activity: Not Currently  Other Topics Concern   Not on file  Social History Narrative   Not on file   Social Determinants of Health   Financial Resource Strain: Low Risk  (12/06/2021)   Overall Financial Resource Strain (CARDIA)    Difficulty of Paying Living Expenses: Not hard at all  Food Insecurity: No Food Insecurity (12/06/2021)   Hunger Vital Sign    Worried About Running Out of Food in the Last Year: Never true    Ran Out of Food in the Last Year: Never true  Transportation Needs: No Transportation Needs (12/06/2021)   PRAPARE - Hydrologist (Medical): No    Lack of Transportation (Non-Medical): No  Physical Activity: Insufficiently Active  (12/06/2021)   Exercise Vital Sign    Days of Exercise per Week: 1 day    Minutes of Exercise per Session: 40 min  Stress: No Stress Concern Present (12/06/2021)   [place]    Feeling of Stress : Not at all  Social Connections: Moderately Integrated (12/06/2021)   Social Connection and Isolation Panel [NHANES]    Frequency of Communication with Friends and Family: More than three times a week    Frequency of Social Gatherings with Friends and Family: More than three times a week    Attends Religious Services: More than 4 times per year    Active Member of [employer] or Organizations: Yes    Attends Archivist Meetings: More than 4 times per year    Marital Status: Widowed     Review of Systems  Constitutional:  Negative for appetite change and unexpected weight change.  HENT:  Negative for congestion and sinus pressure.   Respiratory:  Negative for cough, chest tightness and shortness of breath.   Cardiovascular:  Negative for chest pain, palpitations and leg swelling.  Gastrointestinal:  Positive for constipation. Negative for abdominal pain, diarrhea, nausea and vomiting.  Genitourinary:  Negative for difficulty urinating and dysuria.  Musculoskeletal:  Negative for joint swelling and myalgias.  Skin:  Negative for color change and rash.  Neurological:  Negative for dizziness, light-headedness and headaches.  Psychiatric/Behavioral:  Negative for agitation and dysphoric mood.        Objective:     BP 120/74 (BP Location: Left Arm, Patient Position: Sitting, Cuff Size: Small)   Pulse 72   Temp 97.8 F (36.6 C) (Temporal)   Resp 16   Ht '5\' 1"'$  (1.549 m)   Wt 119 lb 12.8 oz (54.3 kg)   SpO2 100%   BMI 22.64 kg/m  Wt Readings from Last 3 Encounters:  04/05/22 119 lb 12.8 oz (54.3 kg)  01/18/22 120 lb 6.4 oz (54.6 kg)  12/06/21 121 lb (54.9 kg)    Physical Exam Vitals reviewed.  Constitutional:       General: She is not in acute distress.    Appearance: Normal appearance.  HENT:     Head: Normocephalic and atraumatic.     Right Ear: External ear normal.     Left Ear: External ear normal.  Eyes:     General: No scleral icterus.       Right eye: No discharge.        Left eye: No discharge.     Conjunctiva/sclera: Conjunctivae normal.  Neck:     Thyroid: No thyromegaly.  Cardiovascular:     Rate and Rhythm:  Normal rate and regular rhythm.  Pulmonary:     Effort: No respiratory distress.     Breath sounds: Normal breath sounds. No wheezing.  Abdominal:     General: Bowel sounds are normal.     Palpations: Abdomen is soft.     Tenderness: There is no abdominal tenderness.  Musculoskeletal:        General: No swelling or tenderness.     Cervical back: Neck supple. No tenderness.  Lymphadenopathy:     Cervical: No cervical adenopathy.  Skin:    Findings: No erythema or rash.  Neurological:     Mental Status: She is alert.  Psychiatric:        Mood and Affect: Mood normal.        Behavior: Behavior normal.      Outpatient Encounter Medications as of 04/05/2022  Medication Sig   DULoxetine (CYMBALTA) 60 MG capsule Take 1 capsule (60 mg total) by mouth daily.   fluticasone (FLONASE) 50 MCG/ACT nasal spray Place 2 sprays into both nostrils daily.   loratadine (CLARITIN) 10 MG tablet Take 10 mg by mouth daily.   ondansetron (ZOFRAN ODT) 4 MG disintegrating tablet Take 1 tablet (4 mg total) by mouth 2 (two) times daily as needed for nausea or vomiting.   propranolol ER (INDERAL LA) 60 MG 24 hr capsule TK 1 C PO D   RABEprazole (ACIPHEX) 20 MG tablet Take 1 tablet (20 mg total) by mouth 2 (two) times daily before a meal.   rizatriptan (MAXALT) 10 MG tablet Take 10 mg by mouth as needed. May repeat in 2 hours if needed   rosuvastatin (CRESTOR) 5 MG tablet TAKE 1 TABLET BY MOUTH ON MONDAY, WEDNESDAY AND FRIDAY.   sucralfate (CARAFATE) 1 g tablet Take 1 tablet (1 g total) by mouth  2 (two) times daily as needed.   SUMAtriptan (IMITREX) 100 MG tablet    topiramate (TOPAMAX) 50 MG tablet TAKE 1 AND 1/2 TABLET BY MOUTH IN MORNING AND 1 TABLET BY MOUTH IN EVENING   [DISCONTINUED] EMGALITY 120 MG/ML SOAJ every 30 (thirty) days.   [DISCONTINUED] Fe Fum-FePoly-Vit C-Vit B3 (INTEGRA) 62.5-62.5-40-3 MG CAPS TAKE 1 CAPSULE BY MOUTH DAILY   [DISCONTINUED] Magnesium Oxide, Elemental, 400 MG TABS Take one tablet per day   [DISCONTINUED] meloxicam (MOBIC) 15 MG tablet TAKE 1 TABLET BY MOUTH ONCE DAILY WITH A MEAL   Magnesium Oxide, Elemental, 400 MG TABS Take one tablet per day   No facility-administered encounter medications on file as of 04/05/2022.     Lab Results  Component Value Date   WBC 3.7 (L) 01/18/2022   HGB 12.2 01/18/2022   HCT 36.7 01/18/2022   PLT 150.0 01/18/2022   GLUCOSE 100 (H) 01/18/2022   CHOL 148 01/18/2022   TRIG 69.0 01/18/2022   HDL 64.50 01/18/2022   LDLCALC 70 01/18/2022   ALT 10 01/18/2022   AST 19 01/18/2022   NA 140 01/18/2022   K 3.7 01/18/2022   CL 106 01/18/2022   CREATININE 0.94 01/18/2022   BUN 13 01/18/2022   CO2 29 01/18/2022   TSH 3.59 06/12/2021   HGBA1C 5.5 01/15/2018    MM 3D SCREEN BREAST BILATERAL  Result Date: 06/13/2021 CLINICAL DATA:  Screening. EXAM: DIGITAL SCREENING BILATERAL MAMMOGRAM WITH TOMOSYNTHESIS AND CAD TECHNIQUE: Bilateral screening digital craniocaudal and mediolateral oblique mammograms were obtained. Bilateral screening digital breast tomosynthesis was performed. The images were evaluated with computer-aided detection. COMPARISON:  Previous exam(s). ACR Breast Density Category c: The breast  tissue is heterogeneously dense, which may obscure small masses. FINDINGS: There are no findings suspicious for malignancy. IMPRESSION: No mammographic evidence of malignancy. A result letter of this screening mammogram will be mailed directly to the patient. RECOMMENDATION: Screening mammogram in one year. (Code:SM-B-01Y)  BI-RADS CATEGORY  1: Negative. Electronically Signed   By: Ammie Ferrier M.D.   On: 06/13/2021 14:36      Assessment & Plan:   Problem List Items Addressed This Visit     Anxiety    Continue cymbalta and buspar.  Follow.       Aortic atherosclerosis (HCC)    Continue crestor.       Constipation    Takes dulcolax.  Intolerant to miralax.  Makes her feel bloated.  Has seen GI.  Linzess helped, but cost is an issue. Continue f/u with GI.       GERD (gastroesophageal reflux disease)    On Aciphex. Stable. Follow.       Hematuria    Being followed by urology as outlined.  Being followed for kidney stone.  They recommended follow-up in 1 year with KUB and urinalysis.      Hypercholesterolemia    Continue Crestor.  Low-cholesterol diet and exercise.  Follow lipid panel liver function test.      Relevant Orders   Hepatic function panel   Basic metabolic panel   Lipid panel   TSH   Major depressive disorder, single episode, mild (HCC)    Continue trazodone, cymbalta and buspar.  Stable.  Follow.       Migraine headache    Followed by neurology.  On topamax and propranolol.  Has aimovig.  Stable.  Follow.       Pancytopenia ([place])    Have discussed with hematology.  Follow cbc.       Relevant Orders   CBC with Differential/Platelet   Ferritin     Einar Pheasant, MD

## 2022-04-06 DIAGNOSIS — M5416 Radiculopathy, lumbar region: Secondary | ICD-10-CM | POA: Diagnosis not present

## 2022-04-06 DIAGNOSIS — M5417 Radiculopathy, lumbosacral region: Secondary | ICD-10-CM | POA: Diagnosis not present

## 2022-04-06 DIAGNOSIS — M5459 Other low back pain: Secondary | ICD-10-CM | POA: Diagnosis not present

## 2022-04-08 ENCOUNTER — Encounter: Payer: Self-pay | Admitting: Internal Medicine

## 2022-04-08 NOTE — Assessment & Plan Note (Signed)
Followed by neurology.  On topamax and propranolol.  Has aimovig.  Stable.  Follow.  

## 2022-04-08 NOTE — Assessment & Plan Note (Signed)
Continue Crestor.  Low-cholesterol diet and exercise.  Follow lipid panel liver function test. 

## 2022-04-08 NOTE — Assessment & Plan Note (Signed)
On Aciphex. Stable. Follow.  

## 2022-04-08 NOTE — Assessment & Plan Note (Signed)
Continue trazodone, cymbalta and buspar.  Stable.  Follow.  

## 2022-04-08 NOTE — Assessment & Plan Note (Signed)
Continue cymbalta and buspar.  Follow.  

## 2022-04-08 NOTE — Assessment & Plan Note (Signed)
Being followed by urology as outlined.  Being followed for kidney stone.  They recommended follow-up in 1 year with KUB and urinalysis. 

## 2022-04-08 NOTE — Assessment & Plan Note (Signed)
Takes dulcolax.  Intolerant to miralax.  Makes her feel bloated.  Has seen GI.  Linzess helped, but cost is an issue. Continue f/u with GI.

## 2022-04-08 NOTE — Assessment & Plan Note (Signed)
Have discussed with hematology.  Follow cbc.  

## 2022-04-08 NOTE — Assessment & Plan Note (Signed)
Continue crestor 

## 2022-04-09 NOTE — Progress Notes (Unsigned)
11/21/2020 10:42 AM   Brandi Ramos [birth year] [id number]  Referring provider: Einar Pheasant, [address] [place],  [place] [address]  Urological history: 1. High risk hematuria - Non-smoker - CTU 01/2018 Nonobstructive right nephrolithiasis includes a 3 mm upper pole calculus on image 73/5; an 8 mm in long axis lower pole calculus on image 63/5; and a 4 mm lower pole calculus on image 66/5.  Left-sided renal calculi include a 1-2 mm upper pole nonobstructive calculus and a 1-2 mm lower pole nonobstructive calculus. There is also faint calcification along the left renal capsule on image 60/5 medially.  Some cortical hypodensity in thinning in the left mid upper kidney adjacent to the spleen. Given the slightly scalloped contour I favor that this is probably from remote prior scarring, and a similar appearance was present on 11/16/2015 - Cysto 02/2018 with Dr. Erlene Quan was negative -Contrast CT in 10/2018 Nonobstructing calculus in the lower pole of the right kidney measuring up to 7 mm on image 47/4. There are probable additional tiny renal calculi -Cystoscopy with Dr. Bernardo Heater in 11/2019 was NED -no reports of gross heme - UA neg for micro heme  2. Nephrolithiasis - Contrast CT in 10/2018 noted there is a nonobstructing calculus in the lower pole of the right kidney measuring up to 7 mm on image 47/4. There are probable additional tiny renal calculi - Contrast CT in 10/2020 7 mm stone noted within the inferior pole of the right kidney. - KUB, 02/2021 - stable right renal calculi 8 mm   3. OAB -contributing factors of age, vaginal atrophy, depression, anxiety -PVR 0 mL   Chief Complaint  Patient presents with   Nephrolithiasis    HPI: Brandi Ramos is a 76 y.o. female who presents today for a 12 month follow up.    On her OAB questionnaire she is having 1-7 daytime urinations, 1-2 nighttime urinations with a mild urge to urinate.  She has an  occasional episode of urge incontinence, but she does not have to use incontinence pads for this.  She does experience a weak urinary stream.  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     Surgical History: Past Surgical History:  Procedure Laterality Date   ABDOMINAL HYSTERECTOMY  1984   COLONOSCOPY     COLONOSCOPY WITH PROPOFOL N/A 09/26/2018   Procedure: COLONOSCOPY WITH PROPOFOL;  Surgeon: Lollie Sails, MD;  Location[facility] ENDOSCOPY;  Service: Endoscopy;  Laterality: N/A;   ESOPHAGOGASTRODUODENOSCOPY (EGD) WITH PROPOFOL N/A 10/31/2015   Procedure: ESOPHAGOGASTRODUODENOSCOPY (EGD) WITH PROPOFOL;  Surgeon: Lollie Sails, MD;  Location[facility] ENDOSCOPY;  Service: Endoscopy;  Laterality: N/A;   ESOPHAGOGASTRODUODENOSCOPY (EGD) WITH PROPOFOL N/A 09/26/2018   Procedure: ESOPHAGOGASTRODUODENOSCOPY (EGD) WITH PROPOFOL;  Surgeon: Lollie Sails, MD;  Location: [facility] ENDOSCOPY;  Service: Endoscopy;  Laterality: N/A;   LAPAROSCOPIC APPENDECTOMY N/A 10/29/2018   Procedure: APPENDECTOMY LAPAROSCOPIC;  Surgeon: Herbert Pun, MD;  Location: [facility] ORS;  Service: General;  Laterality: N/A;   LAPAROSCOPIC BILATERAL SALPINGO OOPHERECTOMY Bilateral 10/29/2018   Procedure: LAPAROSCOPIC BILATERAL SALPINGO OOPHORECTOMY;  Surgeon: Ward, Honor Loh, MD;  Location: [facility] ORS;  Service: Gynecology;  Laterality: Bilateral;    Home Medications:   Allergies as of 04/10/2022       Reactions   Amitiza [lubiprostone] Other (See Comments)   Sulfa Antibiotics         Medication List        Accurate as of April 10, 2022 10:42 AM. If you have any questions, ask your nurse or doctor.          DULoxetine 60 MG capsule Commonly known as: CYMBALTA Take 1 capsule (60 mg total) by mouth daily.   fluticasone 50 MCG/ACT nasal spray Commonly known as: FLONASE Place 2 sprays into both nostrils daily.   Integra 62.5-62.5-40-3 MG Caps TAKE 1 CAPSULE BY MOUTH DAILY   loratadine 10 MG tablet Commonly known as: CLARITIN Take 10 mg by mouth daily.   Magnesium Oxide (Elemental) 400 MG Tabs Take one tablet per day   ondansetron 4 MG disintegrating tablet Commonly known as: Zofran ODT Take 1 tablet (4 mg total) by mouth 2 (two) times daily as needed for nausea or vomiting.   propranolol ER 60 MG 24 hr capsule Commonly known as: INDERAL LA TK 1 C PO D   RABEprazole 20 MG tablet Commonly known as: ACIPHEX Take 1 tablet (20 mg total) by mouth 2 (two) times daily before a meal.   rizatriptan 10 MG tablet Commonly known as: MAXALT Take 10 mg by mouth as needed. May repeat in 2 hours if needed   rosuvastatin 5 MG tablet Commonly known as: CRESTOR TAKE 1 TABLET BY MOUTH ON MONDAY, WEDNESDAY AND FRIDAY.   sucralfate 1 g tablet Commonly known as: Carafate Take 1 tablet (1 g total) by mouth 2 (two) times daily as needed.   SUMAtriptan 100 MG tablet Commonly known as: IMITREX   topiramate 50 MG tablet Commonly known as: TOPAMAX TAKE 1 AND 1/2 TABLET BY MOUTH IN MORNING AND 1 TABLET BY MOUTH IN EVENING        Allergies:  Allergies  Allergen Reactions   Amitiza [Lubiprostone] Other (See Comments)   Sulfa Antibiotics     Family History: Family History  Problem Relation Age of Onset   CAD Mother        s/p CABG   Hypercholesterolemia Mother    Breast cancer Mother 70   Heart disease Mother    Lung cancer Mother     Hypertension Father    Migraines Father    Hypercholesterolemia Father    Lung cancer Father    Migraines Sister    Arthritis Sister    Lymphoma Other        grandmother   Colon cancer Neg Hx     Social History:  reports that she has never smoked. She has never used smokeless tobacco. She reports current alcohol use. She reports that she does not use drugs.   Physical Exam: BP 123/69   Pulse 66   Ht '5\' 1"'$  (1.549 m)   Wt 119 lb (54 kg)   BMI 22.48 kg/m   Constitutional:  Well nourished. Alert and oriented, No acute distress. HEENT: [place] AT, moist  mucus membranes.  Trachea midline Cardiovascular: No clubbing, cyanosis, or edema. Respiratory: Normal respiratory effort, no increased work of breathing. Neurologic: Grossly intact, no focal deficits, moving all 4 extremities. Psychiatric: Normal mood and affect.    Laboratory Data:    Latest Ref Rng & Units 01/18/2022    9:10 AM 09/20/2021    9:15 AM 06/12/2021    9:02 AM  BMP  Glucose 70 - 99 mg/dL 100  87  92   BUN 6 - 23 mg/dL '13  23  22   '$ Creatinine 0.40 - 1.20 mg/dL 0.94  0.88  0.89   Sodium 135 - 145 mEq/L 140  140  141   Potassium 3.5 - 5.1 mEq/L 3.7  3.8  4.0   Chloride 96 - 112 mEq/L 106  105  107   CO2 19 - 32 mEq/L '29  30  28   '$ Calcium 8.4 - 10.5 mg/dL 8.9  8.7  8.8        Latest Ref Rng & Units 01/18/2022    9:10 AM 09/20/2021    9:15 AM 06/12/2021    9:02 AM  CBC  WBC 4.0 - 10.5 K/uL 3.7  3.7  3.5   Hemoglobin 12.0 - 15.0 g/dL 12.2  12.0  11.8   Hematocrit 36.0 - 46.0 % 36.7  36.4  35.7   Platelets 150.0 - 400.0 K/uL 150.0  140.0  124.0     Urinalysis See HPI and Epic I have reviewed the labs.  Pertinent Imaging:  Most Recent  Scan Result 0 04/10/22 09:51    KUB with stable 8 mm stone I have independently reviewed the films.  See HPI.      Assessment & Plan:    1. OAB -symptoms are mild  2. Right renal stone -stable -continue to monitor  3. High risk hematuria -work up in 2019 NED -no  reports of gross heme -UA negative for micro heme  Return in about 1 year (around 04/11/2023) for UA, KUB, OAB and PVR .    Zara Council, PA-C [facility] [address], [place],  [address] [phone]

## 2022-04-10 ENCOUNTER — Ambulatory Visit
Admission: RE | Admit: 2022-04-10 | Discharge: 2022-04-10 | Disposition: A | Discharge status: Home / Self Care | Payer: Medicare Other | Source: Ambulatory Visit | Attending: Urology | Admitting: Urology

## 2022-04-10 ENCOUNTER — Encounter: Payer: Self-pay | Admitting: Urology

## 2022-04-10 ENCOUNTER — Ambulatory Visit: Payer: Medicare Other | Admitting: Urology

## 2022-04-10 ENCOUNTER — Ambulatory Visit
Admission: RE | Admit: 2022-04-10 | Discharge: 2022-04-10 | Disposition: A | Discharge status: Home / Self Care | Payer: Medicare Other | Attending: Urology | Admitting: Urology

## 2022-04-10 ENCOUNTER — Other Ambulatory Visit: Payer: Self-pay | Admitting: Family Medicine

## 2022-04-10 VITALS — BP 123/69 | HR 66 | Ht 61.0 in | Wt 119.0 lb

## 2022-04-10 DIAGNOSIS — N200 Calculus of kidney: Secondary | ICD-10-CM

## 2022-04-10 DIAGNOSIS — N3281 Overactive bladder: Secondary | ICD-10-CM | POA: Diagnosis not present

## 2022-04-10 DIAGNOSIS — Z87442 Personal history of urinary calculi: Secondary | ICD-10-CM | POA: Diagnosis not present

## 2022-04-10 DIAGNOSIS — R319 Hematuria, unspecified: Secondary | ICD-10-CM

## 2022-04-10 DIAGNOSIS — N2889 Other specified disorders of kidney and ureter: Secondary | ICD-10-CM | POA: Diagnosis not present

## 2022-04-10 LAB — URINALYSIS, COMPLETE
Bilirubin, UA: NEGATIVE
Glucose, UA: NEGATIVE
Ketones, UA: NEGATIVE
Leukocytes,UA: NEGATIVE
Nitrite, UA: NEGATIVE
Protein,UA: NEGATIVE
RBC, UA: NEGATIVE
Specific Gravity, UA: 1.015 (ref 1.005–1.030)
Urobilinogen, Ur: 0.2 mg/dL (ref 0.2–1.0)
pH, UA: 7 (ref 5.0–7.5)

## 2022-04-10 LAB — BLADDER SCAN AMB NON-IMAGING: Scan Result: 0

## 2022-04-10 LAB — MICROSCOPIC EXAMINATION: Bacteria, UA: NONE SEEN

## 2022-04-10 NOTE — Addendum Note (Signed)
Addended by: Kyra Manges on: 04/10/2022 10:49 AM   Modules accepted: Orders

## 2022-04-11 DIAGNOSIS — M5459 Other low back pain: Secondary | ICD-10-CM | POA: Diagnosis not present

## 2022-04-11 DIAGNOSIS — M5416 Radiculopathy, lumbar region: Secondary | ICD-10-CM | POA: Diagnosis not present

## 2022-04-13 DIAGNOSIS — M5416 Radiculopathy, lumbar region: Secondary | ICD-10-CM | POA: Diagnosis not present

## 2022-04-13 DIAGNOSIS — M5459 Other low back pain: Secondary | ICD-10-CM | POA: Diagnosis not present

## 2022-04-16 DIAGNOSIS — M5416 Radiculopathy, lumbar region: Secondary | ICD-10-CM | POA: Diagnosis not present

## 2022-04-16 DIAGNOSIS — M5459 Other low back pain: Secondary | ICD-10-CM | POA: Diagnosis not present

## 2022-04-25 DIAGNOSIS — M5416 Radiculopathy, lumbar region: Secondary | ICD-10-CM | POA: Diagnosis not present

## 2022-04-25 DIAGNOSIS — M5459 Other low back pain: Secondary | ICD-10-CM | POA: Diagnosis not present

## 2022-05-09 DIAGNOSIS — M5459 Other low back pain: Secondary | ICD-10-CM | POA: Diagnosis not present

## 2022-05-09 DIAGNOSIS — M5416 Radiculopathy, lumbar region: Secondary | ICD-10-CM | POA: Diagnosis not present

## 2022-05-15 DIAGNOSIS — M5416 Radiculopathy, lumbar region: Secondary | ICD-10-CM | POA: Diagnosis not present

## 2022-05-15 DIAGNOSIS — M5459 Other low back pain: Secondary | ICD-10-CM | POA: Diagnosis not present

## 2022-05-18 DIAGNOSIS — G8310 Monoplegia of lower limb affecting unspecified side: Secondary | ICD-10-CM | POA: Diagnosis not present

## 2022-05-18 DIAGNOSIS — G959 Disease of spinal cord, unspecified: Secondary | ICD-10-CM | POA: Diagnosis not present

## 2022-05-22 ENCOUNTER — Encounter: Payer: Self-pay | Admitting: Internal Medicine

## 2022-05-22 DIAGNOSIS — M549 Dorsalgia, unspecified: Secondary | ICD-10-CM | POA: Insufficient documentation

## 2022-05-24 DIAGNOSIS — K219 Gastro-esophageal reflux disease without esophagitis: Secondary | ICD-10-CM | POA: Diagnosis not present

## 2022-05-24 DIAGNOSIS — R202 Paresthesia of skin: Secondary | ICD-10-CM | POA: Diagnosis not present

## 2022-05-24 DIAGNOSIS — K5909 Other constipation: Secondary | ICD-10-CM | POA: Diagnosis not present

## 2022-05-31 DIAGNOSIS — F3341 Major depressive disorder, recurrent, in partial remission: Secondary | ICD-10-CM | POA: Diagnosis not present

## 2022-05-31 DIAGNOSIS — F410 Panic disorder [episodic paroxysmal anxiety] without agoraphobia: Secondary | ICD-10-CM | POA: Diagnosis not present

## 2022-05-31 DIAGNOSIS — F411 Generalized anxiety disorder: Secondary | ICD-10-CM | POA: Diagnosis not present

## 2022-06-07 ENCOUNTER — Other Ambulatory Visit: Payer: Self-pay | Admitting: Internal Medicine

## 2022-06-08 ENCOUNTER — Telehealth: Payer: Self-pay | Admitting: Internal Medicine

## 2022-06-13 DIAGNOSIS — G959 Disease of spinal cord, unspecified: Secondary | ICD-10-CM | POA: Diagnosis not present

## 2022-06-14 ENCOUNTER — Other Ambulatory Visit: Payer: Self-pay | Admitting: Internal Medicine

## 2022-06-15 ENCOUNTER — Other Ambulatory Visit: Payer: Self-pay | Admitting: Internal Medicine

## 2022-06-15 DIAGNOSIS — Z1231 Encounter for screening mammogram for malignant neoplasm of breast: Secondary | ICD-10-CM

## 2022-06-22 DIAGNOSIS — M25512 Pain in left shoulder: Secondary | ICD-10-CM | POA: Diagnosis not present

## 2022-06-22 DIAGNOSIS — G959 Disease of spinal cord, unspecified: Secondary | ICD-10-CM | POA: Diagnosis not present

## 2022-06-27 ENCOUNTER — Ambulatory Visit (INDEPENDENT_AMBULATORY_CARE_PROVIDER_SITE_OTHER): Payer: Medicare Other | Admitting: Internal Medicine

## 2022-06-27 ENCOUNTER — Encounter: Payer: Self-pay | Admitting: Internal Medicine

## 2022-06-27 VITALS — BP 116/70 | HR 70 | Temp 98.0°F | Ht 61.0 in | Wt 121.8 lb

## 2022-06-27 DIAGNOSIS — G43709 Chronic migraine without aura, not intractable, without status migrainosus: Secondary | ICD-10-CM | POA: Diagnosis not present

## 2022-06-27 DIAGNOSIS — K5900 Constipation, unspecified: Secondary | ICD-10-CM

## 2022-06-27 DIAGNOSIS — F419 Anxiety disorder, unspecified: Secondary | ICD-10-CM

## 2022-06-27 DIAGNOSIS — D61818 Other pancytopenia: Secondary | ICD-10-CM | POA: Diagnosis not present

## 2022-06-27 DIAGNOSIS — D696 Thrombocytopenia, unspecified: Secondary | ICD-10-CM

## 2022-06-27 DIAGNOSIS — Z0000 Encounter for general adult medical examination without abnormal findings: Secondary | ICD-10-CM | POA: Diagnosis not present

## 2022-06-27 DIAGNOSIS — R319 Hematuria, unspecified: Secondary | ICD-10-CM

## 2022-06-27 DIAGNOSIS — F320 Major depressive disorder, single episode, mild: Secondary | ICD-10-CM

## 2022-06-27 DIAGNOSIS — I700 Atherosclerosis of aorta: Secondary | ICD-10-CM

## 2022-06-27 DIAGNOSIS — E7800 Pure hypercholesterolemia, unspecified: Secondary | ICD-10-CM

## 2022-06-27 DIAGNOSIS — M25519 Pain in unspecified shoulder: Secondary | ICD-10-CM

## 2022-06-27 DIAGNOSIS — K219 Gastro-esophageal reflux disease without esophagitis: Secondary | ICD-10-CM

## 2022-06-27 DIAGNOSIS — M5442 Lumbago with sciatica, left side: Secondary | ICD-10-CM

## 2022-06-27 LAB — CBC WITH DIFFERENTIAL/PLATELET
Basophils Absolute: 0 10*3/uL (ref 0.0–0.1)
Basophils Relative: 0.6 % (ref 0.0–3.0)
Eosinophils Absolute: 0.1 10*3/uL (ref 0.0–0.7)
Eosinophils Relative: 2.5 % (ref 0.0–5.0)
HCT: 38.5 % (ref 36.0–46.0)
Hemoglobin: 12.7 g/dL (ref 12.0–15.0)
Lymphocytes Relative: 31.4 % (ref 12.0–46.0)
Lymphs Abs: 1.4 10*3/uL (ref 0.7–4.0)
MCHC: 33 g/dL (ref 30.0–36.0)
MCV: 90.4 fl (ref 78.0–100.0)
Monocytes Absolute: 0.4 10*3/uL (ref 0.1–1.0)
Monocytes Relative: 9.7 % (ref 3.0–12.0)
Neutro Abs: 2.4 10*3/uL (ref 1.4–7.7)
Neutrophils Relative %: 55.8 % (ref 43.0–77.0)
Platelets: 164 10*3/uL (ref 150.0–400.0)
RBC: 4.27 Mil/uL (ref 3.87–5.11)
RDW: 13.7 % (ref 11.5–15.5)
WBC: 4.3 10*3/uL (ref 4.0–10.5)

## 2022-06-27 LAB — HEPATIC FUNCTION PANEL
ALT: 10 U/L (ref 0–35)
AST: 17 U/L (ref 0–37)
Albumin: 4.1 g/dL (ref 3.5–5.2)
Alkaline Phosphatase: 49 U/L (ref 39–117)
Bilirubin, Direct: 0.1 mg/dL (ref 0.0–0.3)
Total Bilirubin: 0.5 mg/dL (ref 0.2–1.2)
Total Protein: 6.4 g/dL (ref 6.0–8.3)

## 2022-06-27 LAB — FERRITIN: Ferritin: 38.3 ng/mL (ref 10.0–291.0)

## 2022-06-27 LAB — BASIC METABOLIC PANEL
BUN: 17 mg/dL (ref 6–23)
CO2: 31 mEq/L (ref 19–32)
Calcium: 9.4 mg/dL (ref 8.4–10.5)
Chloride: 108 mEq/L (ref 96–112)
Creatinine, Ser: 0.94 mg/dL (ref 0.40–1.20)
GFR: 60 mL/min — ABNORMAL LOW (ref >60.00)
Glucose, Bld: 101 mg/dL — ABNORMAL HIGH (ref 70–99)
Potassium: 4.3 mEq/L (ref 3.5–5.1)
Sodium: 143 mEq/L (ref 135–145)

## 2022-06-27 LAB — LIPID PANEL
Cholesterol: 164 mg/dL (ref 0–200)
HDL: 62.6 mg/dL (ref >39.00)
LDL Cholesterol: 88 mg/dL (ref 0–99)
NonHDL: 101.87
Total CHOL/HDL Ratio: 3
Triglycerides: 68 mg/dL (ref 0.0–149.0)
VLDL: 13.6 mg/dL (ref 0.0–40.0)

## 2022-06-27 LAB — TSH: TSH: 2.4 u[IU]/mL (ref 0.35–5.50)

## 2022-06-27 NOTE — Progress Notes (Signed)
Patient ID: Brandi Ramos, female   DOB: 24-Feb-1946, 76 y.o.   MRN: [id number]   Subjective:    Patient ID: Brandi Ramos, female    DOB: 12-09-1945, 76 y.o.   MRN: [id number]   Patient here for  Chief Complaint  Patient presents with   Annual Exam   .   HPI Here for physical exam.  Recently evaluated EMERGE - cervical myelopathy.  S/p injection - right posterior shoulder - helped. Also having numbness/tingling - left leg.  Stand for a long time - aggravates.  Continues to follow up with ortho.  Sees GI - f/u GERD and constipation.  Last seen 05/24/22.  Recommended increase daily water intake and trial of colace.  Also recommended taking daily psyllium.  Saw urology 04/10/22 (f/u) - continue to monitor right renal stone.  U/A negative for micro heme.  Recommended f/u in one year - KUB. Overall feels things are stable.  No chest pain.  Breathing stable.  No cough or congestion.     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    Past Surgical History:  Procedure Laterality Date   ABDOMINAL HYSTERECTOMY  1984   COLONOSCOPY     COLONOSCOPY WITH PROPOFOL N/A 09/26/2018   Procedure: COLONOSCOPY WITH PROPOFOL;  Surgeon: Lollie Sails, MD;  Location: [facility] ENDOSCOPY;  Service: Endoscopy;  Laterality: N/A;   ESOPHAGOGASTRODUODENOSCOPY (EGD) WITH PROPOFOL N/A 10/31/2015   Procedure: ESOPHAGOGASTRODUODENOSCOPY (EGD) WITH PROPOFOL;  Surgeon: Lollie Sails, MD;  Location: [facility] ENDOSCOPY;  Service:  Endoscopy;  Laterality: N/A;   ESOPHAGOGASTRODUODENOSCOPY (EGD) WITH PROPOFOL N/A 09/26/2018   Procedure: ESOPHAGOGASTRODUODENOSCOPY (EGD) WITH PROPOFOL;  Surgeon: Lollie Sails, MD;  Location: [facility] ENDOSCOPY;  Service: Endoscopy;  Laterality: N/A;   LAPAROSCOPIC APPENDECTOMY N/A 10/29/2018   Procedure: APPENDECTOMY LAPAROSCOPIC;  Surgeon: Herbert Pun, MD;  Location: [facility] ORS;  Service: General;  Laterality: N/A;   LAPAROSCOPIC BILATERAL SALPINGO OOPHERECTOMY Bilateral 10/29/2018   Procedure: LAPAROSCOPIC BILATERAL SALPINGO OOPHORECTOMY;  Surgeon: Ward, Honor Loh, MD;  Location: [facility] ORS;  Service: Gynecology;  Laterality: Bilateral;   Family History  Problem Relation Age of Onset   CAD Mother        s/p CABG   Hypercholesterolemia Mother    Breast cancer Mother 73   Heart disease Mother    Lung cancer Mother    Hypertension Father    Migraines Father    Hypercholesterolemia Father    Lung cancer Father    Migraines Sister    Arthritis Sister    Lymphoma Other        grandmother   Colon cancer Neg Hx    Social History   Socioeconomic History   Marital status: Widowed    Spouse name: Not on file   Number of children: 2   Years of education: Not on file  Highest education level: Not on file  Occupational History   Not on file  Tobacco Use   Smoking status: Never   Smokeless tobacco: Never  Vaping Use   Vaping Use: Never used  Substance and Sexual Activity   Alcohol use: Yes    Alcohol/week: 0.0 standard drinks of alcohol    Comment: wine occassionally   Drug use: No   Sexual activity: Not Currently  Other Topics Concern   Not on file  Social History Narrative   Not on file   Social Determinants of Health   Financial Resource Strain: Low Risk  (12/06/2021)   Overall Financial Resource Strain (CARDIA)    Difficulty of Paying Living Expenses: Not hard at all  Food Insecurity: No Food Insecurity (12/06/2021)   Hunger Vital Sign    Worried About Running  Out of Food in the Last Year: Never true    Ran Out of Food in the Last Year: Never true  Transportation Needs: No Transportation Needs (12/06/2021)   PRAPARE - Hydrologist (Medical): No    Lack of Transportation (Non-Medical): No  Physical Activity: Insufficiently Active (12/06/2021)   Exercise Vital Sign    Days of Exercise per Week: 1 day    Minutes of Exercise per Session: 40 min  Stress: No Stress Concern Present (12/06/2021)   [place]    Feeling of Stress : Not at all  Social Connections: Moderately Integrated (12/06/2021)   Social Connection and Isolation Panel [NHANES]    Frequency of Communication with Friends and Family: More than three times a week    Frequency of Social Gatherings with Friends and Family: More than three times a week    Attends Religious Services: More than 4 times per year    Active Member of [employer] or Organizations: Yes    Attends Archivist Meetings: More than 4 times per year    Marital Status: Widowed     Review of Systems  Constitutional:  Negative for appetite change and unexpected weight change.  HENT:  Negative for congestion, sinus pressure and sore throat.   Eyes:  Negative for pain and visual disturbance.  Respiratory:  Negative for cough, chest tightness and shortness of breath.   Cardiovascular:  Negative for chest pain, palpitations and leg swelling.  Gastrointestinal:  Negative for abdominal pain, diarrhea, nausea and vomiting.  Genitourinary:  Negative for difficulty urinating and dysuria.  Musculoskeletal:  Negative for joint swelling and myalgias.  Skin:  Negative for color change and rash.  Neurological:  Negative for dizziness.       Headaches stable.   Hematological:  Negative for adenopathy. Does not bruise/bleed easily.  Psychiatric/Behavioral:  Negative for agitation and dysphoric mood.        Objective:     BP 116/70  (BP Location: Left Arm, Patient Position: Sitting, Cuff Size: Normal)   Pulse 70   Temp 98 F (36.7 C) (Oral)   Ht '5\' 1"'$  (1.549 m)   Wt 121 lb 12.8 oz (55.2 kg)   SpO2 99%   BMI 23.01 kg/m  Wt Readings from Last 3 Encounters:  06/27/22 121 lb 12.8 oz (55.2 kg)  04/10/22 119 lb (54 kg)  04/05/22 119 lb 12.8 oz (54.3 kg)    Physical Exam Vitals reviewed.  Constitutional:      General: She is not in acute distress.    Appearance: Normal appearance. She is well-developed.  HENT:  Head: Normocephalic and atraumatic.     Right Ear: External ear normal.     Left Ear: External ear normal.  Eyes:     General: No scleral icterus.       Right eye: No discharge.        Left eye: No discharge.     Conjunctiva/sclera: Conjunctivae normal.  Neck:     Thyroid: No thyromegaly.  Cardiovascular:     Rate and Rhythm: Normal rate and regular rhythm.  Pulmonary:     Effort: No tachypnea, accessory muscle usage or respiratory distress.     Breath sounds: Normal breath sounds. No decreased breath sounds or wheezing.  Chest:  Breasts:    Right: No inverted nipple, mass, nipple discharge or tenderness (no axillary adenopathy).     Left: No inverted nipple, mass, nipple discharge or tenderness (no axilarry adenopathy).  Abdominal:     General: Bowel sounds are normal.     Palpations: Abdomen is soft.     Tenderness: There is no abdominal tenderness.  Musculoskeletal:        General: No swelling or tenderness.     Cervical back: Neck supple.  Lymphadenopathy:     Cervical: No cervical adenopathy.  Skin:    Findings: No erythema or rash.  Neurological:     Mental Status: She is alert and oriented to person, place, and time.  Psychiatric:        Mood and Affect: Mood normal.        Behavior: Behavior normal.      Outpatient Encounter Medications as of 06/27/2022  Medication Sig   DULoxetine (CYMBALTA) 60 MG capsule Take 1 capsule (60 mg total) by mouth daily.   Fe Fum-FePoly-Vit  C-Vit B3 (INTEGRA) 62.5-62.5-40-3 MG CAPS TAKE 1 CAPSULE BY MOUTH DAILY   fluticasone (FLONASE) 50 MCG/ACT nasal spray Place 2 sprays into both nostrils daily.   Magnesium Oxide, Elemental, 400 MG TABS Take one tablet per day   ondansetron (ZOFRAN ODT) 4 MG disintegrating tablet Take 1 tablet (4 mg total) by mouth 2 (two) times daily as needed for nausea or vomiting.   propranolol ER (INDERAL LA) 60 MG 24 hr capsule TK 1 C PO D   RABEprazole (ACIPHEX) 20 MG tablet Take 1 tablet (20 mg total) by mouth 2 (two) times daily before a meal.   rizatriptan (MAXALT) 10 MG tablet Take 10 mg by mouth as needed. May repeat in 2 hours if needed   rosuvastatin (CRESTOR) 5 MG tablet TAKE 1 TABLET BY MOUTH ON MONDAY, WEDNESDAY AND FRIDAY.   sucralfate (CARAFATE) 1 g tablet Take 1 tablet (1 g total) by mouth 2 (two) times daily as needed.   SUMAtriptan (IMITREX) 100 MG tablet    topiramate (TOPAMAX) 50 MG tablet TAKE 1 AND 1/2 TABLET BY MOUTH IN MORNING AND 1 TABLET BY MOUTH IN EVENING   [DISCONTINUED] loratadine (CLARITIN) 10 MG tablet Take 10 mg by mouth daily. (Patient not taking: Reported on 06/27/2022)   No facility-administered encounter medications on file as of 06/27/2022.     Lab Results  Component Value Date   WBC 4.3 06/27/2022   HGB 12.7 06/27/2022   HCT 38.5 06/27/2022   PLT 164.0 06/27/2022   GLUCOSE 101 (H) 06/27/2022   CHOL 164 06/27/2022   TRIG 68.0 06/27/2022   HDL 62.60 06/27/2022   LDLCALC 88 06/27/2022   ALT 10 06/27/2022   AST 17 06/27/2022   NA 143 06/27/2022   K 4.3 06/27/2022  CL 108 06/27/2022   CREATININE 0.94 06/27/2022   BUN 17 06/27/2022   CO2 31 06/27/2022   TSH 2.40 06/27/2022   HGBA1C 5.5 01/15/2018    Abdomen 1 view (KUB)  Result Date: 04/10/2022 CLINICAL DATA:  Renal stone EXAM: ABDOMEN - 1 VIEW COMPARISON:  03/02/2021 FINDINGS: Bowel gas pattern is nonspecific. Moderate amount of stool is seen in colon without signs of fecal impaction in rectum. There is 3  mm calcific density in the upper portion of right kidney. There is a 5 mm calcific density in the lower pole of right kidney. Kidneys are partly obscured by bowel contents. Visualized lower lung fields are clear. IMPRESSION: There are 2 small calcific densities overlying the right kidney suggesting right renal stones. Nonspecific bowel gas pattern. Electronically Signed   By: Elmer Picker M.D.   On: 04/10/2022 15:07       Assessment & Plan:   Problem List Items Addressed This Visit     Anxiety    Continue cymbalta and buspar.  Follow.       Aortic atherosclerosis (HCC)    Continue crestor.       Back pain    Emerge Lamount Cranker MD) - 05/21/22 - plan for cervical and thoracic MRI and EMG (normal EMG).  L4-5 ESI (06/26/22).  Discussed L4-L5 decompression surgery.       Constipation    Last seen 05/24/22.  Recommended increase daily water intake and trial of colace.  Also recommended taking daily psyllium.       GERD (gastroesophageal reflux disease)    On Aciphex. Stable. Follow.       Health care maintenance    Physical today 06/26/22.  Mammogram 06/13/21 - Birads I.  Scheduled for f/u mammogram 07/10/22.  Colonoscopy 09/26/18 - recommended f/u colonoscopy in 5 years.        Hematuria    Being followed by urology as outlined.  Being followed for kidney stone.  They recommended follow-up in 1 year with KUB and urinalysis.      Hypercholesterolemia    Continue Crestor.  Low-cholesterol diet and exercise.  Follow lipid panel liver function test.      Major depressive disorder, single episode, mild (HCC)    Continue trazodone, cymbalta and buspar.  Stable.  Follow.       Migraine headache    Followed by neurology.  On topamax and propranolol.  Has aimovig.  Stable.  Follow.       Pancytopenia ([place])    Have discussed with hematology.  Follow cbc.       Shoulder pain    S/p right shoulder injection 06/26/22 - [facility]      Thrombocytopenia ([place])    Have discussed  with hematology.  Follow cbc.       Other Visit Diagnoses     Routine general medical examination at a health care facility    -  Primary        Einar Pheasant, MD

## 2022-06-29 ENCOUNTER — Encounter: Payer: Self-pay | Admitting: Internal Medicine

## 2022-06-29 DIAGNOSIS — M25519 Pain in unspecified shoulder: Secondary | ICD-10-CM | POA: Insufficient documentation

## 2022-07-01 ENCOUNTER — Encounter: Payer: Self-pay | Admitting: Internal Medicine

## 2022-07-01 NOTE — Assessment & Plan Note (Signed)
Followed by neurology.  On topamax and propranolol.  Has aimovig.  Stable.  Follow.  

## 2022-07-01 NOTE — Assessment & Plan Note (Signed)
Last seen 05/24/22.  Recommended increase daily water intake and trial of colace.  Also recommended taking daily psyllium.

## 2022-07-01 NOTE — Assessment & Plan Note (Signed)
Continue trazodone, cymbalta and buspar.  Stable.  Follow.  

## 2022-07-01 NOTE — Assessment & Plan Note (Signed)
Have discussed with hematology.  Follow cbc.  

## 2022-07-01 NOTE — Assessment & Plan Note (Signed)
Physical today 06/26/22.  Mammogram 06/13/21 - Birads I.  Scheduled for f/u mammogram 07/10/22.  Colonoscopy 09/26/18 - recommended f/u colonoscopy in 5 years.

## 2022-07-01 NOTE — Assessment & Plan Note (Signed)
Being followed by urology as outlined.  Being followed for kidney stone.  They recommended follow-up in 1 year with KUB and urinalysis. 

## 2022-07-01 NOTE — Assessment & Plan Note (Signed)
S/p right shoulder injection 06/26/22 - [facility]

## 2022-07-01 NOTE — Assessment & Plan Note (Signed)
Continue crestor 

## 2022-07-01 NOTE — Assessment & Plan Note (Signed)
Continue cymbalta and buspar.  Follow.  

## 2022-07-01 NOTE — Assessment & Plan Note (Signed)
Emerge Lamount Cranker MD) - 05/21/22 - plan for cervical and thoracic MRI and EMG (normal EMG).  L4-5 ESI (06/26/22).  Discussed L4-L5 decompression surgery.

## 2022-07-01 NOTE — Assessment & Plan Note (Signed)
Continue Crestor.  Low-cholesterol diet and exercise.  Follow lipid panel liver function test. 

## 2022-07-01 NOTE — Assessment & Plan Note (Signed)
On Aciphex. Stable. Follow.  

## 2022-07-10 ENCOUNTER — Ambulatory Visit
Admission: RE | Admit: 2022-07-10 | Discharge: 2022-07-10 | Disposition: A | Discharge status: Home / Self Care | Payer: Medicare Other | Source: Ambulatory Visit | Attending: Internal Medicine | Admitting: Internal Medicine

## 2022-07-10 DIAGNOSIS — Z1231 Encounter for screening mammogram for malignant neoplasm of breast: Secondary | ICD-10-CM | POA: Insufficient documentation

## 2022-07-13 DIAGNOSIS — H40003 Preglaucoma, unspecified, bilateral: Secondary | ICD-10-CM | POA: Diagnosis not present

## 2022-07-25 DIAGNOSIS — M5416 Radiculopathy, lumbar region: Secondary | ICD-10-CM | POA: Diagnosis not present

## 2022-08-10 ENCOUNTER — Other Ambulatory Visit: Payer: Self-pay | Admitting: Internal Medicine

## 2022-08-11 DIAGNOSIS — H524 Presbyopia: Secondary | ICD-10-CM | POA: Diagnosis not present

## 2022-08-17 DIAGNOSIS — M5416 Radiculopathy, lumbar region: Secondary | ICD-10-CM | POA: Diagnosis not present

## 2022-08-27 DIAGNOSIS — F3341 Major depressive disorder, recurrent, in partial remission: Secondary | ICD-10-CM | POA: Diagnosis not present

## 2022-08-27 DIAGNOSIS — F411 Generalized anxiety disorder: Secondary | ICD-10-CM | POA: Diagnosis not present

## 2022-08-27 DIAGNOSIS — F410 Panic disorder [episodic paroxysmal anxiety] without agoraphobia: Secondary | ICD-10-CM | POA: Diagnosis not present

## 2022-08-29 ENCOUNTER — Telehealth: Payer: Self-pay | Admitting: Internal Medicine

## 2022-08-29 ENCOUNTER — Other Ambulatory Visit: Payer: Self-pay

## 2022-08-29 MED ORDER — MAGNESIUM OXIDE (ELEMENTAL) 400 MG PO TABS: ORAL_TABLET | ORAL | 3 refills | Status: DC

## 2022-08-29 NOTE — Telephone Encounter (Signed)
Prescription Request  08/29/2022  Is this a "Controlled Substance" medicine? No  LOV: 06/27/2022  What is the name of the medication or equipment? Magnesium Oxide, Elemental, 400 MG TABS   Have you contacted your pharmacy to request a refill? Yes   Which pharmacy would you like this sent to?  [facility] #[id number] [address], [place] AT [facility] OF [address] & [address] [address] Phone: [phone] Fax: [phone]     Patient notified that their request is being sent to the clinical staff for review and that they should receive a response within 2 business days.   Please advise at [place]

## 2022-08-29 NOTE — Telephone Encounter (Signed)
sent 

## 2022-09-07 ENCOUNTER — Telehealth: Payer: Self-pay | Admitting: Internal Medicine

## 2022-09-07 ENCOUNTER — Other Ambulatory Visit: Payer: Self-pay

## 2022-09-07 MED ORDER — DULOXETINE HCL 60 MG PO CPEP: 60.0000 mg | ORAL_CAPSULE | Freq: Every day | ORAL | 1 refills | Status: DC

## 2022-09-07 MED ORDER — ROSUVASTATIN CALCIUM 5 MG PO TABS: ORAL_TABLET | ORAL | 1 refills | Status: DC

## 2022-09-07 NOTE — Telephone Encounter (Signed)
Prescription Request  09/07/2022  Is this a "Controlled Substance" medicine? No  LOV: 06/27/2022  What is the name of the medication or equipment? DULoxetine (CYMBALTA) 60 MG capsule,  rosuvastatin (CRESTOR) 5 MG tablet  Have you contacted your pharmacy to request a refill? Yes   Which pharmacy would you like this sent to?  [facility] #[id number] [address] Heal, [place] AT [facility] OF [address] & [address] [place] [address] Phone: [phone] Fax: [phone]     Patient notified that their request is being sent to the clinical staff for review and that they should receive a response within 2 business days.   Please advise at Mobile [phone] (mobile)

## 2022-09-07 NOTE — Telephone Encounter (Signed)
sent 

## 2022-10-10 DIAGNOSIS — G4701 Insomnia due to medical condition: Secondary | ICD-10-CM | POA: Diagnosis not present

## 2022-10-10 DIAGNOSIS — J3089 Other allergic rhinitis: Secondary | ICD-10-CM | POA: Diagnosis not present

## 2022-10-10 DIAGNOSIS — G43719 Chronic migraine without aura, intractable, without status migrainosus: Secondary | ICD-10-CM | POA: Diagnosis not present

## 2022-10-10 DIAGNOSIS — G4723 Circadian rhythm sleep disorder, irregular sleep wake type: Secondary | ICD-10-CM | POA: Diagnosis not present

## 2022-10-19 ENCOUNTER — Ambulatory Visit (INDEPENDENT_AMBULATORY_CARE_PROVIDER_SITE_OTHER): Payer: Medicare Other | Admitting: Nurse Practitioner

## 2022-10-19 ENCOUNTER — Encounter: Payer: Self-pay | Admitting: Nurse Practitioner

## 2022-10-19 VITALS — BP 122/74 | HR 78 | Temp 98.2°F | Ht 61.0 in | Wt 119.8 lb

## 2022-10-19 DIAGNOSIS — J329 Chronic sinusitis, unspecified: Secondary | ICD-10-CM

## 2022-10-19 MED ORDER — AMOXICILLIN 500 MG PO TABS: 500.0000 mg | ORAL_TABLET | Freq: Two times a day (BID) | ORAL | 0 refills | Status: DC

## 2022-10-19 MED ORDER — FLUTICASONE PROPIONATE 50 MCG/ACT NA SUSP: 2.0000 | Freq: Every day | NASAL | 3 refills | Status: AC

## 2022-10-19 NOTE — Assessment & Plan Note (Signed)
Started on amoxicillin 500 mg twice a day for 7 days and Flonase nasal spray. Advised her to increase hydration and use humidifier.

## 2022-10-19 NOTE — Progress Notes (Signed)
Established Patient Office Visit  Subjective:  Patient ID: Brandi Ramos, female    DOB: Aug 25, 1946  Age: 77 y.o. MRN: [id number]  CC:  Chief Complaint  Patient presents with   Acute Visit    Sinus pressure under eyes Sinus Headache Really congested since Tuesday     HPI  Brandi Ramos presents for sinus pressure and headache.    Sinus Problem This is a new problem. The current episode started in the past 7 days. The problem has been gradually improving since onset. There has been no fever. The pain is mild. Associated symptoms include congestion, coughing, headaches, sinus pressure and sneezing. Pertinent negatives include no chills, ear pain, neck pain, shortness of breath or sore throat. Past treatments include oral decongestants. The treatment provided mild relief.  She has h/o migraine is taking her migraine medication that helps with the headache but have  more congestion on then left side compared to right. She run off the Flonase nasal spray.  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     Past Surgical History:  Procedure Laterality Date   ABDOMINAL HYSTERECTOMY  1984   COLONOSCOPY     COLONOSCOPY WITH PROPOFOL N/A 09/26/2018   Procedure: COLONOSCOPY WITH PROPOFOL;  Surgeon: Lollie Sails, MD;  Location: [facility] ENDOSCOPY;  Service: Endoscopy;  Laterality: N/A;   ESOPHAGOGASTRODUODENOSCOPY (EGD) WITH PROPOFOL N/A 10/31/2015   Procedure: ESOPHAGOGASTRODUODENOSCOPY (EGD)  WITH PROPOFOL;  Surgeon: Lollie Sails, MD;  Location: [facility] ENDOSCOPY;  Service: Endoscopy;  Laterality: N/A;   ESOPHAGOGASTRODUODENOSCOPY (EGD) WITH PROPOFOL N/A 09/26/2018   Procedure: ESOPHAGOGASTRODUODENOSCOPY (EGD) WITH PROPOFOL;  Surgeon: Lollie Sails, MD;  Location: [facility] ENDOSCOPY;  Service: Endoscopy;  Laterality: N/A;   LAPAROSCOPIC APPENDECTOMY N/A 10/29/2018   Procedure: APPENDECTOMY LAPAROSCOPIC;  Surgeon: Herbert Pun, MD;  Location: [facility] ORS;  Service: General;  Laterality: N/A;   LAPAROSCOPIC BILATERAL SALPINGO OOPHERECTOMY Bilateral 10/29/2018   Procedure: LAPAROSCOPIC BILATERAL SALPINGO OOPHORECTOMY;  Surgeon: Ward, Honor Loh, MD;  Location: [facility] ORS;  Service: Gynecology;  Laterality: Bilateral;    Family History  Problem Relation Age of Onset   CAD Mother        s/p CABG   Hypercholesterolemia Mother    Breast cancer Mother 43   Heart disease Mother    Lung cancer Mother    Hypertension Father    Migraines Father    Hypercholesterolemia Father    Lung cancer Father    Migraines Sister    Arthritis Sister    Lymphoma Other        grandmother   Colon cancer Neg Hx     Social History   Socioeconomic History   Marital status: Widowed    Spouse name: Not on file   Number of children: 2   Years of education: Not on file   Highest education  level: Not on file  Occupational History   Not on file  Tobacco Use   Smoking status: Never   Smokeless tobacco: Never  Vaping Use   Vaping Use: Never used  Substance and Sexual Activity   Alcohol use: Yes    Alcohol/week: 0.0 standard drinks of alcohol    Comment: wine occassionally   Drug use: No   Sexual activity: Not Currently  Other Topics Concern   Not on file  Social History Narrative   Not on file   Social Determinants of Health   Financial Resource Strain: Low Risk  (12/06/2021)   Overall Financial Resource Strain (CARDIA)    Difficulty of Paying Living Expenses: Not hard at all  Food  Insecurity: No Food Insecurity (12/06/2021)   Hunger Vital Sign    Worried About Running Out of Food in the Last Year: Never true    Ran Out of Food in the Last Year: Never true  Transportation Needs: No Transportation Needs (12/06/2021)   PRAPARE - Hydrologist (Medical): No    Lack of Transportation (Non-Medical): No  Physical Activity: Insufficiently Active (12/06/2021)   Exercise Vital Sign    Days of Exercise per Week: 1 day    Minutes of Exercise per Session: 40 min  Stress: No Stress Concern Present (12/06/2021)   [place]    Feeling of Stress : Not at all  Social Connections: Moderately Integrated (12/06/2021)   Social Connection and Isolation Panel [NHANES]    Frequency of Communication with Friends and Family: More than three times a week    Frequency of Social Gatherings with Friends and Family: More than three times a week    Attends Religious Services: More than 4 times per year    Active Member of [employer] or Organizations: Yes    Attends Archivist Meetings: More than 4 times per year    Marital Status: Widowed  Intimate Partner Violence: Not At Risk (12/06/2021)   Humiliation, Afraid, Rape, and Kick questionnaire    Fear of Current or Ex-Partner: No    Emotionally Abused: No    Physically Abused: No    Sexually Abused: No     Outpatient Medications Prior to Visit  Medication Sig Dispense Refill   DULoxetine (CYMBALTA) 60 MG capsule Take 1 capsule (60 mg total) by mouth daily. 90 capsule 1   Fe Fum-FePoly-Vit C-Vit B3 (INTEGRA) 62.5-62.5-40-3 MG CAPS TAKE 1 CAPSULE BY MOUTH DAILY 30 capsule 1   Magnesium Oxide, Elemental, 400 MG TABS Take one tablet per day 30 tablet 3   ondansetron (ZOFRAN ODT) 4 MG disintegrating tablet Take 1 tablet (4 mg total) by mouth 2 (two) times daily as needed for nausea or vomiting. 14 tablet 0   propranolol ER (INDERAL LA) 60 MG 24 hr  capsule TK 1 C PO D     RABEprazole (ACIPHEX) 20 MG tablet Take 1 tablet (20 mg total) by mouth 2 (two) times daily before a meal. 120 tablet 1   rizatriptan (MAXALT) 10 MG tablet Take 10 mg by mouth as needed. May repeat in 2 hours if needed     rosuvastatin (CRESTOR) 5 MG tablet TAKE 1 TABLET BY MOUTH ON MONDAY, WEDNESDAY AND FRIDAY. 36 tablet 1   sucralfate (CARAFATE) 1 g tablet Take 1 tablet (1 g total) by mouth 2 (two) times daily as needed. 60 tablet 0   SUMAtriptan (IMITREX) 100 MG tablet  topiramate (TOPAMAX) 50 MG tablet TAKE 1 AND 1/2 TABLET BY MOUTH IN MORNING AND 1 TABLET BY MOUTH IN EVENING     fluticasone (FLONASE) 50 MCG/ACT nasal spray Place 2 sprays into both nostrils daily. 16 g 3   No facility-administered medications prior to visit.    Allergies  Allergen Reactions   Amitiza [Lubiprostone] Other (See Comments)   Sulfa Antibiotics     ROS Review of Systems  Constitutional:  Negative for chills.  HENT:  Positive for congestion, sinus pressure and sneezing. Negative for ear pain and sore throat.   Respiratory:  Positive for cough. Negative for shortness of breath.   Musculoskeletal:  Negative for neck pain.  Neurological:  Positive for headaches.  Psychiatric/Behavioral: Negative.        Objective:    Physical Exam Constitutional:      Appearance: Normal appearance.  HENT:     Right Ear: Tympanic membrane normal.     Left Ear: Tympanic membrane normal.     Nose:     Left Turbinates: Swollen.     Right Sinus: Frontal sinus tenderness present.     Left Sinus: Maxillary sinus tenderness and frontal sinus tenderness present.  Neurological:     Mental Status: She is alert.     BP 122/74   Pulse 78   Temp 98.2 F (36.8 C)   Ht 5' 1"$  (1.549 m)   Wt 119 lb 12.8 oz (54.3 kg)   SpO2 99%   BMI 22.64 kg/m  Wt Readings from Last 3 Encounters:  10/19/22 119 lb 12.8 oz (54.3 kg)  06/27/22 121 lb 12.8 oz (55.2 kg)  04/10/22 119 lb (54 kg)     Health  Maintenance  Topic Date Due   DTaP/Tdap/Td (1 - Tdap) Never done   DEXA SCAN  Never done   Medicare Annual Wellness (AWV)  12/07/2022   COVID-19 Vaccine (4 - 2023-24 season) 11/03/2022 (Originally 05/11/2022)   INFLUENZA VACCINE  12/09/2022 (Originally 04/10/2022)   MAMMOGRAM  07/11/2023   COLONOSCOPY (Pts 45-70yr Insurance coverage will need to be confirmed)  09/26/2028   Pneumonia Vaccine 75 Years old  Completed   Hepatitis C Screening  Completed   Zoster Vaccines- Shingrix  Completed   HPV VACCINES  Aged Out    There are no preventive care reminders to display for this patient.  Lab Results  Component Value Date   TSH 2.40 06/27/2022   Lab Results  Component Value Date   WBC 4.3 06/27/2022   HGB 12.7 06/27/2022   HCT 38.5 06/27/2022   MCV 90.4 06/27/2022   PLT 164.0 06/27/2022   Lab Results  Component Value Date   NA 143 06/27/2022   K 4.3 06/27/2022   CO2 31 06/27/2022   GLUCOSE 101 (H) 06/27/2022   BUN 17 06/27/2022   CREATININE 0.94 06/27/2022   BILITOT 0.5 06/27/2022   ALKPHOS 49 06/27/2022   AST 17 06/27/2022   ALT 10 06/27/2022   PROT 6.4 06/27/2022   ALBUMIN 4.1 06/27/2022   CALCIUM 9.4 06/27/2022   GFR 60.00 (L) 06/27/2022   Lab Results  Component Value Date   CHOL 164 06/27/2022   Lab Results  Component Value Date   HDL 62.60 06/27/2022   Lab Results  Component Value Date   LDLCALC 88 06/27/2022   Lab Results  Component Value Date   TRIG 68.0 06/27/2022   Lab Results  Component Value Date   CHOLHDL 3 06/27/2022   Lab Results  Component Value  Date   HGBA1C 5.5 01/15/2018      Assessment & Plan:   Problem List Items Addressed This Visit       Respiratory   Sinusitis - Primary    Started on amoxicillin 500 mg twice a day for 7 days and Flonase nasal spray. Advised her to increase hydration and use humidifier.       Relevant Medications   fluticasone (FLONASE) 50 MCG/ACT nasal spray   amoxicillin (AMOXIL) 500 MG tablet      Meds ordered this encounter  Medications   fluticasone (FLONASE) 50 MCG/ACT nasal spray    Sig: Place 2 sprays into both nostrils daily.    Dispense:  16 g    Refill:  3   amoxicillin (AMOXIL) 500 MG tablet    Sig: Take 1 tablet (500 mg total) by mouth 2 (two) times daily.    Dispense:  14 tablet    Refill:  0     Follow-up: Return if symptoms worsen or fail to improve.    Theresia Lo, NP

## 2022-10-19 NOTE — Patient Instructions (Addendum)
You have sinus infection. Prescription of amoxicillin and Flonase nasal spray has been sent to the pharmacy. Increase hydration, use steam and humidifier. If symptoms do not improve please call the office back for follow-up.

## 2022-10-22 ENCOUNTER — Other Ambulatory Visit: Payer: Self-pay | Admitting: Internal Medicine

## 2022-10-31 ENCOUNTER — Ambulatory Visit (INDEPENDENT_AMBULATORY_CARE_PROVIDER_SITE_OTHER): Payer: Medicare Other | Admitting: Internal Medicine

## 2022-10-31 ENCOUNTER — Other Ambulatory Visit: Payer: Self-pay | Admitting: Internal Medicine

## 2022-10-31 ENCOUNTER — Encounter: Payer: Self-pay | Admitting: Internal Medicine

## 2022-10-31 VITALS — BP 112/70 | HR 73 | Temp 98.0°F | Resp 16 | Ht 64.0 in | Wt 119.0 lb

## 2022-10-31 DIAGNOSIS — K5900 Constipation, unspecified: Secondary | ICD-10-CM

## 2022-10-31 DIAGNOSIS — F419 Anxiety disorder, unspecified: Secondary | ICD-10-CM | POA: Diagnosis not present

## 2022-10-31 DIAGNOSIS — D72819 Decreased white blood cell count, unspecified: Secondary | ICD-10-CM

## 2022-10-31 DIAGNOSIS — G43709 Chronic migraine without aura, not intractable, without status migrainosus: Secondary | ICD-10-CM

## 2022-10-31 DIAGNOSIS — Z9109 Other allergy status, other than to drugs and biological substances: Secondary | ICD-10-CM

## 2022-10-31 DIAGNOSIS — E2839 Other primary ovarian failure: Secondary | ICD-10-CM

## 2022-10-31 DIAGNOSIS — R319 Hematuria, unspecified: Secondary | ICD-10-CM

## 2022-10-31 DIAGNOSIS — F320 Major depressive disorder, single episode, mild: Secondary | ICD-10-CM

## 2022-10-31 DIAGNOSIS — D696 Thrombocytopenia, unspecified: Secondary | ICD-10-CM

## 2022-10-31 DIAGNOSIS — E7800 Pure hypercholesterolemia, unspecified: Secondary | ICD-10-CM

## 2022-10-31 DIAGNOSIS — D61818 Other pancytopenia: Secondary | ICD-10-CM

## 2022-10-31 DIAGNOSIS — K219 Gastro-esophageal reflux disease without esophagitis: Secondary | ICD-10-CM

## 2022-10-31 DIAGNOSIS — I700 Atherosclerosis of aorta: Secondary | ICD-10-CM

## 2022-10-31 DIAGNOSIS — R110 Nausea: Secondary | ICD-10-CM

## 2022-10-31 LAB — BASIC METABOLIC PANEL
BUN: 14 mg/dL (ref 6–23)
CO2: 29 mEq/L (ref 19–32)
Calcium: 9.2 mg/dL (ref 8.4–10.5)
Chloride: 107 mEq/L (ref 96–112)
Creatinine, Ser: 0.94 mg/dL (ref 0.40–1.20)
GFR: 59.86 mL/min — ABNORMAL LOW (ref >60.00)
Glucose, Bld: 99 mg/dL (ref 70–99)
Potassium: 3.9 mEq/L (ref 3.5–5.1)
Sodium: 142 mEq/L (ref 135–145)

## 2022-10-31 LAB — CBC WITH DIFFERENTIAL/PLATELET
Basophils Absolute: 0 10*3/uL (ref 0.0–0.1)
Basophils Relative: 0.6 % (ref 0.0–3.0)
Eosinophils Absolute: 0.1 10*3/uL (ref 0.0–0.7)
Eosinophils Relative: 3.8 % (ref 0.0–5.0)
HCT: 38.3 % (ref 36.0–46.0)
Hemoglobin: 12.9 g/dL (ref 12.0–15.0)
Lymphocytes Relative: 31.1 % (ref 12.0–46.0)
Lymphs Abs: 1.2 10*3/uL (ref 0.7–4.0)
MCHC: 33.6 g/dL (ref 30.0–36.0)
MCV: 90.8 fl (ref 78.0–100.0)
Monocytes Absolute: 0.4 10*3/uL (ref 0.1–1.0)
Monocytes Relative: 9.9 % (ref 3.0–12.0)
Neutro Abs: 2.1 10*3/uL (ref 1.4–7.7)
Neutrophils Relative %: 54.6 % (ref 43.0–77.0)
Platelets: 159 10*3/uL (ref 150.0–400.0)
RBC: 4.21 Mil/uL (ref 3.87–5.11)
RDW: 13.6 % (ref 11.5–15.5)
WBC: 3.8 10*3/uL — ABNORMAL LOW (ref 4.0–10.5)

## 2022-10-31 LAB — HEPATIC FUNCTION PANEL
ALT: 12 U/L (ref 0–35)
AST: 21 U/L (ref 0–37)
Albumin: 4 g/dL (ref 3.5–5.2)
Alkaline Phosphatase: 49 U/L (ref 39–117)
Bilirubin, Direct: 0.1 mg/dL (ref 0.0–0.3)
Total Bilirubin: 0.5 mg/dL (ref 0.2–1.2)
Total Protein: 6.1 g/dL (ref 6.0–8.3)

## 2022-10-31 LAB — LIPID PANEL
Cholesterol: 161 mg/dL (ref 0–200)
HDL: 61.2 mg/dL (ref >39.00)
LDL Cholesterol: 83 mg/dL (ref 0–99)
NonHDL: 99.33
Total CHOL/HDL Ratio: 3
Triglycerides: 80 mg/dL (ref 0.0–149.0)
VLDL: 16 mg/dL (ref 0.0–40.0)

## 2022-10-31 MED ORDER — INTEGRA 62.5-62.5-40-3 MG PO CAPS: 1.0000 | ORAL_CAPSULE | Freq: Every day | ORAL | 3 refills | Status: DC

## 2022-10-31 MED ORDER — ROSUVASTATIN CALCIUM 5 MG PO TABS: 5.0000 mg | ORAL_TABLET | ORAL | 3 refills | Status: DC

## 2022-10-31 MED ORDER — DULOXETINE HCL 60 MG PO CPEP: 60.0000 mg | ORAL_CAPSULE | Freq: Every day | ORAL | 1 refills | Status: DC

## 2022-10-31 MED ORDER — MAGNESIUM OXIDE (ELEMENTAL) 400 MG PO TABS: ORAL_TABLET | ORAL | 3 refills | Status: DC

## 2022-10-31 MED ORDER — SUCRALFATE 1 G PO TABS: 1.0000 g | ORAL_TABLET | Freq: Two times a day (BID) | ORAL | 0 refills | Status: DC | PRN

## 2022-10-31 NOTE — Progress Notes (Signed)
Subjective:    Patient ID: Brandi Ramos, female    DOB: [birth year], 77 y.o.   MRN: [id number]  Patient here for  Chief Complaint  Patient presents with   Medical Management of Chronic Issues    HPI Here to follow up regarding increased stress/anxiety.  On cymbalta and buspar.  Also f/u regarding hypercholesterolemia and constipation.  Sees GI - f/u GERD and constipation. Last seen 05/24/22. Recommended increase daily water intake and trial of colace. Also recommended taking daily psyllium. Saw urology 04/10/22 (f/u) - continue to monitor right renal stone. U/A negative for micro heme. Recommended f/u in one year - KUB.  Saw Dr Chancy Milroy (neurology) - f/u headaches.  Recommended topamax (split dose) and continue propranolol.  Maxalt prn.  Trazodone for sleep.  Also prescribed singulair to help with allergies.  Was seen 10/19/22 - work in - sinus infection. Treated with amoxicillin and flonase.  Doing better - no increased congestion currently.  Has noticed intermittent episodes - feels hot.  No fever.  No acute symptoms.  No chest pain or sob.  No increased heart rate or palpitations.  Intermittent nausea.  Trying to get bowels more regular.  Takes dulcolax qod.     Past Medical History:  Diagnosis Date   Anxiety    Arthritis    Chicken pox    Depression    Diverticulosis    Fibrocystic breast disease    Frozen shoulder    Fundic gland polyps of stomach, benign    GERD (gastroesophageal reflux disease)    History of diverticulitis of colon    Kidney stones    Migraine headache    Neutropenia ([place])    worked up - Dr Oliva Bustard, slightly elevated ANA   PUD (peptic ulcer disease)    Recurrent vaginitis    Seasonal allergies    Skin cancer, basal cell    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    Past Surgical History:  Procedure Laterality Date   ABDOMINAL HYSTERECTOMY  1984   COLONOSCOPY     COLONOSCOPY WITH PROPOFOL N/A 09/26/2018   Procedure: COLONOSCOPY WITH PROPOFOL;  Surgeon: Lollie Sails, MD;  Location: [facility] ENDOSCOPY;  Service: Endoscopy;  Laterality: N/A;   ESOPHAGOGASTRODUODENOSCOPY (EGD) WITH PROPOFOL N/A 10/31/2015   Procedure: ESOPHAGOGASTRODUODENOSCOPY (EGD) WITH PROPOFOL;  Surgeon: Lollie Sails, MD;  Location: [facility] ENDOSCOPY;  Service: Endoscopy;  Laterality: N/A;   ESOPHAGOGASTRODUODENOSCOPY (EGD) WITH PROPOFOL N/A 09/26/2018   Procedure: ESOPHAGOGASTRODUODENOSCOPY (EGD) WITH PROPOFOL;  Surgeon: Lollie Sails, MD;  Location: [facility] ENDOSCOPY;  Service: Endoscopy;  Laterality: N/A;   LAPAROSCOPIC APPENDECTOMY N/A 10/29/2018   Procedure: APPENDECTOMY LAPAROSCOPIC;  Surgeon: Herbert Pun, MD;  Location: [facility] ORS;  Service: General;  Laterality: N/A;   LAPAROSCOPIC BILATERAL SALPINGO OOPHERECTOMY Bilateral 10/29/2018   Procedure: LAPAROSCOPIC BILATERAL SALPINGO OOPHORECTOMY;  Surgeon: Ward, Honor Loh, MD;  Location: [facility] ORS;  Service: Gynecology;  Laterality: Bilateral;   Family History  Problem Relation Age of Onset   CAD Mother        s/p CABG   Hypercholesterolemia Mother    Breast cancer Mother 56   Heart disease Mother    Lung cancer Mother    Hypertension Father    Migraines Father    Hypercholesterolemia Father    Lung cancer Father    Migraines Sister    Arthritis Sister    Lymphoma Other        grandmother   Colon cancer Neg Hx    Social  History   Socioeconomic History   Marital status: Widowed    Spouse name: Not on file   Number of children: 2   Years of education: Not on file   Highest education level: Not on file  Occupational History   Not on file  Tobacco Use   Smoking status: Never   Smokeless tobacco: Never  Vaping Use   Vaping Use: Never used  Substance and Sexual Activity   Alcohol use: Yes    Alcohol/week: 0.0 standard drinks of alcohol    Comment: wine occassionally   Drug use: No   Sexual activity: Not Currently  Other Topics Concern   Not on file  Social History Narrative   Not on file   Social  Determinants of Health   Financial Resource Strain: Low Risk  (12/06/2021)   Overall Financial Resource Strain (CARDIA)    Difficulty of Paying Living Expenses: Not hard at all  Food Insecurity: No Food Insecurity (12/06/2021)   Hunger Vital Sign    Worried About Running Out of Food in the Last Year: Never true    Ran Out of Food in the Last Year: Never true  Transportation Needs: No Transportation Needs (12/06/2021)   PRAPARE - Hydrologist (Medical): No    Lack of Transportation (Non-Medical): No  Physical Activity: Insufficiently Active (12/06/2021)   Exercise Vital Sign    Days of Exercise per Week: 1 day    Minutes of Exercise per Session: 40 min  Stress: No Stress Concern Present (12/06/2021)   [place]    Feeling of Stress : Not at all  Social Connections: Moderately Integrated (12/06/2021)   Social Connection and Isolation Panel [NHANES]    Frequency of Communication with Friends and Family: More than three times a week    Frequency of Social Gatherings with Friends and Family: More than three times a week    Attends Religious Services: More than 4 times per year    Active Member of [employer] or Organizations: Yes    Attends Archivist Meetings: More than 4 times per year    Marital Status: Widowed     Review of Systems  Constitutional:  Negative for appetite change, fever and unexpected weight change.  HENT:  Negative for congestion and sinus pressure.   Respiratory:  Negative for cough, chest tightness and shortness of breath.   Cardiovascular:  Negative for chest pain and palpitations.  Gastrointestinal:  Positive for constipation and nausea. Negative for abdominal pain, diarrhea and vomiting.  Genitourinary:  Negative for difficulty urinating and dysuria.  Musculoskeletal:  Negative for joint swelling and myalgias.  Skin:  Negative for color change and rash.   Neurological:  Negative for dizziness and headaches.  Psychiatric/Behavioral:  Negative for agitation and dysphoric mood.        Objective:     BP 112/70   Pulse 73   Temp 98 F (36.7 C)   Resp 16   Ht '5\' 4"'$  (1.626 m)   Wt 119 lb (54 kg)   SpO2 98%   BMI 20.43 kg/m  Wt Readings from Last 3 Encounters:  10/31/22 119 lb (54 kg)  10/19/22 119 lb 12.8 oz (54.3 kg)  06/27/22 121 lb 12.8 oz (55.2 kg)    Physical Exam Vitals reviewed.  Constitutional:      General: She is not in acute distress.    Appearance: Normal appearance.  HENT:  Head: Normocephalic and atraumatic.     Right Ear: External ear normal.     Left Ear: External ear normal.  Eyes:     General: No scleral icterus.       Right eye: No discharge.        Left eye: No discharge.     Conjunctiva/sclera: Conjunctivae normal.  Neck:     Thyroid: No thyromegaly.  Cardiovascular:     Rate and Rhythm: Normal rate and regular rhythm.  Pulmonary:     Effort: No respiratory distress.     Breath sounds: Normal breath sounds. No wheezing.  Abdominal:     General: Bowel sounds are normal.     Palpations: Abdomen is soft.     Tenderness: There is no abdominal tenderness.  Musculoskeletal:        General: No swelling or tenderness.     Cervical back: Neck supple. No tenderness.  Lymphadenopathy:     Cervical: No cervical adenopathy.  Skin:    Findings: No erythema or rash.  Neurological:     Mental Status: She is alert.  Psychiatric:        Mood and Affect: Mood normal.        Behavior: Behavior normal.      Outpatient Encounter Medications as of 10/31/2022  Medication Sig   DULoxetine (CYMBALTA) 60 MG capsule Take 1 capsule (60 mg total) by mouth daily.   Fe Fum-FePoly-Vit C-Vit B3 (INTEGRA) 62.5-62.5-40-3 MG CAPS Take 1 capsule by mouth daily.   fluticasone (FLONASE) 50 MCG/ACT nasal spray Place 2 sprays into both nostrils daily.   Magnesium Oxide, Elemental, 400 MG TABS Take one tablet per day    ondansetron (ZOFRAN ODT) 4 MG disintegrating tablet Take 1 tablet (4 mg total) by mouth 2 (two) times daily as needed for nausea or vomiting.   propranolol ER (INDERAL LA) 60 MG 24 hr capsule TK 1 C PO D   RABEprazole (ACIPHEX) 20 MG tablet Take 1 tablet (20 mg total) by mouth 2 (two) times daily before a meal.   rizatriptan (MAXALT) 10 MG tablet Take 10 mg by mouth as needed. May repeat in 2 hours if needed   rosuvastatin (CRESTOR) 5 MG tablet Take 1 tablet (5 mg total) by mouth every Monday, Wednesday, and Friday. TAKE 1 TABLET BY MOUTH ON MONDAY, [place].   SUMAtriptan (IMITREX) 100 MG tablet    topiramate (TOPAMAX) 50 MG tablet TAKE 1 AND 1/2 TABLET BY MOUTH IN MORNING AND 1 TABLET BY MOUTH IN EVENING   [DISCONTINUED] amoxicillin (AMOXIL) 500 MG tablet Take 1 tablet (500 mg total) by mouth 2 (two) times daily.   [DISCONTINUED] DULoxetine (CYMBALTA) 60 MG capsule Take 1 capsule (60 mg total) by mouth daily.   [DISCONTINUED] Fe Fum-FePoly-Vit C-Vit B3 (INTEGRA) 62.5-62.5-40-3 MG CAPS TAKE 1 CAPSULE BY MOUTH DAILY   [DISCONTINUED] Magnesium Oxide, Elemental, 400 MG TABS Take one tablet per day   [DISCONTINUED] rosuvastatin (CRESTOR) 5 MG tablet TAKE 1 TABLET BY MOUTH ON MONDAY, WEDNESDAY AND FRIDAY.   [DISCONTINUED] sucralfate (CARAFATE) 1 g tablet Take 1 tablet (1 g total) by mouth 2 (two) times daily as needed.   [DISCONTINUED] sucralfate (CARAFATE) 1 g tablet Take 1 tablet (1 g total) by mouth 2 (two) times daily as needed.   No facility-administered encounter medications on file as of 10/31/2022.     Lab Results  Component Value Date   WBC 3.8 (L) 10/31/2022   HGB 12.9 10/31/2022   HCT 38.3  10/31/2022   PLT 159.0 10/31/2022   GLUCOSE 99 10/31/2022   CHOL 161 10/31/2022   TRIG 80.0 10/31/2022   HDL 61.20 10/31/2022   LDLCALC 83 10/31/2022   ALT 12 10/31/2022   AST 21 10/31/2022   NA 142 10/31/2022   K 3.9 10/31/2022   CL 107 10/31/2022   CREATININE 0.94 10/31/2022    BUN 14 10/31/2022   CO2 29 10/31/2022   TSH 2.40 06/27/2022   HGBA1C 5.5 01/15/2018    MM 3D SCREEN BREAST BILATERAL  Result Date: 07/12/2022 CLINICAL DATA:  Screening. EXAM: DIGITAL SCREENING BILATERAL MAMMOGRAM WITH TOMOSYNTHESIS AND CAD TECHNIQUE: Bilateral screening digital craniocaudal and mediolateral oblique mammograms were obtained. Bilateral screening digital breast tomosynthesis was performed. The images were evaluated with computer-aided detection. COMPARISON:  Previous exam(s). ACR Breast Density Category c: The breast tissue is heterogeneously dense, which may obscure small masses. FINDINGS: There are no findings suspicious for malignancy. IMPRESSION: No mammographic evidence of malignancy. A result letter of this screening mammogram will be mailed directly to the patient. RECOMMENDATION: Screening mammogram in one year. (Code:SM-B-01Y) BI-RADS CATEGORY  1: Negative. Electronically Signed   By: Lajean Manes M.D.   On: 07/12/2022 10:19       Assessment & Plan:  Hypercholesterolemia Assessment & Plan: Continue Crestor.  Low-cholesterol diet and exercise.  Follow lipid panel liver function test.  Orders: -     Lipid panel -     Hepatic function panel -     Basic metabolic panel  Leukopenia, unspecified type Assessment & Plan: Has had persistent decreased white blood cell count/pancytopenia.  Discussed with hematology.  They recommended continuing to follow.  Orders: -     CBC with Differential/Platelet  Estrogen deficiency -     DG Bone Density; Future  Anxiety Assessment & Plan: Continue cymbalta and buspar.  Follow.    Aortic atherosclerosis ([place]) Assessment & Plan: Continue crestor.    Constipation, unspecified constipation type Assessment & Plan: Last seen 05/24/22.  Recommended increase daily water intake and trial of colace.  Also recommended taking daily psyllium. Taking dulcolax qod.  Follow.    Environmental allergies Assessment & Plan: Singulair.   Stable.  Follow.    Gastroesophageal reflux disease, unspecified whether esophagitis present Assessment & Plan: On Aciphex. Stable. Follow.    Hematuria, unspecified type Assessment & Plan: Being followed by urology as outlined.  Being followed for kidney stone.  They recommended follow-up in 1 year with KUB and urinalysis.   Major depressive disorder, single episode, mild (HCC) Assessment & Plan: Continue trazodone, cymbalta and buspar.  Stable.  Follow.    Chronic migraine without aura without status migrainosus, not intractable Assessment & Plan: Followed by neurology.  On topamax and propranolol.  Has aimovig.  Stable.  Follow.    Nausea Assessment & Plan: Intermittent.  Working on keeping bowels moving more regular.  See if improves.  Has been seeing GI.  Also with occasional feeling - hot.  No documented fever.  No signs of infection on exam.  Check routine labs.  Recent tsh wnl.  Will notify if persistent or if desires further w/up or evaluation. Follow.  Call with update.    Pancytopenia [facility]) Assessment & Plan: Have discussed with hematology.  Follow cbc.    Thrombocytopenia ([place]) Assessment & Plan: Have discussed with hematology.  Follow cbc.    Other orders -     DULoxetine HCl; Take 1 capsule (60 mg total) by mouth daily.  Dispense: 90 capsule; Refill:  1 -     Integra; Take 1 capsule by mouth daily.  Dispense: 90 capsule; Refill: 3 -     Magnesium Oxide (Elemental); Take one tablet per day  Dispense: 90 tablet; Refill: 3 -     Rosuvastatin Calcium; Take 1 tablet (5 mg total) by mouth every Monday, Wednesday, and Friday. TAKE 1 TABLET BY MOUTH ON MONDAY, [place].  Dispense: 39 tablet; Refill: 3     Einar Pheasant, MD

## 2022-11-11 ENCOUNTER — Encounter: Payer: Self-pay | Admitting: Internal Medicine

## 2022-11-11 NOTE — Assessment & Plan Note (Addendum)
Intermittent.  Working on keeping bowels moving more regular.  See if improves.  Has been seeing GI.  Also with occasional feeling - hot.  No documented fever.  No signs of infection on exam.  Check routine labs.  Recent tsh wnl.  Will notify if persistent or if desires further w/up or evaluation. Follow.  Call with update.

## 2022-11-11 NOTE — Assessment & Plan Note (Signed)
Followed by neurology.  On topamax and propranolol.  Has aimovig.  Stable.  Follow.

## 2022-11-11 NOTE — Assessment & Plan Note (Signed)
Continue trazodone, cymbalta and buspar.  Stable.  Follow.

## 2022-11-11 NOTE — Assessment & Plan Note (Signed)
Have discussed with hematology.  Follow cbc.

## 2022-11-11 NOTE — Assessment & Plan Note (Signed)
Singulair.  Stable.  Follow.

## 2022-11-11 NOTE — Assessment & Plan Note (Signed)
On Aciphex. Stable. Follow.

## 2022-11-11 NOTE — Assessment & Plan Note (Signed)
Continue crestor 

## 2022-11-11 NOTE — Assessment & Plan Note (Signed)
Continue cymbalta and buspar.  Follow.

## 2022-11-11 NOTE — Assessment & Plan Note (Signed)
Continue Crestor.  Low-cholesterol diet and exercise.  Follow lipid panel liver function test.

## 2022-11-11 NOTE — Assessment & Plan Note (Signed)
Has had persistent decreased white blood cell count/pancytopenia.  Discussed with hematology.  They recommended continuing to follow.

## 2022-11-11 NOTE — Assessment & Plan Note (Signed)
Being followed by urology as outlined.  Being followed for kidney stone.  They recommended follow-up in 1 year with [facility] and urinalysis.

## 2022-11-11 NOTE — Assessment & Plan Note (Signed)
Last seen 05/24/22.  Recommended increase daily water intake and trial of colace.  Also recommended taking daily psyllium. Taking dulcolax qod.  Follow.

## 2022-11-19 ENCOUNTER — Telehealth: Payer: Self-pay

## 2022-11-19 NOTE — Telephone Encounter (Signed)
LM FOR PT TO [facility] ::   I reviewed your recent lab results and your cholesterol levels are ok.  Your white blood cell count  and kidney function tests are stable.  Your hemoglobin and liver function tests are within normal limits.

## 2022-11-23 DIAGNOSIS — F3341 Major depressive disorder, recurrent, in partial remission: Secondary | ICD-10-CM | POA: Diagnosis not present

## 2022-11-23 DIAGNOSIS — F411 Generalized anxiety disorder: Secondary | ICD-10-CM | POA: Diagnosis not present

## 2022-11-23 DIAGNOSIS — F410 Panic disorder [episodic paroxysmal anxiety] without agoraphobia: Secondary | ICD-10-CM | POA: Diagnosis not present

## 2022-11-28 DIAGNOSIS — K080 Exfoliation of teeth due to systemic causes: Secondary | ICD-10-CM | POA: Diagnosis not present

## 2022-11-30 DIAGNOSIS — M5412 Radiculopathy, cervical region: Secondary | ICD-10-CM | POA: Diagnosis not present

## 2022-12-03 ENCOUNTER — Encounter: Payer: Self-pay | Admitting: Internal Medicine

## 2022-12-03 DIAGNOSIS — M5412 Radiculopathy, cervical region: Secondary | ICD-10-CM | POA: Insufficient documentation

## 2022-12-10 ENCOUNTER — Telehealth: Payer: Self-pay | Admitting: Internal Medicine

## 2022-12-10 NOTE — Telephone Encounter (Signed)
Contacted Brandi Ramos to schedule their annual wellness visit. Appointment made for 12/20/2022.  Thank you,  [place] Direct dial  [phone]

## 2022-12-12 DIAGNOSIS — M5412 Radiculopathy, cervical region: Secondary | ICD-10-CM | POA: Diagnosis not present

## 2022-12-20 ENCOUNTER — Ambulatory Visit (INDEPENDENT_AMBULATORY_CARE_PROVIDER_SITE_OTHER): Payer: Medicare Other

## 2022-12-20 VITALS — Ht 64.0 in | Wt 119.0 lb

## 2022-12-20 DIAGNOSIS — Z0000 Encounter for general adult medical examination without abnormal findings: Secondary | ICD-10-CM | POA: Diagnosis not present

## 2022-12-20 NOTE — Patient Instructions (Addendum)
Brandi Ramos , Thank you for taking time to come for your Medicare Wellness Visit. I appreciate your ongoing commitment to your health goals. Please review the following plan we discussed and let me know if I can assist you in the future.   These are the goals we discussed:  Goals       Patient Stated     Follow up with Primary Care Provider (pt-stated)      As needed      strength training exercises (pt-stated)        This is a list of the screening recommended for you and due dates:  Health Maintenance  Topic Date Due   DTaP/Tdap/Td vaccine (1 - Tdap) Never done   DEXA scan (bone density measurement)  Never done   COVID-19 Vaccine (4 - 2023-24 season) 01/05/2023*   Flu Shot  04/11/2023   Mammogram  07/11/2023   Medicare Annual Wellness Visit  12/20/2023   Colon Cancer Screening  09/26/2028   Pneumonia Vaccine  Completed   Hepatitis C Screening: USPSTF Recommendation to screen - Ages 18-79 yo.  Completed   Zoster (Shingles) Vaccine  Completed   HPV Vaccine  Aged Out  *Topic was postponed. The date shown is not the original due date.    Advanced directives: not yet completed  Conditions/risks identified: none new  Next appointment: Follow up in one year for your annual wellness visit    Preventive Care 65 Years and Older, Female Preventive care refers to lifestyle choices and visits with your health care provider that can promote health and wellness. What does preventive care include? A yearly physical exam. This is also called an annual well check. Dental exams once or twice a year. Routine eye exams. Ask your health care provider how often you should have your eyes checked. Personal lifestyle choices, including: Daily care of your teeth and gums. Regular physical activity. Eating a healthy diet. Avoiding tobacco and drug use. Limiting alcohol use. Practicing safe sex. Taking low-dose aspirin every day. Taking vitamin and mineral supplements as recommended by your  health care provider. What happens during an annual well check? The services and screenings done by your health care provider during your annual well check will depend on your age, overall health, lifestyle risk factors, and family history of disease. Counseling  Your health care provider may ask you questions about your: Alcohol use. Tobacco use. Drug use. Emotional well-being. Home and relationship well-being. Sexual activity. Eating habits. History of falls. Memory and ability to understand (cognition). Work and work Astronomer. Reproductive health. Screening  You may have the following tests or measurements: Height, weight, and BMI. Blood pressure. Lipid and cholesterol levels. These may be checked every 5 years, or more frequently if you are over 20 years old. Skin check. Lung cancer screening. You may have this screening every year starting at age 24 if you have a 30-pack-year history of smoking and currently smoke or have quit within the past 15 years. Fecal occult blood test (FOBT) of the stool. You may have this test every year starting at age 4. Flexible sigmoidoscopy or colonoscopy. You may have a sigmoidoscopy every 5 years or a colonoscopy every 10 years starting at age 66. Hepatitis C blood test. Hepatitis B blood test. Sexually transmitted disease (STD) testing. Diabetes screening. This is done by checking your blood sugar (glucose) after you have not eaten for a while (fasting). You may have this done every 1-3 years. Bone density scan. This is done to  screen for osteoporosis. You may have this done starting at age 93. Mammogram. This may be done every 1-2 years. Talk to your health care provider about how often you should have regular mammograms. Talk with your health care provider about your test results, treatment options, and if necessary, the need for more tests. Vaccines  Your health care provider may recommend certain vaccines, such as: Influenza vaccine.  This is recommended every year. Tetanus, diphtheria, and acellular pertussis (Tdap, Td) vaccine. You may need a Td booster every 10 years. Zoster vaccine. You may need this after age 59. Pneumococcal 13-valent conjugate (PCV13) vaccine. One dose is recommended after age 39. Pneumococcal polysaccharide (PPSV23) vaccine. One dose is recommended after age 10. Talk to your health care provider about which screenings and vaccines you need and how often you need them. This information is not intended to replace advice given to you by your health care provider. Make sure you discuss any questions you have with your health care provider. Document Released: 09/23/2015 Document Revised: 05/16/2016 Document Reviewed: 06/28/2015 Elsevier Interactive Patient Education  2017 [employer] Prevention in the Home Falls can cause injuries. They can happen to people of all ages. There are many things you can do to make your home safe and to help prevent falls. What can I do on the outside of my home? Regularly fix the edges of walkways and driveways and fix any cracks. Remove anything that might make you trip as you walk through a door, such as a raised step or threshold. Trim any bushes or trees on the path to your home. Use bright outdoor lighting. Clear any walking paths of anything that might make someone trip, such as rocks or tools. Regularly check to see if handrails are loose or broken. Make sure that both sides of any steps have handrails. Any raised decks and porches should have guardrails on the edges. Have any leaves, snow, or ice cleared regularly. Use sand or salt on walking paths during winter. Clean up any spills in your garage right away. This includes oil or grease spills. What can I do in the bathroom? Use night lights. Install grab bars by the toilet and in the tub and shower. Do not use towel bars as grab bars. Use non-skid mats or decals in the tub or shower. If you need to sit  down in the shower, use a plastic, non-slip stool. Keep the floor dry. Clean up any water that spills on the floor as soon as it happens. Remove soap buildup in the tub or shower regularly. Attach bath mats securely with double-sided non-slip rug tape. Do not have throw rugs and other things on the floor that can make you trip. What can I do in the bedroom? Use night lights. Make sure that you have a light by your bed that is easy to reach. Do not use any sheets or blankets that are too big for your bed. They should not hang down onto the floor. Have a firm chair that has side arms. You can use this for support while you get dressed. Do not have throw rugs and other things on the floor that can make you trip. What can I do in the kitchen? Clean up any spills right away. Avoid walking on wet floors. Keep items that you use a lot in easy-to-reach places. If you need to reach something above you, use a strong step stool that has a grab bar. Keep electrical cords out of the way.  Do not use floor polish or wax that makes floors slippery. If you must use wax, use non-skid floor wax. Do not have throw rugs and other things on the floor that can make you trip. What can I do with my stairs? Do not leave any items on the stairs. Make sure that there are handrails on both sides of the stairs and use them. Fix handrails that are broken or loose. Make sure that handrails are as long as the stairways. Check any carpeting to make sure that it is firmly attached to the stairs. Fix any carpet that is loose or worn. Avoid having throw rugs at the top or bottom of the stairs. If you do have throw rugs, attach them to the floor with carpet tape. Make sure that you have a light switch at the top of the stairs and the bottom of the stairs. If you do not have them, ask someone to add them for you. What else can I do to help prevent falls? Wear shoes that: Do not have high heels. Have rubber bottoms. Are  comfortable and fit you well. Are closed at the toe. Do not wear sandals. If you use a stepladder: Make sure that it is fully opened. Do not climb a closed stepladder. Make sure that both sides of the stepladder are locked into place. Ask someone to hold it for you, if possible. Clearly mark and make sure that you can see: Any grab bars or handrails. First and last steps. Where the edge of each step is. Use tools that help you move around (mobility aids) if they are needed. These include: Canes. Walkers. Scooters. Crutches. Turn on the lights when you go into a dark area. Replace any light bulbs as soon as they burn out. Set up your furniture so you have a clear path. Avoid moving your furniture around. If any of your floors are uneven, fix them. If there are any pets around you, be aware of where they are. Review your medicines with your doctor. Some medicines can make you feel dizzy. This can increase your chance of falling. Ask your doctor what other things that you can do to help prevent falls. This information is not intended to replace advice given to you by your health care provider. Make sure you discuss any questions you have with your health care provider. Document Released: 06/23/2009 Document Revised: 02/02/2016 Document Reviewed: 10/01/2014 Elsevier Interactive Patient Education  2017 [employer].

## 2022-12-20 NOTE — Progress Notes (Signed)
Subjective:   Brandi Ramos is a 77 y.o. female who presents for Medicare Annual (Subsequent) preventive examination.  Review of Systems    No ROS.  Medicare Wellness Virtual Visit.  Visual/audio telehealth visit, UTA vital signs.   See social history for additional risk factors.   Cardiac Risk Factors include: advanced age (>10men, >48 women)     Objective:    Today's Vitals   12/20/22 0841  Weight: 119 lb (54 kg)  Height:  (1.626 m)   Body mass index is 20.43 kg/m.     12/20/2022    8:33 AM 12/06/2021    1:40 PM 04/26/2020    2:16 PM 11/28/2019    1:11 PM 02/04/2019    9:44 AM 10/29/2018    8:54 AM 10/27/2018    1:57 PM  Advanced Directives  Does Patient Have a Medical Advance Directive? No No No No No No No  Would patient like information on creating a medical advance directive? No - Patient declined No - Patient declined No - Patient declined  No - Patient declined No - Patient declined No - Patient declined    Current Medications (verified) Outpatient Encounter Medications as of 12/20/2022  Medication Sig   DULoxetine (CYMBALTA) 60 MG capsule Take 1 capsule (60 mg total) by mouth daily.   Fe Fum-FePoly-Vit C-Vit B3 (INTEGRA) 62.5-62.5-40-3 MG CAPS Take 1 capsule by mouth daily.   fluticasone (FLONASE) 50 MCG/ACT nasal spray Place 2 sprays into both nostrils daily.   Magnesium Oxide, Elemental, 400 MG TABS Take one tablet per day   ondansetron (ZOFRAN ODT) 4 MG disintegrating tablet Take 1 tablet (4 mg total) by mouth 2 (two) times daily as needed for nausea or vomiting.   propranolol ER (INDERAL LA) 60 MG 24 hr capsule TK 1 C PO D   RABEprazole (ACIPHEX) 20 MG tablet Take 1 tablet (20 mg total) by mouth 2 (two) times daily before a meal.   rizatriptan (MAXALT) 10 MG tablet Take 10 mg by mouth as needed. May repeat in 2 hours if needed   rosuvastatin (CRESTOR) 5 MG tablet Take 1 tablet (5 mg total) by mouth every Monday, Wednesday, and Friday. TAKE 1 TABLET BY  MOUTH ON MONDAY, WEDNESDAY AND FRIDAY.   sucralfate (CARAFATE) 1 g tablet TAKE 1 TABLET(1 GRAM) BY MOUTH TWICE DAILY AS NEEDED   SUMAtriptan (IMITREX) 100 MG tablet    topiramate (TOPAMAX) 50 MG tablet TAKE 1 AND 1/2 TABLET BY MOUTH IN MORNING AND 1 TABLET BY MOUTH IN EVENING   No facility-administered encounter medications on file as of 12/20/2022.    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    Past Surgical History:  Procedure Laterality Date   ABDOMINAL HYSTERECTOMY  1984   COLONOSCOPY  COLONOSCOPY WITH PROPOFOL N/A 09/26/2018   Procedure: COLONOSCOPY WITH PROPOFOL;  Surgeon: Christena Deem, MD;  Location: [facility] ENDOSCOPY;  Service: Endoscopy;  Laterality: N/A;   ESOPHAGOGASTRODUODENOSCOPY (EGD) WITH PROPOFOL N/A 10/31/2015   Procedure: ESOPHAGOGASTRODUODENOSCOPY (EGD) WITH PROPOFOL;  Surgeon: Christena Deem, MD;  Location: [facility] ENDOSCOPY;  Service: Endoscopy;  Laterality: N/A;   ESOPHAGOGASTRODUODENOSCOPY (EGD) WITH PROPOFOL N/A 09/26/2018   Procedure: ESOPHAGOGASTRODUODENOSCOPY (EGD) WITH PROPOFOL;  Surgeon: Christena Deem, MD;  Location: [facility] ENDOSCOPY;  Service: Endoscopy;  Laterality: N/A;   LAPAROSCOPIC APPENDECTOMY N/A 10/29/2018   Procedure: APPENDECTOMY LAPAROSCOPIC;  Surgeon: Carolan Shiver, MD;  Location: [facility] ORS;  Service: General;  Laterality: N/A;   LAPAROSCOPIC BILATERAL SALPINGO  OOPHERECTOMY Bilateral 10/29/2018   Procedure: LAPAROSCOPIC BILATERAL SALPINGO OOPHORECTOMY;  Surgeon: Ward, Elenora Fender, MD;  Location: [facility] ORS;  Service: Gynecology;  Laterality: Bilateral;   Family History  Problem Relation Age of Onset   CAD Mother        s/p CABG   Hypercholesterolemia Mother    Breast cancer Mother 76   Heart disease Mother    Lung cancer Mother    Hypertension Father    Migraines Father    Hypercholesterolemia Father    Lung cancer Father    Migraines Sister    Arthritis Sister    Lymphoma Other        grandmother   Colon cancer Neg Hx    Social History   Socioeconomic History   Marital status: Widowed    Spouse name: Not on file   Number of children: 2   Years of education: Not on file   Highest education level: Not on file  Occupational History   Not on file  Tobacco Use   Smoking status: Never   Smokeless tobacco: Never  Vaping Use   Vaping Use: Never used  Substance and Sexual Activity   Alcohol use: Yes    Alcohol/week: 0.0 standard drinks of alcohol    Comment: wine occassionally   Drug use: No   Sexual activity: Not Currently  Other Topics Concern   Not on file  Social History Narrative   Not on file   Social Determinants of Health   Financial Resource Strain: Low Risk  (12/20/2022)   Overall Financial Resource Strain (CARDIA)    Difficulty of Paying Living Expenses: Not hard at all  Food Insecurity: No Food Insecurity (12/20/2022)   Hunger Vital Sign    Worried About Running Out of Food in the Last Year: Never true    Ran Out of Food in the Last Year: Never true  Transportation Needs: No Transportation Needs (12/20/2022)   PRAPARE - Administrator, Civil Service (Medical): No    Lack of Transportation (Non-Medical): No  Physical Activity: Insufficiently Active (12/20/2022)   Exercise Vital Sign    Days of Exercise per Week: 4 days    Minutes of Exercise per Session: 30 min  Stress: No Stress Concern Present  (12/20/2022)   [employer] of Occupational Health - Occupational Stress Questionnaire    Feeling of Stress : Not at all  Social Connections: Moderately Integrated (12/20/2022)   Social Connection and Isolation Panel [NHANES]    Frequency of Communication with Friends and Family: More than three times a week    Frequency of Social Gatherings with Friends and Family: More than three times a week    Attends Religious Services: More than 4 times per year    Active Member of [employer] or Organizations:  Yes    Attends Club or Organization Meetings: More than 4 times per year    Marital Status: Widowed    Tobacco Counseling Counseling given: Not Answered   Clinical Intake:  Pre-visit preparation completed: Yes        Diabetes: No  How often do you need to have someone help you when you read instructions, pamphlets, or other written materials from your doctor or pharmacy?: 1 - Never    Interpreter Needed?: No      Activities of Daily Living    12/20/2022    8:34 AM  In your present state of health, do you have any difficulty performing the following activities:  Hearing? 0  Vision? 0  Difficulty concentrating or making decisions? 0  Walking or climbing stairs? 0  Comment Paces self with activity; leg weakness  Dressing or bathing? 0  Doing errands, shopping? 0  Preparing Food and eating ? N  Using the Toilet? N  In the past six months, have you accidently leaked urine? N  Do you have problems with loss of bowel control? N  Managing your Medications? N  Managing your Finances? N  Housekeeping or managing your Housekeeping? N    Patient Care Team: Dale DurhamScott, Charlene, MD as PCP - General (Internal Medicine)  Indicate any recent Medical Services you may have received from other than [facility] providers in the past year (date may be approximate).     Assessment:   This is a routine wellness examination for Brandi Ramos.  I connected with  Brandi Ramos on 12/20/22 by a audio  enabled telemedicine application and verified that I am speaking with the correct person using two identifiers.  Patient Location: Home  Provider Location: Office/Clinic  I discussed the limitations of evaluation and management by telemedicine. The patient expressed understanding and agreed to proceed.   Hearing/Vision screen Hearing Screening - Comments:: Patient is able to hear conversational tones without difficulty.  No issues reported.   Vision Screening - Comments:: [facility] Appointment with Dr. Inez PilgrimBrasington Wears corrective lenses They have regular follow up with the ophthalmologist Glaucoma suspect; visits every 6 month    Dietary issues and exercise activities discussed: Current Exercise Habits: Home exercise routine, Type of exercise: walking, Time (Minutes): 30, Frequency (Times/Week): 4, Weekly Exercise (Minutes/Week): 120, Intensity: Mild Regular diet   Goals Addressed               This Visit's Progress     Patient Stated     strength training exercises (pt-stated)         Depression Screen    12/20/2022    8:32 AM 10/19/2022    8:32 AM 06/27/2022    8:16 AM 04/05/2022    9:59 AM 01/18/2022    8:30 AM 12/06/2021    1:37 PM 09/20/2021    8:45 AM  PHQ 2/9 Scores  PHQ - 2 Score 0 1 1 1  0 0 0  PHQ- 9 Score  2  2       Fall Risk    12/20/2022    8:33 AM 10/19/2022    8:32 AM 06/27/2022    8:16 AM 04/05/2022    9:59 AM 01/18/2022    8:30 AM  Fall Risk   Falls in the past year? 0 0 0 0 0  Number falls in past yr: 0 0 0 0   Injury with Fall? 0 0 0 0   Risk for fall due to :  No  Fall Risks No Fall Risks No Fall Risks No Fall Risks  Follow up Falls evaluation completed;Falls prevention discussed Falls evaluation completed Falls evaluation completed Falls evaluation completed Falls evaluation completed    FALL RISK PREVENTION PERTAINING TO THE HOME: Home free of loose throw rugs in walkways, pet beds, electrical cords, etc? Yes   Adequate lighting in your home to reduce risk of falls? Yes   ASSISTIVE DEVICES UTILIZED TO PREVENT FALLS: Life alert? No  Use of a cane, walker or w/c? No Grab bars in the bathroom? No  Shower chair or bench in shower? No  Elevated toilet seat or a handicapped toilet? No   TIMED UP AND GO: Was the test performed? No .   Cognitive Function:    11/13/2016    9:41 AM  MMSE - Mini Mental State Exam  Orientation to time 5  Orientation to Place 5  Registration 3  Attention/ Calculation 5  Recall 3  Language- name 2 objects 2  Language- repeat 1  Language- follow 3 step command 3  Language- read & follow direction 1  Write a sentence 1  Copy design 1  Total score 30        12/20/2022    8:36 AM 04/26/2020    2:26 PM 02/04/2019    9:46 AM  6CIT Screen  What Year? 0 points 0 points 0 points  What month? 0 points 0 points 0 points  What time? 0 points  0 points  Count back from 20 0 points  0 points  Months in reverse 0 points 0 points 0 points  Repeat phrase 0 points 0 points 0 points  Total Score 0 points  0 points    Immunizations Immunization History  Administered Date(s) Administered   Fluad Quad(high Dose 65+) 07/11/2021   Influenza Split 06/15/2014   Influenza, High Dose Seasonal PF 10/11/2016, 05/17/2017, 06/30/2018, 06/04/2019   Influenza,inj,Quad PF,6+ Mos 07/01/2013, 06/27/2015   Influenza-Unspecified 06/30/2018, 07/11/2020   Moderna Sars-Covid-2 Vaccination 10/19/2019, 11/16/2019, 08/28/2020   Pneumococcal Conjugate-13 11/13/2016   Pneumococcal Polysaccharide-23 08/27/2018   Zoster Recombinat (Shingrix) 09/20/2021, 02/28/2022   Zoster, Live 05/11/2014    TDAP status: Due, Education has been provided regarding the importance of this vaccine. Advised may receive this vaccine at local pharmacy or Health Dept. Aware to provide a copy of the vaccination record if obtained from local pharmacy or Health Dept. Verbalized acceptance and understanding.  Covid-19  vaccine status: Completed vaccines x3.  Screening Tests Health Maintenance  Topic Date Due   DTaP/Tdap/Td (1 - Tdap) Never done   DEXA SCAN  Never done   COVID-19 Vaccine (4 - 2023-24 season) 01/05/2023 (Originally 05/11/2022)   INFLUENZA VACCINE  04/11/2023   MAMMOGRAM  07/11/2023   Medicare Annual Wellness (AWV)  12/20/2023   COLONOSCOPY (Pts 45-52yrs Insurance coverage will need to be confirmed)  09/26/2028   Pneumonia Vaccine 56+ Years old  Completed   Hepatitis C Screening  Completed   Zoster Vaccines- Shingrix  Completed   HPV VACCINES  Aged Out    Health Maintenance Health Maintenance Due  Topic Date Due   DTaP/Tdap/Td (1 - Tdap) Never done   DEXA SCAN  Never done   Lung Cancer Screening: (Low Dose CT Chest recommended if Age 2-80 years, 30 pack-year currently smoking OR have quit w/in 15years.) does not qualify.   Hepatitis C Screening: Completed 05/2020.  Vision Screening: Recommended annual ophthalmology exams for early detection of glaucoma and other disorders of the eye.  Dental  Screening: Recommended annual dental exams for proper oral hygiene  Community Resource Referral / Chronic Care Management: CRR required this visit?  No   CCM required this visit?  No      Plan:     I have personally reviewed and noted the following in the patient's chart:   Medical and social history Use of alcohol, tobacco or illicit drugs  Current medications and supplements including opioid prescriptions. Patient is not currently taking opioid prescriptions. Functional ability and status Nutritional status Physical activity Advanced directives List of other physicians Hospitalizations, surgeries, and ER visits in previous 12 months Vitals Screenings to include cognitive, depression, and falls Referrals and appointments  In addition, I have reviewed and discussed with patient certain preventive protocols, quality metrics, and best practice recommendations. A written  personalized care plan for preventive services as well as general preventive health recommendations were provided to patient.     Cathey Endow, LPN   1/61/0960

## 2022-12-28 DIAGNOSIS — M5412 Radiculopathy, cervical region: Secondary | ICD-10-CM | POA: Diagnosis not present

## 2023-01-09 DIAGNOSIS — H40003 Preglaucoma, unspecified, bilateral: Secondary | ICD-10-CM | POA: Diagnosis not present

## 2023-01-22 DIAGNOSIS — H2513 Age-related nuclear cataract, bilateral: Secondary | ICD-10-CM | POA: Diagnosis not present

## 2023-01-22 DIAGNOSIS — H40003 Preglaucoma, unspecified, bilateral: Secondary | ICD-10-CM | POA: Diagnosis not present

## 2023-01-22 DIAGNOSIS — D3131 Benign neoplasm of right choroid: Secondary | ICD-10-CM | POA: Diagnosis not present

## 2023-01-24 DIAGNOSIS — G4701 Insomnia due to medical condition: Secondary | ICD-10-CM | POA: Diagnosis not present

## 2023-01-24 DIAGNOSIS — G4486 Cervicogenic headache: Secondary | ICD-10-CM | POA: Diagnosis not present

## 2023-01-24 DIAGNOSIS — G43719 Chronic migraine without aura, intractable, without status migrainosus: Secondary | ICD-10-CM | POA: Diagnosis not present

## 2023-01-24 DIAGNOSIS — M47812 Spondylosis without myelopathy or radiculopathy, cervical region: Secondary | ICD-10-CM | POA: Diagnosis not present

## 2023-01-24 DIAGNOSIS — R52 Pain, unspecified: Secondary | ICD-10-CM | POA: Diagnosis not present

## 2023-02-22 ENCOUNTER — Telehealth: Payer: Self-pay | Admitting: Internal Medicine

## 2023-02-22 ENCOUNTER — Other Ambulatory Visit: Payer: Self-pay

## 2023-02-22 DIAGNOSIS — F410 Panic disorder [episodic paroxysmal anxiety] without agoraphobia: Secondary | ICD-10-CM | POA: Diagnosis not present

## 2023-02-22 DIAGNOSIS — F3341 Major depressive disorder, recurrent, in partial remission: Secondary | ICD-10-CM | POA: Diagnosis not present

## 2023-02-22 DIAGNOSIS — F411 Generalized anxiety disorder: Secondary | ICD-10-CM | POA: Diagnosis not present

## 2023-02-22 MED ORDER — ROSUVASTATIN CALCIUM 5 MG PO TABS: 5.0000 mg | ORAL_TABLET | ORAL | 3 refills | Status: DC

## 2023-02-22 NOTE — Telephone Encounter (Signed)
Prescription Request  02/22/2023  LOV: 10/31/2022  What is the name of the medication or equipment? rosuvastatin (CRESTOR) 5 MG tablet   Have you contacted your pharmacy to request a refill? Yes   Which pharmacy would you like this sent to?  [id number] [place], [place] - [address] AT [facility] OF [address] [place] [address] Phone: [phone] Fax: [phone]     Patient notified that their request is being sent to the clinical staff for review and that they should receive a response within 2 business days.   Please advise at [facility] [phone]

## 2023-02-22 NOTE — Telephone Encounter (Signed)
Medication refilled

## 2023-03-01 ENCOUNTER — Ambulatory Visit: Payer: Medicare Other | Admitting: Internal Medicine

## 2023-03-27 ENCOUNTER — Encounter: Payer: Self-pay | Admitting: Internal Medicine

## 2023-03-27 ENCOUNTER — Ambulatory Visit (INDEPENDENT_AMBULATORY_CARE_PROVIDER_SITE_OTHER): Payer: Medicare Other | Admitting: Internal Medicine

## 2023-03-27 VITALS — BP 120/70 | HR 71 | Temp 97.9°F | Resp 16 | Ht 64.0 in | Wt 116.6 lb

## 2023-03-27 DIAGNOSIS — E7800 Pure hypercholesterolemia, unspecified: Secondary | ICD-10-CM

## 2023-03-27 DIAGNOSIS — D72819 Decreased white blood cell count, unspecified: Secondary | ICD-10-CM | POA: Diagnosis not present

## 2023-03-27 DIAGNOSIS — D61818 Other pancytopenia: Secondary | ICD-10-CM

## 2023-03-27 DIAGNOSIS — D2262 Melanocytic nevi of left upper limb, including shoulder: Secondary | ICD-10-CM | POA: Diagnosis not present

## 2023-03-27 DIAGNOSIS — L570 Actinic keratosis: Secondary | ICD-10-CM | POA: Diagnosis not present

## 2023-03-27 DIAGNOSIS — K2960 Other gastritis without bleeding: Secondary | ICD-10-CM | POA: Diagnosis not present

## 2023-03-27 DIAGNOSIS — D225 Melanocytic nevi of trunk: Secondary | ICD-10-CM | POA: Diagnosis not present

## 2023-03-27 DIAGNOSIS — R1011 Right upper quadrant pain: Secondary | ICD-10-CM

## 2023-03-27 DIAGNOSIS — G43709 Chronic migraine without aura, not intractable, without status migrainosus: Secondary | ICD-10-CM

## 2023-03-27 DIAGNOSIS — D2271 Melanocytic nevi of right lower limb, including hip: Secondary | ICD-10-CM | POA: Diagnosis not present

## 2023-03-27 DIAGNOSIS — F320 Major depressive disorder, single episode, mild: Secondary | ICD-10-CM

## 2023-03-27 DIAGNOSIS — F419 Anxiety disorder, unspecified: Secondary | ICD-10-CM

## 2023-03-27 DIAGNOSIS — I700 Atherosclerosis of aorta: Secondary | ICD-10-CM

## 2023-03-27 DIAGNOSIS — K5900 Constipation, unspecified: Secondary | ICD-10-CM

## 2023-03-27 DIAGNOSIS — K219 Gastro-esophageal reflux disease without esophagitis: Secondary | ICD-10-CM

## 2023-03-27 DIAGNOSIS — D2261 Melanocytic nevi of right upper limb, including shoulder: Secondary | ICD-10-CM | POA: Diagnosis not present

## 2023-03-27 LAB — CBC WITH DIFFERENTIAL/PLATELET
Basophils Absolute: 0 10*3/uL (ref 0.0–0.1)
Basophils Relative: 0.6 % (ref 0.0–3.0)
Eosinophils Absolute: 0.1 10*3/uL (ref 0.0–0.7)
Eosinophils Relative: 3.5 % (ref 0.0–5.0)
HCT: 37.5 % (ref 36.0–46.0)
Hemoglobin: 12.4 g/dL (ref 12.0–15.0)
Lymphocytes Relative: 42.5 % (ref 12.0–46.0)
Lymphs Abs: 1.8 10*3/uL (ref 0.7–4.0)
MCHC: 33.1 g/dL (ref 30.0–36.0)
MCV: 89 fl (ref 78.0–100.0)
Monocytes Absolute: 0.4 10*3/uL (ref 0.1–1.0)
Monocytes Relative: 9.9 % (ref 3.0–12.0)
Neutro Abs: 1.8 10*3/uL (ref 1.4–7.7)
Neutrophils Relative %: 43.5 % (ref 43.0–77.0)
Platelets: 144 10*3/uL — ABNORMAL LOW (ref 150.0–400.0)
RBC: 4.21 Mil/uL (ref 3.87–5.11)
RDW: 13.9 % (ref 11.5–15.5)
WBC: 4.2 10*3/uL (ref 4.0–10.5)

## 2023-03-27 LAB — BASIC METABOLIC PANEL
BUN: 15 mg/dL (ref 6–23)
CO2: 29 mEq/L (ref 19–32)
Calcium: 9.3 mg/dL (ref 8.4–10.5)
Chloride: 105 mEq/L (ref 96–112)
Creatinine, Ser: 0.9 mg/dL (ref 0.40–1.20)
GFR: 62.88 mL/min (ref >60.00)
Glucose, Bld: 96 mg/dL (ref 70–99)
Potassium: 4.5 mEq/L (ref 3.5–5.1)
Sodium: 140 mEq/L (ref 135–145)

## 2023-03-27 LAB — HEPATIC FUNCTION PANEL
ALT: 12 U/L (ref 0–35)
AST: 22 U/L (ref 0–37)
Albumin: 4 g/dL (ref 3.5–5.2)
Alkaline Phosphatase: 54 U/L (ref 39–117)
Bilirubin, Direct: 0.1 mg/dL (ref 0.0–0.3)
Total Bilirubin: 0.5 mg/dL (ref 0.2–1.2)
Total Protein: 6.6 g/dL (ref 6.0–8.3)

## 2023-03-27 LAB — LIPID PANEL
Cholesterol: 158 mg/dL (ref 0–200)
HDL: 54.4 mg/dL (ref >39.00)
LDL Cholesterol: 85 mg/dL (ref 0–99)
NonHDL: 103.73
Total CHOL/HDL Ratio: 3
Triglycerides: 94 mg/dL (ref 0.0–149.0)
VLDL: 18.8 mg/dL (ref 0.0–40.0)

## 2023-03-27 MED ORDER — DULOXETINE HCL 60 MG PO CPEP: 60.0000 mg | ORAL_CAPSULE | Freq: Every day | ORAL | 1 refills | Status: DC

## 2023-03-27 MED ORDER — RABEPRAZOLE SODIUM 20 MG PO TBEC
20.0000 mg | DELAYED_RELEASE_TABLET | Freq: Two times a day (BID) | ORAL | 1 refills | Status: DC
Start: 2023-03-27 — End: 2023-11-01

## 2023-03-27 NOTE — Progress Notes (Signed)
Subjective:    Patient ID: Brandi Ramos, female    DOB: November 22, 1945, 77 y.o.   MRN: [id number]  Patient here for  Chief Complaint  Patient presents with   Medical Management of Chronic Issues    HPI Here to follow up regarding increased stress and anxiety.  On cymbalta and buspar. Also f/u regarding hypercholesterolemia.  Saw Dr Park Breed (neurology) - f/u headaches. Recommended topamax (split dose) and continue propranolol. Maxalt prn. Taking trazodone.  Tries to stay active.  No chest pain or sob reported. Has started back on carafate.  Has seen GI - f/u constipation.    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    Past Surgical History:  Procedure Laterality Date   ABDOMINAL HYSTERECTOMY  1984   COLONOSCOPY     COLONOSCOPY WITH PROPOFOL N/A 09/26/2018   Procedure: COLONOSCOPY WITH PROPOFOL;  Surgeon: Christena Deem, MD;  Location: [facility] ENDOSCOPY;  Service: Endoscopy;  Laterality: N/A;   ESOPHAGOGASTRODUODENOSCOPY (EGD) WITH PROPOFOL N/A 10/31/2015   Procedure: ESOPHAGOGASTRODUODENOSCOPY (EGD) WITH PROPOFOL;  Surgeon: Christena Deem, MD;  Location: [facility] ENDOSCOPY;  Service: Endoscopy;  Laterality: N/A;   ESOPHAGOGASTRODUODENOSCOPY (EGD) WITH PROPOFOL N/A 09/26/2018   Procedure: ESOPHAGOGASTRODUODENOSCOPY (EGD) WITH PROPOFOL;  Surgeon: Christena Deem, MD;  Location: [facility] ENDOSCOPY;  Service: Endoscopy;  Laterality: N/A;   LAPAROSCOPIC APPENDECTOMY N/A 10/29/2018   Procedure:  APPENDECTOMY LAPAROSCOPIC;  Surgeon: Carolan Shiver, MD;  Location: [facility] ORS;  Service: General;  Laterality: N/A;   LAPAROSCOPIC BILATERAL SALPINGO OOPHERECTOMY Bilateral 10/29/2018   Procedure: LAPAROSCOPIC BILATERAL SALPINGO OOPHORECTOMY;  Surgeon: Ward, Elenora Fender, MD;  Location: [facility] ORS;  Service: Gynecology;  Laterality: Bilateral;   Family History  Problem Relation Age of Onset   CAD Mother        s/p CABG   Hypercholesterolemia Mother    Breast cancer Mother 55   Heart disease Mother    Lung cancer Mother    Hypertension Father    Migraines Father    Hypercholesterolemia Father    Lung cancer Father    Migraines Sister    Arthritis Sister    Lymphoma Other        grandmother   Colon cancer Neg Hx    Social History   Socioeconomic History   Marital status: Widowed    Spouse name: Not on file   Number of children: 2   Years of education: Not on file   Highest education level: Not on file  Occupational History   Not on file  Tobacco Use   Smoking status: Never   Smokeless tobacco: Never  Vaping Use   Vaping status: Never Used  Substance and Sexual Activity   Alcohol use: Yes    Alcohol/week: 0.0 standard drinks of alcohol    Comment: wine occassionally   Drug use: No   Sexual  activity: Not Currently  Other Topics Concern   Not on file  Social History Narrative   Not on file   Social Determinants of Health   Financial Resource Strain: Low Risk  (12/20/2022)   Overall Financial Resource Strain (CARDIA)    Difficulty of Paying Living Expenses: Not hard at all  Food Insecurity: No Food Insecurity (12/20/2022)   Hunger Vital Sign    Worried About Running Out of Food in the Last Year: Never true    Ran Out of Food in the Last Year: Never true  Transportation Needs: No Transportation Needs (12/20/2022)   PRAPARE - Administrator, Civil Service (Medical): No    Lack of Transportation (Non-Medical): No  Physical Activity: Insufficiently Active  (12/20/2022)   Exercise Vital Sign    Days of Exercise per Week: 4 days    Minutes of Exercise per Session: 30 min  Stress: No Stress Concern Present (12/20/2022)   [employer] of Occupational Health - Occupational Stress Questionnaire    Feeling of Stress : Not at all  Social Connections: Moderately Integrated (12/20/2022)   Social Connection and Isolation Panel [NHANES]    Frequency of Communication with Friends and Family: More than three times a week    Frequency of Social Gatherings with Friends and Family: More than three times a week    Attends Religious Services: More than 4 times per year    Active Member of [employer] or Organizations: Yes    Attends Banker Meetings: More than 4 times per year    Marital Status: Widowed     Review of Systems  Constitutional:  Negative for appetite change and unexpected weight change.  HENT:  Negative for congestion and sinus pressure.   Respiratory:  Negative for cough, chest tightness and shortness of breath.   Cardiovascular:  Negative for chest pain, palpitations and leg swelling.  Gastrointestinal:  Positive for constipation. Negative for abdominal pain, diarrhea, nausea and vomiting.  Genitourinary:  Negative for difficulty urinating and dysuria.  Musculoskeletal:  Negative for joint swelling and myalgias.  Skin:  Negative for color change and rash.  Neurological:  Negative for dizziness.       Seeing neurology - f/u headaches.  Stable.   Psychiatric/Behavioral:  Negative for agitation.        Increased stress.        Objective:     BP 120/70   Pulse 71   Temp 97.9 F (36.6 C)   Resp 16   Ht 5\' 4"  (1.626 m)   Wt 116 lb 9.6 oz (52.9 kg)   SpO2 98%   BMI 20.01 kg/m  Wt Readings from Last 3 Encounters:  03/27/23 116 lb 9.6 oz (52.9 kg)  12/20/22 119 lb (54 kg)  10/31/22 119 lb (54 kg)    Physical Exam Vitals reviewed.  Constitutional:      General: She is not in acute distress.    Appearance: Normal  appearance.  HENT:     Head: Normocephalic and atraumatic.     Right Ear: External ear normal.     Left Ear: External ear normal.  Eyes:     General: No scleral icterus.       Right eye: No discharge.        Left eye: No discharge.     Conjunctiva/sclera: Conjunctivae normal.  Neck:     Thyroid: No thyromegaly.  Cardiovascular:     Rate and Rhythm: Normal rate and regular rhythm.  Pulmonary:  Effort: No respiratory distress.     Breath sounds: Normal breath sounds. No wheezing.  Abdominal:     General: Bowel sounds are normal.     Palpations: Abdomen is soft.     Tenderness: There is no abdominal tenderness.  Musculoskeletal:        General: No swelling or tenderness.     Cervical back: Neck supple. No tenderness.  Lymphadenopathy:     Cervical: No cervical adenopathy.  Skin:    Findings: No erythema or rash.  Neurological:     Mental Status: She is alert.  Psychiatric:        Mood and Affect: Mood normal.        Behavior: Behavior normal.      Outpatient Encounter Medications as of 03/27/2023  Medication Sig   DULoxetine (CYMBALTA) 60 MG capsule Take 1 capsule (60 mg total) by mouth daily.   Fe Fum-FePoly-Vit C-Vit B3 (INTEGRA) 62.5-62.5-40-3 MG CAPS Take 1 capsule by mouth daily.   fluticasone (FLONASE) 50 MCG/ACT nasal spray Place 2 sprays into both nostrils daily.   Magnesium Oxide, Elemental, 400 MG TABS Take one tablet per day   ondansetron (ZOFRAN ODT) 4 MG disintegrating tablet Take 1 tablet (4 mg total) by mouth 2 (two) times daily as needed for nausea or vomiting.   propranolol ER (INDERAL LA) 60 MG 24 hr capsule TK 1 C PO D   RABEprazole (ACIPHEX) 20 MG tablet Take 1 tablet (20 mg total) by mouth 2 (two) times daily before a meal.   rizatriptan (MAXALT) 10 MG tablet Take 10 mg by mouth as needed. May repeat in 2 hours if needed   sucralfate (CARAFATE) 1 g tablet TAKE 1 TABLET(1 GRAM) BY MOUTH TWICE DAILY AS NEEDED   SUMAtriptan (IMITREX) 100 MG tablet     topiramate (TOPAMAX) 50 MG tablet TAKE 1 AND 1/2 TABLET BY MOUTH IN MORNING AND 1 TABLET BY MOUTH IN EVENING   [DISCONTINUED] DULoxetine (CYMBALTA) 60 MG capsule Take 1 capsule (60 mg total) by mouth daily.   [DISCONTINUED] RABEprazole (ACIPHEX) 20 MG tablet Take 1 tablet (20 mg total) by mouth 2 (two) times daily before a meal.   [DISCONTINUED] rosuvastatin (CRESTOR) 5 MG tablet Take 1 tablet (5 mg total) by mouth every Monday, Wednesday, and Friday. TAKE 1 TABLET BY MOUTH ON MONDAY, WEDNESDAY AND FRIDAY.   No facility-administered encounter medications on file as of 03/27/2023.     Lab Results  Component Value Date   WBC 4.2 03/27/2023   HGB 12.4 03/27/2023   HCT 37.5 03/27/2023   PLT 144.0 (L) 03/27/2023   GLUCOSE 96 03/27/2023   CHOL 158 03/27/2023   TRIG 94.0 03/27/2023   HDL 54.40 03/27/2023   LDLCALC 85 03/27/2023   ALT 12 03/27/2023   AST 22 03/27/2023   NA 140 03/27/2023   K 4.5 03/27/2023   CL 105 03/27/2023   CREATININE 0.90 03/27/2023   BUN 15 03/27/2023   CO2 29 03/27/2023   TSH 2.40 06/27/2022   HGBA1C 5.5 01/15/2018    MM 3D SCREEN BREAST BILATERAL  Result Date: 07/12/2022 CLINICAL DATA:  Screening. EXAM: DIGITAL SCREENING BILATERAL MAMMOGRAM WITH TOMOSYNTHESIS AND CAD TECHNIQUE: Bilateral screening digital craniocaudal and mediolateral oblique mammograms were obtained. Bilateral screening digital breast tomosynthesis was performed. The images were evaluated with computer-aided detection. COMPARISON:  Previous exam(s). ACR Breast Density Category c: The breast tissue is heterogeneously dense, which may obscure small masses. FINDINGS: There are no findings suspicious for malignancy. IMPRESSION: No  mammographic evidence of malignancy. A result letter of this screening mammogram will be mailed directly to the patient. RECOMMENDATION: Screening mammogram in one year. (Code:SM-B-01Y) BI-RADS CATEGORY  1: Negative. Electronically Signed   By: Amie Portland M.D.   On:  07/12/2022 10:19       Assessment & Plan:  Hypercholesterolemia Assessment & Plan: Continue Crestor.  Low-cholesterol diet and exercise.  Follow lipid panel liver function test.  Orders: -     Lipid panel -     Hepatic function panel -     Basic metabolic panel  Gastritis, bile acid reflux -     RABEprazole Sodium; Take 1 tablet (20 mg total) by mouth 2 (two) times daily before a meal.  Dispense: 180 tablet; Refill: 1  Leukopenia, unspecified type -     CBC with Differential/Platelet  RUQ pain -     US Abdomen Complete; Future  Anxiety Assessment & Plan: Continue cymbalta and buspar.  Discussed.  She does not feel needs any further intervention.  Follow.    Aortic atherosclerosis (HCC) Assessment & Plan: Continue crestor.    Constipation, unspecified constipation type Assessment & Plan: Has seen GI. Recommended increase daily water intake and trial of colace.  Also recommended taking daily psyllium. Dulcolax qod.  Follow.    Gastroesophageal reflux disease, unspecified whether esophagitis present Assessment & Plan: On Aciphex. She added carafate. Follow.    Major depressive disorder, single episode, mild (HCC) Assessment & Plan: Continue trazodone, cymbalta and buspar.  Stable.  Follow.    Chronic migraine without aura without status migrainosus, not intractable Assessment & Plan: Followed by neurology.  On topamax and propranolol.  Has aimovig.  Stable.  Follow.    Pancytopenia [facility]) Assessment & Plan: Have discussed with hematology.  Follow cbc.    Other orders -     DULoxetine HCl; Take 1 capsule (60 mg total) by mouth daily.  Dispense: 90 capsule; Refill: 1     Dale Norton, MD

## 2023-03-29 ENCOUNTER — Other Ambulatory Visit: Payer: Self-pay

## 2023-03-29 DIAGNOSIS — D696 Thrombocytopenia, unspecified: Secondary | ICD-10-CM

## 2023-03-29 MED ORDER — ROSUVASTATIN CALCIUM 5 MG PO TABS: 5.0000 mg | ORAL_TABLET | Freq: Every day | ORAL | 3 refills | Status: DC

## 2023-03-30 ENCOUNTER — Encounter: Payer: Self-pay | Admitting: Internal Medicine

## 2023-03-30 NOTE — Assessment & Plan Note (Signed)
Has seen GI. Recommended increase daily water intake and trial of colace.  Also recommended taking daily psyllium. Dulcolax qod.  Follow.

## 2023-03-30 NOTE — Assessment & Plan Note (Signed)
Continue cymbalta and buspar.  Discussed.  She does not feel needs any further intervention.  Follow.

## 2023-03-30 NOTE — Assessment & Plan Note (Signed)
Continue crestor 

## 2023-03-30 NOTE — Assessment & Plan Note (Signed)
Have discussed with hematology.  Follow cbc.

## 2023-03-30 NOTE — Assessment & Plan Note (Signed)
Continue trazodone, cymbalta and buspar.  Stable.  Follow.  

## 2023-03-30 NOTE — Assessment & Plan Note (Signed)
Continue Crestor.  Low-cholesterol diet and exercise.  Follow lipid panel liver function test. 

## 2023-03-30 NOTE — Assessment & Plan Note (Addendum)
On Aciphex. She added carafate. Follow.

## 2023-03-30 NOTE — Assessment & Plan Note (Signed)
Followed by neurology.  On topamax and propranolol.  Has aimovig.  Stable.  Follow.

## 2023-03-31 NOTE — Assessment & Plan Note (Signed)
Describes intermittent right upper quadrant pain/discomfort as outlined.  Started herself back on carafate.  Continues on aciphex.  Given persistent increased intermittent pain, will check abdominal ultrasound.  Further intervention pending results.

## 2023-04-01 ENCOUNTER — Ambulatory Visit
Admission: RE | Admit: 2023-04-01 | Discharge: 2023-04-01 | Disposition: A | Discharge status: Home / Self Care | Payer: Medicare Other | Source: Ambulatory Visit | Attending: Internal Medicine | Admitting: Internal Medicine

## 2023-04-01 DIAGNOSIS — R93421 Abnormal radiologic findings on diagnostic imaging of right kidney: Secondary | ICD-10-CM | POA: Diagnosis not present

## 2023-04-01 DIAGNOSIS — R1013 Epigastric pain: Secondary | ICD-10-CM | POA: Diagnosis not present

## 2023-04-01 DIAGNOSIS — R1011 Right upper quadrant pain: Secondary | ICD-10-CM | POA: Insufficient documentation

## 2023-04-01 DIAGNOSIS — K8689 Other specified diseases of pancreas: Secondary | ICD-10-CM | POA: Diagnosis not present

## 2023-04-03 ENCOUNTER — Telehealth: Payer: Self-pay | Admitting: Pharmacy Technician

## 2023-04-03 NOTE — Telephone Encounter (Signed)
Pharmacy Patient Advocate Encounter   Received notification from CoverMyMeds that prior authorization for RABEprazole Sodium 20MG  dr tablets is required/requested.   Insurance verification completed.   The patient is insured through [facility] .   Per test claim: [id number]

## 2023-04-03 NOTE — Telephone Encounter (Signed)
Clinical questions answered and prior authorization submitted

## 2023-04-04 ENCOUNTER — Telehealth: Payer: Self-pay | Admitting: Internal Medicine

## 2023-04-04 NOTE — Telephone Encounter (Signed)
Pt aware.

## 2023-04-04 NOTE — Telephone Encounter (Signed)
victoria from [facility] called stating the pt medication RABEprazole has been approved for one year

## 2023-04-05 ENCOUNTER — Encounter: Payer: Self-pay | Admitting: Internal Medicine

## 2023-04-05 ENCOUNTER — Other Ambulatory Visit (HOSPITAL_COMMUNITY): Payer: Self-pay

## 2023-04-05 NOTE — Telephone Encounter (Signed)
Pharmacy Patient Advocate Encounter  Received notification from [facility] that Prior Authorization for RABEprazole Sodium 20MG  dr tablets has been APPROVED from 04/04/23 to 04/02/24. Ran test claim, Copay is $--- (REFILL PAYABLE ON OR AFTER 04-13-23).  PA #/Case ID/Reference #: [id number]

## 2023-04-09 NOTE — Progress Notes (Unsigned)
11/21/2020 10:13 AM   Brandi Ramos 1946-07-13 [id number]  Referring provider: Dale Matagorda, MD [address] [place],  [place] [address]  Urological history: 1. High risk hematuria - Non-smoker - CTU 01/2018 Nonobstructive right nephrolithiasis includes a 3 mm upper pole calculus on image 73/5; an 8 mm in long axis lower pole calculus on image 63/5; and a 4 mm lower pole calculus on image 66/5.  Left-sided renal calculi include a 1-2 mm upper pole nonobstructive calculus and a 1-2 mm lower pole nonobstructive calculus. There is also faint calcification along the left renal capsule on image 60/5 medially.  Some cortical hypodensity in thinning in the left mid upper kidney adjacent to the spleen. Given the slightly scalloped contour I favor that this is probably from remote prior scarring, and a similar appearance was present on 11/16/2015 - Cysto 02/2018 with Dr. Apolinar Junes was negative -Contrast CT in 10/2018 Nonobstructing calculus in the lower pole of the right kidney measuring up to 7 mm on image 47/4. There are probable additional tiny renal calculi -Cystoscopy with Dr. Lonna Cobb in 11/2019 was NED  2. Nephrolithiasis - Contrast CT in 10/2018 noted there is a nonobstructing calculus in the lower pole of the right kidney measuring up to 7 mm on image 47/4. There are probable additional tiny renal calculi - Contrast CT in 10/2020 7 mm stone noted within the inferior pole of the right kidney. - KUB, 02/2021 - stable right renal calculi 8 mm  -KUB (04/2022) -2 small calcific densities overlying the right kidney suggesting right renal stones -Abdominal US (03/2023) -possible nonobstructing calculi are seen in both kidneys  3. OAB -contributing factors of age, vaginal atrophy, depression, anxiety  Chief Complaint  Patient presents with   Over Active Bladder    HPI: Brandi Ramos is a 77 y.o. female who presents today for a 12 month follow up.    Previous  records reviewed.   KUB stable non obstructing renal stones  She is experiencing 1-7 daytime urinations, with nocturia x 1-2 with a mild urge to urinate.  She is leaking 1-2 times a week.  She does not wear absorbent products.  She does not limit fluid intake.  She does not engage in toilet mapping.  Patient denies any modifying or aggravating factors.  Patient denies any recent UTI's, gross hematuria, dysuria or suprapubic/flank pain.  Patient denies any fevers, chills, nausea or vomiting.    UA yellow cloudy, specific gravity 1.015, pH 7.0, 1+ leuks, 6-10 WBCs, 0-10 epithelial cells, amorphous sediment present, mucus threads present and moderate bacteria.     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     Surgical History: Past Surgical History:  Procedure Laterality Date   ABDOMINAL HYSTERECTOMY  1984   COLONOSCOPY     COLONOSCOPY WITH PROPOFOL N/A 09/26/2018  Procedure: COLONOSCOPY WITH PROPOFOL;  Surgeon: Christena Deem, MD;  Location[facility] ENDOSCOPY;  Service: Endoscopy;  Laterality: N/A;   ESOPHAGOGASTRODUODENOSCOPY (EGD) WITH PROPOFOL N/A 10/31/2015   Procedure: ESOPHAGOGASTRODUODENOSCOPY (EGD) WITH PROPOFOL;  Surgeon: Christena Deem, MD;  Location: [facility] ENDOSCOPY;  Service: Endoscopy;  Laterality: N/A;   ESOPHAGOGASTRODUODENOSCOPY (EGD) WITH PROPOFOL N/A 09/26/2018   Procedure: ESOPHAGOGASTRODUODENOSCOPY (EGD) WITH PROPOFOL;  Surgeon: Christena Deem, MD;  Location: [facility]  ENDOSCOPY;  Service: Endoscopy;  Laterality: N/A;   LAPAROSCOPIC APPENDECTOMY N/A 10/29/2018   Procedure: APPENDECTOMY LAPAROSCOPIC;  Surgeon: Carolan Shiver, MD;  Location: [facility] ORS;  Service: General;  Laterality: N/A;   LAPAROSCOPIC BILATERAL SALPINGO OOPHERECTOMY Bilateral 10/29/2018   Procedure: LAPAROSCOPIC BILATERAL SALPINGO OOPHORECTOMY;  Surgeon: Ward, Elenora Fender, MD;  Location: [facility] ORS;  Service: Gynecology;  Laterality: Bilateral;    Home Medications:  Allergies as of 04/10/2023       Reactions   Amitiza [lubiprostone] Other (See Comments)   Sulfa Antibiotics         Medication List        Accurate as of April 10, 2023 10:13 AM. If you have any questions, ask your nurse or doctor.          DULoxetine 60 MG capsule Commonly known as: CYMBALTA Take 1 capsule (60 mg total) by mouth daily.   fluticasone 50 MCG/ACT nasal spray Commonly known as: FLONASE Place 2 sprays into both nostrils daily.   Integra 62.5-62.5-40-3 MG Caps Take 1 capsule by mouth daily.   Magnesium Oxide (Elemental) 400 MG Tabs Take one tablet per day   ondansetron 4 MG disintegrating tablet Commonly known as: Zofran ODT Take 1 tablet (4 mg total) by mouth 2 (two) times daily as needed for nausea or vomiting.   propranolol ER 60 MG 24 hr capsule Commonly known as: INDERAL LA TK 1 C PO D   RABEprazole 20 MG tablet Commonly known as: ACIPHEX Take 1 tablet (20 mg total) by mouth 2 (two) times daily before a meal.   rizatriptan 10 MG tablet Commonly known as: MAXALT Take 10 mg by mouth as needed. May repeat in 2 hours if needed   rosuvastatin 5 MG tablet Commonly known as: CRESTOR Take 1 tablet (5 mg total) by mouth daily.   sucralfate 1 g tablet Commonly known as: CARAFATE TAKE 1 TABLET(1 GRAM) BY MOUTH TWICE DAILY AS NEEDED   SUMAtriptan 100 MG tablet Commonly known as: IMITREX   topiramate 50 MG tablet Commonly known as: TOPAMAX TAKE 1 AND 1/2 TABLET BY MOUTH IN MORNING  AND 1 TABLET BY MOUTH IN EVENING        Allergies:  Allergies  Allergen Reactions   Amitiza [Lubiprostone] Other (See Comments)   Sulfa Antibiotics     Family History: Family History  Problem Relation Age of Onset   CAD Mother        s/p CABG   Hypercholesterolemia Mother    Breast cancer Mother 63   Heart disease Mother    Lung cancer Mother    Hypertension Father    Migraines Father    Hypercholesterolemia Father    Lung cancer Father    Migraines Sister    Arthritis Sister    Lymphoma Other        grandmother   Colon cancer Neg Hx     Social History:  reports that she has never smoked. She has never used smokeless tobacco. She reports current alcohol use. She reports that she does not use  drugs.   Physical Exam: BP 126/79   Pulse 63   Constitutional:  Well nourished. Alert and oriented, No acute distress. HEENT: [place] AT, moist mucus membranes.  Trachea midline Cardiovascular: No clubbing, cyanosis, or edema. Respiratory: Normal respiratory effort, no increased work of breathing. Neurologic: Grossly intact, no focal deficits, moving all 4 extremities. Psychiatric: Normal mood and affect.    Laboratory Data:    Latest Ref Rng & Units 03/27/2023    1:33 PM 10/31/2022    9:04 AM 06/27/2022    8:35 AM  BMP  Glucose 70 - 99 mg/dL 96  99  536   BUN 6 - 23 mg/dL 15  14  17    Creatinine 0.40 - 1.20 mg/dL 6.44  0.34  7.42   Sodium 135 - 145 mEq/L 140  142  143   Potassium 3.5 - 5.1 mEq/L 4.5  3.9  4.3   Chloride 96 - 112 mEq/L 105  107  108   CO2 19 - 32 mEq/L 29  29  31    Calcium 8.4 - 10.5 mg/dL 9.3  9.2  9.4        Latest Ref Rng & Units 03/27/2023    1:33 PM 10/31/2022    9:04 AM 06/27/2022    8:35 AM  CBC  WBC 4.0 - 10.5 K/uL 4.2  3.8  4.3   Hemoglobin 12.0 - 15.0 g/dL 59.5  63.8  75.6   Hematocrit 36.0 - 46.0 % 37.5  38.3  38.5   Platelets 150.0 - 400.0 K/uL 144.0  159.0  164.0    Urinalysis Results for orders placed or performed in visit on 04/10/23   Microscopic Examination   Urine  Result Value Ref Range   WBC, UA 6-10 (A) 0 - 5 /hpf   RBC, Urine None seen 0 - 2 /hpf   Epithelial Cells (non renal) 0-10 0 - 10 /hpf   Crystals Present (A) N/A   Crystal Type Amorphous Sediment N/A   Mucus, UA Present (A) Not Estab.   Bacteria, UA Moderate (A) None seen/Few  Urinalysis, Complete  Result Value Ref Range   Specific Gravity, UA 1.015 1.005 - 1.030   pH, UA 7.0 5.0 - 7.5   Color, UA Yellow Yellow   Appearance Ur Cloudy (A) Clear   Leukocytes,UA 1+ (A) Negative   Protein,UA Negative Negative/Trace   Glucose, UA Negative Negative   Ketones, UA Negative Negative   RBC, UA Negative Negative   Bilirubin, UA Negative Negative   Urobilinogen, Ur 0.2 0.2 - 1.0 mg/dL   Nitrite, UA Negative Negative   Microscopic Examination See below:   I have reviewed the labs.  Pertinent Imaging: CLINICAL DATA:  Follow-up nephrolithiasis.   EXAM: ABDOMEN - 1 VIEW   COMPARISON:  04/10/2022.   FINDINGS: The previously demonstrated upper and lower pole right renal calculi are slightly larger. Upper pole calculus currently measures 5 mm, previously 3 mm. The lower pole calculus currently measures 8 mm, previously 5 mm. A tiny additional lower pole right renal calculus is unchanged. No new urinary tract calculi seen. Normal bowel-gas pattern with mildly prominent stool throughout the colon. Unremarkable bones.   IMPRESSION: 1. Slight increase in size of 2 of the 3 right renal calculi. 2. No new urinary tract calculi.     Electronically Signed   By: Beckie Salts M.D.   On: 04/16/2023 17:46     I have independently reviewed the films.  See HPI.      Assessment &  Plan:    1. OAB -symptoms are mild  2. Right renal stone -stable -continue to monitor  3. High risk hematuria -work up in 2019 NED -no reports of gross heme -UA negative for micro heme  Return in about 2 years (around 04/09/2025) for KUB, UA and OAB .    Michiel Cowboy, PA-C  [facility] [address] [place], [place] [address] [phone]

## 2023-04-10 ENCOUNTER — Ambulatory Visit: Payer: Medicare Other | Admitting: Urology

## 2023-04-10 ENCOUNTER — Ambulatory Visit
Admission: RE | Admit: 2023-04-10 | Discharge: 2023-04-10 | Disposition: A | Discharge status: Home / Self Care | Payer: Medicare Other | Source: Ambulatory Visit | Attending: Urology | Admitting: Urology

## 2023-04-10 ENCOUNTER — Encounter: Payer: Self-pay | Admitting: Urology

## 2023-04-10 ENCOUNTER — Ambulatory Visit
Admission: RE | Admit: 2023-04-10 | Discharge: 2023-04-10 | Disposition: A | Discharge status: Home / Self Care | Payer: Medicare Other | Attending: Urology | Admitting: Urology

## 2023-04-10 VITALS — BP 126/79 | HR 63

## 2023-04-10 DIAGNOSIS — N3281 Overactive bladder: Secondary | ICD-10-CM | POA: Diagnosis not present

## 2023-04-10 DIAGNOSIS — N200 Calculus of kidney: Secondary | ICD-10-CM | POA: Diagnosis not present

## 2023-04-10 DIAGNOSIS — R319 Hematuria, unspecified: Secondary | ICD-10-CM

## 2023-04-10 LAB — URINALYSIS, COMPLETE
Bilirubin, UA: NEGATIVE
Glucose, UA: NEGATIVE
Ketones, UA: NEGATIVE
Nitrite, UA: NEGATIVE
Protein,UA: NEGATIVE
RBC, UA: NEGATIVE
Specific Gravity, UA: 1.015 (ref 1.005–1.030)
Urobilinogen, Ur: 0.2 mg/dL (ref 0.2–1.0)
pH, UA: 7 (ref 5.0–7.5)

## 2023-04-10 LAB — MICROSCOPIC EXAMINATION: RBC, Urine: NONE SEEN /hpf (ref 0–2)

## 2023-04-11 DIAGNOSIS — Q453 Other congenital malformations of pancreas and pancreatic duct: Secondary | ICD-10-CM | POA: Diagnosis not present

## 2023-04-11 DIAGNOSIS — K5909 Other constipation: Secondary | ICD-10-CM | POA: Diagnosis not present

## 2023-04-11 DIAGNOSIS — R1013 Epigastric pain: Secondary | ICD-10-CM | POA: Diagnosis not present

## 2023-04-11 DIAGNOSIS — K219 Gastro-esophageal reflux disease without esophagitis: Secondary | ICD-10-CM | POA: Diagnosis not present

## 2023-04-22 ENCOUNTER — Other Ambulatory Visit: Payer: Self-pay | Admitting: Gastroenterology

## 2023-04-22 DIAGNOSIS — R1013 Epigastric pain: Secondary | ICD-10-CM

## 2023-04-22 DIAGNOSIS — K862 Cyst of pancreas: Secondary | ICD-10-CM

## 2023-04-22 DIAGNOSIS — Q453 Other congenital malformations of pancreas and pancreatic duct: Secondary | ICD-10-CM

## 2023-04-26 ENCOUNTER — Ambulatory Visit
Admission: RE | Admit: 2023-04-26 | Discharge: 2023-04-26 | Disposition: A | Discharge status: Home / Self Care | Payer: Medicare Other | Source: Ambulatory Visit | Attending: Gastroenterology | Admitting: Gastroenterology

## 2023-04-26 DIAGNOSIS — R1013 Epigastric pain: Secondary | ICD-10-CM

## 2023-04-26 DIAGNOSIS — K862 Cyst of pancreas: Secondary | ICD-10-CM | POA: Insufficient documentation

## 2023-04-26 DIAGNOSIS — Q453 Other congenital malformations of pancreas and pancreatic duct: Secondary | ICD-10-CM | POA: Insufficient documentation

## 2023-04-26 DIAGNOSIS — I700 Atherosclerosis of aorta: Secondary | ICD-10-CM | POA: Diagnosis not present

## 2023-04-26 MED ORDER — GADOBUTROL 1 MMOL/ML IV SOLN
5.0000 mL | Freq: Once | INTRAVENOUS | Status: AC | PRN
  Administered 2023-04-26: 5 mL via INTRAVENOUS

## 2023-05-09 DIAGNOSIS — M5412 Radiculopathy, cervical region: Secondary | ICD-10-CM | POA: Diagnosis not present

## 2023-05-15 DIAGNOSIS — M5412 Radiculopathy, cervical region: Secondary | ICD-10-CM | POA: Diagnosis not present

## 2023-05-24 DIAGNOSIS — F411 Generalized anxiety disorder: Secondary | ICD-10-CM | POA: Diagnosis not present

## 2023-05-24 DIAGNOSIS — F410 Panic disorder [episodic paroxysmal anxiety] without agoraphobia: Secondary | ICD-10-CM | POA: Diagnosis not present

## 2023-05-24 DIAGNOSIS — F3341 Major depressive disorder, recurrent, in partial remission: Secondary | ICD-10-CM | POA: Diagnosis not present

## 2023-05-27 ENCOUNTER — Other Ambulatory Visit (INDEPENDENT_AMBULATORY_CARE_PROVIDER_SITE_OTHER): Payer: Medicare Other

## 2023-05-27 DIAGNOSIS — D696 Thrombocytopenia, unspecified: Secondary | ICD-10-CM | POA: Diagnosis not present

## 2023-05-27 LAB — CBC WITH DIFFERENTIAL/PLATELET
Basophils Absolute: 0 10*3/uL (ref 0.0–0.1)
Basophils Relative: 0.5 % (ref 0.0–3.0)
Eosinophils Absolute: 0.1 10*3/uL (ref 0.0–0.7)
Eosinophils Relative: 1.8 % (ref 0.0–5.0)
HCT: 37 % (ref 36.0–46.0)
Hemoglobin: 11.9 g/dL — ABNORMAL LOW (ref 12.0–15.0)
Lymphocytes Relative: 33.5 % (ref 12.0–46.0)
Lymphs Abs: 1.5 10*3/uL (ref 0.7–4.0)
MCHC: 32.3 g/dL (ref 30.0–36.0)
MCV: 89.3 fl (ref 78.0–100.0)
Monocytes Absolute: 0.4 10*3/uL (ref 0.1–1.0)
Monocytes Relative: 8.7 % (ref 3.0–12.0)
Neutro Abs: 2.4 10*3/uL (ref 1.4–7.7)
Neutrophils Relative %: 55.5 % (ref 43.0–77.0)
Platelets: 136 10*3/uL — ABNORMAL LOW (ref 150.0–400.0)
RBC: 4.14 Mil/uL (ref 3.87–5.11)
RDW: 14.4 % (ref 11.5–15.5)
WBC: 4.3 10*3/uL (ref 4.0–10.5)

## 2023-05-28 ENCOUNTER — Other Ambulatory Visit: Payer: Self-pay | Admitting: Internal Medicine

## 2023-05-28 DIAGNOSIS — D649 Anemia, unspecified: Secondary | ICD-10-CM

## 2023-05-28 NOTE — Progress Notes (Signed)
Orders placed for add on labs.

## 2023-05-29 DIAGNOSIS — K080 Exfoliation of teeth due to systemic causes: Secondary | ICD-10-CM | POA: Diagnosis not present

## 2023-05-30 DIAGNOSIS — G4486 Cervicogenic headache: Secondary | ICD-10-CM | POA: Diagnosis not present

## 2023-05-30 DIAGNOSIS — G43719 Chronic migraine without aura, intractable, without status migrainosus: Secondary | ICD-10-CM | POA: Diagnosis not present

## 2023-05-30 DIAGNOSIS — G4701 Insomnia due to medical condition: Secondary | ICD-10-CM | POA: Diagnosis not present

## 2023-05-30 DIAGNOSIS — M47812 Spondylosis without myelopathy or radiculopathy, cervical region: Secondary | ICD-10-CM | POA: Diagnosis not present

## 2023-06-06 DIAGNOSIS — M542 Cervicalgia: Secondary | ICD-10-CM | POA: Diagnosis not present

## 2023-06-06 DIAGNOSIS — M5412 Radiculopathy, cervical region: Secondary | ICD-10-CM | POA: Diagnosis not present

## 2023-06-06 DIAGNOSIS — M47812 Spondylosis without myelopathy or radiculopathy, cervical region: Secondary | ICD-10-CM | POA: Diagnosis not present

## 2023-06-26 ENCOUNTER — Ambulatory Visit: Payer: Medicare Other | Admitting: Physician Assistant

## 2023-06-26 ENCOUNTER — Encounter: Payer: Self-pay | Admitting: Physician Assistant

## 2023-06-26 VITALS — BP 123/72 | HR 74

## 2023-06-26 DIAGNOSIS — N3281 Overactive bladder: Secondary | ICD-10-CM | POA: Diagnosis not present

## 2023-06-26 DIAGNOSIS — N3001 Acute cystitis with hematuria: Secondary | ICD-10-CM

## 2023-06-26 DIAGNOSIS — M545 Low back pain, unspecified: Secondary | ICD-10-CM

## 2023-06-26 DIAGNOSIS — R82998 Other abnormal findings in urine: Secondary | ICD-10-CM | POA: Diagnosis not present

## 2023-06-26 DIAGNOSIS — R300 Dysuria: Secondary | ICD-10-CM | POA: Diagnosis not present

## 2023-06-26 LAB — URINALYSIS, COMPLETE
Bilirubin, UA: NEGATIVE
Glucose, UA: NEGATIVE
Ketones, UA: NEGATIVE
Nitrite, UA: NEGATIVE
Specific Gravity, UA: 1.015 (ref 1.005–1.030)
Urobilinogen, Ur: 0.2 mg/dL (ref 0.2–1.0)
pH, UA: 6.5 (ref 5.0–7.5)

## 2023-06-26 LAB — MICROSCOPIC EXAMINATION: WBC, UA: >30 /[HPF] — AB (ref 0–5)

## 2023-06-26 MED ORDER — CEFUROXIME AXETIL 250 MG PO TABS
250.0000 mg | ORAL_TABLET | Freq: Two times a day (BID) | ORAL | 0 refills | Status: DC
Start: 2023-06-26 — End: 2023-07-01

## 2023-06-26 NOTE — Progress Notes (Signed)
06/26/2023 11:42 AM   Brandi Ramos 1946-01-01 [id number]  CC: Chief Complaint  Patient presents with   Follow-up   HPI: Brandi Ramos is a 77 y.o. female with PMH nephrolithiasis, OAB, and hematuria with benign workup in 2020/2021 who presents today for evaluation of possible UTI.   Today she reports 2 weeks of dysuria and low back pain.  She denies fever, nausea, or vomiting.  She was taking Azo, which was helping.  In-office UA today positive for 2+ blood, 1+ protein, and 2+ leukocytes; urine microscopy with >30 WBCs/HPF, 11-30 RBCs/HPF, and moderate bacteria.  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     Surgical History: Past Surgical History:  Procedure Laterality Date   ABDOMINAL HYSTERECTOMY  1984   COLONOSCOPY     COLONOSCOPY WITH PROPOFOL N/A 09/26/2018   Procedure: COLONOSCOPY WITH PROPOFOL;  Surgeon: Christena Deem, MD;  Location: [facility] ENDOSCOPY;  Service: Endoscopy;  Laterality: N/A;   ESOPHAGOGASTRODUODENOSCOPY (EGD) WITH PROPOFOL N/A 10/31/2015   Procedure: ESOPHAGOGASTRODUODENOSCOPY (EGD) WITH PROPOFOL;  Surgeon: Christena Deem, MD;  Location: [facility] ENDOSCOPY;  Service: Endoscopy;  Laterality: N/A;   ESOPHAGOGASTRODUODENOSCOPY (EGD) WITH PROPOFOL N/A 09/26/2018   Procedure: ESOPHAGOGASTRODUODENOSCOPY (EGD) WITH PROPOFOL;  Surgeon: Christena Deem, MD;  Location: [facility] ENDOSCOPY;  Service: Endoscopy;  Laterality: N/A;   LAPAROSCOPIC APPENDECTOMY N/A  10/29/2018   Procedure: APPENDECTOMY LAPAROSCOPIC;  Surgeon: Carolan Shiver, MD;  Location: [facility] ORS;  Service: General;  Laterality: N/A;   LAPAROSCOPIC BILATERAL SALPINGO OOPHERECTOMY Bilateral 10/29/2018   Procedure: LAPAROSCOPIC BILATERAL SALPINGO OOPHORECTOMY;  Surgeon: Ward, Elenora Fender, MD;  Location: [facility] ORS;  Service: Gynecology;  Laterality: Bilateral;    Home Medications:  Allergies as of 06/26/2023       Reactions   Amitiza [lubiprostone] Other (See Comments)   Sulfa Antibiotics         Medication List        Accurate as of June 26, 2023 11:42 AM. If you have any questions, ask your nurse or doctor.          cefUROXime 250 MG tablet Commonly known as: CEFTIN Take 1 tablet (250 mg total) by mouth 2 (two) times daily with a meal for 5 days. Started by: Carman Ching   DULoxetine 60 MG capsule Commonly known as: CYMBALTA Take 1 capsule (60 mg total) by mouth daily.   fluticasone 50 MCG/ACT nasal spray Commonly known as: FLONASE Place 2 sprays into both nostrils daily.   Integra 62.5-62.5-40-3 MG Caps Take 1 capsule by mouth daily.   Magnesium Oxide (Elemental) 400 MG Tabs Take one tablet per day   ondansetron 4 MG disintegrating tablet Commonly known as: Zofran ODT Take 1 tablet (4 mg total) by mouth 2 (two) times daily as needed for nausea or vomiting.   propranolol ER 60 MG 24 hr  capsule Commonly known as: INDERAL LA TK 1 C PO D   RABEprazole 20 MG tablet Commonly known as: ACIPHEX Take 1 tablet (20 mg total) by mouth 2 (two) times daily before a meal.   rizatriptan 10 MG tablet Commonly known as: MAXALT Take 10 mg by mouth as needed. May repeat in 2 hours if needed   rosuvastatin 5 MG tablet Commonly known as: CRESTOR Take 1 tablet (5 mg total) by mouth daily.   sucralfate 1 g tablet Commonly known as: CARAFATE TAKE 1 TABLET(1 GRAM) BY MOUTH TWICE DAILY AS NEEDED   SUMAtriptan 100 MG tablet Commonly known as: IMITREX    topiramate 50 MG tablet Commonly known as: TOPAMAX TAKE 1 AND 1/2 TABLET BY MOUTH IN MORNING AND 1 TABLET BY MOUTH IN EVENING        Allergies:  Allergies  Allergen Reactions   Amitiza [Lubiprostone] Other (See Comments)   Sulfa Antibiotics     Family History: Family History  Problem Relation Age of Onset   CAD Mother        s/p CABG   Hypercholesterolemia Mother    Breast cancer Mother 75   Heart disease Mother    Lung cancer Mother    Hypertension Father    Migraines Father    Hypercholesterolemia Father    Lung cancer Father    Migraines Sister    Arthritis Sister    Lymphoma Other        grandmother   Colon cancer Neg Hx     Social History:   reports that she has never smoked. She has never used smokeless tobacco. She reports current alcohol use. She reports that she does not use drugs.  Physical Exam: BP 123/72   Pulse 74   Constitutional:  Alert and oriented, no acute distress, nontoxic appearing HEENT: [place], AT Cardiovascular: No clubbing, cyanosis, or edema Respiratory: Normal respiratory effort, no increased work of breathing Skin: No rashes, bruises or suspicious lesions Neurologic: Grossly intact, no focal deficits, moving all 4 extremities Psychiatric: Normal mood and affect  Laboratory Data: Results for orders placed or performed in visit on 06/26/23  Microscopic Examination   Urine  Result Value Ref Range   WBC, UA >30 (A) 0 - 5 /hpf   RBC, Urine 11-30 (A) 0 - 2 /hpf   Epithelial Cells (non renal) 0-10 0 - 10 /hpf   Mucus, UA Present (A) Not Estab.   Bacteria, UA Moderate (A) None seen/Few  Urinalysis, Complete  Result Value Ref Range   Specific Gravity, UA 1.015 1.005 - 1.030   pH, UA 6.5 5.0 - 7.5   Color, UA Yellow Yellow   Appearance Ur Cloudy (A) Clear   Leukocytes,UA 2+ (A) Negative   Protein,UA 1+ (A) Negative/Trace   Glucose, UA Negative Negative   Ketones, UA Negative Negative   RBC, UA 2+ (A) Negative   Bilirubin, UA  Negative Negative   Urobilinogen, Ur 0.2 0.2 - 1.0 mg/dL   Nitrite, UA Negative Negative   Microscopic Examination See below:    Assessment & Plan:   1. Acute cystitis with hematuria UA appears grossly infected today, will start empiric cefuroxime and send for culture for further evaluation.  Will plan for lab visit in about a week for UA to prove resolution of microscopic hematuria.  We discussed return precautions including fevers or symptomatic worsening despite antibiotics. - Urinalysis, Complete - CULTURE, URINE COMPREHENSIVE - cefUROXime (CEFTIN) 250 MG tablet; Take 1 tablet (250 mg total) by  mouth 2 (two) times daily with a meal for 5 days.  Dispense: 10 tablet; Refill: 0  Return in about 1 week (around 07/03/2023) for Lab visit for UA.  Carman Ching, PA-C  [facility] [place] [address], [address] [place], [place] [address] [phone]

## 2023-06-27 ENCOUNTER — Other Ambulatory Visit: Payer: Self-pay | Admitting: *Deleted

## 2023-06-27 DIAGNOSIS — N3001 Acute cystitis with hematuria: Secondary | ICD-10-CM

## 2023-06-29 LAB — CULTURE, URINE COMPREHENSIVE

## 2023-07-01 ENCOUNTER — Encounter: Payer: Medicare Other | Admitting: Internal Medicine

## 2023-07-01 ENCOUNTER — Other Ambulatory Visit: Payer: Self-pay

## 2023-07-01 ENCOUNTER — Ambulatory Visit: Payer: Medicare Other | Admitting: Internal Medicine

## 2023-07-01 ENCOUNTER — Encounter: Payer: Self-pay | Admitting: Internal Medicine

## 2023-07-01 VITALS — BP 116/68 | HR 64 | Temp 98.2°F | Resp 16 | Ht 64.0 in | Wt 115.0 lb

## 2023-07-01 DIAGNOSIS — D696 Thrombocytopenia, unspecified: Secondary | ICD-10-CM

## 2023-07-01 DIAGNOSIS — R319 Hematuria, unspecified: Secondary | ICD-10-CM

## 2023-07-01 DIAGNOSIS — D61818 Other pancytopenia: Secondary | ICD-10-CM

## 2023-07-01 DIAGNOSIS — Z0000 Encounter for general adult medical examination without abnormal findings: Secondary | ICD-10-CM

## 2023-07-01 DIAGNOSIS — E7800 Pure hypercholesterolemia, unspecified: Secondary | ICD-10-CM | POA: Diagnosis not present

## 2023-07-01 DIAGNOSIS — G43709 Chronic migraine without aura, not intractable, without status migrainosus: Secondary | ICD-10-CM

## 2023-07-01 DIAGNOSIS — I700 Atherosclerosis of aorta: Secondary | ICD-10-CM

## 2023-07-01 DIAGNOSIS — F419 Anxiety disorder, unspecified: Secondary | ICD-10-CM

## 2023-07-01 DIAGNOSIS — D72819 Decreased white blood cell count, unspecified: Secondary | ICD-10-CM

## 2023-07-01 DIAGNOSIS — Z1231 Encounter for screening mammogram for malignant neoplasm of breast: Secondary | ICD-10-CM

## 2023-07-01 DIAGNOSIS — M542 Cervicalgia: Secondary | ICD-10-CM

## 2023-07-01 DIAGNOSIS — K219 Gastro-esophageal reflux disease without esophagitis: Secondary | ICD-10-CM | POA: Diagnosis not present

## 2023-07-01 DIAGNOSIS — F320 Major depressive disorder, single episode, mild: Secondary | ICD-10-CM

## 2023-07-01 DIAGNOSIS — N3001 Acute cystitis with hematuria: Secondary | ICD-10-CM

## 2023-07-01 LAB — HEPATIC FUNCTION PANEL
ALT: 14 U/L (ref 0–35)
AST: 25 U/L (ref 0–37)
Albumin: 4 g/dL (ref 3.5–5.2)
Alkaline Phosphatase: 47 U/L (ref 39–117)
Bilirubin, Direct: 0.1 mg/dL (ref 0.0–0.3)
Total Bilirubin: 0.6 mg/dL (ref 0.2–1.2)
Total Protein: 6.5 g/dL (ref 6.0–8.3)

## 2023-07-01 LAB — CBC WITH DIFFERENTIAL/PLATELET
Basophils Absolute: 0 10*3/uL (ref 0.0–0.1)
Basophils Relative: 0.7 % (ref 0.0–3.0)
Eosinophils Absolute: 0.1 10*3/uL (ref 0.0–0.7)
Eosinophils Relative: 3.1 % (ref 0.0–5.0)
HCT: 36.9 % (ref 36.0–46.0)
Hemoglobin: 11.8 g/dL — ABNORMAL LOW (ref 12.0–15.0)
Lymphocytes Relative: 39.5 % (ref 12.0–46.0)
Lymphs Abs: 1.4 10*3/uL (ref 0.7–4.0)
MCHC: 32 g/dL (ref 30.0–36.0)
MCV: 90.7 fL (ref 78.0–100.0)
Monocytes Absolute: 0.3 10*3/uL (ref 0.1–1.0)
Monocytes Relative: 10 % (ref 3.0–12.0)
Neutro Abs: 1.6 10*3/uL (ref 1.4–7.7)
Neutrophils Relative %: 46.7 % (ref 43.0–77.0)
Platelets: 153 10*3/uL (ref 150.0–400.0)
RBC: 4.07 Mil/uL (ref 3.87–5.11)
RDW: 14.1 % (ref 11.5–15.5)
WBC: 3.5 10*3/uL — ABNORMAL LOW (ref 4.0–10.5)

## 2023-07-01 LAB — LIPID PANEL
Cholesterol: 133 mg/dL (ref 0–200)
HDL: 54.9 mg/dL (ref >39.00)
LDL Cholesterol: 62 mg/dL (ref 0–99)
NonHDL: 77.9
Total CHOL/HDL Ratio: 2
Triglycerides: 78 mg/dL (ref 0.0–149.0)
VLDL: 15.6 mg/dL (ref 0.0–40.0)

## 2023-07-01 LAB — IBC + FERRITIN
Ferritin: 92.9 ng/mL (ref 10.0–291.0)
Iron: 139 ug/dL (ref 42–145)
Saturation Ratios: 48.7 % (ref 20.0–50.0)
TIBC: 285.6 ug/dL (ref 250.0–450.0)
Transferrin: 204 mg/dL — ABNORMAL LOW (ref 212.0–360.0)

## 2023-07-01 LAB — BASIC METABOLIC PANEL
BUN: 14 mg/dL (ref 6–23)
CO2: 30 meq/L (ref 19–32)
Calcium: 9.1 mg/dL (ref 8.4–10.5)
Chloride: 105 meq/L (ref 96–112)
Creatinine, Ser: 0.92 mg/dL (ref 0.40–1.20)
GFR: 61.13 mL/min (ref >60.00)
Glucose, Bld: 97 mg/dL (ref 70–99)
Potassium: 3.8 meq/L (ref 3.5–5.1)
Sodium: 143 meq/L (ref 135–145)

## 2023-07-01 LAB — TSH: TSH: 2.15 u[IU]/mL (ref 0.35–5.50)

## 2023-07-01 MED ORDER — NITROFURANTOIN MONOHYD MACRO 100 MG PO CAPS: 100.0000 mg | ORAL_CAPSULE | Freq: Two times a day (BID) | ORAL | 0 refills | Status: AC

## 2023-07-01 NOTE — Assessment & Plan Note (Signed)
Continue crestor 

## 2023-07-01 NOTE — Assessment & Plan Note (Signed)
Have discussed with hematology.  Follow cbc.

## 2023-07-01 NOTE — Assessment & Plan Note (Signed)
Has had persistent decreased white blood cell count/pancytopenia.  Discussed with hematology.  They recommended continuing to follow. 

## 2023-07-01 NOTE — Assessment & Plan Note (Signed)
Continue trazodone, cymbalta.  Continue to follow up with psychiatry. Stable.  Follow.

## 2023-07-01 NOTE — Assessment & Plan Note (Signed)
Being followed by ortho for neck pain.  S/p C7-T1 ESI - last 05/15/23.  Did help some. Still with neck pain.  Some days worse than others.  Feel aggravates her headaches.  Continues f/u with ortho

## 2023-07-01 NOTE — Assessment & Plan Note (Signed)
On Aciphex. Off carafate. Controlled. Follow.

## 2023-07-01 NOTE — Progress Notes (Signed)
Subjective:    Patient ID: Brandi Ramos, female    DOB: 03/01/1946, 77 y.o.   MRN: [id number]  Patient here for  Chief Complaint  Patient presents with   Annual Exam    HPI Here for a physical exam. Evaluated by urology 06/26/23 - treated for UTI with ceftin. Being followed by ortho for neck pain.  S/p C7-T1 ESI - last 05/15/23.  Did help some. Still with neck pain.  Some days worse than others.  Feel aggravates her headaches.  Continues f/u with NSU. Also being followed by headache clinic. Flares with neck, but overall headaches are relatively stable. Seeing GI.  Had MR abdomen 05/02/23 - no MRI findings to explain epigastric pain.  No imaging abnormality of the pancreas.  Continues on aciphex.  Off carafate. Pain is better.  Bowels are moving  q 2-3 days.  Has to take something to help them move. Trying to stay active.  No chest pain or sob.  No cough.  On cymbalta.  Taking xanax - takes 1/2 tablet before bed.  Discussed continued xanax.  Followed by psychiatry.     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    Past Surgical History:  Procedure Laterality Date   ABDOMINAL HYSTERECTOMY  1984   COLONOSCOPY     COLONOSCOPY WITH PROPOFOL N/A 09/26/2018   Procedure: COLONOSCOPY WITH PROPOFOL;  Surgeon: Christena Deem, MD;  Location: [facility] ENDOSCOPY;  Service: Endoscopy;  Laterality: N/A;   ESOPHAGOGASTRODUODENOSCOPY (EGD) WITH PROPOFOL N/A 10/31/2015   Procedure: ESOPHAGOGASTRODUODENOSCOPY (EGD)  WITH PROPOFOL;  Surgeon: Christena Deem, MD;  Location: [facility] ENDOSCOPY;  Service: Endoscopy;  Laterality: N/A;   ESOPHAGOGASTRODUODENOSCOPY (EGD) WITH PROPOFOL N/A 09/26/2018   Procedure: ESOPHAGOGASTRODUODENOSCOPY (EGD) WITH PROPOFOL;  Surgeon: Christena Deem, MD;  Location: [facility] ENDOSCOPY;  Service: Endoscopy;  Laterality: N/A;   LAPAROSCOPIC APPENDECTOMY N/A 10/29/2018   Procedure: APPENDECTOMY LAPAROSCOPIC;  Surgeon: Carolan Shiver, MD;  Location: [facility] ORS;  Service: General;  Laterality: N/A;   LAPAROSCOPIC BILATERAL SALPINGO OOPHERECTOMY Bilateral 10/29/2018   Procedure: LAPAROSCOPIC BILATERAL SALPINGO OOPHORECTOMY;  Surgeon: Ward, Elenora Fender, MD;  Location: [facility] ORS;  Service: Gynecology;  Laterality: Bilateral;   Family History  Problem Relation Age of Onset   CAD Mother        s/p CABG   Hypercholesterolemia Mother    Breast cancer Mother 51   Heart disease Mother    Lung cancer Mother    Hypertension Father    Migraines Father    Hypercholesterolemia Father    Lung cancer Father    Migraines Sister    Arthritis Sister    Lymphoma Other        grandmother   Colon cancer Neg Hx    Social History   Socioeconomic History   Marital status: Widowed    Spouse name: Not  on file   Number of children: 2   Years of education: Not on file   Highest education level: 12th grade  Occupational History   Not on file  Tobacco Use   Smoking status: Never   Smokeless tobacco: Never  Vaping Use   Vaping status: Never Used  Substance and Sexual Activity   Alcohol use: Yes    Alcohol/week: 0.0 standard drinks of alcohol    Comment: wine occassionally   Drug use: No   Sexual activity: Not Currently  Other Topics Concern   Not on file  Social History Narrative   Not on file   Social Determinants of Health   Financial Resource Strain: Low Risk  (12/20/2022)   Overall Financial Resource Strain (CARDIA)    Difficulty of Paying Living Expenses: Not hard at all  Food  Insecurity: No Food Insecurity (06/28/2023)   Hunger Vital Sign    Worried About Running Out of Food in the Last Year: Never true    Ran Out of Food in the Last Year: Never true  Transportation Needs: No Transportation Needs (06/28/2023)   PRAPARE - Administrator, Civil Service (Medical): No    Lack of Transportation (Non-Medical): No  Physical Activity: Insufficiently Active (06/28/2023)   Exercise Vital Sign    Days of Exercise per Week: 3 days    Minutes of Exercise per Session: 40 min  Stress: No Stress Concern Present (12/20/2022)   [employer] of Occupational Health - Occupational Stress Questionnaire    Feeling of Stress : Not at all  Social Connections: Moderately Integrated (06/28/2023)   Social Connection and Isolation Panel [NHANES]    Frequency of Communication with Friends and Family: Three times a week    Frequency of Social Gatherings with Friends and Family: More than three times a week    Attends Religious Services: More than 4 times per year    Active Member of Clubs or Organizations: Yes    Attends Banker Meetings: More than 4 times per year    Marital Status: Widowed     Review of Systems  Constitutional:  Negative for appetite change and unexpected weight change.  HENT:  Negative for congestion and sinus pressure.   Respiratory:  Negative for cough, chest tightness and shortness of breath.   Cardiovascular:  Negative for chest pain, palpitations and leg swelling.  Gastrointestinal:  Negative for abdominal pain, diarrhea, nausea and vomiting.  Genitourinary:  Negative for difficulty urinating and dysuria.  Musculoskeletal:  Positive for neck pain. Negative for joint swelling and myalgias.  Skin:  Negative for color change and rash.  Neurological:  Negative for dizziness.       Headaches as outlined.   Psychiatric/Behavioral:  Negative for agitation and dysphoric mood.        Objective:     BP 116/68   Pulse 64   Temp  98.2 F (36.8 C)   Resp 16   Ht 5\' 4"  (1.626 m)   Wt 115 lb (52.2 kg)   SpO2 98%   BMI 19.74 kg/m  Wt Readings from Last 3 Encounters:  07/01/23 115 lb (52.2 kg)  03/27/23 116 lb 9.6 oz (52.9 kg)  12/20/22 119 lb (54 kg)    Physical Exam Vitals reviewed.  Constitutional:      General: She is not in acute distress.    Appearance: Normal appearance. She is well-developed.  HENT:     Head: Normocephalic and atraumatic.     Right  Ear: External ear normal.     Left Ear: External ear normal.  Eyes:     General: No scleral icterus.       Right eye: No discharge.        Left eye: No discharge.     Conjunctiva/sclera: Conjunctivae normal.  Neck:     Thyroid: No thyromegaly.  Cardiovascular:     Rate and Rhythm: Normal rate and regular rhythm.  Pulmonary:     Effort: No tachypnea, accessory muscle usage or respiratory distress.     Breath sounds: Normal breath sounds. No decreased breath sounds or wheezing.  Chest:  Breasts:    Right: No inverted nipple, mass, nipple discharge or tenderness (no axillary adenopathy).     Left: No inverted nipple, mass, nipple discharge or tenderness (no axilarry adenopathy).  Abdominal:     General: Bowel sounds are normal.     Palpations: Abdomen is soft.     Tenderness: There is no abdominal tenderness.  Musculoskeletal:        General: No swelling or tenderness.     Cervical back: Neck supple. No tenderness.  Lymphadenopathy:     Cervical: No cervical adenopathy.  Skin:    Findings: No erythema or rash.  Neurological:     Mental Status: She is alert and oriented to person, place, and time.  Psychiatric:        Mood and Affect: Mood normal.        Behavior: Behavior normal.      Outpatient Encounter Medications as of 07/01/2023  Medication Sig   DULoxetine (CYMBALTA) 60 MG capsule Take 1 capsule (60 mg total) by mouth daily.   Fe Fum-FePoly-Vit C-Vit B3 (INTEGRA) 62.5-62.5-40-3 MG CAPS Take 1 capsule by mouth daily.   fluticasone  (FLONASE) 50 MCG/ACT nasal spray Place 2 sprays into both nostrils daily.   Magnesium Oxide, Elemental, 400 MG TABS Take one tablet per day   ondansetron (ZOFRAN ODT) 4 MG disintegrating tablet Take 1 tablet (4 mg total) by mouth 2 (two) times daily as needed for nausea or vomiting.   propranolol ER (INDERAL LA) 60 MG 24 hr capsule TK 1 C PO D   RABEprazole (ACIPHEX) 20 MG tablet Take 1 tablet (20 mg total) by mouth 2 (two) times daily before a meal.   rizatriptan (MAXALT) 10 MG tablet Take 10 mg by mouth as needed. May repeat in 2 hours if needed   rosuvastatin (CRESTOR) 5 MG tablet Take 1 tablet (5 mg total) by mouth daily.   sucralfate (CARAFATE) 1 g tablet TAKE 1 TABLET(1 GRAM) BY MOUTH TWICE DAILY AS NEEDED   SUMAtriptan (IMITREX) 100 MG tablet    topiramate (TOPAMAX) 50 MG tablet TAKE 1 AND 1/2 TABLET BY MOUTH IN MORNING AND 1 TABLET BY MOUTH IN EVENING   [DISCONTINUED] cefUROXime (CEFTIN) 250 MG tablet Take 1 tablet (250 mg total) by mouth 2 (two) times daily with a meal for 5 days.   No facility-administered encounter medications on file as of 07/01/2023.     Lab Results  Component Value Date   WBC 3.5 (L) 07/01/2023   HGB 11.8 (L) 07/01/2023   HCT 36.9 07/01/2023   PLT 153.0 07/01/2023   GLUCOSE 97 07/01/2023   CHOL 133 07/01/2023   TRIG 78.0 07/01/2023   HDL 54.90 07/01/2023   LDLCALC 62 07/01/2023   ALT 14 07/01/2023   AST 25 07/01/2023   NA 143 07/01/2023   K 3.8 07/01/2023   CL 105 07/01/2023  CREATININE 0.92 07/01/2023   BUN 14 07/01/2023   CO2 30 07/01/2023   TSH 2.15 07/01/2023   HGBA1C 5.5 01/15/2018    MR ABDOMEN MRCP W WO CONTAST  Result Date: 05/02/2023 CLINICAL DATA:  Pancreatic cysts, epigastric pain EXAM: MRI ABDOMEN WITHOUT AND WITH CONTRAST (INCLUDING MRCP) TECHNIQUE: Multiplanar multisequence MR imaging of the abdomen was performed both before and after the administration of intravenous contrast. Heavily T2-weighted images of the biliary and  pancreatic ducts were obtained, and three-dimensional MRCP images were rendered by post processing. CONTRAST:  5mL GADAVIST GADOBUTROL 1 MMOL/ML IV SOLN COMPARISON:  None Available. FINDINGS: Lower chest: No acute abnormality. Hepatobiliary: No solid liver abnormality is seen. No gallstones, gallbladder wall thickening, or biliary dilatation. Pancreas: Unremarkable. No pancreatic ductal dilatation or surrounding inflammatory changes. Spleen: Normal in size without significant abnormality. Adrenals/Urinary Tract: Adrenal glands are unremarkable. Kidneys are normal, without obvious renal calculi, solid lesion, or hydronephrosis. Stomach/Bowel: Stomach is within normal limits. No evidence of bowel wall thickening, distention, or inflammatory changes. Vascular/Lymphatic: Aortic atherosclerosis. No enlarged abdominal lymph nodes. Other: No abdominal wall hernia or abnormality. No ascites. Musculoskeletal: No acute or significant osseous findings. IMPRESSION: 1. No MR findings of the abdomen to explain epigastric pain. 2. No imaging abnormality of the pancreas on today's examination. Aortic Atherosclerosis (ICD10-I70.0). Electronically Signed   By: Jearld Lesch M.D.   On: 05/02/2023 15:03   MR 3D Recon At Scanner  Result Date: 05/02/2023 CLINICAL DATA:  Pancreatic cysts, epigastric pain EXAM: MRI ABDOMEN WITHOUT AND WITH CONTRAST (INCLUDING MRCP) TECHNIQUE: Multiplanar multisequence MR imaging of the abdomen was performed both before and after the administration of intravenous contrast. Heavily T2-weighted images of the biliary and pancreatic ducts were obtained, and three-dimensional MRCP images were rendered by post processing. CONTRAST:  5mL GADAVIST GADOBUTROL 1 MMOL/ML IV SOLN COMPARISON:  None Available. FINDINGS: Lower chest: No acute abnormality. Hepatobiliary: No solid liver abnormality is seen. No gallstones, gallbladder wall thickening, or biliary dilatation. Pancreas: Unremarkable. No pancreatic ductal  dilatation or surrounding inflammatory changes. Spleen: Normal in size without significant abnormality. Adrenals/Urinary Tract: Adrenal glands are unremarkable. Kidneys are normal, without obvious renal calculi, solid lesion, or hydronephrosis. Stomach/Bowel: Stomach is within normal limits. No evidence of bowel wall thickening, distention, or inflammatory changes. Vascular/Lymphatic: Aortic atherosclerosis. No enlarged abdominal lymph nodes. Other: No abdominal wall hernia or abnormality. No ascites. Musculoskeletal: No acute or significant osseous findings. IMPRESSION: 1. No MR findings of the abdomen to explain epigastric pain. 2. No imaging abnormality of the pancreas on today's examination. Aortic Atherosclerosis (ICD10-I70.0). Electronically Signed   By: Jearld Lesch M.D.   On: 05/02/2023 15:03       Assessment & Plan:  Routine general medical examination at a health care facility  Health care maintenance Assessment & Plan: Physical today 07/01/23.  Mammogram 07/10/22 - Birads I.  Schedule f/u mammogram.  Colonoscopy 09/26/18 - recommended f/u colonoscopy in 5 years.     Hematuria, unspecified type Assessment & Plan: Being followed by urology as outlined.  Evaluated 04/10/23. Being followed for kidney stone.  Work up in 2019 NED -no reports of gross heme -UA negative for micro heme Return in about 2 years (around 04/09/2025) for KUB, UA and OAB   Hypercholesterolemia Assessment & Plan: Continue Crestor.  Low-cholesterol diet and exercise.  Follow lipid panel liver function test.  Orders: -     Basic metabolic panel -     Hepatic function panel -     Lipid panel -  TSH  Leukopenia, unspecified type Assessment & Plan: Has had persistent decreased white blood cell count/pancytopenia.  Discussed with hematology.  They recommended continuing to follow.  Orders: -     CBC with Differential/Platelet  Visit for screening mammogram -     3D Screening Mammogram, Left and Right;  Future  Gastroesophageal reflux disease, unspecified whether esophagitis present Assessment & Plan: On Aciphex. Off carafate. Controlled. Follow.   Orders: -     IBC + Ferritin  Aortic atherosclerosis (HCC) Assessment & Plan: Continue crestor.    Anxiety Assessment & Plan: Continue cymbalta.  Taking xanax 1/2 q hs.  Discussed. Plans to discuss with psychiatry. Follow.    Major depressive disorder, single episode, mild (HCC) Assessment & Plan: Continue trazodone, cymbalta.  Continue to follow up with psychiatry. Stable.  Follow.    Chronic migraine without aura without status migrainosus, not intractable Assessment & Plan: Followed by neurology.  Stable.  Neck aggravates.  Continue f/u with NSU for treatment - neck pain. Follow.    Neck pain Assessment & Plan: Being followed by ortho for neck pain.  S/p C7-T1 ESI - last 05/15/23.  Did help some. Still with neck pain.  Some days worse than others.  Feel aggravates her headaches.  Continues f/u with ortho   Pancytopenia [facility]) Assessment & Plan: Have discussed with hematology.  Follow cbc.    Thrombocytopenia (HCC) Assessment & Plan: Have discussed with hematology.  Follow cbc.       Dale Lynch, MD

## 2023-07-01 NOTE — Assessment & Plan Note (Signed)
Continue Crestor.  Low-cholesterol diet and exercise.  Follow lipid panel liver function test. 

## 2023-07-01 NOTE — Assessment & Plan Note (Signed)
Being followed by urology as outlined.  Evaluated 04/10/23. Being followed for kidney stone.  Work up in 2019 NED -no reports of gross heme -UA negative for micro heme Return in about 2 years (around 04/09/2025) for KUB, UA and OAB

## 2023-07-01 NOTE — Assessment & Plan Note (Signed)
Physical today 07/01/23.  Mammogram 07/10/22 - Birads I.  Schedule f/u mammogram.  Colonoscopy 09/26/18 - recommended f/u colonoscopy in 5 years.

## 2023-07-01 NOTE — Assessment & Plan Note (Signed)
Continue cymbalta.  Taking xanax 1/2 q hs.  Discussed. Plans to discuss with psychiatry. Follow.

## 2023-07-01 NOTE — Assessment & Plan Note (Addendum)
Followed by neurology.  Stable.  Neck aggravates.  Continue f/u with NSU for treatment - neck pain. Follow.

## 2023-07-02 ENCOUNTER — Ambulatory Visit: Payer: Medicare Other | Admitting: Physician Assistant

## 2023-07-03 ENCOUNTER — Other Ambulatory Visit: Payer: Medicare Other

## 2023-07-09 ENCOUNTER — Encounter: Payer: Medicare Other | Admitting: Internal Medicine

## 2023-07-10 ENCOUNTER — Other Ambulatory Visit: Payer: Medicare Other

## 2023-07-10 DIAGNOSIS — N3001 Acute cystitis with hematuria: Secondary | ICD-10-CM | POA: Diagnosis not present

## 2023-07-10 LAB — URINALYSIS, COMPLETE
Bilirubin, UA: NEGATIVE
Glucose, UA: NEGATIVE
Ketones, UA: NEGATIVE
Nitrite, UA: NEGATIVE
Protein,UA: NEGATIVE
RBC, UA: NEGATIVE
Specific Gravity, UA: 1.015 (ref 1.005–1.030)
Urobilinogen, Ur: 0.2 mg/dL (ref 0.2–1.0)
pH, UA: 7 (ref 5.0–7.5)

## 2023-07-10 LAB — MICROSCOPIC EXAMINATION

## 2023-07-16 ENCOUNTER — Ambulatory Visit
Admission: RE | Admit: 2023-07-16 | Discharge: 2023-07-16 | Disposition: A | Discharge status: Home / Self Care | Payer: Medicare Other | Source: Ambulatory Visit | Attending: Internal Medicine | Admitting: Internal Medicine

## 2023-07-16 DIAGNOSIS — Z1231 Encounter for screening mammogram for malignant neoplasm of breast: Secondary | ICD-10-CM | POA: Diagnosis not present

## 2023-07-23 DIAGNOSIS — H04123 Dry eye syndrome of bilateral lacrimal glands: Secondary | ICD-10-CM | POA: Diagnosis not present

## 2023-07-23 DIAGNOSIS — D3131 Benign neoplasm of right choroid: Secondary | ICD-10-CM | POA: Diagnosis not present

## 2023-07-23 DIAGNOSIS — Z0100 Encounter for examination of eyes and vision without abnormal findings: Secondary | ICD-10-CM | POA: Diagnosis not present

## 2023-07-23 DIAGNOSIS — H40003 Preglaucoma, unspecified, bilateral: Secondary | ICD-10-CM | POA: Diagnosis not present

## 2023-07-23 DIAGNOSIS — H2513 Age-related nuclear cataract, bilateral: Secondary | ICD-10-CM | POA: Diagnosis not present

## 2023-07-23 DIAGNOSIS — H538 Other visual disturbances: Secondary | ICD-10-CM | POA: Diagnosis not present

## 2023-08-01 DIAGNOSIS — M47812 Spondylosis without myelopathy or radiculopathy, cervical region: Secondary | ICD-10-CM | POA: Diagnosis not present

## 2023-08-01 DIAGNOSIS — M5412 Radiculopathy, cervical region: Secondary | ICD-10-CM | POA: Diagnosis not present

## 2023-08-01 DIAGNOSIS — M542 Cervicalgia: Secondary | ICD-10-CM | POA: Diagnosis not present

## 2023-08-05 DIAGNOSIS — G5762 Lesion of plantar nerve, left lower limb: Secondary | ICD-10-CM | POA: Diagnosis not present

## 2023-08-06 ENCOUNTER — Encounter: Payer: Self-pay | Admitting: Internal Medicine

## 2023-08-06 DIAGNOSIS — G5762 Lesion of plantar nerve, left lower limb: Secondary | ICD-10-CM | POA: Insufficient documentation

## 2023-08-19 DIAGNOSIS — F410 Panic disorder [episodic paroxysmal anxiety] without agoraphobia: Secondary | ICD-10-CM | POA: Diagnosis not present

## 2023-08-19 DIAGNOSIS — F3341 Major depressive disorder, recurrent, in partial remission: Secondary | ICD-10-CM | POA: Diagnosis not present

## 2023-08-19 DIAGNOSIS — F411 Generalized anxiety disorder: Secondary | ICD-10-CM | POA: Diagnosis not present

## 2023-09-16 DIAGNOSIS — H2511 Age-related nuclear cataract, right eye: Secondary | ICD-10-CM | POA: Diagnosis not present

## 2023-09-16 DIAGNOSIS — H52223 Regular astigmatism, bilateral: Secondary | ICD-10-CM | POA: Diagnosis not present

## 2023-09-18 ENCOUNTER — Encounter: Payer: Self-pay | Admitting: Ophthalmology

## 2023-09-23 DIAGNOSIS — F410 Panic disorder [episodic paroxysmal anxiety] without agoraphobia: Secondary | ICD-10-CM | POA: Diagnosis not present

## 2023-09-23 DIAGNOSIS — F411 Generalized anxiety disorder: Secondary | ICD-10-CM | POA: Diagnosis not present

## 2023-09-23 DIAGNOSIS — F3341 Major depressive disorder, recurrent, in partial remission: Secondary | ICD-10-CM | POA: Diagnosis not present

## 2023-09-25 ENCOUNTER — Other Ambulatory Visit: Payer: Self-pay

## 2023-09-25 ENCOUNTER — Encounter
Admission: RE | Disposition: A | Discharge status: Home / Self Care | Payer: Self-pay | Source: Home / Self Care | Attending: Ophthalmology

## 2023-09-25 ENCOUNTER — Ambulatory Visit: Payer: Self-pay | Admitting: General Practice

## 2023-09-25 ENCOUNTER — Ambulatory Visit
Admission: RE | Admit: 2023-09-25 | Discharge: 2023-09-25 | Disposition: A | Discharge status: Home / Self Care | Payer: Medicare Other | Attending: Ophthalmology | Admitting: Ophthalmology

## 2023-09-25 ENCOUNTER — Encounter: Payer: Self-pay | Admitting: Ophthalmology

## 2023-09-25 DIAGNOSIS — K219 Gastro-esophageal reflux disease without esophagitis: Secondary | ICD-10-CM | POA: Insufficient documentation

## 2023-09-25 DIAGNOSIS — H2511 Age-related nuclear cataract, right eye: Secondary | ICD-10-CM | POA: Diagnosis not present

## 2023-09-25 DIAGNOSIS — F418 Other specified anxiety disorders: Secondary | ICD-10-CM | POA: Insufficient documentation

## 2023-09-25 DIAGNOSIS — Z8711 Personal history of peptic ulcer disease: Secondary | ICD-10-CM | POA: Insufficient documentation

## 2023-09-25 DIAGNOSIS — G709 Myoneural disorder, unspecified: Secondary | ICD-10-CM | POA: Insufficient documentation

## 2023-09-25 DIAGNOSIS — M1990 Unspecified osteoarthritis, unspecified site: Secondary | ICD-10-CM | POA: Insufficient documentation

## 2023-09-25 HISTORY — PX: CATARACT EXTRACTION W/PHACO: SHX586

## 2023-09-25 SURGERY — PHACOEMULSIFICATION, CATARACT, WITH IOL INSERTION
Anesthesia: Monitor Anesthesia Care | Site: Eye | Laterality: Right

## 2023-09-25 MED ORDER — CEFUROXIME OPHTHALMIC INJECTION 1 MG/0.1 ML
INJECTION | OPHTHALMIC | Status: DC | PRN
  Administered 2023-09-25: .1 mL via INTRACAMERAL

## 2023-09-25 MED ORDER — MIDAZOLAM HCL 5 MG/5ML IJ SOLN
INTRAMUSCULAR | Status: DC | PRN
  Administered 2023-09-25: 1 mg via INTRAVENOUS

## 2023-09-25 MED ORDER — BRIMONIDINE TARTRATE-TIMOLOL 0.2-0.5 % OP SOLN
OPHTHALMIC | Status: DC | PRN
  Administered 2023-09-25: 1 [drp] via OPHTHALMIC

## 2023-09-25 MED ORDER — SIGHTPATH DOSE#1 BSS IO SOLN
INTRAOCULAR | Status: DC | PRN
  Administered 2023-09-25: 15 mL

## 2023-09-25 MED ORDER — SIGHTPATH DOSE#1 BSS IO SOLN
INTRAOCULAR | Status: DC | PRN
  Administered 2023-09-25: 1 mL via INTRAMUSCULAR

## 2023-09-25 MED ORDER — ARMC OPHTHALMIC DILATING DROPS
OPHTHALMIC | Status: AC
  Filled 2023-09-25: qty 0.5

## 2023-09-25 MED ORDER — TETRACAINE HCL 0.5 % OP SOLN
OPHTHALMIC | Status: AC
  Filled 2023-09-25: qty 4

## 2023-09-25 MED ORDER — ARMC OPHTHALMIC DILATING DROPS
1.0000 | OPHTHALMIC | Status: DC | PRN
  Administered 2023-09-25 (×3): 1 via OPHTHALMIC

## 2023-09-25 MED ORDER — SIGHTPATH DOSE#1 NA HYALUR & NA CHOND-NA HYALUR IO KIT
PACK | INTRAOCULAR | Status: DC | PRN
  Administered 2023-09-25: 1 via OPHTHALMIC

## 2023-09-25 MED ORDER — MIDAZOLAM HCL 2 MG/2ML IJ SOLN
INTRAMUSCULAR | Status: AC
Start: 2023-09-25 — End: ?
  Filled 2023-09-25: qty 2

## 2023-09-25 MED ORDER — FENTANYL CITRATE (PF) 100 MCG/2ML IJ SOLN
INTRAMUSCULAR | Status: AC
  Filled 2023-09-25: qty 2

## 2023-09-25 MED ORDER — TETRACAINE HCL 0.5 % OP SOLN
1.0000 [drp] | OPHTHALMIC | Status: DC | PRN
  Administered 2023-09-25 (×3): 1 [drp] via OPHTHALMIC

## 2023-09-25 MED ORDER — FENTANYL CITRATE (PF) 100 MCG/2ML IJ SOLN
INTRAMUSCULAR | Status: DC | PRN
  Administered 2023-09-25: 50 ug via INTRAVENOUS

## 2023-09-25 MED ORDER — SIGHTPATH DOSE#1 BSS IO SOLN
INTRAOCULAR | Status: DC | PRN
  Administered 2023-09-25: 61 mL via OPHTHALMIC

## 2023-09-25 SURGICAL SUPPLY — 9 items
CATARACT SUITE SIGHTPATH (MISCELLANEOUS) ×1
FEE CATARACT SUITE SIGHTPATH (MISCELLANEOUS) ×1 IMPLANT
GLOVE SRG 8 PF TXTR STRL LF DI (GLOVE) ×1 IMPLANT
GLOVE SURG ENC TEXT LTX SZ7.5 (GLOVE) ×1 IMPLANT
LENS CLRN VIVITY TORIC 21.5 ×1 IMPLANT
LENS IOL CLRN VT TRC 6 21.5 IMPLANT
NDL FILTER BLUNT 18X1 1/2 (NEEDLE) ×1 IMPLANT
NEEDLE FILTER BLUNT 18X1 1/2 (NEEDLE) ×1
SYR 3ML LL SCALE MARK (SYRINGE) ×1 IMPLANT

## 2023-09-25 NOTE — H&P (Signed)
 [facility]   Primary Care Physician:  Dellar Fenton, MD Ophthalmologist: Dr. Annell Kidney  Pre-Procedure History & Physical: HPI:  Brandi Ramos is a 78 y.o. female here for ophthalmic surgery.   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     Past Surgical History:  Procedure Laterality Date   ABDOMINAL HYSTERECTOMY  1984   COLONOSCOPY     COLONOSCOPY WITH PROPOFOL  N/A 09/26/2018   Procedure: COLONOSCOPY WITH PROPOFOL ;  Surgeon: Deveron Fly, MD;  Location: [facility] ENDOSCOPY;  Service: Endoscopy;  Laterality: N/A;   ESOPHAGOGASTRODUODENOSCOPY (EGD) WITH PROPOFOL  N/A 10/31/2015   Procedure: ESOPHAGOGASTRODUODENOSCOPY (EGD) WITH PROPOFOL ;  Surgeon: Deveron Fly, MD;  Location: [facility] ENDOSCOPY;  Service: Endoscopy;  Laterality: N/A;   ESOPHAGOGASTRODUODENOSCOPY (EGD) WITH PROPOFOL  N/A 09/26/2018   Procedure: ESOPHAGOGASTRODUODENOSCOPY (EGD) WITH PROPOFOL ;  Surgeon: Deveron Fly, MD;  Location: [facility] ENDOSCOPY;  Service: Endoscopy;  Laterality: N/A;   LAPAROSCOPIC APPENDECTOMY N/A 10/29/2018   Procedure: APPENDECTOMY LAPAROSCOPIC;  Surgeon: Eldred Grego, MD;  Location: [facility] ORS;  Service: General;  Laterality: N/A;   LAPAROSCOPIC BILATERAL SALPINGO OOPHERECTOMY Bilateral 10/29/2018   Procedure: LAPAROSCOPIC BILATERAL SALPINGO OOPHORECTOMY;  Surgeon: Ward, Margarie Shay, MD;  Location: [facility] ORS;  Service: Gynecology;  Laterality: Bilateral;    Prior to Admission  medications   Medication Sig Start Date End Date Taking? Authorizing Provider  ALPRAZolam  (NIRAVAM ) 0.25 MG dissolvable tablet Take 0.25 mg by mouth as needed for anxiety.   Yes [provider]  DULoxetine  (CYMBALTA ) 60 MG capsule Take 1 capsule (60 mg total) by mouth daily. 03/27/23  Yes Dellar Fenton, MD  Fe Fum-FePoly-Vit C-Vit B3 (INTEGRA) 62.5-62.5-40-3 MG CAPS Take 1 capsule by mouth daily. 10/31/22  Yes Dellar Fenton, MD  fexofenadine (ALLEGRA) 180 MG tablet Take 180 mg by mouth daily as needed for allergies or rhinitis.   Yes [provider]  fluticasone  (FLONASE ) 50 MCG/ACT nasal spray Place 2 sprays into both nostrils daily. 10/19/22  Yes Kaur, Charanpreet, NP  Magnesium  Oxide, Elemental, 400 MG TABS Take one tablet per day 10/31/22  Yes Dellar Fenton, MD  ondansetron  (ZOFRAN  ODT) 4 MG disintegrating tablet Take 1 tablet (4 mg total) by mouth 2 (two) times daily as needed for nausea or vomiting. 06/07/20  Yes Dellar Fenton, MD  propranolol  ER (INDERAL  LA) 60 MG 24 hr capsule TK 1 C PO D 02/27/19  Yes [provider]  PSEUDOEPHEDRINE HCL PO Take by mouth as needed.   Yes [provider]  RABEprazole  (ACIPHEX ) 20 MG tablet Take 1 tablet (20 mg total) by mouth 2 (two) times daily before a meal. 03/27/23  Yes Dellar Fenton, MD  rizatriptan (MAXALT) 10 MG tablet Take 10 mg by mouth as needed. May repeat in 2 hours if needed   Yes [provider]  rosuvastatin  (CRESTOR ) 5 MG tablet Take  1 tablet (5 mg total) by mouth daily. 03/29/23  Yes Dellar Fenton, MD  sucralfate  (CARAFATE ) 1 g tablet TAKE 1 TABLET(1 GRAM) BY MOUTH TWICE DAILY AS NEEDED 10/31/22  Yes Dellar Fenton, MD  SUMAtriptan (IMITREX) 100 MG tablet  02/02/19  Yes [provider]  topiramate  (TOPAMAX ) 50 MG tablet TAKE 1 AND 1/2 TABLET BY MOUTH IN MORNING AND 1 TABLET BY MOUTH IN EVENING 09/16/19  Yes [provider]  traZODone (DESYREL) 50 MG tablet Take 50 mg by mouth at  bedtime as needed for sleep.   Yes [provider]    Allergies as of 07/29/2023 - Review Complete 07/01/2023  Allergen Reaction Noted   Amitiza [lubiprostone] Other (See Comments) 09/21/2012   Sulfa antibiotics  09/21/2012    Family History  Problem Relation Age of Onset   CAD Mother        s/p CABG   Hypercholesterolemia Mother    Breast cancer Mother 33   Heart disease Mother    Lung cancer Mother    Hypertension Father    Migraines Father    Hypercholesterolemia Father    Lung cancer Father    Migraines Sister    Arthritis Sister    Lymphoma Other        grandmother   Colon cancer Neg Hx     Social History   Socioeconomic History   Marital status: Widowed    Spouse name: Not on file   Number of children: 2   Years of education: Not on file   Highest education level: 12th grade  Occupational History   Not on file  Tobacco Use   Smoking status: Never   Smokeless tobacco: Never  Vaping Use   Vaping status: Never Used  Substance and Sexual Activity   Alcohol use: Yes    Alcohol/week: 0.0 standard drinks of alcohol    Comment: wine occassionally   Drug use: No   Sexual activity: Not Currently  Other Topics Concern   Not on file  Social History Narrative   Not on file   Social Drivers of Health   Financial Resource Strain: Low Risk  (12/20/2022)   Overall Financial Resource Strain (CARDIA)    Difficulty of Paying Living Expenses: Not hard at all  Food Insecurity: No Food Insecurity (06/28/2023)   Hunger Vital Sign    Worried About Running Out of Food in the Last Year: Never true    Ran Out of Food in the Last Year: Never true  Transportation Needs: No Transportation Needs (06/28/2023)   PRAPARE - Administrator, Civil Service (Medical): No    Lack of Transportation (Non-Medical): No  Physical Activity: Insufficiently Active (06/28/2023)   Exercise Vital Sign    Days of Exercise per Week: 3 days    Minutes of Exercise per Session:  40 min  Stress: No Stress Concern Present (12/20/2022)   [employer] of Occupational Health - Occupational Stress Questionnaire    Feeling of Stress : Not at all  Social Connections: Moderately Integrated (06/28/2023)   Social Connection and Isolation Panel [NHANES]    Frequency of Communication with Friends and Family: Three times a week    Frequency of Social Gatherings with Friends and Family: More than three times a week    Attends Religious Services: More than 4 times per year    Active Member of Clubs or Organizations: Yes    Attends Banker Meetings: More than 4 times per year  Marital Status: Widowed  Intimate Partner Violence: Not At Risk (12/20/2022)   Humiliation, Afraid, Rape, and Kick questionnaire    Fear of Current or Ex-Partner: No    Emotionally Abused: No    Physically Abused: No    Sexually Abused: No    Review of Systems: See HPI, otherwise negative ROS  Physical Exam: BP 124/65   Pulse 67   Temp (!) 97.5 F (36.4 C) (Temporal)   Resp 14   Ht 5\' 5"  (1.651 m)   Wt 52.3 kg   SpO2 100%   BMI 19.19 kg/m  General:   Alert,  pleasant and cooperative in NAD Head:  Normocephalic and atraumatic. Lungs:  Clear to auscultation.    Heart:  Regular rate and rhythm.   Impression/Plan: Brandi Ramos is here for ophthalmic surgery.  Risks, benefits, limitations, and alternatives regarding ophthalmic surgery have been reviewed with the patient.  Questions have been answered.  All parties agreeable.   Annell Kidney, MD  09/25/2023, 8:27 AM

## 2023-09-25 NOTE — Anesthesia Postprocedure Evaluation (Signed)
 Anesthesia Post Note  Patient: Brandi Ramos  Procedure(s) Performed: CATARACT EXTRACTION PHACO AND INTRAOCULAR LENS PLACEMENT (IOC) RIGHT CLAREON VIVITY TORIC  5.25  00:35.3 (Right: Eye)  Patient location during evaluation: PACU Anesthesia Type: MAC Level of consciousness: awake and alert Pain management: pain level controlled Vital Signs Assessment: post-procedure vital signs reviewed and stable Respiratory status: spontaneous breathing, nonlabored ventilation, respiratory function stable and patient connected to nasal cannula oxygen Cardiovascular status: stable and blood pressure returned to baseline Postop Assessment: no apparent nausea or vomiting Anesthetic complications: no   No notable events documented.   Last Vitals:  Vitals:   09/25/23 0905 09/25/23 0910  BP: 120/62 117/67  Pulse: 65 64  Resp: (!) 22 16  Temp:  36.5 C  SpO2: 100% 98%    Last Pain:  Vitals:   09/25/23 0910  TempSrc:   PainSc: 0-No pain                 Emilie Harden

## 2023-09-25 NOTE — Anesthesia Preprocedure Evaluation (Signed)
 Anesthesia Evaluation  Patient identified by MRN, date of birth, ID band Patient awake    Reviewed: Allergy & Precautions, H&P , NPO status , Patient's Chart, lab work & pertinent test results  Airway Mallampati: III  TM Distance: <3 FB Neck ROM: Full    Dental no notable dental hx.    Pulmonary neg pulmonary ROS   Pulmonary exam normal breath sounds clear to auscultation       Cardiovascular negative cardio ROS Normal cardiovascular exam Rhythm:Regular Rate:Normal     Neuro/Psych  Headaches PSYCHIATRIC DISORDERS Anxiety Depression     Neuromuscular disease negative neurological ROS  negative psych ROS   GI/Hepatic negative GI ROS, Neg liver ROS, PUD,GERD  ,,  Endo/Other  negative endocrine ROS    Renal/GU Renal diseasenegative Renal ROS  negative genitourinary   Musculoskeletal negative musculoskeletal ROS (+) Arthritis ,    Abdominal   Peds negative pediatric ROS (+)  Hematology negative hematology ROS (+) Blood dyscrasia, anemia   Anesthesia Other Findings Neck pain Myofascial pain Migraine headache Neutropenia (HCC) PUD (peptic ulcer disease) Anxiety Fibrocystic breast disease Diverticulosis GERD (gastroesophageal reflux disease) Arthritis Depression Frozen shoulder Fundic gland polyps of stomach, benign Seasonal allergies  Kidney stones Skin cancer, basal cell      Reproductive/Obstetrics negative OB ROS                              Anesthesia Physical Anesthesia Plan  ASA: 2  Anesthesia Plan: MAC   Post-op Pain Management:    Induction: Intravenous  PONV Risk Score and Plan:   Airway Management Planned: Natural Airway and Nasal Cannula  Additional Equipment:   Intra-op Plan:   Post-operative Plan:   Informed Consent: I have reviewed the patients History and Physical, chart, labs and discussed the procedure including the risks, benefits and alternatives  for the proposed anesthesia with the patient or authorized representative who has indicated his/her understanding and acceptance.     Dental Advisory Given  Plan Discussed with: Anesthesiologist, CRNA and Surgeon  Anesthesia Plan Comments: (Patient consented for risks of anesthesia including but not limited to:  - adverse reactions to medications - damage to eyes, teeth, lips or other oral mucosa - nerve damage due to positioning  - sore throat or hoarseness - Damage to heart, brain, nerves, lungs, other parts of body or loss of life  Patient voiced understanding and assent.)         Anesthesia Quick Evaluation

## 2023-09-25 NOTE — Transfer of Care (Signed)
 Immediate Anesthesia Transfer of Care Note  Patient: Brandi Ramos  Procedure(s) Performed: CATARACT EXTRACTION PHACO AND INTRAOCULAR LENS PLACEMENT (IOC) RIGHT CLAREON VIVITY TORIC  5.25  00:35.3 (Right: Eye)  Patient Location: PACU  Anesthesia Type: MAC  Level of Consciousness: awake, alert  and patient cooperative  Airway and Oxygen Therapy: Patient Spontanous Breathing and Patient connected to supplemental oxygen  Post-op Assessment: Post-op Vital signs reviewed, Patient's Cardiovascular Status Stable, Respiratory Function Stable, Patent Airway and No signs of Nausea or vomiting  Post-op Vital Signs: Reviewed and stable  Complications: No notable events documented.

## 2023-09-25 NOTE — Discharge Instructions (Signed)

## 2023-09-25 NOTE — Op Note (Signed)
 LOCATION:  [facility]   PREOPERATIVE DIAGNOSIS:  Nuclear sclerotic cataract of the right eye.  H25.11   POSTOPERATIVE DIAGNOSIS:  Nuclear sclerotic cataract of the right eye.   PROCEDURE:  Phacoemulsification with Toric posterior chamber intraocular lens placement of the right eye.  Ultrasound time: Procedure(s): CATARACT EXTRACTION PHACO AND INTRAOCULAR LENS PLACEMENT (IOC) RIGHT CLAREON VIVITY TORIC  5.25  00:35.3 (Right)  LENS:   Implant Name Type Inv. Item Serial No. Manufacturer Lot No. LRB No. Used Action  LENS CLRN VIVITY TORIC 21.5 - [id number]  LENS CLRN VIVITY TORIC 21.5 [id number] SIGHTPATH  Right 1 Implanted     [id number] Toric intraocular lens with 3.75 diopters of cylindrical power with axis orientation at 13 degrees.   SURGEON:  Berline Brenner, MD   ANESTHESIA: Topical with tetracaine  drops and 2% Xylocaine  jelly, augmented with 1% preservative-free intracameral lidocaine . .   COMPLICATIONS:  None.   DESCRIPTION OF PROCEDURE:  The patient was identified in the holding room and transported to the operating suite and placed in the supine position under the operating microscope.  The right eye was identified as the operative eye, and it was prepped and draped in the usual sterile ophthalmic fashion.    A clear-corneal paracentesis incision was made at the 12:00 position.  0.5 ml of preservative-free 1% lidocaine  was injected into the anterior chamber. The anterior chamber was filled with Viscoat.  A 2.4 millimeter near clear corneal incision was then made at the 9:00 position.  A cystotome and capsulorrhexis forceps were then used to make a curvilinear capsulorrhexis.  Hydrodissection and hydrodelineation were then performed using balanced salt solution.   Phacoemulsification was then used in stop and chop fashion to remove the lens, nucleus and epinucleus.  The remaining cortex was aspirated using the irrigation and aspiration handpiece.  Provisc  viscoelastic was then placed into the capsular bag to distend it for lens placement.  The Verion digital marker was used to align the implant at the intended axis.   A Toric lens was then injected into the capsular bag.  It was rotated clockwise until the axis marks on the lens were approximately 15 degrees in the counterclockwise direction to the intended alignment.  The viscoelastic was aspirated from the eye using the irrigation aspiration handpiece.  Then, a Koch spatula through the sideport incision was used to rotate the lens in a clockwise direction until the axis markings of the intraocular lens were lined up with the Verion alignment.  Balanced salt solution was then used to hydrate the wounds. Cefuroxime  0.1 ml of a 10mg /ml solution was injected into the anterior chamber for a dose of 1 mg of intracameral antibiotic at the completion of the case.    The eye was noted to have a physiologic pressure and there was no wound leak noted.   Timolol  and Brimonidine  drops were applied to the eye.  The patient was taken to the recovery room in stable condition having had no complications of anesthesia or surgery.  [name] 09/25/2023, 9:02 AM

## 2023-09-26 ENCOUNTER — Encounter: Payer: Self-pay | Admitting: Ophthalmology

## 2023-09-26 DIAGNOSIS — H2512 Age-related nuclear cataract, left eye: Secondary | ICD-10-CM | POA: Diagnosis not present

## 2023-10-07 NOTE — Discharge Instructions (Signed)

## 2023-10-09 ENCOUNTER — Ambulatory Visit: Payer: Self-pay | Admitting: General Practice

## 2023-10-09 ENCOUNTER — Encounter
Admission: RE | Disposition: A | Discharge status: Home / Self Care | Payer: Self-pay | Source: Home / Self Care | Attending: Ophthalmology

## 2023-10-09 ENCOUNTER — Other Ambulatory Visit: Payer: Self-pay

## 2023-10-09 ENCOUNTER — Ambulatory Visit
Admission: RE | Admit: 2023-10-09 | Discharge: 2023-10-09 | Disposition: A | Discharge status: Home / Self Care | Payer: Medicare Other | Attending: Ophthalmology | Admitting: Ophthalmology

## 2023-10-09 ENCOUNTER — Encounter: Payer: Self-pay | Admitting: Ophthalmology

## 2023-10-09 DIAGNOSIS — D649 Anemia, unspecified: Secondary | ICD-10-CM | POA: Insufficient documentation

## 2023-10-09 DIAGNOSIS — Z8249 Family history of ischemic heart disease and other diseases of the circulatory system: Secondary | ICD-10-CM | POA: Insufficient documentation

## 2023-10-09 DIAGNOSIS — K219 Gastro-esophageal reflux disease without esophagitis: Secondary | ICD-10-CM | POA: Diagnosis not present

## 2023-10-09 DIAGNOSIS — M1990 Unspecified osteoarthritis, unspecified site: Secondary | ICD-10-CM | POA: Insufficient documentation

## 2023-10-09 DIAGNOSIS — Z8711 Personal history of peptic ulcer disease: Secondary | ICD-10-CM | POA: Diagnosis not present

## 2023-10-09 DIAGNOSIS — H2512 Age-related nuclear cataract, left eye: Secondary | ICD-10-CM | POA: Diagnosis not present

## 2023-10-09 DIAGNOSIS — H269 Unspecified cataract: Secondary | ICD-10-CM | POA: Diagnosis not present

## 2023-10-09 DIAGNOSIS — F418 Other specified anxiety disorders: Secondary | ICD-10-CM | POA: Diagnosis not present

## 2023-10-09 DIAGNOSIS — E7800 Pure hypercholesterolemia, unspecified: Secondary | ICD-10-CM | POA: Diagnosis not present

## 2023-10-09 DIAGNOSIS — G709 Myoneural disorder, unspecified: Secondary | ICD-10-CM | POA: Insufficient documentation

## 2023-10-09 HISTORY — PX: CATARACT EXTRACTION W/PHACO: SHX586

## 2023-10-09 SURGERY — PHACOEMULSIFICATION, CATARACT, WITH IOL INSERTION
Anesthesia: Monitor Anesthesia Care | Site: Eye | Laterality: Left

## 2023-10-09 MED ORDER — ARMC OPHTHALMIC DILATING DROPS
1.0000 | OPHTHALMIC | Status: DC | PRN
  Administered 2023-10-09 (×3): 1 via OPHTHALMIC

## 2023-10-09 MED ORDER — FENTANYL CITRATE (PF) 100 MCG/2ML IJ SOLN
INTRAMUSCULAR | Status: DC | PRN
  Administered 2023-10-09: 50 ug via INTRAVENOUS

## 2023-10-09 MED ORDER — BRIMONIDINE TARTRATE-TIMOLOL 0.2-0.5 % OP SOLN
OPHTHALMIC | Status: DC | PRN
  Administered 2023-10-09: 1 [drp] via OPHTHALMIC

## 2023-10-09 MED ORDER — MIDAZOLAM HCL 2 MG/2ML IJ SOLN
INTRAMUSCULAR | Status: AC
  Filled 2023-10-09: qty 2

## 2023-10-09 MED ORDER — FENTANYL CITRATE (PF) 100 MCG/2ML IJ SOLN
INTRAMUSCULAR | Status: AC
  Filled 2023-10-09: qty 2

## 2023-10-09 MED ORDER — TETRACAINE HCL 0.5 % OP SOLN
1.0000 [drp] | OPHTHALMIC | Status: DC | PRN
  Administered 2023-10-09 (×3): 1 [drp] via OPHTHALMIC

## 2023-10-09 MED ORDER — TETRACAINE HCL 0.5 % OP SOLN
OPHTHALMIC | Status: AC
  Filled 2023-10-09: qty 4

## 2023-10-09 MED ORDER — ARMC OPHTHALMIC DILATING DROPS
OPHTHALMIC | Status: AC
Start: 2023-10-09 — End: ?
  Filled 2023-10-09: qty 0.5

## 2023-10-09 MED ORDER — CEFUROXIME OPHTHALMIC INJECTION 1 MG/0.1 ML
INJECTION | OPHTHALMIC | Status: DC | PRN
  Administered 2023-10-09: .1 mL via INTRACAMERAL

## 2023-10-09 MED ORDER — SIGHTPATH DOSE#1 NA HYALUR & NA CHOND-NA HYALUR IO KIT
PACK | INTRAOCULAR | Status: DC | PRN
  Administered 2023-10-09: 1 via OPHTHALMIC

## 2023-10-09 MED ORDER — SIGHTPATH DOSE#1 BSS IO SOLN
INTRAOCULAR | Status: DC | PRN
  Administered 2023-10-09: 55 mL via OPHTHALMIC

## 2023-10-09 MED ORDER — MIDAZOLAM HCL 2 MG/2ML IJ SOLN
INTRAMUSCULAR | Status: DC | PRN
  Administered 2023-10-09: 1 mg via INTRAVENOUS

## 2023-10-09 MED ORDER — SIGHTPATH DOSE#1 BSS IO SOLN
INTRAOCULAR | Status: DC | PRN
  Administered 2023-10-09: 15 mL

## 2023-10-09 MED ORDER — SIGHTPATH DOSE#1 BSS IO SOLN
INTRAOCULAR | Status: DC | PRN
  Administered 2023-10-09: 1 mL

## 2023-10-09 SURGICAL SUPPLY — 18 items
CANNULA ANT/CHMB 27G (MISCELLANEOUS) IMPLANT
CANNULA ANT/CHMB 27GA (MISCELLANEOUS)
CATARACT SUITE SIGHTPATH (MISCELLANEOUS) ×1
FEE CATARACT SUITE SIGHTPATH (MISCELLANEOUS) ×1 IMPLANT
GLOVE SRG 8 PF TXTR STRL LF DI (GLOVE) ×1 IMPLANT
GLOVE SURG ENC TEXT LTX SZ7.5 (GLOVE) ×1 IMPLANT
GLOVE SURG GAMMEX PI TX LF 7.5 (GLOVE) IMPLANT
LENS CLRN VIVITY TORIC 5 20 ×1 IMPLANT
LENS IOL CLRN VT TRC 5 20.0 IMPLANT
NDL FILTER BLUNT 18X1 1/2 (NEEDLE) ×1 IMPLANT
NDL RETROBULBAR .5 NSTRL (NEEDLE) IMPLANT
NEEDLE FILTER BLUNT 18X1 1/2 (NEEDLE) ×1
PACK VIT ANT 23G (MISCELLANEOUS) IMPLANT
RING MALYGIN 7.0 (MISCELLANEOUS) IMPLANT
SUT ETHILON 10-0 CS-B-6CS-B-6 (SUTURE)
SUT VICRYL 9 0 (SUTURE) IMPLANT
SUTURE EHLN 10-0 CS-B-6CS-B-6 (SUTURE) IMPLANT
SYR 3ML LL SCALE MARK (SYRINGE) ×1 IMPLANT

## 2023-10-09 NOTE — Anesthesia Preprocedure Evaluation (Signed)
Anesthesia Evaluation    Airway Mallampati: II  TM Distance: <3 FB     Dental   Pulmonary           Cardiovascular      Neuro/Psych  Headaches PSYCHIATRIC DISORDERS Anxiety Depression     Neuromuscular disease    GI/Hepatic PUD,GERD  ,,  Endo/Other    Renal/GU Renal disease     Musculoskeletal  (+) Arthritis ,    Abdominal   Peds  Hematology  (+) Blood dyscrasia, anemia   Anesthesia Other Findings   Migraine headache Neutropenia (HCC) PUD (peptic ulcer disease) Anxiety Fibrocystic breast disease Diverticulosis GERD (gastroesophageal reflux disease) Arthritis Chicken pox Depression Frozen shoulder Fundic gland polyps of stomach, benign History of diverticulitis of colon Recurrent vaginitis Seasonal allergies Kidney stones Skin cancer, basal cell      Reproductive/Obstetrics                              Anesthesia Physical Anesthesia Plan  ASA: 2  Anesthesia Plan: MAC   Post-op Pain Management:    Induction: Intravenous  PONV Risk Score and Plan:   Airway Management Planned: Natural Airway and Nasal Cannula  Additional Equipment:   Intra-op Plan:   Post-operative Plan:   Informed Consent: I have reviewed the patients History and Physical, chart, labs and discussed the procedure including the risks, benefits and alternatives for the proposed anesthesia with the patient or authorized representative who has indicated his/her understanding and acceptance.     Dental Advisory Given  Plan Discussed with: Anesthesiologist, CRNA and Surgeon  Anesthesia Plan Comments: (Patient consented for risks of anesthesia including but not limited to:  - adverse reactions to medications - damage to eyes, teeth, lips or other oral mucosa - nerve damage due to positioning  - sore throat or hoarseness - Damage to heart, brain, nerves, lungs, other parts of body or loss of life  Patient  voiced understanding and assent.)         Anesthesia Quick Evaluation

## 2023-10-09 NOTE — Anesthesia Postprocedure Evaluation (Signed)
Anesthesia Post Note  Patient: Brandi Ramos  Procedure(s) Performed: CATARACT EXTRACTION PHACO AND INTRAOCULAR LENS PLACEMENT (IOC) LEFT  CLAREON VIVITY TORIC (Left: Eye)  Patient location during evaluation: PACU Anesthesia Type: MAC Level of consciousness: awake and alert Pain management: pain level controlled Vital Signs Assessment: post-procedure vital signs reviewed and stable Respiratory status: spontaneous breathing, nonlabored ventilation, respiratory function stable and patient connected to nasal cannula oxygen Cardiovascular status: stable and blood pressure returned to baseline Postop Assessment: no apparent nausea or vomiting Anesthetic complications: no   No notable events documented.   Last Vitals:  Vitals:   10/09/23 0933 10/09/23 0939  BP: (!) 96/45 117/66  Pulse:  60  Resp: 19 15  Temp: (!) 36.2 C   SpO2:  100%    Last Pain:  Vitals:   10/09/23 0939  TempSrc:   PainSc: 0-No pain                 Marisue Humble

## 2023-10-09 NOTE — Op Note (Signed)
LOCATION:  [facility]   PREOPERATIVE DIAGNOSIS:  Nuclear sclerotic cataract of the left eye.  H25.12  POSTOPERATIVE DIAGNOSIS:  Nuclear sclerotic cataract of the left eye.   PROCEDURE:  Phacoemulsification with Toric posterior chamber intraocular lens placement of the left eye.  Ultrasound time: Procedure(s) with comments: CATARACT EXTRACTION PHACO AND INTRAOCULAR LENS PLACEMENT (IOC) LEFT  CLAREON VIVITY TORIC (Left) - 7.50 0:33.4  LENS:   Implant Name Type Inv. Item Serial No. Manufacturer Lot No. LRB No. Used Action  LENS CLRN VIVITY TORIC 5 20 - [id number]  LENS CLRN VIVITY TORIC 5 20 [id number] [employer]IGHTPATH  Left 1 Implanted     CNWET3 20.0 Vivity Toric intraocular lens with 3.0 diopters of cylindrical power with axis orientation at 170 degrees.     SURGEON:  Deirdre Evener, MD   ANESTHESIA:  Topical with tetracaine drops and 2% Xylocaine jelly, augmented with 1% preservative-free intracameral lidocaine.  COMPLICATIONS:  None.   DESCRIPTION OF PROCEDURE:  The patient was identified in the holding room and transported to the operating suite and placed in the supine position under the operating microscope.  The left eye was identified as the operative eye, and it was prepped and draped in the usual sterile ophthalmic fashion.    A clear-corneal paracentesis incision was made at the 1:30 position.  0.5 ml of preservative-free 1% lidocaine was injected into the anterior chamber. The anterior chamber was filled with Viscoat.  A 2.4 millimeter near clear corneal incision was then made at the 10:30 position.  A cystotome and capsulorrhexis forceps were then used to make a curvilinear capsulorrhexis.  Hydrodissection and hydrodelineation were then performed using balanced salt solution.   Phacoemulsification was then used in stop and chop fashion to remove the lens, nucleus and epinucleus.  The remaining cortex was aspirated using the irrigation and aspiration handpiece.   Provisc viscoelastic was then placed into the capsular bag to distend it for lens placement.  The Verion digital marker was used to align the implant at the intended axis.   A Toric lens was then injected into the capsular bag.  It was rotated clockwise until the axis marks on the lens were approximately 15 degrees in the counterclockwise direction to the intended alignment.  The viscoelastic was aspirated from the eye using the irrigation aspiration handpiece.  Then, a Koch spatula through the sideport incision was used to rotate the lens in a clockwise direction until the axis markings of the intraocular lens were lined up with the Verion alignment.  Balanced salt solution was then used to hydrate the wounds. Cefuroxime 0.1 ml of a 10mg /ml solution was injected into the anterior chamber for a dose of 1 mg of intracameral antibiotic at the completion of the case.    The eye was noted to have a physiologic pressure and there was no wound leak noted.   Timolol and Brimonidine drops were applied to the eye.  The patient was taken to the recovery room in stable condition having had no complications of anesthesia or surgery.  Bradleigh Sonnen 10/09/2023, 9:32 AM

## 2023-10-09 NOTE — Transfer of Care (Signed)
Immediate Anesthesia Transfer of Care Note  Patient: Brandi Ramos  Procedure(s) Performed: CATARACT EXTRACTION PHACO AND INTRAOCULAR LENS PLACEMENT (IOC) LEFT  CLAREON VIVITY TORIC (Left: Eye)  Patient Location: PACU  Anesthesia Type: MAC  Level of Consciousness: awake, alert  and patient cooperative  Airway and Oxygen Therapy: Patient Spontanous Breathing and Patient connected to supplemental oxygen  Post-op Assessment: Post-op Vital signs reviewed, Patient's Cardiovascular Status Stable, Respiratory Function Stable, Patent Airway and No signs of Nausea or vomiting  Post-op Vital Signs: Reviewed and stable  Complications: No notable events documented.

## 2023-10-09 NOTE — H&P (Signed)
[facility]   Primary Care Physician:  Dale Stearns, MD Ophthalmologist: Dr. Lockie Mola  Pre-Procedure History & Physical: HPI:  Brandi Ramos is a 78 y.o. female here for ophthalmic surgery.   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     Past Surgical History:  Procedure Laterality Date   ABDOMINAL HYSTERECTOMY  1984   CATARACT EXTRACTION W/PHACO Right 09/25/2023   Procedure: CATARACT EXTRACTION PHACO AND INTRAOCULAR LENS PLACEMENT (IOC) RIGHT CLAREON VIVITY TORIC  5.25  00:35.3;  Surgeon: Lockie Mola, MD;  Location: [facility];  Service: Ophthalmology;  Laterality: Right;   COLONOSCOPY     COLONOSCOPY WITH PROPOFOL N/A 09/26/2018   Procedure: COLONOSCOPY WITH PROPOFOL;  Surgeon: Christena Deem, MD;  Location: [facility] ENDOSCOPY;  Service: Endoscopy;  Laterality: N/A;   ESOPHAGOGASTRODUODENOSCOPY (EGD) WITH PROPOFOL N/A 10/31/2015   Procedure: ESOPHAGOGASTRODUODENOSCOPY (EGD) WITH PROPOFOL;  Surgeon: Christena Deem, MD;  Location: [facility] ENDOSCOPY;  Service: Endoscopy;  Laterality: N/A;   ESOPHAGOGASTRODUODENOSCOPY (EGD) WITH PROPOFOL N/A 09/26/2018   Procedure: ESOPHAGOGASTRODUODENOSCOPY (EGD) WITH PROPOFOL;  Surgeon: Christena Deem, MD;  Location: [facility] ENDOSCOPY;  Service: Endoscopy;  Laterality: N/A;   LAPAROSCOPIC APPENDECTOMY N/A 10/29/2018   Procedure: APPENDECTOMY LAPAROSCOPIC;  Surgeon: Carolan Shiver, MD;  Location: [facility] ORS;  Service:  General;  Laterality: N/A;   LAPAROSCOPIC BILATERAL SALPINGO OOPHERECTOMY Bilateral 10/29/2018   Procedure: LAPAROSCOPIC BILATERAL SALPINGO OOPHORECTOMY;  Surgeon: Ward, Elenora Fender, MD;  Location: [facility] ORS;  Service: Gynecology;  Laterality: Bilateral;    Prior to Admission medications   Medication Sig Start Date End Date Taking? Authorizing Provider  ALPRAZolam (NIRAVAM) 0.25 MG dissolvable tablet Take 0.25 mg by mouth as needed for anxiety.   Yes [provider]  DULoxetine (CYMBALTA) 60 MG capsule Take 1 capsule (60 mg total) by mouth daily. 03/27/23  Yes Dale Venango, MD  fexofenadine (ALLEGRA) 180 MG tablet Take 180 mg by mouth daily as needed for allergies or rhinitis.   Yes [provider]  propranolol ER (INDERAL LA) 60 MG 24 hr capsule TK 1 C PO D 02/27/19  Yes [provider]  RABEprazole (ACIPHEX) 20 MG tablet Take 1 tablet (20 mg total) by mouth 2 (two) times daily before a meal. 03/27/23  Yes Dale [place], MD  rizatriptan (MAXALT) 10 MG tablet Take 10 mg by mouth as needed. May repeat in 2 hours if needed   Yes [provider]  rosuvastatin (CRESTOR) 5 MG tablet Take 1 tablet (5 mg total) by mouth daily. 03/29/23  Yes Dale [place], MD  sucralfate (CARAFATE) 1 g tablet TAKE 1 TABLET(1 GRAM) BY MOUTH TWICE DAILY AS NEEDED 10/31/22  Yes Dale McFarland, MD  traZODone (DESYREL) 50 MG tablet Take 50 mg by mouth at bedtime as needed for sleep.   Yes [provider]  Fe Fum-FePoly-Vit C-Vit B3 (  INTEGRA) 62.5-62.5-40-3 MG CAPS Take 1 capsule by mouth daily. 10/31/22   Dale Wheeler, MD  fluticasone (FLONASE) 50 MCG/ACT nasal spray Place 2 sprays into both nostrils daily. 10/19/22   Kara Dies, NP  Magnesium Oxide, Elemental, 400 MG TABS Take one tablet per day 10/31/22   Dale Guadalupe, MD  ondansetron (ZOFRAN ODT) 4 MG disintegrating tablet Take 1 tablet (4 mg total) by mouth 2 (two) times daily as needed for nausea or vomiting. 06/07/20    Dale [place], MD  PSEUDOEPHEDRINE HCL PO Take by mouth as needed.    [provider]  SUMAtriptan (IMITREX) 100 MG tablet  02/02/19   [provider]  topiramate (TOPAMAX) 50 MG tablet TAKE 1 AND 1/2 TABLET BY MOUTH IN MORNING AND 1 TABLET BY MOUTH IN EVENING 09/16/19   [provider]    Allergies as of 07/29/2023 - Review Complete 07/01/2023  Allergen Reaction Noted   Amitiza [lubiprostone] Other (See Comments) 09/21/2012   Sulfa antibiotics  09/21/2012    Family History  Problem Relation Age of Onset   CAD Mother        s/p CABG   Hypercholesterolemia Mother    Breast cancer Mother 41   Heart disease Mother    Lung cancer Mother    Hypertension Father    Migraines Father    Hypercholesterolemia Father    Lung cancer Father    Migraines Sister    Arthritis Sister    Lymphoma Other        grandmother   Colon cancer Neg Hx     Social History   Socioeconomic History   Marital status: Widowed    Spouse name: Not on file   Number of children: 2   Years of education: Not on file   Highest education level: 12th grade  Occupational History   Not on file  Tobacco Use   Smoking status: Never   Smokeless tobacco: Never  Vaping Use   Vaping status: Never Used  Substance and Sexual Activity   Alcohol use: Yes    Alcohol/week: 0.0 standard drinks of alcohol    Comment: wine occassionally   Drug use: No   Sexual activity: Not Currently  Other Topics Concern   Not on file  Social History Narrative   Not on file   Social Drivers of Health   Financial Resource Strain: Low Risk  (12/20/2022)   Overall Financial Resource Strain (CARDIA)    Difficulty of Paying Living Expenses: Not hard at all  Food Insecurity: No Food Insecurity (06/28/2023)   Hunger Vital Sign    Worried About Running Out of Food in the Last Year: Never true    Ran Out of Food in the Last Year: Never true  Transportation Needs: No Transportation Needs (06/28/2023)   PRAPARE  - Administrator, Civil Service (Medical): No    Lack of Transportation (Non-Medical): No  Physical Activity: Insufficiently Active (06/28/2023)   Exercise Vital Sign    Days of Exercise per Week: 3 days    Minutes of Exercise per Session: 40 min  Stress: No Stress Concern Present (12/20/2022)   [employer] of Occupational Health - Occupational Stress Questionnaire    Feeling of Stress : Not at all  Social Connections: Moderately Integrated (06/28/2023)   Social Connection and Isolation Panel [NHANES]    Frequency of Communication with Friends and Family: Three times a week    Frequency of Social Gatherings with Friends and Family: More than three  times a week    Attends Religious Services: More than 4 times per year    Active Member of Clubs or Organizations: Yes    Attends Banker Meetings: More than 4 times per year    Marital Status: Widowed  Intimate Partner Violence: Not At Risk (12/20/2022)   Humiliation, Afraid, Rape, and Kick questionnaire    Fear of Current or Ex-Partner: No    Emotionally Abused: No    Physically Abused: No    Sexually Abused: No    Review of Systems: See HPI, otherwise negative ROS  Physical Exam: BP 138/60   Pulse 67   Temp 98.2 F (36.8 C) (Temporal)   Resp 14   Ht 5\' 5"  (1.651 m)   Wt 51.9 kg   SpO2 100%   BMI 19.05 kg/m  General:   Alert,  pleasant and cooperative in NAD Head:  Normocephalic and atraumatic. Lungs:  Clear to auscultation.    Heart:  Regular rate and rhythm.   Impression/Plan: Brandi Ramos is here for ophthalmic surgery.  Risks, benefits, limitations, and alternatives regarding ophthalmic surgery have been reviewed with the patient.  Questions have been answered.  All parties agreeable.   Lockie Mola, MD  10/09/2023, 8:54 AM

## 2023-10-10 ENCOUNTER — Encounter: Payer: Self-pay | Admitting: Ophthalmology

## 2023-10-15 DIAGNOSIS — K5909 Other constipation: Secondary | ICD-10-CM | POA: Diagnosis not present

## 2023-10-15 DIAGNOSIS — Z860101 Personal history of adenomatous and serrated colon polyps: Secondary | ICD-10-CM | POA: Diagnosis not present

## 2023-10-15 DIAGNOSIS — K219 Gastro-esophageal reflux disease without esophagitis: Secondary | ICD-10-CM | POA: Diagnosis not present

## 2023-11-01 ENCOUNTER — Ambulatory Visit: Payer: Medicare Other | Admitting: Internal Medicine

## 2023-11-01 VITALS — BP 109/70 | HR 79 | Ht 65.0 in | Wt 115.0 lb

## 2023-11-01 DIAGNOSIS — D696 Thrombocytopenia, unspecified: Secondary | ICD-10-CM | POA: Diagnosis not present

## 2023-11-01 DIAGNOSIS — I4949 Other premature depolarization: Secondary | ICD-10-CM | POA: Diagnosis not present

## 2023-11-01 DIAGNOSIS — K219 Gastro-esophageal reflux disease without esophagitis: Secondary | ICD-10-CM

## 2023-11-01 DIAGNOSIS — D61818 Other pancytopenia: Secondary | ICD-10-CM

## 2023-11-01 DIAGNOSIS — R319 Hematuria, unspecified: Secondary | ICD-10-CM

## 2023-11-01 DIAGNOSIS — E7800 Pure hypercholesterolemia, unspecified: Secondary | ICD-10-CM | POA: Diagnosis not present

## 2023-11-01 DIAGNOSIS — I700 Atherosclerosis of aorta: Secondary | ICD-10-CM

## 2023-11-01 DIAGNOSIS — F320 Major depressive disorder, single episode, mild: Secondary | ICD-10-CM

## 2023-11-01 DIAGNOSIS — K2960 Other gastritis without bleeding: Secondary | ICD-10-CM | POA: Diagnosis not present

## 2023-11-01 DIAGNOSIS — K5900 Constipation, unspecified: Secondary | ICD-10-CM

## 2023-11-01 DIAGNOSIS — G43709 Chronic migraine without aura, not intractable, without status migrainosus: Secondary | ICD-10-CM

## 2023-11-01 DIAGNOSIS — M542 Cervicalgia: Secondary | ICD-10-CM

## 2023-11-01 LAB — CBC WITH DIFFERENTIAL/PLATELET
Basophils Absolute: 0.1 10*3/uL (ref 0.0–0.1)
Basophils Relative: 1.8 % (ref 0.0–3.0)
Eosinophils Absolute: 0.2 10*3/uL (ref 0.0–0.7)
Eosinophils Relative: 4.8 % (ref 0.0–5.0)
HCT: 37.3 % (ref 36.0–46.0)
Hemoglobin: 12.4 g/dL (ref 12.0–15.0)
Lymphocytes Relative: 33.8 % (ref 12.0–46.0)
Lymphs Abs: 1.2 10*3/uL (ref 0.7–4.0)
MCHC: 33.3 g/dL (ref 30.0–36.0)
MCV: 90 fL (ref 78.0–100.0)
Monocytes Absolute: 0.3 10*3/uL (ref 0.1–1.0)
Monocytes Relative: 8.8 % (ref 3.0–12.0)
Neutro Abs: 1.8 10*3/uL (ref 1.4–7.7)
Neutrophils Relative %: 50.8 % (ref 43.0–77.0)
Platelets: 143 10*3/uL — ABNORMAL LOW (ref 150.0–400.0)
RBC: 4.15 Mil/uL (ref 3.87–5.11)
RDW: 13.2 % (ref 11.5–15.5)
WBC: 3.6 10*3/uL — ABNORMAL LOW (ref 4.0–10.5)

## 2023-11-01 LAB — BASIC METABOLIC PANEL
BUN: 16 mg/dL (ref 6–23)
CO2: 32 meq/L (ref 19–32)
Calcium: 9 mg/dL (ref 8.4–10.5)
Chloride: 106 meq/L (ref 96–112)
Creatinine, Ser: 0.93 mg/dL (ref 0.40–1.20)
GFR: 60.2 mL/min (ref >60.00)
Glucose, Bld: 99 mg/dL (ref 70–99)
Potassium: 4.3 meq/L (ref 3.5–5.1)
Sodium: 141 meq/L (ref 135–145)

## 2023-11-01 LAB — HEPATIC FUNCTION PANEL
ALT: 15 U/L (ref 0–35)
AST: 26 U/L (ref 0–37)
Albumin: 3.9 g/dL (ref 3.5–5.2)
Alkaline Phosphatase: 40 U/L (ref 39–117)
Bilirubin, Direct: 0.1 mg/dL (ref 0.0–0.3)
Total Bilirubin: 0.5 mg/dL (ref 0.2–1.2)
Total Protein: 6.4 g/dL (ref 6.0–8.3)

## 2023-11-01 LAB — LIPID PANEL
Cholesterol: 137 mg/dL (ref 0–200)
HDL: 54 mg/dL (ref >39.00)
LDL Cholesterol: 67 mg/dL (ref 0–99)
NonHDL: 82.75
Total CHOL/HDL Ratio: 3
Triglycerides: 81 mg/dL (ref 0.0–149.0)
VLDL: 16.2 mg/dL (ref 0.0–40.0)

## 2023-11-01 MED ORDER — MAGNESIUM OXIDE -MG SUPPLEMENT 400 (240 MG) MG PO TABS: 1.0000 | ORAL_TABLET | Freq: Every day | ORAL | 3 refills | Status: AC

## 2023-11-01 MED ORDER — DULOXETINE HCL 60 MG PO CPEP: 60.0000 mg | ORAL_CAPSULE | Freq: Every day | ORAL | 1 refills | Status: DC

## 2023-11-01 MED ORDER — RABEPRAZOLE SODIUM 20 MG PO TBEC: 20.0000 mg | DELAYED_RELEASE_TABLET | Freq: Two times a day (BID) | ORAL | 1 refills | Status: DC

## 2023-11-01 MED ORDER — SUCRALFATE 1 G PO TABS: 1.0000 g | ORAL_TABLET | Freq: Two times a day (BID) | ORAL | 1 refills | Status: DC | PRN

## 2023-11-01 NOTE — Progress Notes (Signed)
 Subjective:    Patient ID: Brandi Ramos, female    DOB: [birth year], 77 y.o.   MRN: [id number]  Patient here for  Chief Complaint  Patient presents with   Medical Management of Chronic Issues    HPI Here for a scheduled follow up - follow up regarding increased stress/anxiety. Being followed by ortho for neck pain.  S/p C7-T1 ESI - last 05/15/23.  Last injection helped. Planning for  [facility] next week. Also being followed by headache clinic. Flares with neck, but overall headaches are relatively stable. Had f/u with GI 10/15/23 - f/u GERD - better on aciphex and carafate bid. F/u MRI - ok. Planning for colonoscopy. Also recommended increase stool softener to 2/day and increased hydration and add daily psyllium. Colonoscopy scheduled for 04/2024. Increased stress. Discussed. Does not feel needs any further intervention.   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    Past Surgical History:  Procedure Laterality Date   ABDOMINAL HYSTERECTOMY  1984   CATARACT EXTRACTION W/PHACO Right 09/25/2023   Procedure: CATARACT EXTRACTION PHACO AND INTRAOCULAR LENS PLACEMENT (IOC) RIGHT CLAREON VIVITY TORIC  5.25  00:35.3;  Surgeon: Lockie Mola, MD;  Location: [facility];  Service: Ophthalmology;  Laterality: Right;   CATARACT EXTRACTION W/PHACO Left 10/09/2023   Procedure: CATARACT EXTRACTION PHACO AND INTRAOCULAR LENS PLACEMENT (IOC) LEFT  CLAREON VIVITY TORIC;  Surgeon: Lockie Mola, MD;   Location: [facility];  Service: Ophthalmology;  Laterality: Left;  7.50 0:33.4   COLONOSCOPY     COLONOSCOPY WITH PROPOFOL N/A 09/26/2018   Procedure: COLONOSCOPY WITH PROPOFOL;  Surgeon: Christena Deem, MD;  Location: [facility] ENDOSCOPY;  Service: Endoscopy;  Laterality: N/A;   ESOPHAGOGASTRODUODENOSCOPY (EGD) WITH PROPOFOL N/A 10/31/2015   Procedure: ESOPHAGOGASTRODUODENOSCOPY (EGD) WITH PROPOFOL;  Surgeon: Christena Deem, MD;  Location: [facility] ENDOSCOPY;  Service: Endoscopy;  Laterality: N/A;   ESOPHAGOGASTRODUODENOSCOPY (EGD) WITH PROPOFOL N/A 09/26/2018   Procedure: ESOPHAGOGASTRODUODENOSCOPY (EGD) WITH PROPOFOL;  Surgeon: Christena Deem, MD;  Location: [facility] ENDOSCOPY;  Service: Endoscopy;  Laterality: N/A;   LAPAROSCOPIC APPENDECTOMY N/A 10/29/2018   Procedure: APPENDECTOMY LAPAROSCOPIC;  Surgeon: Carolan Shiver, MD;  Location: [facility] ORS;  Service: General;  Laterality: N/A;   LAPAROSCOPIC BILATERAL SALPINGO OOPHERECTOMY Bilateral 10/29/2018   Procedure: LAPAROSCOPIC BILATERAL SALPINGO OOPHORECTOMY;  Surgeon: Ward, Elenora Fender, MD;  Location: [facility] ORS;  Service: Gynecology;  Laterality: Bilateral;   Family History  Problem Relation Age of Onset   CAD Mother        s/p CABG   Hypercholesterolemia Mother    Breast cancer Mother 2   Heart disease Mother    Lung cancer Mother    Hypertension Father    Migraines Father    Hypercholesterolemia Father    Lung cancer Father    Migraines Sister    Arthritis Sister    Lymphoma  Other        grandmother   Colon cancer Neg Hx    Social History   Socioeconomic History   Marital status: Widowed    Spouse name: Not on file   Number of children: 2   Years of education: Not on file   Highest education level: 12th grade  Occupational History   Not on file  Tobacco Use   Smoking status: Never   Smokeless tobacco: Never  Vaping Use   Vaping status: Never Used  Substance and Sexual Activity   Alcohol use: Yes     Alcohol/week: 0.0 standard drinks of alcohol    Comment: wine occassionally   Drug use: No   Sexual activity: Not Currently  Other Topics Concern   Not on file  Social History Narrative   Not on file   Social Drivers of Health   Financial Resource Strain: Low Risk  (10/31/2023)   Overall Financial Resource Strain (CARDIA)    Difficulty of Paying Living Expenses: Not hard at all  Food Insecurity: No Food Insecurity (10/31/2023)   Hunger Vital Sign    Worried About Running Out of Food in the Last Year: Never true    Ran Out of Food in the Last Year: Never true  Transportation Needs: No Transportation Needs (10/31/2023)   PRAPARE - Administrator, Civil Service (Medical): No    Lack of Transportation (Non-Medical): No  Physical Activity: Insufficiently Active (10/31/2023)   Exercise Vital Sign    Days of Exercise per Week: 2 days    Minutes of Exercise per Session: 20 min  Stress: No Stress Concern Present (10/31/2023)   [employer] of Occupational Health - Occupational Stress Questionnaire    Feeling of Stress : Only a little  Social Connections: Moderately Integrated (10/31/2023)   Social Connection and Isolation Panel [NHANES]    Frequency of Communication with Friends and Family: More than three times a week    Frequency of Social Gatherings with Friends and Family: Once a week    Attends Religious Services: More than 4 times per year    Active Member of [employer] or Organizations: Yes    Attends Banker Meetings: More than 4 times per year    Marital Status: Widowed     Review of Systems  Constitutional:  Negative for appetite change and unexpected weight change.  HENT:  Negative for congestion and sinus pressure.   Respiratory:  Negative for cough, chest tightness and shortness of breath.   Cardiovascular:  Negative for chest pain and palpitations.  Gastrointestinal:  Negative for abdominal pain, nausea and vomiting.  Genitourinary:  Negative for  difficulty urinating and dysuria.  Musculoskeletal:  Negative for joint swelling and myalgias.  Neurological:  Negative for dizziness and headaches.  Psychiatric/Behavioral:  Negative for agitation and dysphoric mood.        Objective:     BP 109/70   Pulse 79   Ht 5\' 5"  (1.651 m)   Wt 115 lb (52.2 kg)   SpO2 98%   BMI 19.14 kg/m  Wt Readings from Last 3 Encounters:  11/04/23 115 lb (52.2 kg)  10/09/23 114 lb 8 oz (51.9 kg)  09/25/23 115 lb 4.8 oz (52.3 kg)    Physical Exam Vitals reviewed.  Constitutional:      General: She is not in acute distress.    Appearance: Normal appearance.  HENT:     Head: Normocephalic and atraumatic.     Right Ear: External  ear normal.     Left Ear: External ear normal.     Mouth/Throat:     Pharynx: No oropharyngeal exudate or posterior oropharyngeal erythema.  Eyes:     General: No scleral icterus.       Right eye: No discharge.        Left eye: No discharge.     Conjunctiva/sclera: Conjunctivae normal.  Neck:     Thyroid: No thyromegaly.  Cardiovascular:     Rate and Rhythm: Normal rate and regular rhythm.  Pulmonary:     Effort: No respiratory distress.     Breath sounds: Normal breath sounds. No wheezing.  Abdominal:     General: Bowel sounds are normal.     Palpations: Abdomen is soft.     Tenderness: There is no abdominal tenderness.  Musculoskeletal:        General: No swelling or tenderness.     Cervical back: Neck supple. No tenderness.  Lymphadenopathy:     Cervical: No cervical adenopathy.  Skin:    Findings: No erythema or rash.  Neurological:     Mental Status: She is alert.  Psychiatric:        Mood and Affect: Mood normal.        Behavior: Behavior normal.         Outpatient Encounter Medications as of 11/01/2023  Medication Sig   [DISCONTINUED] magnesium oxide (MAG-OX) 400 (240 Mg) MG tablet Take 1 tablet by mouth daily.   ALPRAZolam (NIRAVAM) 0.25 MG dissolvable tablet Take 0.25 mg by mouth as needed  for anxiety.   DULoxetine (CYMBALTA) 60 MG capsule Take 1 capsule (60 mg total) by mouth daily.   Fe Fum-FePoly-Vit C-Vit B3 (INTEGRA) 62.5-62.5-40-3 MG CAPS Take 1 capsule by mouth daily.   fexofenadine (ALLEGRA) 180 MG tablet Take 180 mg by mouth daily as needed for allergies or rhinitis.   fluticasone (FLONASE) 50 MCG/ACT nasal spray Place 2 sprays into both nostrils daily.   magnesium oxide (MAG-OX) 400 (240 Mg) MG tablet Take 1 tablet (400 mg total) by mouth daily.   ondansetron (ZOFRAN ODT) 4 MG disintegrating tablet Take 1 tablet (4 mg total) by mouth 2 (two) times daily as needed for nausea or vomiting.   propranolol ER (INDERAL LA) 60 MG 24 hr capsule TK 1 C PO D   PSEUDOEPHEDRINE HCL PO Take by mouth as needed.   RABEprazole (ACIPHEX) 20 MG tablet Take 1 tablet (20 mg total) by mouth 2 (two) times daily before a meal.   rizatriptan (MAXALT) 10 MG tablet Take 10 mg by mouth as needed. May repeat in 2 hours if needed   rosuvastatin (CRESTOR) 5 MG tablet Take 1 tablet (5 mg total) by mouth daily.   sucralfate (CARAFATE) 1 g tablet Take 1 tablet (1 g total) by mouth 2 (two) times daily as needed.   SUMAtriptan (IMITREX) 100 MG tablet    topiramate (TOPAMAX) 50 MG tablet TAKE 1 AND 1/2 TABLET BY MOUTH IN MORNING AND 1 TABLET BY MOUTH IN EVENING   traZODone (DESYREL) 50 MG tablet Take 50 mg by mouth at bedtime as needed for sleep.   [DISCONTINUED] DULoxetine (CYMBALTA) 60 MG capsule Take 1 capsule (60 mg total) by mouth daily.   [DISCONTINUED] Magnesium Oxide, Elemental, 400 MG TABS Take one tablet per day   [DISCONTINUED] RABEprazole (ACIPHEX) 20 MG tablet Take 1 tablet (20 mg total) by mouth 2 (two) times daily before a meal.   [DISCONTINUED] sucralfate (CARAFATE) 1 g tablet TAKE  1 TABLET(1 GRAM) BY MOUTH TWICE DAILY AS NEEDED   No facility-administered encounter medications on file as of 11/01/2023.     Lab Results  Component Value Date   WBC 3.6 (L) 11/01/2023   HGB 12.4 11/01/2023    HCT 37.3 11/01/2023   PLT 143.0 (L) 11/01/2023   GLUCOSE 99 11/01/2023   CHOL 137 11/01/2023   TRIG 81.0 11/01/2023   HDL 54.00 11/01/2023   LDLCALC 67 11/01/2023   ALT 15 11/01/2023   AST 26 11/01/2023   NA 141 11/01/2023   K 4.3 11/01/2023   CL 106 11/01/2023   CREATININE 0.93 11/01/2023   BUN 16 11/01/2023   CO2 32 11/01/2023   TSH 2.15 07/01/2023   HGBA1C 5.5 01/15/2018       Assessment & Plan:  Thrombocytopenia (HCC) Assessment & Plan: Have discussed with hematology. Recheck cbc.   Orders: -     CBC with Differential/Platelet  Pancytopenia (HCC) Assessment & Plan: Discussed with hematology. Follow cbc.   Orders: -     CBC with Differential/Platelet  Hypercholesterolemia Assessment & Plan: Continue crestor. Low cholesterol diet and exercise. Follow lipid panel and liver function tests.   Orders: -     Basic metabolic panel -     Hepatic function panel -     Lipid panel  Gastritis, bile acid reflux -     RABEprazole Sodium; Take 1 tablet (20 mg total) by mouth 2 (two) times daily before a meal.  Dispense: 180 tablet; Refill: 1  Premature beats Assessment & Plan: Noted on exam. EKG - SR occasional PAC. Currently without symptoms. Follow.   Orders: -     EKG 12-Lead  Neck pain Assessment & Plan: Being followed by ortho for neck pain.  S/p C7-T1 ESI - last 05/15/23.  Last injection helped. Planning for [facility] next week.    Chronic migraine without aura without status migrainosus, not intractable Assessment & Plan: Followed by neurology. Stable. Neck does aggravate. Planning for [facility] next week. Continue f/u with [facility] - for treatment.    Major depressive disorder, single episode, mild (HCC) Assessment & Plan: Continue trazodone and cymbalta. Continue to follow up with psychiatry. Stable. Follow. Does not feel needs any further intervention.    Hematuria, unspecified type Assessment & Plan: Being followed by urology. .  Evaluated 04/10/23. Being followed  for kidney stone.  Work up in 2019 NED -no reports of gross heme -UA negative for micro heme Recommend return in 2 years (around 03/2025) for KUB, UA and OAB.    Gastroesophageal reflux disease, unspecified whether esophagitis present Assessment & Plan: Continue on aciphex. Trial carafate. Symptoms controlled. Follow.    Aortic atherosclerosis (HCC) Assessment & Plan: Continue crestor.    Constipation, unspecified constipation type Assessment & Plan: Has seen GI. Recommendations as outlined.    Other orders -     DULoxetine HCl; Take 1 capsule (60 mg total) by mouth daily.  Dispense: 90 capsule; Refill: 1 -     Magnesium Oxide -Mg Supplement; Take 1 tablet (400 mg total) by mouth daily.  Dispense: 90 tablet; Refill: 3 -     Sucralfate; Take 1 tablet (1 g total) by mouth 2 (two) times daily as needed.  Dispense: 180 tablet; Refill: 1     Dale Elbert, MD

## 2023-11-04 ENCOUNTER — Encounter: Payer: Self-pay | Admitting: Internal Medicine

## 2023-11-04 NOTE — Assessment & Plan Note (Signed)
 Being followed by urology. .  Evaluated 04/10/23. Being followed for kidney stone.  Work up in 2019 NED -no reports of gross heme -UA negative for micro heme Recommend return in 2 years (around 03/2025) for KUB, UA and OAB.

## 2023-11-04 NOTE — Assessment & Plan Note (Signed)
 Continue trazodone and cymbalta. Continue to follow up with psychiatry. Stable. Follow. Does not feel needs any further intervention.

## 2023-11-04 NOTE — Assessment & Plan Note (Signed)
 Noted on exam. EKG - SR occasional PAC. Currently without symptoms. Follow.

## 2023-11-04 NOTE — Assessment & Plan Note (Signed)
 Being followed by ortho for neck pain.  S/p C7-T1 ESI - last 05/15/23.  Last injection helped. Planning for [facility] next week.

## 2023-11-04 NOTE — Assessment & Plan Note (Signed)
 Continue crestor.  Low cholesterol diet and exercise.  Follow lipid panel and liver function tests.

## 2023-11-04 NOTE — Assessment & Plan Note (Signed)
 Followed by neurology. Stable. Neck does aggravate. Planning for [facility] next week. Continue f/u with [facility] - for treatment.

## 2023-11-04 NOTE — Telephone Encounter (Signed)
 Please confirm dose taking. If taking aciphex 20mg  q day, have her start protonix 40mg  q day. Follow.  Notify me if acid reflux, etc.

## 2023-11-04 NOTE — Assessment & Plan Note (Signed)
 Discussed with hematology. Follow cbc.

## 2023-11-04 NOTE — Assessment & Plan Note (Signed)
 Has seen GI. Recommendations as outlined.

## 2023-11-04 NOTE — Assessment & Plan Note (Signed)
 Continue on aciphex. Trial carafate. Symptoms controlled. Follow.

## 2023-11-04 NOTE — Assessment & Plan Note (Signed)
 Have discussed with hematology. Recheck cbc.

## 2023-11-04 NOTE — Assessment & Plan Note (Signed)
 Continue crestor

## 2023-11-05 MED ORDER — PANTOPRAZOLE SODIUM 40 MG PO TBEC: 40.0000 mg | DELAYED_RELEASE_TABLET | Freq: Every day | ORAL | 1 refills | Status: DC

## 2023-11-05 NOTE — Telephone Encounter (Signed)
 Confirmed pt taking aciphex 20 mg q day. Rx sent in for protonix.

## 2023-11-06 DIAGNOSIS — M5412 Radiculopathy, cervical region: Secondary | ICD-10-CM | POA: Diagnosis not present

## 2023-11-28 DIAGNOSIS — M47812 Spondylosis without myelopathy or radiculopathy, cervical region: Secondary | ICD-10-CM | POA: Diagnosis not present

## 2023-11-28 DIAGNOSIS — M791 Myalgia, unspecified site: Secondary | ICD-10-CM | POA: Diagnosis not present

## 2023-11-28 DIAGNOSIS — M5030 Other cervical disc degeneration, unspecified cervical region: Secondary | ICD-10-CM | POA: Diagnosis not present

## 2023-11-28 DIAGNOSIS — K080 Exfoliation of teeth due to systemic causes: Secondary | ICD-10-CM | POA: Diagnosis not present

## 2023-12-05 DIAGNOSIS — G4486 Cervicogenic headache: Secondary | ICD-10-CM | POA: Diagnosis not present

## 2023-12-05 DIAGNOSIS — M47812 Spondylosis without myelopathy or radiculopathy, cervical region: Secondary | ICD-10-CM | POA: Diagnosis not present

## 2023-12-05 DIAGNOSIS — R52 Pain, unspecified: Secondary | ICD-10-CM | POA: Diagnosis not present

## 2023-12-05 DIAGNOSIS — G4701 Insomnia due to medical condition: Secondary | ICD-10-CM | POA: Diagnosis not present

## 2023-12-05 DIAGNOSIS — G43719 Chronic migraine without aura, intractable, without status migrainosus: Secondary | ICD-10-CM | POA: Diagnosis not present

## 2023-12-20 DIAGNOSIS — F411 Generalized anxiety disorder: Secondary | ICD-10-CM | POA: Diagnosis not present

## 2023-12-20 DIAGNOSIS — F410 Panic disorder [episodic paroxysmal anxiety] without agoraphobia: Secondary | ICD-10-CM | POA: Diagnosis not present

## 2023-12-20 DIAGNOSIS — F3341 Major depressive disorder, recurrent, in partial remission: Secondary | ICD-10-CM | POA: Diagnosis not present

## 2024-01-03 ENCOUNTER — Ambulatory Visit: Payer: Medicare Other

## 2024-01-10 ENCOUNTER — Ambulatory Visit: Admitting: Urology

## 2024-01-10 VITALS — BP 108/65 | HR 73 | Ht 65.0 in | Wt 116.0 lb

## 2024-01-10 DIAGNOSIS — R3129 Other microscopic hematuria: Secondary | ICD-10-CM

## 2024-01-10 DIAGNOSIS — R300 Dysuria: Secondary | ICD-10-CM

## 2024-01-10 LAB — MICROSCOPIC EXAMINATION

## 2024-01-10 LAB — URINALYSIS, COMPLETE
Bilirubin, UA: NEGATIVE
Glucose, UA: NEGATIVE
Ketones, UA: NEGATIVE
Nitrite, UA: NEGATIVE
Specific Gravity, UA: 1.015 (ref 1.005–1.030)
Urobilinogen, Ur: 0.2 mg/dL (ref 0.2–1.0)
pH, UA: 6.5 (ref 5.0–7.5)

## 2024-01-10 MED ORDER — NITROFURANTOIN MONOHYD MACRO 100 MG PO CAPS: 100.0000 mg | ORAL_CAPSULE | Freq: Two times a day (BID) | ORAL | 0 refills | Status: DC

## 2024-01-10 NOTE — Progress Notes (Unsigned)
 01/10/2024 2:05 PM   Brandi Ramos 1946/01/07 [id number]  Referring provider: Dellar Fenton, MD [address] [place],  [place] [address]  Urological history: 1.  High risk hematuria - Non-smoker - CTU 01/2018 Nonobstructive right nephrolithiasis includes a 3 mm upper pole calculus on image 73/5; an 8 mm in long axis lower pole calculus on image 63/5; and a 4 mm lower pole calculus on image 66/5.  Left-sided renal calculi include a 1-2 mm upper pole nonobstructive calculus and a 1-2 mm lower pole nonobstructive calculus. There is also faint calcification along the left renal capsule on image 60/5 medially.  Some cortical hypodensity in thinning in the left mid upper kidney adjacent to the spleen. Given the slightly scalloped contour I favor that this is probably from remote prior scarring, and a similar appearance was present on 11/16/2015 - Cysto 02/2018 with Dr. Ace Holder was negative -Contrast CT in 10/2018 Nonobstructing calculus in the lower pole of the right kidney measuring up to 7 mm on image 47/4. There are probable additional tiny renal calculi -Cystoscopy with Dr. Cherylene Corrente in 11/2019 was NED - MR abdomen (04/2023) NED   2. Nephrolithiasis - Contrast CT in 10/2018 noted there is a nonobstructing calculus in the lower pole of the right kidney measuring up to 7 mm on image 47/4. There are probable additional tiny renal calculi - Contrast CT in 10/2020 7 mm stone noted within the inferior pole of the right kidney. - KUB, 02/2021 - stable right renal calculi 8 mm  -KUB (04/2022) -2 small calcific densities overlying the right kidney suggesting right renal stones -Abdominal US  (03/2023) -possible nonobstructing calculi are seen in both kidneys  3. OAB  Chief Complaint  Patient presents with   Dysuria   HPI: Brandi Ramos is a 78 y.o. woman who presents today for possible UTI.  Previous records reviewed.   Starting 2 to 3 days ago she started to  experience dysuria, urgency, frequency and urge incontinence.  Patient denies any modifying or aggravating factors.  Patient denies any recent UTI's, gross hematuria, dysuria or suprapubic/flank pain.  Patient denies any fevers, chills, nausea or vomiting.    UA yellow clear, specific area 1.015, pH 6.5, trace protein, 1+ heme, 1+ leukocyte, 6-10 WBCs, 11-30 RBCs, 0-10 epithelial cells and a few bacteria.  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     Surgical History: Past Surgical History:  Procedure Laterality Date   ABDOMINAL HYSTERECTOMY  1984   CATARACT EXTRACTION W/PHACO Right 09/25/2023   Procedure: CATARACT EXTRACTION PHACO AND INTRAOCULAR LENS PLACEMENT (IOC) RIGHT CLAREON VIVITY TORIC  5.25  00:35.3;  Surgeon: Annell Kidney, MD;  Location: [facility];  Service: Ophthalmology;  Laterality: Right;   CATARACT EXTRACTION W/PHACO Left 10/09/2023   Procedure: CATARACT EXTRACTION PHACO AND INTRAOCULAR LENS PLACEMENT (IOC)  LEFT  CLAREON Thurston Flow;  Surgeon: Annell Kidney, MD;  Location: [facility];  Service: Ophthalmology;  Laterality: Left;  7.50 0:33.4   COLONOSCOPY     COLONOSCOPY WITH PROPOFOL  N/A 09/26/2018   Procedure: COLONOSCOPY WITH PROPOFOL ;  Surgeon: Deveron Fly, MD;  Location[facility];  Service: Endoscopy;  Laterality: N/A;   ESOPHAGOGASTRODUODENOSCOPY (EGD) WITH PROPOFOL  N/A 10/31/2015   Procedure: ESOPHAGOGASTRODUODENOSCOPY (EGD) WITH  PROPOFOL ;  Surgeon: Deveron Fly, MD;  Location: [facility] ENDOSCOPY;  Service: Endoscopy;  Laterality: N/A;   ESOPHAGOGASTRODUODENOSCOPY (EGD) WITH PROPOFOL  N/A 09/26/2018   Procedure: ESOPHAGOGASTRODUODENOSCOPY (EGD) WITH PROPOFOL ;  Surgeon: Deveron Fly, MD;  Location: [facility] ENDOSCOPY;  Service: Endoscopy;  Laterality: N/A;   LAPAROSCOPIC APPENDECTOMY N/A 10/29/2018   Procedure: APPENDECTOMY LAPAROSCOPIC;  Surgeon: Eldred Grego, MD;  Location: [facility];  Service: General;  Laterality: N/A;   LAPAROSCOPIC BILATERAL SALPINGO OOPHERECTOMY Bilateral 10/29/2018   Procedure: LAPAROSCOPIC BILATERAL SALPINGO OOPHORECTOMY;  Surgeon: Ward, Margarie Shay, MD;  Location: [facility];  Service: Gynecology;  Laterality: Bilateral;    Home Medications:  Allergies as of 01/10/2024       Reactions   Amitiza [lubiprostone] Nausea Only   Sulfa Antibiotics    GI discomfort        Medication List        Accurate as of Jan 10, 2024 11:59 PM. If you have any questions, ask your nurse or doctor.          ALPRAZolam  0.25 MG dissolvable tablet Commonly known as: NIRAVAM  Take 0.25 mg by mouth as needed for anxiety.   DULoxetine  60 MG capsule Commonly known as: CYMBALTA  Take 1 capsule (60 mg total) by mouth daily.   fexofenadine 180 MG tablet Commonly known as: ALLEGRA Take 180 mg by mouth daily as needed for allergies or rhinitis.   fluticasone  50 MCG/ACT nasal spray Commonly known as: FLONASE  Place 2 sprays into both nostrils daily.   Integra 62.5-62.5-40-3 MG Caps Take 1 capsule by mouth daily.   magnesium  oxide 400 (240 Mg) MG tablet Commonly known as: MAG-OX Take 1 tablet (400 mg total) by mouth daily.   nitrofurantoin  (macrocrystal-monohydrate) 100 MG capsule Commonly known as: MACROBID  Take 1 capsule (100 mg total) by mouth every 12 (twelve) hours.   ondansetron  4 MG disintegrating tablet Commonly known as: Zofran  ODT Take 1 tablet (4 mg total) by mouth 2 (two) times daily  as needed for nausea or vomiting.   pantoprazole  40 MG tablet Commonly known as: PROTONIX  Take 1 tablet (40 mg total) by mouth daily.   propranolol  ER 60 MG 24 hr capsule Commonly known as: INDERAL  LA TK 1 C PO D   PSEUDOEPHEDRINE HCL PO Take by mouth as needed.   rizatriptan 10 MG tablet Commonly known as: MAXALT Take 10 mg by mouth as needed. May repeat in 2 hours if needed   rosuvastatin  5 MG tablet Commonly known as: CRESTOR  Take 1 tablet (5 mg total) by mouth daily.   sucralfate  1 g tablet Commonly known as: CARAFATE  Take 1 tablet (1 g total) by mouth 2 (two) times daily as needed.   SUMAtriptan 100 MG tablet Commonly known as: IMITREX   topiramate  50 MG tablet Commonly known as: TOPAMAX  TAKE 1 AND 1/2 TABLET BY MOUTH IN MORNING AND 1 TABLET BY MOUTH IN EVENING   traZODone 100 MG tablet Commonly known as: DESYREL Take 100 mg by mouth at bedtime. What changed: Another medication with the same name was removed. Continue taking this medication,  and follow the directions you see here.        Allergies:  Allergies  Allergen Reactions   Amitiza [Lubiprostone] Nausea Only   Sulfa Antibiotics     GI discomfort    Family History: Family History  Problem Relation Age of Onset   CAD Mother        s/p CABG   Hypercholesterolemia Mother    Breast cancer Mother 15   Heart disease Mother    Lung cancer Mother    Hypertension Father    Migraines Father    Hypercholesterolemia Father    Lung cancer Father    Migraines Sister    Arthritis Sister    Lymphoma Other        grandmother   Colon cancer Neg Hx     Social History:  reports that she has never smoked. She has never used smokeless tobacco. She reports current alcohol use. She reports that she does not use drugs.  ROS: Pertinent ROS in HPI  Physical Exam: BP 108/65   Pulse 73   Ht 5\' 5"  (1.651 m)   Wt 116 lb (52.6 kg)   BMI 19.30 kg/m   Constitutional:  Well nourished. Alert and oriented, No  acute distress. HEENT: [place] AT, moist mucus membranes.  Trachea midline Cardiovascular: No clubbing, cyanosis, or edema. Respiratory: Normal respiratory effort, no increased work of breathing. Neurologic: Grossly intact, no focal deficits, moving all 4 extremities. Psychiatric: Normal mood and affect.    Laboratory Data: Lab Results  Component Value Date   WBC 3.6 (L) 11/01/2023   HGB 12.4 11/01/2023   HCT 37.3 11/01/2023   MCV 90.0 11/01/2023   PLT 143.0 (L) 11/01/2023    Lab Results  Component Value Date   CREATININE 0.93 11/01/2023       Component Value Date/Time   CHOL 137 11/01/2023 0924   HDL 54.00 11/01/2023 0924   CHOLHDL 3 11/01/2023 0924   VLDL 16.2 11/01/2023 0924   LDLCALC 67 11/01/2023 0924    Lab Results  Component Value Date   AST 26 11/01/2023   Lab Results  Component Value Date   ALT 15 11/01/2023   Urinalysis See EPIC and HPI  I have reviewed the labs.   Pertinent Imaging: N/A  Assessment & Plan:    1. Dysuria (Primary) - Urinalysis, Complete, grossly infected - CULTURE, URINE COMPREHENSIVE - Prescribed Macrobid  100 mg twice daily until culture results are available and will adjust as necessary  2.  Microscopic hematuria - Likely secondary to urinary tract infection - Will have her return in 2 months for repeat UA and symptom recheck   Return in about 2 months (around 03/11/2024) for UA and symptom recheck .  These notes generated with voice recognition software. I apologize for typographical errors.  Briant Camper  [facility] [address]  [address] [place], [place] [address] [phone]

## 2024-01-12 ENCOUNTER — Encounter: Payer: Self-pay | Admitting: Internal Medicine

## 2024-01-13 ENCOUNTER — Encounter: Payer: Self-pay | Admitting: Urology

## 2024-01-13 ENCOUNTER — Other Ambulatory Visit: Payer: Self-pay

## 2024-01-13 MED ORDER — INTEGRA 62.5-62.5-40-3 MG PO CAPS: 1.0000 | ORAL_CAPSULE | Freq: Every day | ORAL | 3 refills | Status: AC

## 2024-01-14 LAB — CULTURE, URINE COMPREHENSIVE

## 2024-01-24 DIAGNOSIS — Z961 Presence of intraocular lens: Secondary | ICD-10-CM | POA: Diagnosis not present

## 2024-01-24 DIAGNOSIS — D3131 Benign neoplasm of right choroid: Secondary | ICD-10-CM | POA: Diagnosis not present

## 2024-01-24 DIAGNOSIS — H04123 Dry eye syndrome of bilateral lacrimal glands: Secondary | ICD-10-CM | POA: Diagnosis not present

## 2024-01-24 DIAGNOSIS — H40003 Preglaucoma, unspecified, bilateral: Secondary | ICD-10-CM | POA: Diagnosis not present

## 2024-01-29 ENCOUNTER — Ambulatory Visit (INDEPENDENT_AMBULATORY_CARE_PROVIDER_SITE_OTHER): Admitting: *Deleted

## 2024-01-29 VITALS — Ht 65.0 in | Wt 117.0 lb

## 2024-01-29 DIAGNOSIS — Z0000 Encounter for general adult medical examination without abnormal findings: Secondary | ICD-10-CM

## 2024-01-29 NOTE — Progress Notes (Signed)
 Subjective:   Brandi Ramos is a 78 y.o. who presents for a Medicare Wellness preventive visit.  As a reminder, Annual Wellness Visits don't include a physical exam, and some assessments may be limited, especially if this visit is performed virtually. We may recommend an in-person follow-up visit with your provider if needed.  Visit Complete: Virtual I connected with  [name] on 01/29/24 by a audio enabled telemedicine application and verified that I am speaking with the correct person using two identifiers.  Patient Location: Home  Provider Location: Home Office  I discussed the limitations of evaluation and management by telemedicine. The patient expressed understanding and agreed to proceed.  Vital Signs: Because this visit was a virtual/telehealth visit, some criteria may be missing or patient reported. Any vitals not documented were not able to be obtained and vitals that have been documented are patient reported.  VideoDeclined- This patient declined Librarian, academic. Therefore the visit was completed with audio only.  Persons Participating in Visit: Patient.  AWV Questionnaire: No: Patient Medicare AWV questionnaire was not completed prior to this visit.  Cardiac Risk Factors include: advanced age (>30men, >27 women);dyslipidemia     Objective:     Today's Vitals   01/29/24 1504  Weight: 117 lb (53.1 kg)  Height: 5\' 5"  (1.651 m)   Body mass index is 19.47 kg/m.     01/29/2024    3:19 PM 10/09/2023    8:30 AM 09/25/2023    8:14 AM 12/20/2022    8:33 AM 12/06/2021    1:40 PM 04/26/2020    2:16 PM 11/28/2019    1:11 PM  Advanced Directives  Does Patient Have a Medical Advance Directive? No No No No No No No  Would patient like information on creating a medical advance directive? No - Patient declined No - Patient declined No - Patient declined No - Patient declined No - Patient declined No - Patient declined     Current  Medications (verified) Outpatient Encounter Medications as of 01/29/2024  Medication Sig   ALPRAZolam  (NIRAVAM ) 0.25 MG dissolvable tablet Take 0.25 mg by mouth as needed for anxiety.   DULoxetine  (CYMBALTA ) 60 MG capsule Take 1 capsule (60 mg total) by mouth daily.   Fe Fum-FePoly-Vit C-Vit B3 (INTEGRA) 62.5-62.5-40-3 MG CAPS Take 1 capsule by mouth daily.   fexofenadine (ALLEGRA) 180 MG tablet Take 180 mg by mouth daily as needed for allergies or rhinitis.   fluticasone  (FLONASE ) 50 MCG/ACT nasal spray Place 2 sprays into both nostrils daily.   magnesium  oxide (MAG-OX) 400 (240 Mg) MG tablet Take 1 tablet (400 mg total) by mouth daily.   ondansetron  (ZOFRAN  ODT) 4 MG disintegrating tablet Take 1 tablet (4 mg total) by mouth 2 (two) times daily as needed for nausea or vomiting.   pantoprazole  (PROTONIX ) 40 MG tablet Take 1 tablet (40 mg total) by mouth daily.   propranolol  ER (INDERAL  LA) 60 MG 24 hr capsule TK 1 C PO D   PSEUDOEPHEDRINE HCL PO Take by mouth as needed.   rizatriptan (MAXALT) 10 MG tablet Take 10 mg by mouth as needed. May repeat in 2 hours if needed   rosuvastatin  (CRESTOR ) 5 MG tablet Take 1 tablet (5 mg total) by mouth daily.   sucralfate  (CARAFATE ) 1 g tablet Take 1 tablet (1 g total) by mouth 2 (two) times daily as needed.   SUMAtriptan (IMITREX) 100 MG tablet    topiramate  (TOPAMAX ) 50 MG tablet TAKE 1 AND  1/2 TABLET BY MOUTH IN MORNING AND 1 TABLET BY MOUTH IN EVENING   traZODone (DESYREL) 100 MG tablet Take 100 mg by mouth at bedtime.   nitrofurantoin , macrocrystal-monohydrate, (MACROBID ) 100 MG capsule Take 1 capsule (100 mg total) by mouth every 12 (twelve) hours. (Patient not taking: Reported on 01/29/2024)   No facility-administered encounter medications on file as of 01/29/2024.    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    Past Surgical History:  Procedure Laterality Date   ABDOMINAL HYSTERECTOMY  1984   CATARACT EXTRACTION W/PHACO Right 09/25/2023   Procedure: CATARACT EXTRACTION PHACO AND INTRAOCULAR LENS PLACEMENT (IOC) RIGHT CLAREON VIVITY TORIC  5.25  00:35.3;  Surgeon: Annell Kidney, MD;  Location: [facility];  Service: Ophthalmology;  Laterality: Right;   CATARACT EXTRACTION W/PHACO Left 10/09/2023   Procedure: CATARACT EXTRACTION PHACO AND INTRAOCULAR LENS PLACEMENT (IOC) LEFT  CLAREON VIVITY TORIC;  Surgeon: Annell Kidney, MD;  Location: [facility];  Service: Ophthalmology;  Laterality: Left;  7.50 0:33.4   COLONOSCOPY     COLONOSCOPY WITH PROPOFOL  N/A 09/26/2018   Procedure: COLONOSCOPY WITH PROPOFOL ;  Surgeon: Deveron Fly, MD;  Location: [facility] ENDOSCOPY;  Service: Endoscopy;  Laterality: N/A;   ESOPHAGOGASTRODUODENOSCOPY (EGD) WITH PROPOFOL  N/A 10/31/2015   Procedure: ESOPHAGOGASTRODUODENOSCOPY (EGD) WITH PROPOFOL ;  Surgeon: Deveron Fly, MD;  Location: [facility] ENDOSCOPY;  Service: Endoscopy;  Laterality: N/A;   ESOPHAGOGASTRODUODENOSCOPY (EGD) WITH PROPOFOL  N/A 09/26/2018   Procedure: ESOPHAGOGASTRODUODENOSCOPY (EGD) WITH PROPOFOL ;  Surgeon: Deveron Fly, MD;  Location: [facility] ENDOSCOPY;  Service: Endoscopy;  Laterality: N/A;   LAPAROSCOPIC APPENDECTOMY N/A 10/29/2018   Procedure: APPENDECTOMY LAPAROSCOPIC;  Surgeon: Eldred Grego, MD;  Location: [facility];  Service: General;  Laterality: N/A;   LAPAROSCOPIC BILATERAL  SALPINGO OOPHERECTOMY Bilateral 10/29/2018   Procedure: LAPAROSCOPIC BILATERAL SALPINGO OOPHORECTOMY;  Surgeon: Ward, Margarie Shay, MD;  Location: [facility];  Service: Gynecology;  Laterality: Bilateral;   Family History  Problem Relation Age of Onset   CAD Mother        s/p CABG   Hypercholesterolemia Mother    Breast cancer Mother 77   Heart disease Mother    Lung cancer Mother    Hypertension Father    Migraines Father    Hypercholesterolemia Father    Lung cancer Father    Migraines Sister    Arthritis Sister    Lymphoma Other        grandmother   Colon cancer Neg Hx    Social History   Socioeconomic History   Marital status: Widowed    Spouse name: Not on file   Number of children: 2   Years of education: Not on file   Highest education level: 12th grade  Occupational History   Not on file  Tobacco  Use   Smoking status: Never   Smokeless tobacco: Never  Vaping Use   Vaping status: Never Used  Substance and Sexual Activity   Alcohol use: Yes    Alcohol/week: 0.0 standard drinks of alcohol    Comment: wine occassionally   Drug use: No   Sexual activity: Not Currently  Other Topics Concern   Not on file  Social History Narrative   Not on file   Social Drivers of Health   Financial Resource Strain: Low Risk  (01/29/2024)   Overall Financial Resource Strain (CARDIA)    Difficulty of Paying Living Expenses: Not hard at all  Food Insecurity: No Food Insecurity (01/29/2024)   Hunger Vital Sign    Worried About Running Out of Food in the Last Year: Never true    Ran Out of Food in the Last Year: Never true  Transportation Needs: No Transportation Needs (01/29/2024)   PRAPARE - Administrator, Civil Service (Medical): No    Lack of Transportation (Non-Medical): No  Physical Activity: Insufficiently Active (01/29/2024)   Exercise Vital Sign    Days of Exercise per Week: 3 days    Minutes of Exercise per Session: 30 min  Stress: No Stress Concern Present  (01/29/2024)   [employer] of Occupational Health - Occupational Stress Questionnaire    Feeling of Stress : Only a little  Social Connections: Moderately Integrated (01/29/2024)   Social Connection and Isolation Panel [NHANES]    Frequency of Communication with Friends and Family: More than three times a week    Frequency of Social Gatherings with Friends and Family: More than three times a week    Attends Religious Services: More than 4 times per year    Active Member of [employer] or Organizations: Yes    Attends Banker Meetings: More than 4 times per year    Marital Status: Widowed    Tobacco Counseling Counseling given: Not Answered    Clinical Intake:  Pre-visit preparation completed: Yes  Pain : No/denies pain     BMI - recorded: 19.47 Nutritional Status: BMI of 19-24  Normal Nutritional Risks: None Diabetes: No  Lab Results  Component Value Date   HGBA1C 5.5 01/15/2018     How often do you need to have someone help you when you read instructions, pamphlets, or other written materials from your doctor or pharmacy?: 1 - Never  Interpreter Needed?: No  Information entered by :: R. Keven Osborn LPN   Activities of Daily Living     01/29/2024    3:05 PM 10/09/2023    8:30 AM  In your present state of health, do you have any difficulty performing the following activities:  Hearing? 0 0  Vision? 0 1  Difficulty concentrating or making decisions? 0 0  Comment readers   Walking or climbing stairs? 0   Dressing or bathing? 0   Doing errands, shopping? 0   Preparing Food and eating ? N   Using the Toilet? N   In the past six months, have you accidently leaked urine? Y   Do you have problems with loss of bowel control? N   Managing your Medications? N   Managing your Finances? N   Housekeeping or managing your Housekeeping? N     Patient Care Team: Dellar Fenton, MD as PCP - General (Internal Medicine)  Indicate any recent Medical Services you may  have received from other than [facility] providers in the past year (date may be approximate).  Assessment:    This is a routine wellness examination for [place].  Hearing/Vision screen Hearing Screening - Comments:: No issues Vision Screening - Comments:: readers   Goals Addressed             This Visit's Progress    Patient Stated       Wants to have a regular exercise program       Depression Screen     01/29/2024    3:12 PM 11/01/2023    8:35 AM 07/01/2023    8:01 AM 03/27/2023   12:53 PM 12/20/2022    8:32 AM 10/19/2022    8:32 AM 06/27/2022    8:16 AM  PHQ 2/9 Scores  PHQ - 2 Score 1 2 2 2  0 1 1  PHQ- 9 Score 1 2 2 3  2      Fall Risk     01/29/2024    3:07 PM 12/20/2022    8:33 AM 10/19/2022    8:32 AM 06/27/2022    8:16 AM 04/05/2022    9:59 AM  Fall Risk   Falls in the past year? 0 0 0 0 0  Number falls in past yr: 0 0 0 0 0  Injury with Fall? 0 0 0 0 0  Risk for fall due to : No Fall Risks  No Fall Risks No Fall Risks No Fall Risks  Follow up Falls evaluation completed;Falls prevention discussed Falls evaluation completed;Falls prevention discussed Falls evaluation completed Falls evaluation completed Falls evaluation completed    MEDICARE RISK AT HOME:  Medicare Risk at Home Any stairs in or around the home?: Yes If so, are there any without handrails?: Yes (on porch) Home free of loose throw rugs in walkways, pet beds, electrical cords, etc?: Yes Adequate lighting in your home to reduce risk of falls?: Yes Life alert?: No Use of a cane, walker or w/c?: No Grab bars in the bathroom?: Yes Shower chair or bench in shower?: No Elevated toilet seat or a handicapped toilet?: No  TIMED UP AND GO:  Was the test performed?  No  Cognitive Function: 6CIT completed    11/13/2016    9:41 AM  MMSE - Mini Mental State Exam  Orientation to time 5  Orientation to Place 5  Registration 3  Attention/ Calculation 5  Recall 3  Language- name 2 objects 2  Language-  repeat 1  Language- follow 3 step command 3  Language- read & follow direction 1  Write a sentence 1  Copy design 1  Total score 30        01/29/2024    3:20 PM 12/20/2022    8:36 AM 04/26/2020    2:26 PM 02/04/2019    9:46 AM  6CIT Screen  What Year? 0 points 0 points 0 points 0 points  What month? 0 points 0 points 0 points 0 points  What time? 0 points 0 points  0 points  Count back from 20 0 points 0 points  0 points  Months in reverse 0 points 0 points 0 points 0 points  Repeat phrase 0 points 0 points 0 points 0 points  Total Score 0 points 0 points  0 points    Immunizations Immunization History  Administered Date(s) Administered   Fluad Quad(high Dose 65+) 07/11/2021   Influenza Split 06/15/2014   Influenza, High Dose Seasonal PF 10/11/2016, 05/17/2017, 06/30/2018, 06/04/2019, 06/11/2023   Influenza,inj,Quad PF,6+ Mos 07/01/2013, 06/27/2015   Influenza-Unspecified 06/30/2018, 07/11/2020   Moderna Sars-Covid-2 Vaccination 10/19/2019,  11/16/2019, 08/28/2020   Pneumococcal Conjugate-13 11/13/2016   Pneumococcal Polysaccharide-23 08/27/2018   Zoster Recombinant(Shingrix) 09/20/2021, 02/28/2022   Zoster, Live 05/11/2014    Screening Tests Health Maintenance  Topic Date Due   DTaP/Tdap/Td (1 - Tdap) Never done   DEXA SCAN  Never done   COVID-19 Vaccine (4 - 2024-25 season) 05/12/2023   Medicare Annual Wellness (AWV)  12/20/2023   INFLUENZA VACCINE  04/10/2024   MAMMOGRAM  07/15/2024   Colonoscopy  09/26/2028   Pneumonia Vaccine 13+ Years old  Completed   Hepatitis C Screening  Completed   Zoster Vaccines- Shingrix  Completed   HPV VACCINES  Aged Out   Meningococcal B Vaccine  Aged Out    Health Maintenance  Health Maintenance Due  Topic Date Due   DTaP/Tdap/Td (1 - Tdap) Never done   DEXA SCAN  Never done   COVID-19 Vaccine (4 - 2024-25 season) 05/12/2023   Medicare Annual Wellness (AWV)  12/20/2023   Health Maintenance Items Addressed: Discussed the  need to update tetanus (Tdap) vaccine. Patient declines covid vaccine and Dexa at this time.  Additional Screening:  Vision Screening: Recommended annual ophthalmology exams for early detection of glaucoma and other disorders of the eye.Up to date [facility]  Dental Screening: Recommended annual dental exams for proper oral hygiene  Community Resource Referral / Chronic Care Management: CRR required this visit?  No   CCM required this visit?  No   Plan:    I have personally reviewed and noted the following in the patient's chart:   Medical and social history Use of alcohol, tobacco or illicit drugs  Current medications and supplements including opioid prescriptions. Patient is not currently taking opioid prescriptions. Functional ability and status Nutritional status Physical activity Advanced directives List of other physicians Hospitalizations, surgeries, and ER visits in previous 12 months Vitals Screenings to include cognitive, depression, and falls Referrals and appointments  In addition, I have reviewed and discussed with patient certain preventive protocols, quality metrics, and best practice recommendations. A written personalized care plan for preventive services as well as general preventive health recommendations were provided to patient.   Felicitas Horse, LPN   4/40/1027   After Visit Summary: (MyChart) Due to this being a telephonic visit, the after visit summary with patients personalized plan was offered to patient via MyChart   Notes: Nothing significant to report at this time.

## 2024-01-29 NOTE — Patient Instructions (Signed)
 Ms. Rigdon , Thank you for taking time out of your busy schedule to complete your Annual Wellness Visit with me. I enjoyed our conversation and look forward to speaking with you again next year. I, as well as your care team,  appreciate your ongoing commitment to your health goals. Please review the following plan we discussed and let me know if I can assist you in the future. Your Game plan/ To Do List    Referrals: If you haven't heard from the office you've been referred to, please reach out to them at the phone provided.  Remember to update your tetanus (Tdap) vaccine. Follow up Visits: Next Medicare AWV with our clinical staff: 02/05/25 @ 8:50   Have you seen your provider in the last 6 months (3 months if uncontrolled diabetes)? Yes Next Office Visit with your provider: 02/28/24  Clinician Recommendations:  Aim for 30 minutes of exercise or brisk walking, 6-8 glasses of water, and 5 servings of fruits and vegetables each day.       This is a list of the screening recommended for you and due dates:  Health Maintenance  Topic Date Due   DTaP/Tdap/Td vaccine (1 - Tdap) Never done   DEXA scan (bone density measurement)  Never done   COVID-19 Vaccine (4 - 2024-25 season) 05/12/2023   Flu Shot  04/10/2024   Mammogram  07/15/2024   Medicare Annual Wellness Visit  01/28/2025   Colon Cancer Screening  09/26/2028   Pneumonia Vaccine  Completed   Hepatitis C Screening  Completed   Zoster (Shingles) Vaccine  Completed   HPV Vaccine  Aged Out   Meningitis B Vaccine  Aged Out    Advanced directives: (ACP Link)Information on Advanced Care Planning can be found at [place]  Secretary of [facility] Advance Health Care Directives Advance Health Care Directives. [email or web address]  Advance Care Planning is important because it:  [x]  Makes sure you receive the medical care that is consistent with your values, goals, and preferences  [x]  It provides guidance to your family and loved ones and reduces their  decisional burden about whether or not they are making the right decisions based on your wishes.  Follow the link provided in your after visit summary or read over the paperwork we have mailed to you to help you started getting your Advance Directives in place. If you need assistance in completing these, please reach out to us  so that we can help you!

## 2024-02-20 DIAGNOSIS — M791 Myalgia, unspecified site: Secondary | ICD-10-CM | POA: Diagnosis not present

## 2024-02-20 DIAGNOSIS — M47812 Spondylosis without myelopathy or radiculopathy, cervical region: Secondary | ICD-10-CM | POA: Diagnosis not present

## 2024-02-20 DIAGNOSIS — M5030 Other cervical disc degeneration, unspecified cervical region: Secondary | ICD-10-CM | POA: Diagnosis not present

## 2024-02-28 ENCOUNTER — Ambulatory Visit: Payer: Medicare Other | Admitting: Internal Medicine

## 2024-03-02 ENCOUNTER — Encounter: Payer: Self-pay | Admitting: Internal Medicine

## 2024-03-02 ENCOUNTER — Ambulatory Visit (INDEPENDENT_AMBULATORY_CARE_PROVIDER_SITE_OTHER): Admitting: Internal Medicine

## 2024-03-02 VITALS — BP 110/70 | HR 67 | Temp 98.2°F | Resp 16 | Ht 64.0 in | Wt 114.2 lb

## 2024-03-02 DIAGNOSIS — D696 Thrombocytopenia, unspecified: Secondary | ICD-10-CM | POA: Diagnosis not present

## 2024-03-02 DIAGNOSIS — F320 Major depressive disorder, single episode, mild: Secondary | ICD-10-CM

## 2024-03-02 DIAGNOSIS — R319 Hematuria, unspecified: Secondary | ICD-10-CM

## 2024-03-02 DIAGNOSIS — J029 Acute pharyngitis, unspecified: Secondary | ICD-10-CM

## 2024-03-02 DIAGNOSIS — D61818 Other pancytopenia: Secondary | ICD-10-CM

## 2024-03-02 DIAGNOSIS — E7800 Pure hypercholesterolemia, unspecified: Secondary | ICD-10-CM

## 2024-03-02 DIAGNOSIS — M542 Cervicalgia: Secondary | ICD-10-CM

## 2024-03-02 DIAGNOSIS — I700 Atherosclerosis of aorta: Secondary | ICD-10-CM

## 2024-03-02 DIAGNOSIS — G43709 Chronic migraine without aura, not intractable, without status migrainosus: Secondary | ICD-10-CM

## 2024-03-02 DIAGNOSIS — J329 Chronic sinusitis, unspecified: Secondary | ICD-10-CM

## 2024-03-02 DIAGNOSIS — K5900 Constipation, unspecified: Secondary | ICD-10-CM

## 2024-03-02 DIAGNOSIS — K219 Gastro-esophageal reflux disease without esophagitis: Secondary | ICD-10-CM

## 2024-03-02 LAB — POC COVID19 BINAXNOW: SARS Coronavirus 2 Ag: NEGATIVE

## 2024-03-02 LAB — BASIC METABOLIC PANEL WITH GFR
BUN: 16 mg/dL (ref 6–23)
CO2: 29 meq/L (ref 19–32)
Calcium: 9.1 mg/dL (ref 8.4–10.5)
Chloride: 106 meq/L (ref 96–112)
Creatinine, Ser: 0.82 mg/dL (ref 0.40–1.20)
GFR: 69.86 mL/min (ref >60.00)
Glucose, Bld: 94 mg/dL (ref 70–99)
Potassium: 4.6 meq/L (ref 3.5–5.1)
Sodium: 140 meq/L (ref 135–145)

## 2024-03-02 LAB — LIPID PANEL
Cholesterol: 136 mg/dL (ref 0–200)
HDL: 54.9 mg/dL (ref >39.00)
LDL Cholesterol: 67 mg/dL (ref 0–99)
NonHDL: 80.79
Total CHOL/HDL Ratio: 2
Triglycerides: 70 mg/dL (ref 0.0–149.0)
VLDL: 14 mg/dL (ref 0.0–40.0)

## 2024-03-02 LAB — CBC WITH DIFFERENTIAL/PLATELET
Basophils Absolute: 0 10*3/uL (ref 0.0–0.1)
Basophils Relative: 0.6 % (ref 0.0–3.0)
Eosinophils Absolute: 0.1 10*3/uL (ref 0.0–0.7)
Eosinophils Relative: 3.8 % (ref 0.0–5.0)
HCT: 38.2 % (ref 36.0–46.0)
Hemoglobin: 12.6 g/dL (ref 12.0–15.0)
Lymphocytes Relative: 36.5 % (ref 12.0–46.0)
Lymphs Abs: 1.3 10*3/uL (ref 0.7–4.0)
MCHC: 33.1 g/dL (ref 30.0–36.0)
MCV: 89.3 fl (ref 78.0–100.0)
Monocytes Absolute: 0.4 10*3/uL (ref 0.1–1.0)
Monocytes Relative: 10.9 % (ref 3.0–12.0)
Neutro Abs: 1.7 10*3/uL (ref 1.4–7.7)
Neutrophils Relative %: 48.2 % (ref 43.0–77.0)
Platelets: 115 10*3/uL — ABNORMAL LOW (ref 150.0–400.0)
RBC: 4.27 Mil/uL (ref 3.87–5.11)
RDW: 13.5 % (ref 11.5–15.5)
WBC: 3.5 10*3/uL — ABNORMAL LOW (ref 4.0–10.5)

## 2024-03-02 LAB — HEPATIC FUNCTION PANEL
ALT: 14 U/L (ref 0–35)
AST: 23 U/L (ref 0–37)
Albumin: 4.1 g/dL (ref 3.5–5.2)
Alkaline Phosphatase: 43 U/L (ref 39–117)
Bilirubin, Direct: 0.1 mg/dL (ref 0.0–0.3)
Total Bilirubin: 0.5 mg/dL (ref 0.2–1.2)
Total Protein: 6.5 g/dL (ref 6.0–8.3)

## 2024-03-02 NOTE — Assessment & Plan Note (Signed)
 Being followed by ortho for neck pain.  S/p C7-T1 ESI - 05/15/23.  Previous injection helped. Evaluated 02/20/24 - Planning for ESI. Scheduled for 03/11/24.

## 2024-03-02 NOTE — Assessment & Plan Note (Signed)
 Continue trazodone and cymbalta . Continue to follow up with psychiatry. Stable.

## 2024-03-02 NOTE — Assessment & Plan Note (Signed)
 Discussed with hematology. Follow cbc.

## 2024-03-02 NOTE — Patient Instructions (Signed)
 Saline nasal spray - flush nose 1-2x/day  Flonase  nasal spray - 2 sprays each nostril one time per day. Do this in the evening.   Mucinex - take as needed for congestoin.   Delsym  - as needed for cough.

## 2024-03-02 NOTE — Assessment & Plan Note (Signed)
 Followed by neurology. Stable. Neck does aggravate. Plan treatment as outlined. Continue f/u with neurology.

## 2024-03-02 NOTE — Assessment & Plan Note (Signed)
 Continue crestor

## 2024-03-02 NOTE — Progress Notes (Signed)
 Subjective:    Patient ID: Brandi Ramos, female    DOB: Dec 20, 1945, 78 y.o.   MRN: [id number]  Patient here for No chief complaint on file.   HPI Here for a scheduled follow up - follow up regarding increased stress/anxiety. Being followed by ortho for neck pain.  S/p C7-T1 ESI - 05/15/23.  Previous injection helped. Evaluated 02/20/24 - Planning for ESI. Scheduled for 03/11/24. Also being followed by headache clinic. Flares with neck, but overall headaches are relatively stable. Had f/u with GI 10/15/23 - f/u GERD - better on aciphex  and carafate  bid. F/u MRI - ok. Planning for colonoscopy. Also recommended increase stool softener to 2/day and increased hydration and add daily psyllium. Colonoscopy scheduled for 04/2024. Bowels are some better. Her main complaint today is sore throat and sinus pressure/nasal congestion symptoms started 3-4 days ago. Left ear fullness. Increased sinus pressure - left > right. Nasal congestion. Sore throat - hurts to swallow. She is eating. Some decreased appetite. No nausea or vomiting. No diarrhea.    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    Past Surgical History:  Procedure Laterality Date   ABDOMINAL HYSTERECTOMY  1984   CATARACT EXTRACTION W/PHACO Right 09/25/2023   Procedure: CATARACT EXTRACTION PHACO AND INTRAOCULAR LENS PLACEMENT (IOC) RIGHT CLAREON VIVITY TORIC  5.25  00:35.3;  Surgeon: Mittie Gaskin, MD;  Location: [facility];  Service:  Ophthalmology;  Laterality: Right;   CATARACT EXTRACTION W/PHACO Left 10/09/2023   Procedure: CATARACT EXTRACTION PHACO AND INTRAOCULAR LENS PLACEMENT (IOC) LEFT  CLAREON VIVITY TORIC;  Surgeon: Mittie Gaskin, MD;  Location: [facility];  Service: Ophthalmology;  Laterality: Left;  7.50 0:33.4   COLONOSCOPY     COLONOSCOPY WITH PROPOFOL  N/A 09/26/2018   Procedure: COLONOSCOPY WITH PROPOFOL ;  Surgeon: Gaylyn Gladis PENNER, MD;  Location: [facility];  Service: Endoscopy;  Laterality: N/A;   ESOPHAGOGASTRODUODENOSCOPY (EGD) WITH PROPOFOL  N/A 10/31/2015   Procedure: ESOPHAGOGASTRODUODENOSCOPY (EGD) WITH PROPOFOL ;  Surgeon: Gladis PENNER Gaylyn, MD;  Location[facility];  Service: Endoscopy;  Laterality: N/A;   ESOPHAGOGASTRODUODENOSCOPY (EGD) WITH PROPOFOL  N/A 09/26/2018   Procedure: ESOPHAGOGASTRODUODENOSCOPY (EGD) WITH PROPOFOL ;  Surgeon: Gaylyn Gladis PENNER, MD;  Location: [facility] ENDOSCOPY;  Service: Endoscopy;  Laterality: N/A;   LAPAROSCOPIC APPENDECTOMY N/A 10/29/2018   Procedure: APPENDECTOMY LAPAROSCOPIC;  Surgeon: Rodolph Romano, MD;  Location: [facility];  Service: General;  Laterality: N/A;   LAPAROSCOPIC BILATERAL SALPINGO OOPHERECTOMY Bilateral 10/29/2018   Procedure: LAPAROSCOPIC BILATERAL SALPINGO OOPHORECTOMY;  Surgeon: Ward, Mitzie BROCKS, MD;  Location: [facility];  Service: Gynecology;  Laterality: Bilateral;   Family History  Problem Relation Age of Onset   CAD Mother        s/p CABG   Hypercholesterolemia Mother    Breast cancer Mother 18   Heart disease Mother    Lung cancer Mother  Hypertension Father    Migraines Father    Hypercholesterolemia Father    Lung cancer Father    Migraines Sister    Arthritis Sister    Lymphoma Other        grandmother   Colon cancer Neg Hx    Social History   Socioeconomic History   Marital status: Widowed    Spouse name: Not on file   Number of children: 2   Years of education: Not on file   Highest education  level: 12th grade  Occupational History   Not on file  Tobacco Use   Smoking status: Never   Smokeless tobacco: Never  Vaping Use   Vaping status: Never Used  Substance and Sexual Activity   Alcohol use: Yes    Alcohol/week: 0.0 standard drinks of alcohol    Comment: wine occassionally   Drug use: No   Sexual activity: Not Currently  Other Topics Concern   Not on file  Social History Narrative   Not on file   Social Drivers of Health   Financial Resource Strain: Low Risk  (02/27/2024)   Overall Financial Resource Strain (CARDIA)    Difficulty of Paying Living Expenses: Not hard at all  Food Insecurity: No Food Insecurity (02/27/2024)   Hunger Vital Sign    Worried About Running Out of Food in the Last Year: Never true    Ran Out of Food in the Last Year: Never true  Transportation Needs: No Transportation Needs (02/27/2024)   PRAPARE - Administrator, Civil Service (Medical): No    Lack of Transportation (Non-Medical): No  Physical Activity: Insufficiently Active (02/27/2024)   Exercise Vital Sign    Days of Exercise per Week: 3 days    Minutes of Exercise per Session: 40 min  Stress: No Stress Concern Present (02/27/2024)   [employer] of Occupational Health - Occupational Stress Questionnaire    Feeling of Stress: Only a little  Social Connections: Moderately Integrated (02/27/2024)   Social Connection and Isolation Panel    Frequency of Communication with Friends and Family: Three times a week    Frequency of Social Gatherings with Friends and Family: Three times a week    Attends Religious Services: More than 4 times per year    Active Member of Clubs or Organizations: Yes    Attends Banker Meetings: More than 4 times per year    Marital Status: Widowed     Review of Systems  Constitutional:  Negative for appetite change, fever and unexpected weight change.  HENT:  Positive for congestion, postnasal drip, sinus pressure and sore throat.    Respiratory:  Negative for chest tightness and shortness of breath.        Some cough. No chest congestion.   Cardiovascular:  Negative for chest pain, palpitations and leg swelling.  Gastrointestinal:  Negative for abdominal pain, diarrhea, nausea and vomiting.  Genitourinary:  Negative for difficulty urinating and dysuria.  Musculoskeletal:  Negative for joint swelling and myalgias.  Skin:  Negative for color change and rash.  Neurological:  Negative for dizziness.       Headaches - chronic - stable. Aggravated by her neck.   Psychiatric/Behavioral:  Negative for agitation and dysphoric mood.        Objective:     BP 110/70   Pulse 67   Temp 98.2 F (36.8 C)   Resp 16   Ht 5' 4 (1.626 m)   Wt 114 lb 3.2 oz (  51.8 kg)   SpO2 98%   BMI 19.60 kg/m  Wt Readings from Last 3 Encounters:  03/02/24 114 lb 3.2 oz (51.8 kg)  01/29/24 117 lb (53.1 kg)  01/10/24 116 lb (52.6 kg)    Physical Exam Vitals reviewed.  Constitutional:      General: She is not in acute distress.    Appearance: Normal appearance.  HENT:     Head: Normocephalic and atraumatic.     Right Ear: Tympanic membrane, ear canal and external ear normal.     Left Ear: Tympanic membrane, ear canal and external ear normal.     Nose:     Comments: Erythematous turbinates.     Mouth/Throat:     Pharynx: No oropharyngeal exudate.     Comments: No increased erythema visualized.   Eyes:     General: No scleral icterus.       Right eye: No discharge.        Left eye: No discharge.     Conjunctiva/sclera: Conjunctivae normal.   Neck:     Thyroid : No thyromegaly.   Cardiovascular:     Rate and Rhythm: Normal rate and regular rhythm.  Pulmonary:     Effort: No respiratory distress.     Breath sounds: Normal breath sounds. No wheezing.  Abdominal:     General: Bowel sounds are normal.     Palpations: Abdomen is soft.     Tenderness: There is no abdominal tenderness.   Musculoskeletal:        General: No  swelling or tenderness.     Cervical back: Neck supple. No tenderness.  Lymphadenopathy:     Cervical: No cervical adenopathy.   Skin:    Findings: No erythema or rash.   Neurological:     Mental Status: She is alert.   Psychiatric:        Mood and Affect: Mood normal.        Behavior: Behavior normal.         Outpatient Encounter Medications as of 03/02/2024  Medication Sig   ALPRAZolam  (NIRAVAM ) 0.25 MG dissolvable tablet Take 0.25 mg by mouth as needed for anxiety.   DULoxetine  (CYMBALTA ) 60 MG capsule Take 1 capsule (60 mg total) by mouth daily.   Fe Fum-FePoly-Vit C-Vit B3 (INTEGRA) 62.5-62.5-40-3 MG CAPS Take 1 capsule by mouth daily.   fexofenadine (ALLEGRA) 180 MG tablet Take 180 mg by mouth daily as needed for allergies or rhinitis.   fluticasone  (FLONASE ) 50 MCG/ACT nasal spray Place 2 sprays into both nostrils daily.   magnesium  oxide (MAG-OX) 400 (240 Mg) MG tablet Take 1 tablet (400 mg total) by mouth daily.   ondansetron  (ZOFRAN  ODT) 4 MG disintegrating tablet Take 1 tablet (4 mg total) by mouth 2 (two) times daily as needed for nausea or vomiting.   pantoprazole  (PROTONIX ) 40 MG tablet Take 1 tablet (40 mg total) by mouth daily.   propranolol  ER (INDERAL  LA) 60 MG 24 hr capsule TK 1 C PO D   PSEUDOEPHEDRINE HCL PO Take by mouth as needed.   rizatriptan (MAXALT) 10 MG tablet Take 10 mg by mouth as needed. May repeat in 2 hours if needed   rosuvastatin  (CRESTOR ) 5 MG tablet Take 1 tablet (5 mg total) by mouth daily.   sucralfate  (CARAFATE ) 1 g tablet Take 1 tablet (1 g total) by mouth 2 (two) times daily as needed.   SUMAtriptan (IMITREX) 100 MG tablet    topiramate  (TOPAMAX ) 50 MG tablet TAKE 1 AND 1/2  TABLET BY MOUTH IN MORNING AND 1 TABLET BY MOUTH IN EVENING   traZODone (DESYREL) 100 MG tablet Take 100 mg by mouth at bedtime.   [DISCONTINUED] nitrofurantoin , macrocrystal-monohydrate, (MACROBID ) 100 MG capsule Take 1 capsule (100 mg total) by mouth every 12  (twelve) hours. (Patient not taking: Reported on 01/29/2024)   No facility-administered encounter medications on file as of 03/02/2024.     Lab Results  Component Value Date   WBC 3.6 (L) 11/01/2023   HGB 12.4 11/01/2023   HCT 37.3 11/01/2023   PLT 143.0 (L) 11/01/2023   GLUCOSE 99 11/01/2023   CHOL 137 11/01/2023   TRIG 81.0 11/01/2023   HDL 54.00 11/01/2023   LDLCALC 67 11/01/2023   ALT 15 11/01/2023   AST 26 11/01/2023   NA 141 11/01/2023   K 4.3 11/01/2023   CL 106 11/01/2023   CREATININE 0.93 11/01/2023   BUN 16 11/01/2023   CO2 32 11/01/2023   TSH 2.15 07/01/2023   HGBA1C 5.5 01/15/2018       Assessment & Plan:  Pancytopenia [facility]) Assessment & Plan: Discussed with hematology. Follow cbc.   Orders: -     CBC with Differential/Platelet  Hypercholesterolemia Assessment & Plan: Continue crestor . Low cholesterol diet and exercise. Check lipid panel today.   Orders: -     Lipid panel -     Hepatic function panel -     Basic metabolic panel with GFR  Sore throat -     POC COVID-19 BinaxNow  Thrombocytopenia (HCC) Assessment & Plan: Have discussed with hematology. Follow cbc.    Sinusitis, unspecified chronicity, unspecified location Assessment & Plan: Symptoms as outlined. Present for 3-4 days. Exam as outlined. Probably viral. Treat symptoms - saline nasal spray and flonase  nasal spray as directed. Mucinex and delsym as needed for cough and congestion. Rest. Fluids. Call with update. Pt requested covid swab. Hold abx. Covid negative.    Neck pain Assessment & Plan: Being followed by ortho for neck pain.  S/p C7-T1 ESI - 05/15/23.  Previous injection helped. Evaluated 02/20/24 - Planning for ESI. Scheduled for 03/11/24.    Major depressive disorder, single episode, mild (HCC) Assessment & Plan: Continue trazodone and cymbalta . Continue to follow up with psychiatry. Stable.    Hematuria, unspecified type Assessment & Plan: Being followed by urology. .   Evaluated 04/10/23. Being followed for kidney stone.  Work up in 2019 NED -no reports of gross heme -UA negative for micro heme Recommend return in 2 years (around 03/2025) for KUB, UA and OAB.    Gastroesophageal reflux disease, unspecified whether esophagitis present Assessment & Plan: Continue on aciphex . Trial carafate . Symptoms controlled. No changes made.    Constipation, unspecified constipation type Assessment & Plan: Has seen GI. Recommendations as outlined. Bowels some better.    Aortic atherosclerosis (HCC) Assessment & Plan: Continue crestor .    Chronic migraine without aura without status migrainosus, not intractable Assessment & Plan: Followed by neurology. Stable. Neck does aggravate. Plan treatment as outlined. Continue f/u with neurology.       Allena Hamilton, MD

## 2024-03-02 NOTE — Assessment & Plan Note (Signed)
Have discussed with hematology.  Follow cbc.

## 2024-03-02 NOTE — Assessment & Plan Note (Signed)
 Continue on aciphex . Trial carafate . Symptoms controlled. No changes made.

## 2024-03-02 NOTE — Assessment & Plan Note (Signed)
 Has seen GI. Recommendations as outlined. Bowels some better.

## 2024-03-02 NOTE — Assessment & Plan Note (Addendum)
 Symptoms as outlined. Present for 3-4 days. Exam as outlined. Probably viral. Treat symptoms - saline nasal spray and flonase  nasal spray as directed. Mucinex and delsym as needed for cough and congestion. Rest. Fluids. Call with update. Pt requested covid swab. Hold abx. Covid negative.

## 2024-03-02 NOTE — Assessment & Plan Note (Signed)
 Continue crestor . Low cholesterol diet and exercise. Check lipid panel today.

## 2024-03-02 NOTE — Assessment & Plan Note (Signed)
 Being followed by urology. .  Evaluated 04/10/23. Being followed for kidney stone.  Work up in 2019 NED -no reports of gross heme -UA negative for micro heme Recommend return in 2 years (around 03/2025) for KUB, UA and OAB.

## 2024-03-03 ENCOUNTER — Other Ambulatory Visit: Payer: Self-pay | Admitting: Internal Medicine

## 2024-03-03 ENCOUNTER — Ambulatory Visit: Payer: Self-pay | Admitting: Internal Medicine

## 2024-03-03 DIAGNOSIS — D696 Thrombocytopenia, unspecified: Secondary | ICD-10-CM

## 2024-03-03 NOTE — Telephone Encounter (Unsigned)
 Copied from CRM [id number]. Topic: Clinical - Lab/Test Results >> Mar 03, 2024 11:17 AM Thersia BROCKS wrote: Reason for CRM: Patient called in regarding missed call,relayed lab results to patient, patient stated she understood and no further questions, has scheduled lab appointment for 2 weeks

## 2024-03-03 NOTE — Progress Notes (Signed)
Order placed for f/u lab.   

## 2024-03-06 ENCOUNTER — Ambulatory Visit: Admitting: Urology

## 2024-03-06 VITALS — BP 132/71 | HR 75 | Ht 64.0 in | Wt 116.0 lb

## 2024-03-06 DIAGNOSIS — R300 Dysuria: Secondary | ICD-10-CM

## 2024-03-06 DIAGNOSIS — R3129 Other microscopic hematuria: Secondary | ICD-10-CM | POA: Diagnosis not present

## 2024-03-06 LAB — URINALYSIS, COMPLETE
Bilirubin, UA: NEGATIVE
Glucose, UA: NEGATIVE
Ketones, UA: NEGATIVE
Leukocytes,UA: NEGATIVE
Nitrite, UA: NEGATIVE
Protein,UA: NEGATIVE
RBC, UA: NEGATIVE
Specific Gravity, UA: 1.01 (ref 1.005–1.030)
Urobilinogen, Ur: 0.2 mg/dL (ref 0.2–1.0)
pH, UA: 6 (ref 5.0–7.5)

## 2024-03-06 LAB — MICROSCOPIC EXAMINATION: RBC, Urine: NONE SEEN /HPF (ref 0–2)

## 2024-03-09 ENCOUNTER — Encounter: Payer: Self-pay | Admitting: Urology

## 2024-03-09 DIAGNOSIS — F411 Generalized anxiety disorder: Secondary | ICD-10-CM | POA: Diagnosis not present

## 2024-03-09 DIAGNOSIS — F3341 Major depressive disorder, recurrent, in partial remission: Secondary | ICD-10-CM | POA: Diagnosis not present

## 2024-03-09 DIAGNOSIS — F410 Panic disorder [episodic paroxysmal anxiety] without agoraphobia: Secondary | ICD-10-CM | POA: Diagnosis not present

## 2024-03-09 NOTE — Progress Notes (Signed)
 03/06/2024 3:15 PM   Brandi Ramos 1946/01/23 [id number]  Referring provider: Glendia Shad, MD [address] [place],  [place] [address]  Urological history: 1.  High risk hematuria - Non-smoker - CTU 01/2018 Nonobstructive right nephrolithiasis includes a 3 mm upper pole calculus on image 73/5; an 8 mm in long axis lower pole calculus on image 63/5; and a 4 mm lower pole calculus on image 66/5.  Left-sided renal calculi include a 1-2 mm upper pole nonobstructive calculus and a 1-2 mm lower pole nonobstructive calculus. There is also faint calcification along the left renal capsule on image 60/5 medially.  Some cortical hypodensity in thinning in the left mid upper kidney adjacent to the spleen. Given the slightly scalloped contour I favor that this is probably from remote prior scarring, and a similar appearance was present on 11/16/2015 - Cysto 02/2018 with Dr. Penne was negative -Contrast CT in 10/2018 Nonobstructing calculus in the lower pole of the right kidney measuring up to 7 mm on image 47/4. There are probable additional tiny renal calculi -Cystoscopy with Dr. Twylla in 11/2019 was NED - MR abdomen (04/2023) NED   2. Nephrolithiasis - Contrast CT in 10/2018 noted there is a nonobstructing calculus in the lower pole of the right kidney measuring up to 7 mm on image 47/4. There are probable additional tiny renal calculi - Contrast CT in 10/2020 7 mm stone noted within the inferior pole of the right kidney. - KUB, 02/2021 - stable right renal calculi 8 mm  -KUB (04/2022) -2 small calcific densities overlying the right kidney suggesting right renal stones -Abdominal US  (03/2023) -possible nonobstructing calculi are seen in both kidneys  3. OAB  Chief Complaint  Patient presents with   Hematuria   HPI: Brandi Ramos is a 78 y.o. woman who presents today for 68-month follow-up after having a urinary tract infection with microscopic  hematuria.  Previous records reviewed.   She presented on Jan 10, 2024 with a 3-day history of dysuria, urgency, frequency and urge incontinence.  Her UA was suspicious for infection and had 11-30 RBCs.  Urine culture was positive for E. coli.  She was treated with 7 days of culture appropriate antibiotics.  Today she is feeling well.  Her dysuria, urgency, frequency and urge incontinence have abated after taking antibiotics.  Patient denies any modifying or aggravating factors.  Patient denies any recent UTI's, gross hematuria, dysuria or suprapubic/flank pain.  Patient denies any fevers, chills, nausea or vomiting.    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     Surgical History: Past Surgical History:  Procedure Laterality Date   ABDOMINAL HYSTERECTOMY  1984   CATARACT EXTRACTION W/PHACO Right 09/25/2023   Procedure: CATARACT EXTRACTION PHACO AND INTRAOCULAR LENS PLACEMENT (IOC) RIGHT CLAREON VIVITY TORIC  5.25  00:35.3;  Surgeon: Mittie Gaskin, MD;  Location: [facility];  Service: Ophthalmology;  Laterality: Right;   CATARACT EXTRACTION W/PHACO Left 10/09/2023   Procedure: CATARACT EXTRACTION PHACO AND INTRAOCULAR LENS PLACEMENT (IOC) LEFT  CLAREON VIVITY TORIC;  Surgeon: Mittie Gaskin, MD;  Location: [facility];  Service: Ophthalmology;  Laterality: Left;  7.50 0:33.4   COLONOSCOPY     COLONOSCOPY WITH PROPOFOL  N/A 09/26/2018   Procedure:  COLONOSCOPY WITH PROPOFOL ;  Surgeon: Gaylyn Gladis PENNER, MD;  Location: [facility] ENDOSCOPY;  Service: Endoscopy;  Laterality: N/A;   ESOPHAGOGASTRODUODENOSCOPY (EGD) WITH PROPOFOL  N/A 10/31/2015   Procedure: ESOPHAGOGASTRODUODENOSCOPY (EGD) WITH PROPOFOL ;  Surgeon: Gladis PENNER Gaylyn, MD;  Location: [facility] END[facility]COPY;  Service: Endoscopy;  Laterality: N/A;   ESOPHAGOGASTRODUODENOSCOPY (EGD) WITH PROPOFOL  N/A 09/26/2018   Procedure: ESOPHAGOGASTRODUODENOSCOPY (EGD) WITH PROPOFOL ;  Surgeon: Gaylyn Gladis PENNER, MD;  Location: [facility] ENDOSCOPY;  Service: Endoscopy;  Laterality: N/A;   LAPAROSCOPIC APPENDECTOMY N/A 10/29/2018   Procedure: APPENDECTOMY LAPAROSCOPIC;  Surgeon: Rodolph Romano, MD;  Location: [facility];  Service: General;  Laterality: N/A;   LAPAROSCOPIC BILATERAL SALPINGO OOPHERECTOMY Bilateral 10/29/2018   Procedure: LAPAROSCOPIC BILATERAL SALPINGO OOPHORECTOMY;  Surgeon: Ward, Mitzie BROCKS, MD;  Location: [facility];  Service: Gynecology;  Laterality: Bilateral;    Home Medications:  Allergies as of 03/06/2024       Reactions   Amitiza [lubiprostone] Nausea Only   Sulfa Antibiotics    GI discomfort        Medication List        Accurate as of March 06, 2024 11:59 PM. If you have any questions, ask your nurse or doctor.          ALPRAZolam  0.25 MG dissolvable tablet Commonly known as: NIRAVAM  Take 0.25 mg by mouth as needed for anxiety.   DULoxetine  60 MG capsule Commonly known as: CYMBALTA  Take 1 capsule (60 mg total) by mouth daily.   fexofenadine 180 MG tablet Commonly known as: ALLEGRA Take 180 mg by mouth daily as needed for allergies or rhinitis.   fluticasone  50 MCG/ACT nasal spray Commonly known as: FLONASE  Place 2 sprays into both nostrils daily.   Integra 62.5-62.5-40-3 MG Caps Take 1 capsule by mouth daily.   magnesium  oxide 400 (240 Mg) MG tablet Commonly known as: MAG-OX Take 1 tablet (400 mg total) by mouth daily.   ondansetron  4 MG disintegrating  tablet Commonly known as: Zofran  ODT Take 1 tablet (4 mg total) by mouth 2 (two) times daily as needed for nausea or vomiting.   pantoprazole  40 MG tablet Commonly known as: PROTONIX  Take 1 tablet (40 mg total) by mouth daily.   propranolol  ER 60 MG 24 hr capsule Commonly known as: INDERAL  LA TK 1 C PO D   PSEUDOEPHEDRINE HCL PO Take by mouth as needed.   rizatriptan 10 MG tablet Commonly known as: MAXALT Take 10 mg by mouth as needed. May repeat in 2 hours if needed   rosuvastatin  5 MG tablet Commonly known as: CRESTOR  TAKE 1 TABLET(5 MG) BY MOUTH DAILY   sucralfate  1 g tablet Commonly known as: CARAFATE  Take 1 tablet (1 g total) by mouth 2 (two) times daily as needed.   SUMAtriptan 100 MG tablet Commonly known as: IMITREX   topiramate  50 MG tablet Commonly known as: TOPAMAX  TAKE 1 AND 1/2 TABLET BY MOUTH IN MORNING AND 1 TABLET BY MOUTH IN EVENING   traZODone 100 MG tablet Commonly known as: DESYREL Take 100 mg by mouth at bedtime.  Allergies:  Allergies  Allergen Reactions   Amitiza [Lubiprostone] Nausea Only   Sulfa Antibiotics     GI discomfort    Family History: Family History  Problem Relation Age of Onset   CAD Mother        s/p CABG   Hypercholesterolemia Mother    Breast cancer Mother 72   Heart disease Mother    Lung cancer Mother    Hypertension Father    Migraines Father    Hypercholesterolemia Father    Lung cancer Father    Migraines Sister    Arthritis Sister    Lymphoma Other        grandmother   Colon cancer Neg Hx     Social History:  reports that she has never smoked. She has never used smokeless tobacco. She reports current alcohol use. She reports that she does not use drugs.  ROS: Pertinent ROS in HPI  Physical Exam: BP 132/71   Pulse 75   Ht 5' 4 (1.626 m)   Wt 116 lb (52.6 kg)   BMI 19.91 kg/m   Constitutional:  Well nourished. Alert and oriented, No acute distress. HEENT: [place] AT, moist mucus membranes.   Trachea midline Cardiovascular: No clubbing, cyanosis, or edema. Respiratory: Normal respiratory effort, no increased work of breathing. Neurologic: Grossly intact, no focal deficits, moving all 4 extremities. Psychiatric: Normal mood and affect.    Laboratory Data: Lab Results  Component Value Date   WBC 3.5 (L) 03/02/2024   HGB 12.6 03/02/2024   HCT 38.2 03/02/2024   MCV 89.3 03/02/2024   PLT 115.0 (L) 03/02/2024    Lab Results  Component Value Date   CREATININE 0.82 03/02/2024       Component Value Date/Time   CHOL 136 03/02/2024 1154   HDL 54.90 03/02/2024 1154   CHOLHDL 2 03/02/2024 1154   VLDL 14.0 03/02/2024 1154   LDLCALC 67 03/02/2024 1154    Lab Results  Component Value Date   AST 23 03/02/2024   Lab Results  Component Value Date   ALT 14 03/02/2024   Urinalysis See EPIC and HPI  I have reviewed the labs.   Pertinent Imaging: N/A  Assessment & Plan:    1. Dysuria (Primary) - Secondary to UTI -Symptoms have resolved with treatment with culture appropriate antibiotics  2.  Microscopic hematuria - Likely secondary to urinary tract infection - Recheck on you demonstrates the microscopic hematuria cleared with the treatment of infection  Return if symptoms worsen or fail to improve.  These notes generated with voice recognition software. I apologize for typographical errors.  CLOTILDA HELON RIGGERS  [facility] [address]  [address] [place], [place] [address] [phone]

## 2024-03-11 DIAGNOSIS — M5412 Radiculopathy, cervical region: Secondary | ICD-10-CM | POA: Diagnosis not present

## 2024-03-19 ENCOUNTER — Other Ambulatory Visit (INDEPENDENT_AMBULATORY_CARE_PROVIDER_SITE_OTHER)

## 2024-03-19 DIAGNOSIS — D696 Thrombocytopenia, unspecified: Secondary | ICD-10-CM | POA: Diagnosis not present

## 2024-03-19 LAB — PLATELET COUNT: Platelets: 136 Thousand/uL — ABNORMAL LOW (ref 140–400)

## 2024-03-20 ENCOUNTER — Ambulatory Visit: Payer: Self-pay | Admitting: Internal Medicine

## 2024-04-01 DIAGNOSIS — D2261 Melanocytic nevi of right upper limb, including shoulder: Secondary | ICD-10-CM | POA: Diagnosis not present

## 2024-04-01 DIAGNOSIS — Z85828 Personal history of other malignant neoplasm of skin: Secondary | ICD-10-CM | POA: Diagnosis not present

## 2024-04-01 DIAGNOSIS — L570 Actinic keratosis: Secondary | ICD-10-CM | POA: Diagnosis not present

## 2024-04-01 DIAGNOSIS — D2262 Melanocytic nevi of left upper limb, including shoulder: Secondary | ICD-10-CM | POA: Diagnosis not present

## 2024-04-01 DIAGNOSIS — D225 Melanocytic nevi of trunk: Secondary | ICD-10-CM | POA: Diagnosis not present

## 2024-04-03 ENCOUNTER — Encounter: Payer: Self-pay | Admitting: *Deleted

## 2024-05-18 ENCOUNTER — Encounter: Payer: Self-pay | Admitting: *Deleted

## 2024-05-21 DIAGNOSIS — M5030 Other cervical disc degeneration, unspecified cervical region: Secondary | ICD-10-CM | POA: Diagnosis not present

## 2024-05-21 DIAGNOSIS — M791 Myalgia, unspecified site: Secondary | ICD-10-CM | POA: Diagnosis not present

## 2024-05-21 DIAGNOSIS — M47812 Spondylosis without myelopathy or radiculopathy, cervical region: Secondary | ICD-10-CM | POA: Diagnosis not present

## 2024-05-29 ENCOUNTER — Other Ambulatory Visit: Payer: Self-pay | Admitting: Internal Medicine

## 2024-05-29 MED ORDER — PANTOPRAZOLE SODIUM 40 MG PO TBEC: 40.0000 mg | DELAYED_RELEASE_TABLET | Freq: Every day | ORAL | 1 refills | Status: DC

## 2024-05-29 NOTE — Telephone Encounter (Signed)
 Copied from CRM #[id number]. Topic: Clinical - Medication Refill >> May 29, 2024  2:58 PM Turkey A wrote: Medication: pantoprazole  (PROTONIX ) 40 MG tablet  Has the patient contacted their pharmacy? No (Agent: If no, request that the patient contact the pharmacy for the refill. If patient does not wish to contact the pharmacy document the reason why and proceed with request.) (Agent: If yes, when and what did the pharmacy advise?)  This is the patient's preferred pharmacy:  [id number] [place], [place] - [address] AT [facility] [address] [place] [address] [place] [address] Phone: [phone] Fax: [phone]  Is this the correct pharmacy for this prescription? Yes If no, delete pharmacy and type the correct one.   Has the prescription been filled recently? No  Is the patient out of the medication? Yes 1 Pill left  Has the patient been seen for an appointment in the last year OR does the patient have an upcoming appointment? Yes  Can we respond through MyChart? Yes  Agent: Please be advised that Rx refills may take up to 3 business days. We ask that you follow-up with your pharmacy.

## 2024-06-03 DIAGNOSIS — K080 Exfoliation of teeth due to systemic causes: Secondary | ICD-10-CM | POA: Diagnosis not present

## 2024-06-05 ENCOUNTER — Ambulatory Visit: Admitting: Anesthesiology

## 2024-06-05 ENCOUNTER — Other Ambulatory Visit: Payer: Self-pay

## 2024-06-05 ENCOUNTER — Encounter
Admission: RE | Disposition: A | Discharge status: Home / Self Care | Payer: Self-pay | Source: Home / Self Care | Attending: Gastroenterology

## 2024-06-05 ENCOUNTER — Ambulatory Visit
Admission: RE | Admit: 2024-06-05 | Discharge: 2024-06-05 | Disposition: A | Discharge status: Home / Self Care | Payer: Medicare Other | Attending: Gastroenterology | Admitting: Gastroenterology

## 2024-06-05 DIAGNOSIS — E785 Hyperlipidemia, unspecified: Secondary | ICD-10-CM | POA: Diagnosis not present

## 2024-06-05 DIAGNOSIS — Z79899 Other long term (current) drug therapy: Secondary | ICD-10-CM | POA: Insufficient documentation

## 2024-06-05 DIAGNOSIS — Z860101 Personal history of adenomatous and serrated colon polyps: Secondary | ICD-10-CM | POA: Diagnosis not present

## 2024-06-05 DIAGNOSIS — F32A Depression, unspecified: Secondary | ICD-10-CM | POA: Diagnosis not present

## 2024-06-05 DIAGNOSIS — D649 Anemia, unspecified: Secondary | ICD-10-CM | POA: Diagnosis not present

## 2024-06-05 DIAGNOSIS — Z1211 Encounter for screening for malignant neoplasm of colon: Secondary | ICD-10-CM | POA: Diagnosis not present

## 2024-06-05 DIAGNOSIS — M1990 Unspecified osteoarthritis, unspecified site: Secondary | ICD-10-CM | POA: Insufficient documentation

## 2024-06-05 DIAGNOSIS — Z860100 Personal history of colon polyps, unspecified: Secondary | ICD-10-CM | POA: Insufficient documentation

## 2024-06-05 DIAGNOSIS — Z09 Encounter for follow-up examination after completed treatment for conditions other than malignant neoplasm: Secondary | ICD-10-CM | POA: Diagnosis not present

## 2024-06-05 HISTORY — PX: COLONOSCOPY WITH PROPOFOL: SHX5780

## 2024-06-05 SURGERY — COLONOSCOPY WITH PROPOFOL
Anesthesia: General

## 2024-06-05 MED ORDER — LIDOCAINE HCL (CARDIAC) PF 100 MG/5ML IV SOSY
PREFILLED_SYRINGE | INTRAVENOUS | Status: DC | PRN
  Administered 2024-06-05: 40 mg via INTRAVENOUS

## 2024-06-05 MED ORDER — PROPOFOL 1000 MG/100ML IV EMUL
INTRAVENOUS | Status: AC
  Filled 2024-06-05: qty 100

## 2024-06-05 MED ORDER — DEXMEDETOMIDINE HCL IN NACL 80 MCG/20ML IV SOLN
INTRAVENOUS | Status: DC | PRN
  Administered 2024-06-05 (×3): 4 ug via INTRAVENOUS
  Administered 2024-06-05: 8 ug via INTRAVENOUS

## 2024-06-05 MED ORDER — SODIUM CHLORIDE 0.9 % IV SOLN: INTRAVENOUS | Status: DC

## 2024-06-05 MED ORDER — PROPOFOL 500 MG/50ML IV EMUL
INTRAVENOUS | Status: DC | PRN
  Administered 2024-06-05: 50 ug/kg/min via INTRAVENOUS

## 2024-06-05 MED ORDER — PROPOFOL 10 MG/ML IV BOLUS
INTRAVENOUS | Status: DC | PRN
  Administered 2024-06-05: 20 mg via INTRAVENOUS
  Administered 2024-06-05: 10 mg via INTRAVENOUS

## 2024-06-05 MED ORDER — DEXMEDETOMIDINE HCL IN NACL 80 MCG/20ML IV SOLN
INTRAVENOUS | Status: AC
  Filled 2024-06-05: qty 20

## 2024-06-05 MED ORDER — EPHEDRINE SULFATE-NACL 50-0.9 MG/10ML-% IV SOSY
PREFILLED_SYRINGE | INTRAVENOUS | Status: DC | PRN
  Administered 2024-06-05: 10 mg via INTRAVENOUS

## 2024-06-05 MED ORDER — LIDOCAINE HCL (PF) 2 % IJ SOLN
INTRAMUSCULAR | Status: AC
  Filled 2024-06-05: qty 5

## 2024-06-05 NOTE — Anesthesia Preprocedure Evaluation (Signed)
 Anesthesia Evaluation  Patient identified by MRN, date of birth, ID band Patient awake    Reviewed: Allergy & Precautions, NPO status , Patient's Chart, lab work & pertinent test results  Airway Mallampati: II  TM Distance: >3 FB Neck ROM: full    Dental  (+) Teeth Intact   Pulmonary neg pulmonary ROS   Pulmonary exam normal breath sounds clear to auscultation       Cardiovascular Exercise Tolerance: Good negative cardio ROS Normal cardiovascular exam Rhythm:Regular Rate:Normal     Neuro/Psych   Anxiety     negative neurological ROS  negative psych ROS   GI/Hepatic negative GI ROS, Neg liver ROS, PUD,GERD  Medicated,,  Endo/Other  negative endocrine ROS    Renal/GU negative Renal ROS  negative genitourinary   Musculoskeletal  (+) Arthritis ,    Abdominal Normal abdominal exam  (+)   Peds negative pediatric ROS (+)  Hematology negative hematology ROS (+) Blood dyscrasia, anemia   Anesthesia Other Findings Past Medical History: No date: Anxiety No date: Arthritis No date: Chicken pox No date: Depression No date: Diverticulosis No date: Fibrocystic breast disease No date: Frozen shoulder No date: Fundic gland polyps of stomach, benign No date: GERD (gastroesophageal reflux disease) No date: History of diverticulitis of colon No date: Kidney stones No date: Migraine headache No date: Neutropenia     Comment:  worked up - Dr Wilder, slightly elevated ANA No date: PUD (peptic ulcer disease) No date: Recurrent vaginitis No date: Seasonal allergies No date: Skin cancer, 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   Past Surgical History: 1984: ABDOMINAL HYSTERECTOMY 09/25/2023: CATARACT EXTRACTION W/PHACO; Right     Comment:  Procedure: CATARACT EXTRACTION PHACO AND INTRAOCULAR               LENS PLACEMENT (IOC) RIGHT CLAREON VIV[place] TORIC  5.25                00:35.3;  Surgeon:  Mittie Gaskin, MD;  Location:               [facility];  Service: Ophthalmology;                Laterality: Right; 10/09/2023: CATARACT EXTRACTION W/PHACO; Left     Comment:  Procedure: CATARACT EXTRACTION PHACO AND INTRAOCULAR               LENS PLACEMENT (IOC) LEFT  CLAREON [place];                Surgeon: Mittie Gaskin, MD;  Location: [facility];  Service: Ophthalmology;  Laterality: Left;              7.50 0:33.4 No date: COLONOSCOPY 09/26/2018: COLONOSCOPY WITH PROPOFOL ; N/A     Comment:  Procedure: COLONOSCOPY WITH PROPOFOL ;  Surgeon:               Gaylyn Gladis PENNER, MD;  Location: [facility] ENDOSCOPY;                Service: Endoscopy;  Laterality: N/A; 10/31/2015: ESOPHAGOGASTRODUODENOSCOPY (EGD) WITH PROPOFOL ; N/A     Comment:  Procedure: ESOPHAGOGASTRODUODENOSCOPY (EGD) WITH               PROPOFOL ;  Surgeon: Gladis PENNER Gaylyn, MD;  Location:               [facility]  ENDOSCOPY;  Service: Endoscopy;  Laterality: N/A; 09/26/2018: ESOPHAGOGASTRODUODENOSCOPY (EGD) WITH PROPOFOL ; N/A     Comment:  Procedure: ESOPHAGOGASTRODUODENOSCOPY (EGD) WITH               PROPOFOL ;  Surgeon: Gaylyn Gladis PENNER, MD;  Location:               [facility] ENDOSCOPY;  Service: Endoscopy;  Laterality: N/A; No date: EYE SURGERY 10/29/2018: LAPAROSCOPIC APPENDECTOMY; N/A     Comment:  Procedure: APPENDECTOMY LAPAROSCOPIC;  Surgeon:               Rodolph Romano, MD;  Location: [facility] ORS;  Service:              General;  Laterality: N/A; 10/29/2018: LAPAROSCOPIC BILATERAL SALPINGO OOPHERECTOMY; Bilateral     Comment:  Procedure: LAPAROSCOPIC BILATERAL SALPINGO OOPHORECTOMY;              Surgeon: Ward, Mitzie BROCKS, MD;  Location: [facility] ORS;                Service: Gynecology;  Laterality: Bilateral;  BMI    Body Mass Index: 19.91 kg/m      Reproductive/Obstetrics negative OB ROS                              Anesthesia  Physical Anesthesia Plan  ASA: 2  Anesthesia Plan: General   Post-op Pain Management:    Induction: Intravenous  PONV Risk Score and Plan: Propofol  infusion and TIVA  Airway Management Planned: Natural Airway and Nasal Cannula  Additional Equipment:   Intra-op Plan:   Post-operative Plan:   Informed Consent: I have reviewed the patients History and Physical, chart, labs and discussed the procedure including the risks, benefits and alternatives for the proposed anesthesia with the patient or authorized representative who has indicated his/her understanding and acceptance.     Dental Advisory Given  Plan Discussed with: CRNA  Anesthesia Plan Comments:         Anesthesia Quick Evaluation

## 2024-06-05 NOTE — Op Note (Signed)
 [facility] Gastroenterology Patient Name: Brandi Ramos Procedure Date: 06/05/2024 7:20 AM MRN: [id number] Account #: [id number] Date of Birth: 02-Nov-1945 Admit Type: Outpatient Age: 78 Room: [facility] ENDO ROOM 3 Gender: Female Note Status: Finalized Instrument Name: Colon Scope [id number] Procedure:             Colonoscopy Indications:           High risk colon cancer surveillance: Personal history                         of colonic polyps Providers:             Ole Schick MD, MD Referring MD:          Allena Hamilton, MD (Referring MD) Medicines:             Monitored Anesthesia Care Complications:         No immediate complications. Procedure:             Pre-Anesthesia Assessment:                        - Prior to the procedure, a History and Physical was                         performed, and patient medications and allergies were                         reviewed. The patient is competent. The risks and                         benefits of the procedure and the sedation options and                         risks were discussed with the patient. All questions                         were answered and informed consent was obtained.                         Patient identification and proposed procedure were                         verified by the physician, the nurse, the                         anesthesiologist, the anesthetist and the technician                         in the endoscopy suite. Mental Status Examination:                         alert and oriented. Airway Examination: normal                         oropharyngeal airway and neck mobility. Respiratory                         Examination: clear to auscultation. CV Examination:  normal. Prophylactic Antibiotics: The patient does not                         require prophylactic antibiotics. Prior                         Anticoagulants: The patient has taken no anticoagulant                          or antiplatelet agents. ASA Grade Assessment: II - A                         patient with mild systemic disease. After reviewing                         the risks and benefits, the patient was deemed in                         satisfactory condition to undergo the procedure. The                         anesthesia plan was to use monitored anesthesia care                         (MAC). Immediately prior to administration of                         medications, the patient was re-assessed for adequacy                         to receive sedatives. The heart rate, respiratory                         rate, oxygen saturations, blood pressure, adequacy of                         pulmonary ventilation, and response to care were                         monitored throughout the procedure. The physical                         status of the patient was re-assessed after the                         procedure.                        After obtaining informed consent, the colonoscope was                         passed under direct vision. Throughout the procedure,                         the patient's blood pressure, pulse, and oxygen                         saturations were monitored continuously. The  Colonoscope was introduced through the anus and                         advanced to the the cecum, identified by appendiceal                         orifice and ileocecal valve. The colonoscopy was                         performed without difficulty. The patient tolerated                         the procedure well. The quality of the bowel                         preparation was good. The ileocecal valve, appendiceal                         orifice, and rectum were photographed. Findings:      The perianal and digital rectal examinations were normal.      The entire examined colon appeared normal.      Retroflexion in the rectum was not performed due to small rectal  vault. Impression:            - The entire examined colon is normal.                        - No specimens collected. Recommendation:        - Discharge patient to home.                        - Resume previous diet.                        - Continue present medications.                        - Repeat colonoscopy is not recommended due to current                         age (75 years or older) for surveillance.                        - Return to referring physician as previously                         scheduled. Procedure Code(s):     --- Professional ---                        H9894, Colorectal cancer screening; colonoscopy on                         individual at high risk Diagnosis Code(s):     --- Professional ---                        Z86.010, Personal history of colonic polyps CPT copyright 2022 [employer]. All rights reserved. The codes documented in this report are preliminary and upon coder review may  be revised to meet current compliance requirements.  Ole Schick MD, MD 06/05/2024 8:49:35 AM Number of Addenda: 0 Note Initiated On: 06/05/2024 7:20 AM Scope Withdrawal Time: 0 hours 7 minutes 2 seconds  Total Procedure Duration: 0 hours 14 minutes 21 seconds  Estimated Blood Loss:  Estimated blood loss: none.      [facility]

## 2024-06-05 NOTE — H&P (Signed)
 Outpatient short stay form Pre-procedure 06/05/2024  Brandi ONEIDA Schick, MD  Primary Physician: Glendia Shad, MD  Reason for visit:  Surveillance  History of present illness:    78 y/o lady with history of HLD, depression, and arthritis here for surveillance colonoscopy. Last colonoscopy in 2020 was unremarkable. No blood thinners. No family history of GI malignancies. History of hysterectomy.    Current Facility-Administered Medications:    0.9 %  sodium chloride  infusion, , Intravenous, Continuous, Karam Dunson, Brandi ONEIDA, MD, Last Rate: 20 mL/hr at 06/05/24 0741, New Bag at 06/05/24 0741  Medications Prior to Admission  Medication Sig Dispense Refill Last Dose/Taking   ALPRAZolam  (NIRAVAM ) 0.25 MG dissolvable tablet Take 0.25 mg by mouth as needed for anxiety.      DULoxetine  (CYMBALTA ) 60 MG capsule Take 1 capsule (60 mg total) by mouth daily. 90 capsule 1    Fe Fum-FePoly-Vit C-Vit B3 (INTEGRA) 62.5-62.5-40-3 MG CAPS Take 1 capsule by mouth daily. 90 capsule 3    fexofenadine (ALLEGRA) 180 MG tablet Take 180 mg by mouth daily as needed for allergies or rhinitis.      fluticasone  (FLONASE ) 50 MCG/ACT nasal spray Place 2 sprays into both nostrils daily. 16 g 3    magnesium  oxide (MAG-OX) 400 (240 Mg) MG tablet Take 1 tablet (400 mg total) by mouth daily. 90 tablet 3    ondansetron  (ZOFRAN  ODT) 4 MG disintegrating tablet Take 1 tablet (4 mg total) by mouth 2 (two) times daily as needed for nausea or vomiting. 14 tablet 0    pantoprazole  (PROTONIX ) 40 MG tablet Take 1 tablet (40 mg total) by mouth daily. 90 tablet 1    propranolol  ER (INDERAL  LA) 60 MG 24 hr capsule TK 1 C PO D      PSEUDOEPHEDRINE HCL PO Take by mouth as needed.      rizatriptan (MAXALT) 10 MG tablet Take 10 mg by mouth as needed. May repeat in 2 hours if needed      rosuvastatin  (CRESTOR ) 5 MG tablet TAKE 1 TABLET(5 MG) BY MOUTH DAILY 90 tablet 3    sucralfate  (CARAFATE ) 1 g tablet Take 1 tablet (1 g total) by mouth 2  (two) times daily as needed. 180 tablet 1    SUMAtriptan (IMITREX) 100 MG tablet       topiramate  (TOPAMAX ) 50 MG tablet TAKE 1 AND 1/2 TABLET BY MOUTH IN MORNING AND 1 TABLET BY MOUTH IN EVENING      traZODone (DESYREL) 100 MG tablet Take 100 mg by mouth at bedtime.        Allergies  Allergen Reactions   Amitiza [Lubiprostone] Nausea Only   Sulfa Antibiotics     GI discomfort     Past Medical History:  Diagnosis Date   Anxiety    Arthritis    Chicken pox    Depression    Diverticulosis    Fibrocystic breast disease    Frozen shoulder    Fundic gland polyps of stomach, benign    GERD (gastroesophageal reflux disease)    History of diverticulitis of colon    Kidney stones    Migraine headache    Neutropenia    worked up - Dr Wilder, slightly elevated ANA   PUD (peptic ulcer disease)    Recurrent vaginitis    Seasonal allergies    Skin cancer, basal cell    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     Review of systems:  Otherwise negative.    Physical Exam  Gen: Alert, oriented. Appears stated age.  HEENT: PERRLA. Lungs: No respiratory distress CV: RRR Abd: soft, benign, no masses Ext: No edema    Planned procedures: Proceed with colonoscopy. The patient understands the nature of the planned procedure, indications, risks, alternatives and potential complications including but not limited to bleeding, infection, perforation, damage to internal organs and possible oversedation/side effects from anesthesia. The patient agrees and gives consent to proceed.  Please refer to procedure notes for findings, recommendations and patient disposition/instructions.     Brandi ONEIDA Schick, MD [facility] Gastroenterology

## 2024-06-05 NOTE — Anesthesia Postprocedure Evaluation (Signed)
 Anesthesia Post Note  Patient: Brandi Ramos  Procedure(s) Performed: COLONOSCOPY WITH PROPOFOL   Patient location during evaluation: PACU Anesthesia Type: General Level of consciousness: awake Pain management: satisfactory to patient Vital Signs Assessment: post-procedure vital signs reviewed and stable Respiratory status: spontaneous breathing Cardiovascular status: stable Anesthetic complications: no   No notable events documented.   Last Vitals:  Vitals:   06/05/24 0853 06/05/24 0902  BP: (!) 108/57 105/61  Pulse: 63 62  Resp: 19 20  Temp:    SpO2: 100% 100%    Last Pain:  Vitals:   06/05/24 0902  TempSrc:   PainSc: 0-No pain                 VAN STAVEREN,Ambrosia Wisnewski

## 2024-06-05 NOTE — Transfer of Care (Signed)
 Immediate Anesthesia Transfer of Care Note  Patient: Brandi Ramos  Procedure(s) Performed: COLONOSCOPY WITH PROPOFOL   Patient Location: PACU  Anesthesia Type:General  Level of Consciousness: sedated  Airway & Oxygen Therapy: Patient Spontanous Breathing  Post-op Assessment: Report given to RN and Post -op Vital signs reviewed and stable  Post vital signs: Reviewed and stable  Last Vitals:  Vitals Value Taken Time  BP 102/53 06/05/24 08:43  Temp 35.8 C 06/05/24 08:43  Pulse 63 06/05/24 08:43  Resp 15 06/05/24 08:43  SpO2 100 % 06/05/24 08:43    Last Pain:  Vitals:   06/05/24 0843  TempSrc: Tympanic  PainSc: Asleep         Complications: No notable events documented.

## 2024-06-05 NOTE — Interval H&P Note (Signed)
 History and Physical Interval Note:  06/05/2024 8:20 AM  Brandi Ramos  has presented today for surgery, with the diagnosis of Z86.0101 (ICD-10-CM) - History of adenomatous polyp of colon.  The various methods of treatment have been discussed with the patient and family. After consideration of risks, benefits and other options for treatment, the patient has consented to  Procedure(s): COLONOSCOPY WITH PROPOFOL  (N/A) as a surgical intervention.  The patient's history has been reviewed, patient examined, no change in status, stable for surgery.  I have reviewed the patient's chart and labs.  Questions were answered to the patient's satisfaction.     Brandi Ramos  Ok to proceed with colonoscopy

## 2024-06-08 DIAGNOSIS — F3341 Major depressive disorder, recurrent, in partial remission: Secondary | ICD-10-CM | POA: Diagnosis not present

## 2024-06-08 DIAGNOSIS — F411 Generalized anxiety disorder: Secondary | ICD-10-CM | POA: Diagnosis not present

## 2024-06-08 DIAGNOSIS — F410 Panic disorder [episodic paroxysmal anxiety] without agoraphobia: Secondary | ICD-10-CM | POA: Diagnosis not present

## 2024-07-07 ENCOUNTER — Encounter: Payer: Self-pay | Admitting: Internal Medicine

## 2024-07-07 ENCOUNTER — Ambulatory Visit (INDEPENDENT_AMBULATORY_CARE_PROVIDER_SITE_OTHER): Admitting: Internal Medicine

## 2024-07-07 VITALS — BP 106/60 | HR 66 | Temp 97.9°F | Ht 64.0 in | Wt 117.0 lb

## 2024-07-07 DIAGNOSIS — E7800 Pure hypercholesterolemia, unspecified: Secondary | ICD-10-CM

## 2024-07-07 DIAGNOSIS — K219 Gastro-esophageal reflux disease without esophagitis: Secondary | ICD-10-CM

## 2024-07-07 DIAGNOSIS — D61818 Other pancytopenia: Secondary | ICD-10-CM

## 2024-07-07 DIAGNOSIS — R319 Hematuria, unspecified: Secondary | ICD-10-CM

## 2024-07-07 DIAGNOSIS — Z0000 Encounter for general adult medical examination without abnormal findings: Secondary | ICD-10-CM

## 2024-07-07 DIAGNOSIS — Z1231 Encounter for screening mammogram for malignant neoplasm of breast: Secondary | ICD-10-CM | POA: Diagnosis not present

## 2024-07-07 DIAGNOSIS — F419 Anxiety disorder, unspecified: Secondary | ICD-10-CM

## 2024-07-07 DIAGNOSIS — E2839 Other primary ovarian failure: Secondary | ICD-10-CM

## 2024-07-07 DIAGNOSIS — K5900 Constipation, unspecified: Secondary | ICD-10-CM

## 2024-07-07 DIAGNOSIS — M542 Cervicalgia: Secondary | ICD-10-CM

## 2024-07-07 DIAGNOSIS — G43709 Chronic migraine without aura, not intractable, without status migrainosus: Secondary | ICD-10-CM

## 2024-07-07 DIAGNOSIS — G959 Disease of spinal cord, unspecified: Secondary | ICD-10-CM

## 2024-07-07 LAB — BASIC METABOLIC PANEL WITH GFR
BUN: 16 mg/dL (ref 6–23)
CO2: 32 meq/L (ref 19–32)
Calcium: 8.9 mg/dL (ref 8.4–10.5)
Chloride: 105 meq/L (ref 96–112)
Creatinine, Ser: 0.91 mg/dL (ref 0.40–1.20)
GFR: 61.5 mL/min (ref >60.00)
Glucose, Bld: 95 mg/dL (ref 70–99)
Potassium: 3.8 meq/L (ref 3.5–5.1)
Sodium: 142 meq/L (ref 135–145)

## 2024-07-07 LAB — CBC WITH DIFFERENTIAL/PLATELET
Basophils Absolute: 0 K/uL (ref 0.0–0.1)
Basophils Relative: 0.6 % (ref 0.0–3.0)
Eosinophils Absolute: 0.2 K/uL (ref 0.0–0.7)
Eosinophils Relative: 5.9 % — ABNORMAL HIGH (ref 0.0–5.0)
HCT: 38.2 % (ref 36.0–46.0)
Hemoglobin: 12.6 g/dL (ref 12.0–15.0)
Lymphocytes Relative: 40.1 % (ref 12.0–46.0)
Lymphs Abs: 1.5 K/uL (ref 0.7–4.0)
MCHC: 33.1 g/dL (ref 30.0–36.0)
MCV: 90.2 fl (ref 78.0–100.0)
Monocytes Absolute: 0.4 K/uL (ref 0.1–1.0)
Monocytes Relative: 9.6 % (ref 3.0–12.0)
Neutro Abs: 1.6 K/uL (ref 1.4–7.7)
Neutrophils Relative %: 43.8 % (ref 43.0–77.0)
Platelets: 141 K/uL — ABNORMAL LOW (ref 150.0–400.0)
RBC: 4.23 Mil/uL (ref 3.87–5.11)
RDW: 13.7 % (ref 11.5–15.5)
WBC: 3.7 K/uL — ABNORMAL LOW (ref 4.0–10.5)

## 2024-07-07 LAB — LIPID PANEL
Cholesterol: 149 mg/dL (ref 0–200)
HDL: 58.2 mg/dL (ref >39.00)
LDL Cholesterol: 77 mg/dL (ref 0–99)
NonHDL: 91.29
Total CHOL/HDL Ratio: 3
Triglycerides: 70 mg/dL (ref 0.0–149.0)
VLDL: 14 mg/dL (ref 0.0–40.0)

## 2024-07-07 LAB — HEPATIC FUNCTION PANEL
ALT: 13 U/L (ref 0–35)
AST: 23 U/L (ref 0–37)
Albumin: 4.1 g/dL (ref 3.5–5.2)
Alkaline Phosphatase: 44 U/L (ref 39–117)
Bilirubin, Direct: 0.1 mg/dL (ref 0.0–0.3)
Total Bilirubin: 0.5 mg/dL (ref 0.2–1.2)
Total Protein: 6.3 g/dL (ref 6.0–8.3)

## 2024-07-07 LAB — TSH: TSH: 3 u[IU]/mL (ref 0.35–5.50)

## 2024-07-07 NOTE — Assessment & Plan Note (Addendum)
 Physical today 07/07/24.  Mammogram 07/16/23 - Birads I.  Schedule f/u mammogram.  Colonoscopy 09/26/18 - recommended f/u colonoscopy in 5 years. Colonoscopy 06/05/24 - entire colon normal. Discussed bone density. Agreeable to schedule.

## 2024-07-07 NOTE — Assessment & Plan Note (Signed)
 Being followed by urology. .  Evaluated 04/10/23. Being followed for kidney stone.  Work up in 2019 NED -no reports of gross heme -UA negative for micro heme Recommend return in 2 years (around 03/2025) for KUB, UA and OAB. Had f/u with urology 03/06/24 - urine cleared after UTI treatment.

## 2024-07-07 NOTE — Progress Notes (Signed)
 Subjective:    Patient ID: Brandi Ramos, female    DOB: 10/20/1945, 78 y.o.   MRN: [id number]  Patient here for  Chief Complaint  Patient presents with   Annual Exam    HPI Here for a physical exam. S/p colonoscopy 06/05/24 - entire colon is normal. No f/u colonoscopy is recommended. GERD - controlled on aciphex  and carafate . Had f/u with [facility] 05/21/24 - f/u neck pain. S/p ESI 03/2024 - improved pain. Had f/u with urology 03/06/24 - f/u microscopic hematuria - cleared after UTI treatment. Continues on trazodone and cymbalta . Headaches stable. Reports some neck and shoulder pain. Exercise/stretches. No chest pain or sob reported. No abdominal pain or bowel change reported. Discussed bone density and mammogram.    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    Past Surgical History:  Procedure Laterality Date   ABDOMINAL HYSTERECTOMY  1984   CATARACT EXTRACTION W/PHACO Right 09/25/2023   Procedure: CATARACT EXTRACTION PHACO AND INTRAOCULAR LENS PLACEMENT (IOC) RIGHT CLAREON VIVITY TORIC  5.25  00:35.3;  Surgeon: Mittie Gaskin, MD;  Location: [facility];  Service: Ophthalmology;  Laterality: Right;   CATARACT EXTRACTION W/PHACO Left 10/09/2023   Procedure: CATARACT EXTRACTION PHACO AND INTRAOCULAR LENS PLACEMENT (IOC) LEFT  CLAREON VIVITY TORIC;  Surgeon: Mittie Gaskin, MD;  Location: [facility];  Service: Ophthalmology;  Laterality: Left;   7.50 0:33.4   COLONOSCOPY     COLONOSCOPY WITH PROPOFOL  N/A 09/26/2018   Procedure: COLONOSCOPY WITH PROPOFOL ;  Surgeon: Gaylyn Gladis PENNER, MD;  Location: [facility] ENDOSCOPY;  Service: Endoscopy;  Laterality: N/A;   COLONOSCOPY WITH PROPOFOL  N/A 06/05/2024   Procedure: COLONOSCOPY WITH PROPOFOL ;  Surgeon: Maryruth Ole DASEN, MD;  Location: [facility] ENDOSCOPY;  Service: Endoscopy;  Laterality: N/A;   ESOPHAGOGASTRODUODENOSCOPY (EGD) WITH PROPOFOL  N/A 10/31/2015   Procedure: ESOPHAGOGASTRODUODENOSCOPY (EGD) WITH PROPOFOL ;  Surgeon: Gladis PENNER Gaylyn, MD;  Location: [facility] ENDOSCOPY;  Service: Endoscopy;  Laterality: N/A;   ESOPHAGOGASTRODUODENOSCOPY (EGD) WITH PROPOFOL  N/A 09/26/2018   Procedure: ESOPHAGOGASTRODUODENOSCOPY (EGD) WITH PROPOFOL ;  Surgeon: Gaylyn Gladis PENNER, MD;  Location: [facility] ENDOSCOPY;  Service: Endoscopy;  Laterality: N/A;   EYE SURGERY     LAPAROSCOPIC APPENDECTOMY N/A 10/29/2018   Procedure: APPENDECTOMY LAPAROSCOPIC;  Surgeon: Rodolph Romano, MD;  Location: [facility] ORS;  Service: General;  Laterality: N/A;   LAPAROSCOPIC BILATERAL SALPINGO OOPHERECTOMY Bilateral 10/29/2018   Procedure: LAPAROSCOPIC BILATERAL SALPINGO OOPHORECTOMY;  Surgeon: Ward, Mitzie BROCKS, MD;  Location: [facility];  Service: Gynecology;  Laterality: Bilateral;   Family History  Problem Relation Age of Onset   CAD Mother        s/p CABG   Hypercholesterolemia Mother    Breast cancer Mother 36   Heart disease Mother    Lung cancer Mother    Hypertension Father    Migraines  Father    Hypercholesterolemia Father    Lung cancer Father    Migraines Sister    Arthritis Sister    Lymphoma Other        grandmother   Colon cancer Neg Hx    Social History   Socioeconomic History   Marital status: Widowed    Spouse name: Not on file   Number of children: 2   Years of education: Not on file   Highest education level: 12th grade  Occupational History   Not on file  Tobacco Use   Smoking status: Never    Smokeless tobacco: Never  Vaping Use   Vaping status: Never Used  Substance and Sexual Activity   Alcohol use: Yes    Comment: wine occassionally   Drug use: No   Sexual activity: Not Currently  Other Topics Concern   Not on file  Social History Narrative   Not on file   Social Drivers of Health   Financial Resource Strain: Low Risk  (07/03/2024)   Overall Financial Resource Strain (CARDIA)    Difficulty of Paying Living Expenses: Not hard at all  Food Insecurity: No Food Insecurity (07/03/2024)   Hunger Vital Sign    Worried About Running Out of Food in the Last Year: Never true    Ran Out of Food in the Last Year: Never true  Transportation Needs: No Transportation Needs (07/03/2024)   PRAPARE - Administrator, Civil Service (Medical): No    Lack of Transportation (Non-Medical): No  Physical Activity: Insufficiently Active (07/03/2024)   Exercise Vital Sign    Days of Exercise per Week: 3 days    Minutes of Exercise per Session: 20 min  Stress: No Stress Concern Present (07/03/2024)   [employer] of Occupational Health - Occupational Stress Questionnaire    Feeling of Stress: Only a little  Social Connections: Moderately Integrated (07/03/2024)   Social Connection and Isolation Panel    Frequency of Communication with Friends and Family: Twice a week    Frequency of Social Gatherings with Friends and Family: Once a week    Attends Religious Services: More than 4 times per year    Active Member of [employer] or Organizations: Yes    Attends Banker Meetings: More than 4 times per year    Marital Status: Widowed     Review of Systems  Constitutional:  Negative for appetite change and unexpected weight change.  HENT:  Negative for congestion and sinus pressure.   Eyes:  Negative for pain and visual disturbance.  Respiratory:  Negative for cough, chest tightness and shortness of breath.   Cardiovascular:  Negative for chest pain, palpitations  and leg swelling.  Gastrointestinal:  Negative for abdominal pain, diarrhea, nausea and vomiting.  Genitourinary:  Negative for difficulty urinating and dysuria.  Musculoskeletal:  Positive for neck pain. Negative for joint swelling and myalgias.  Skin:  Negative for color change and rash.  Neurological:  Negative for dizziness and headaches.  Hematological:  Negative for adenopathy. Does not bruise/bleed easily.  Psychiatric/Behavioral:  Negative for agitation and dysphoric mood.        Objective:     BP 106/60   Pulse 66   Temp 97.9 F (36.6 C) (Oral)   Ht 5' 4 (1.626 m)   Wt 117 lb (53.1 kg)   SpO2 100%   BMI 20.08 kg/m  Wt Readings from Last 3 Encounters:  07/07/24 117 lb (53.1 kg)  06/05/24 116 lb (52.6  kg)  03/06/24 116 lb (52.6 kg)    Physical Exam Vitals reviewed.  Constitutional:      General: She is not in acute distress.    Appearance: Normal appearance. She is well-developed.  HENT:     Head: Normocephalic and atraumatic.     Right Ear: External ear normal.     Left Ear: External ear normal.     Mouth/Throat:     Pharynx: No oropharyngeal exudate or posterior oropharyngeal erythema.  Eyes:     General: No scleral icterus.       Right eye: No discharge.        Left eye: No discharge.     Conjunctiva/sclera: Conjunctivae normal.  Neck:     Thyroid : No thyromegaly.  Cardiovascular:     Rate and Rhythm: Normal rate and regular rhythm.  Pulmonary:     Effort: No tachypnea, accessory muscle usage or respiratory distress.     Breath sounds: Normal breath sounds. No decreased breath sounds or wheezing.  Chest:  Breasts:    Right: No inverted nipple, mass, nipple discharge or tenderness (no axillary adenopathy).     Left: No inverted nipple, mass, nipple discharge or tenderness (no axilarry adenopathy).  Abdominal:     General: Bowel sounds are normal.     Palpations: Abdomen is soft.     Tenderness: There is no abdominal tenderness.  Musculoskeletal:         General: No swelling or tenderness.     Cervical back: Neck supple. No tenderness.  Lymphadenopathy:     Cervical: No cervical adenopathy.  Skin:    Findings: No erythema or rash.  Neurological:     Mental Status: She is alert and oriented to person, place, and time.  Psychiatric:        Mood and Affect: Mood normal.        Behavior: Behavior normal.         Outpatient Encounter Medications as of 07/07/2024  Medication Sig   ALPRAZolam  (NIRAVAM ) 0.25 MG dissolvable tablet Take 0.25 mg by mouth as needed for anxiety.   DULoxetine  (CYMBALTA ) 60 MG capsule Take 1 capsule (60 mg total) by mouth daily.   Fe Fum-FePoly-Vit C-Vit B3 (INTEGRA) 62.5-62.5-40-3 MG CAPS Take 1 capsule by mouth daily.   fexofenadine (ALLEGRA) 180 MG tablet Take 180 mg by mouth daily as needed for allergies or rhinitis.   fluticasone  (FLONASE ) 50 MCG/ACT nasal spray Place 2 sprays into both nostrils daily.   magnesium  oxide (MAG-OX) 400 (240 Mg) MG tablet Take 1 tablet (400 mg total) by mouth daily.   ondansetron  (ZOFRAN  ODT) 4 MG disintegrating tablet Take 1 tablet (4 mg total) by mouth 2 (two) times daily as needed for nausea or vomiting.   pantoprazole  (PROTONIX ) 40 MG tablet Take 1 tablet (40 mg total) by mouth daily.   propranolol  ER (INDERAL  LA) 60 MG 24 hr capsule TK 1 C PO D   PSEUDOEPHEDRINE HCL PO Take by mouth as needed.   rizatriptan (MAXALT) 10 MG tablet Take 10 mg by mouth as needed. May repeat in 2 hours if needed   rosuvastatin  (CRESTOR ) 5 MG tablet TAKE 1 TABLET(5 MG) BY MOUTH DAILY   sucralfate  (CARAFATE ) 1 g tablet Take 1 tablet (1 g total) by mouth 2 (two) times daily as needed.   SUMAtriptan (IMITREX) 100 MG tablet    topiramate  (TOPAMAX ) 50 MG tablet TAKE 1 AND 1/2 TABLET BY MOUTH IN MORNING AND 1 TABLET BY MOUTH IN EVENING  traZODone (DESYREL) 100 MG tablet Take 100 mg by mouth at bedtime.   No facility-administered encounter medications on file as of 07/07/2024.     Lab Results   Component Value Date   WBC 3.7 (L) 07/07/2024   HGB 12.6 07/07/2024   HCT 38.2 07/07/2024   PLT 141.0 (L) 07/07/2024   GLUCOSE 95 07/07/2024   CHOL 149 07/07/2024   TRIG 70.0 07/07/2024   HDL 58.20 07/07/2024   LDLCALC 77 07/07/2024   ALT 13 07/07/2024   AST 23 07/07/2024   NA 142 07/07/2024   K 3.8 07/07/2024   CL 105 07/07/2024   CREATININE 0.91 07/07/2024   BUN 16 07/07/2024   CO2 32 07/07/2024   TSH 3.00 07/07/2024   HGBA1C 5.5 01/15/2018       Assessment & Plan:  Routine general medical examination at a health care facility  Visit for screening mammogram -     3D Screening Mammogram, Left and Right  Hypercholesterolemia Assessment & Plan: Continue crestor . Low cholesterol diet and exercise. Check lipid panel today.   Orders: -     Lipid panel -     Basic metabolic panel with GFR -     Hepatic function panel -     TSH  Pancytopenia (HCC) Assessment & Plan: Discussed with hematology. Continue to follow cbc. Stable.   Orders: -     CBC with Differential/Platelet  Health care maintenance Assessment & Plan: Physical today 07/07/24.  Mammogram 07/16/23 - Birads I.  Schedule f/u mammogram.  Colonoscopy 09/26/18 - recommended f/u colonoscopy in 5 years. Colonoscopy 06/05/24 - entire colon normal. Discussed bone density. Agreeable to schedule.    Hematuria, unspecified type Assessment & Plan: Being followed by urology. .  Evaluated 04/10/23. Being followed for kidney stone.  Work up in 2019 NED -no reports of gross heme -UA negative for micro heme Recommend return in 2 years (around 03/2025) for KUB, UA and OAB. Had f/u with urology 03/06/24 - urine cleared after UTI treatment.    Estrogen deficiency -     DG Bone Density; Future  Cervical myelopathy [facility]) Assessment & Plan: Had f/u with [facility] - 05/21/24 - f/u neck pain. S/p ESI 03/2024. Improved pain. Continue f/u with ortho.    Anxiety Assessment & Plan: Continue cymbalta . Overall appears to be handling  things relatively well. Follow.    Constipation, unspecified constipation type Assessment & Plan: Has seen GI. Overall bowels stable.    Gastroesophageal reflux disease, unspecified whether esophagitis present Assessment & Plan: Continue on protonix . Carafate . Upper symptoms controlled. No changes made.    Neck pain Assessment & Plan: Being followed by ortho for neck pain.  S/p C7-T1 ESI - 05/15/23.  Previous injection helped. Evaluated 02/20/24 - s/p ESI 03/2024. Discussed current neck and shoulder pain. Plan f/u with ortho.    Chronic migraine without aura without status migrainosus, not intractable Assessment & Plan: Followed by neurology. Stable. Neck does aggravate. F/u with ortho for neck. Continue f/u with neurology.       Allena Hamilton, MD

## 2024-07-07 NOTE — Assessment & Plan Note (Signed)
 Continue crestor . Low cholesterol diet and exercise. Check lipid panel today.

## 2024-07-08 ENCOUNTER — Encounter: Payer: Self-pay | Admitting: Internal Medicine

## 2024-07-08 ENCOUNTER — Ambulatory Visit: Payer: Self-pay | Admitting: Internal Medicine

## 2024-07-09 NOTE — Telephone Encounter (Signed)
Pt seen on 10/28.

## 2024-07-10 NOTE — Telephone Encounter (Signed)
 Please call her and see how her symptoms are now. (Per her message, stated the rash was worse on 07/07/24). Need to know extent of rash now. Other symptoms?

## 2024-07-12 ENCOUNTER — Encounter: Payer: Self-pay | Admitting: Internal Medicine

## 2024-07-12 NOTE — Assessment & Plan Note (Signed)
 Has seen GI. Overall bowels stable.

## 2024-07-12 NOTE — Assessment & Plan Note (Signed)
 Discussed with hematology. Continue to follow cbc. Stable.

## 2024-07-12 NOTE — Assessment & Plan Note (Signed)
 Continue cymbalta . Overall appears to be handling things relatively well. Follow.

## 2024-07-12 NOTE — Assessment & Plan Note (Signed)
 Continue on protonix . Carafate . Upper symptoms controlled. No changes made.

## 2024-07-12 NOTE — Assessment & Plan Note (Signed)
 Being followed by ortho for neck pain.  S/p C7-T1 ESI - 05/15/23.  Previous injection helped. Evaluated 02/20/24 - s/p ESI 03/2024. Discussed current neck and shoulder pain. Plan f/u with ortho.

## 2024-07-12 NOTE — Assessment & Plan Note (Signed)
 Followed by neurology. Stable. Neck does aggravate. F/u with ortho for neck. Continue f/u with neurology.

## 2024-07-15 NOTE — Assessment & Plan Note (Signed)
 Had f/u with [facility] - 05/21/24 - f/u neck pain. S/p ESI 03/2024. Improved pain. Continue f/u with ortho.

## 2024-07-27 DIAGNOSIS — M47812 Spondylosis without myelopathy or radiculopathy, cervical region: Secondary | ICD-10-CM | POA: Diagnosis not present

## 2024-07-27 DIAGNOSIS — G43719 Chronic migraine without aura, intractable, without status migrainosus: Secondary | ICD-10-CM | POA: Diagnosis not present

## 2024-07-27 DIAGNOSIS — G4701 Insomnia due to medical condition: Secondary | ICD-10-CM | POA: Diagnosis not present

## 2024-07-27 DIAGNOSIS — G4486 Cervicogenic headache: Secondary | ICD-10-CM | POA: Diagnosis not present

## 2024-07-29 NOTE — Telephone Encounter (Signed)
 open in error

## 2024-08-05 ENCOUNTER — Encounter: Payer: Self-pay | Admitting: Nurse Practitioner

## 2024-08-05 ENCOUNTER — Ambulatory Visit: Payer: Self-pay

## 2024-08-05 ENCOUNTER — Ambulatory Visit: Admitting: Nurse Practitioner

## 2024-08-05 VITALS — BP 128/80 | HR 55 | Temp 98.0°F | Ht 64.0 in | Wt 117.0 lb

## 2024-08-05 DIAGNOSIS — R1032 Left lower quadrant pain: Secondary | ICD-10-CM | POA: Diagnosis not present

## 2024-08-05 MED ORDER — AMOXICILLIN-POT CLAVULANATE 875-125 MG PO TABS: 1.0000 | ORAL_TABLET | Freq: Two times a day (BID) | ORAL | 0 refills | Status: AC

## 2024-08-05 NOTE — Telephone Encounter (Signed)
 FYI Only or Action Required?: FYI only for provider: appointment scheduled on 08/05/2024 at 1 PM.  Patient was last seen in primary care on 07/07/2024 by Glendia Shad, MD.  Called Nurse Triage reporting Abdominal Pain.  Symptoms began yesterday.  Interventions attempted: Rest, hydration, or home remedies.  Symptoms are: unchanged.  Triage Disposition: See HCP Within 4 Hours (Or PCP Triage)  Patient/caregiver understands and will follow disposition?: Yes  Copied from CRM [id number]. Topic: Clinical - Red Word Triage >> Aug 05, 2024 10:10 AM Ashley R wrote: [facility] that prompted transfer to Nurse Triage: diverticulitis, pain low in abdomen, diarrhea, previously hospitalized for this and was given cephalexin  Reason for Disposition  [1] MILD-MODERATE pain AND [2] constant AND [3] age > 60 years  Answer Assessment - Initial Assessment Questions Patient with lower abdominal pain and concerns for diverticulitis. Patient states the pain started yesterday and she stated the pain is similar to when she had diverticulitis. No appointments available in PCP office. Was able to schedule patient at alternative clinic today  1. LOCATION: Where does it hurt?      Lower abdominal pain-patient states the pain is similar to when she had diverticulitis. 2. RADIATION: Does the pain shoot anywhere else? (e.g., chest, back)     no 3. ONSET: When did the pain begin? (e.g., minutes, hours or days ago)      Started yesterday 4. SUDDEN: Gradual or sudden onset?     gradual 5. PATTERN Does the pain come and go, or is it constant?     constant 6. SEVERITY: How bad is the pain?  (e.g., Scale 1-10; mild, moderate, or severe)     8 out of 10 7. RECURRENT SYMPTOM: Have you ever had this type of stomach pain before? If Yes, ask: When was the last time? and What happened that time?      Yes-last time she had diverticulitis somewhere between 2005-2007 8. CAUSE: What do you think is causing the  stomach pain? (e.g., gallstones, recent abdominal surgery)     Patient believes the pain is being caused by diverticulitis 9. RELIEVING/AGGRAVATING FACTORS: What makes it better or worse? (e.g., antacids, bending or twisting motion, bowel movement)     no 10. OTHER SYMPTOMS: Do you have any other symptoms? (e.g., back pain, diarrhea, fever, urination pain, vomiting)       diarrhea  Protocols used: Abdominal Pain - Female-A-AH

## 2024-08-05 NOTE — Progress Notes (Signed)
 BP 128/80   Pulse (!) 55   Temp 98 F (36.7 C)   Ht 5' 4 (1.626 m)   Wt 117 lb (53.1 kg)   SpO2 99%   BMI 20.08 kg/m    Subjective:    Patient ID: Brandi Ramos, female    DOB: 1946/07/26, 78 y.o.   MRN: [id number]  HPI: Brandi Ramos is a 78 y.o. female  Chief Complaint  Patient presents with   Abdominal Pain    Pt c/o lower abdominal pain x2 days. Loose stool present. Denis fever, nausea   Discussed the use of AI scribe software for clinical note transcription with the patient, who gave verbal consent to proceed.  History of Present Illness Brandi Ramos is a 78 year old female with diverticulitis who presents with left lower abdominal pain.  Abdominal pain - Gradual onset of left lower quadrant abdominal pain since late yesterday afternoon - Pain is similar to previous episodes of diverticulitis - No fever present  Diverticulitis disease course - Prior hospitalization for diverticulitis, treated with antibiotics without need for surgery - One or two minor flare-ups since initial episode, none as severe as the first  Dietary factors - No recent dietary indiscretions - Avoids nuts and berries as part of dietary precautions  Allergies - No antibiotic allergies - Allergy to sulfa drugs         07/07/2024    9:14 AM 01/29/2024    3:12 PM 11/01/2023    8:35 AM  Depression screen PHQ 2/9  Decreased Interest 1 0 1  Down, Depressed, Hopeless 0 1 1  PHQ - 2 Score 1 1 2   Altered sleeping 0 0 0  Tired, decreased energy 1 0 0  Change in appetite 0 0 0  Feeling bad or failure about yourself  0 0 0  Trouble concentrating 0 0 0  Moving slowly or fidgety/restless 0 0 0  Suicidal thoughts 0 0 0  PHQ-9 Score 2  1  2    Difficult doing work/chores  Not difficult at all Not difficult at all     Data saved with a previous flowsheet row definition    Relevant past medical, surgical, family and social history reviewed and updated as indicated. Interim medical  history since our last visit reviewed. Allergies and medications reviewed and updated.  Review of Systems  Ten systems reviewed and is negative except as mentioned in HPI      Objective:      BP 128/80   Pulse (!) 55   Temp 98 F (36.7 C)   Ht 5' 4 (1.626 m)   Wt 117 lb (53.1 kg)   SpO2 99%   BMI 20.08 kg/m    Wt Readings from Last 3 Encounters:  08/05/24 117 lb (53.1 kg)  07/07/24 117 lb (53.1 kg)  06/05/24 116 lb (52.6 kg)    Physical Exam GENERAL: Alert, cooperative, well developed, no acute distress. HEENT: Normocephalic, normal oropharynx, moist mucous membranes. CHEST: Clear to auscultation bilaterally, no wheezes, rhonchi, or crackles. CARDIOVASCULAR: Normal heart rate and rhythm, S1 and S2 normal without murmurs. ABDOMEN: Soft, mild tenderness in left lower abdomen on palpation, non-distended, without organomegaly, normal bowel sounds. EXTREMITIES: No cyanosis or edema. NEUROLOGICAL: Cranial nerves grossly intact, moves all extremities without gross motor or sensory deficit.  Results for orders placed or performed in visit on 07/07/24  Lipid panel   Collection Time: 07/07/24  9:42 AM  Result Value Ref Range   Cholesterol 149  0 - 200 mg/dL   Triglycerides 29.9 0.0 - 149.0 mg/dL   HDL 41.79 >60.99 mg/dL   VLDL 85.9 0.0 - 59.9 mg/dL   LDL Cholesterol 77 0 - 99 mg/dL   Total CHOL/HDL Ratio 3    NonHDL 91.29   CBC with Differential/Platelet   Collection Time: 07/07/24  9:42 AM  Result Value Ref Range   WBC 3.7 (L) 4.0 - 10.5 K/uL   RBC 4.23 3.87 - 5.11 Mil/uL   Hemoglobin 12.6 12.0 - 15.0 g/dL   HCT 61.7 63.9 - 53.9 %   MCV 90.2 78.0 - 100.0 fl   MCHC 33.1 30.0 - 36.0 g/dL   RDW 86.2 88.4 - 84.4 %   Platelets 141.0 (L) 150.0 - 400.0 K/uL   Neutrophils Relative % 43.8 43.0 - 77.0 %   Lymphocytes Relative 40.1 12.0 - 46.0 %   Monocytes Relative 9.6 3.0 - 12.0 %   Eosinophils Relative 5.9 (H) 0.0 - 5.0 %   Basophils Relative 0.6 0.0 - 3.0 %   Neutro Abs  1.6 1.4 - 7.7 K/uL   Lymphs Abs 1.5 0.7 - 4.0 K/uL   Monocytes Absolute 0.4 0.1 - 1.0 K/uL   Eosinophils Absolute 0.2 0.0 - 0.7 K/uL   Basophils Absolute 0.0 0.0 - 0.1 K/uL  Basic metabolic panel   Collection Time: 07/07/24  9:42 AM  Result Value Ref Range   Sodium 142 135 - 145 mEq/L   Potassium 3.8 3.5 - 5.1 mEq/L   Chloride 105 96 - 112 mEq/L   CO2 32 19 - 32 mEq/L   Glucose, Bld 95 70 - 99 mg/dL   BUN 16 6 - 23 mg/dL   Creatinine, Ser 9.08 0.40 - 1.20 mg/dL   GFR 38.49 >39.99 mL/min   Calcium  8.9 8.4 - 10.5 mg/dL  Hepatic function panel   Collection Time: 07/07/24  9:42 AM  Result Value Ref Range   Total Bilirubin 0.5 0.2 - 1.2 mg/dL   Bilirubin, Direct 0.1 0.0 - 0.3 mg/dL   Alkaline Phosphatase 44 39 - 117 U/L   AST 23 0 - 37 U/L   ALT 13 0 - 35 U/L   Total Protein 6.3 6.0 - 8.3 g/dL   Albumin 4.1 3.5 - 5.2 g/dL  TSH   Collection Time: 07/07/24  9:42 AM  Result Value Ref Range   TSH 3.00 0.35 - 5.50 uIU/mL          Assessment & Plan:   Problem List Items Addressed This Visit   None Visit Diagnoses       Left lower quadrant abdominal pain    -  Primary   Relevant Medications   amoxicillin -clavulanate (AUGMENTIN ) 875-125 MG tablet        Assessment and Plan Assessment & Plan Acute diverticulitis of the left lower quadrant Gradual onset of left lower abdominal pain since yesterday, consistent with previous episodes of diverticulitis. No fever reported. Pain is localized to the left lower quadrant and is tender to palpation. No recent dietary triggers identified. Previous episodes required hospitalization and antibiotics, but no surgery was needed. Current episode is less severe than past episodes. - Prescribed antibiotics for diverticulitis. - Advised to go to the emergency room if symptoms worsen for potential CT scan. - Instructed to avoid nuts, berries, and raw vegetables; recommended a liquid diet. - Sent prescription to [facility] in [place] for quicker  access.        Follow up plan: Return if symptoms worsen or fail  to improve.

## 2024-08-05 NOTE — Telephone Encounter (Signed)
 Pt was seen today & treated with Abx

## 2024-08-17 ENCOUNTER — Encounter: Payer: Self-pay | Admitting: Internal Medicine

## 2024-08-18 ENCOUNTER — Encounter

## 2024-08-18 ENCOUNTER — Other Ambulatory Visit

## 2024-08-19 ENCOUNTER — Ambulatory Visit

## 2024-08-19 ENCOUNTER — Other Ambulatory Visit (HOSPITAL_COMMUNITY)
Admission: RE | Admit: 2024-08-19 | Discharge: 2024-08-19 | Disposition: A | Discharge status: Home / Self Care | Source: Ambulatory Visit

## 2024-08-19 VITALS — BP 118/64 | HR 70 | Temp 98.4°F | Ht 64.0 in | Wt 119.0 lb

## 2024-08-19 DIAGNOSIS — N951 Menopausal and female climacteric states: Secondary | ICD-10-CM | POA: Diagnosis not present

## 2024-08-19 DIAGNOSIS — N898 Other specified noninflammatory disorders of vagina: Secondary | ICD-10-CM | POA: Diagnosis present

## 2024-08-19 DIAGNOSIS — R350 Frequency of micturition: Secondary | ICD-10-CM | POA: Diagnosis not present

## 2024-08-19 LAB — URINALYSIS, ROUTINE W REFLEX MICROSCOPIC
Bilirubin Urine: NEGATIVE
Hgb urine dipstick: NEGATIVE
Ketones, ur: NEGATIVE
Nitrite: NEGATIVE
Specific Gravity, Urine: 1.01 (ref 1.000–1.030)
Total Protein, Urine: NEGATIVE
Urine Glucose: NEGATIVE
Urobilinogen, UA: 0.2 (ref 0.0–1.0)
pH: 7 (ref 5.0–8.0)

## 2024-08-19 MED ORDER — ESTRADIOL 0.01 % VA CREA: TOPICAL_CREAM | VAGINAL | 3 refills | Status: AC

## 2024-08-19 NOTE — Assessment & Plan Note (Addendum)
-   Check UA and culture to r/o urinary tract infection.  Orders:   Urinalysis, Routine w reflex microscopic   Urine Culture

## 2024-08-19 NOTE — Assessment & Plan Note (Addendum)
-   D/D includes BV, vaginal yeast infection, contact dermatitis, UTI, postmenopausal vaginal dryness.   - Vaginal swab to r/o BV/yeast infection done today.  - Avoid harsh soap, chemical. Wear 100% cotton underwear. Started on vaginal estrogen cream today. Use vaginal lubricating gel (water based like K-Y jelly) to help with irritation.  - Management pending results.   Orders:   Cervicovaginal ancillary only( [place])

## 2024-08-19 NOTE — Patient Instructions (Addendum)
-   Use vaginal estrogen cream every night for 14 days then twice a week after that.  - I will update you once I have lab results on the vaginal swab and urine.  - If your symptoms persists you may need to see a gynecologist again. Please follow up with Dr. Glendia if your symptoms does not improve.

## 2024-08-19 NOTE — Assessment & Plan Note (Signed)
 Vaginal itching and dryness with physical exam suggestive of post-menopausal vaginal atrophy, dryness with small fissure on frenulum of labia minor. No h/o breast cancer, endometrial cancer in patient.  - Vaginal estrogen cream considered beneficial. - Prescribed vaginal estrogen cream: use nightly for 14 days, then twice weekly. - Advised against fragranced products; recommended gentle soap like Dove. - Recommended 100% cotton underwear or no underwear at home. - Ordered vaginal swab for yeast and bacterial vaginosis. - Advised against other creams until lab results available. - Discussed potential initial burning with estrogen cream. - Advised follow-up with gynecologist if symptoms persist. Orders:   estradiol  (ESTRACE ) 0.01 % CREA vaginal cream; FOR 14 DAYS, then twice weekly thereafter

## 2024-08-19 NOTE — Progress Notes (Signed)
 Acute Office Visit  Subjective:    Patient ID: Brandi Ramos, female    DOB: 11/26/1945, 78 y.o.   MRN: [id number]  Chief Complaint  Patient presents with   Vaginal Itching    Patient states she has been experiencing outer vaginal itching since last Friday. Patient states she has experienced this before back in 2016 and was referred out to specialist, but it took a while to solved issue at hand. Patient states she has tried OTC Monistat, but stopped 2 days ago.     Discussed the use of AI scribe software for clinical note transcription with the patient, who gave verbal consent to proceed.  History of Present Illness Brandi Ramos is a 78 year old female who presents with vaginal itching.  She has been experiencing vaginal itching primarily on the external area for approximately six days. She recalls a similar issue several years ago that was difficult to resolve, requiring consultations with specialists, including a gynecologist and an oncologist. During that time, she underwent various treatments without significant relief, impacting her ability to work and causing considerable distress.  For the current episode, she initially used Monistat for three days but discontinued it two days ago to allow for a medical evaluation. She is concerned about the condition worsening as it did in the past.  No fever, chills, or burning sensation during urination. She notes a slight increase in urinary frequency. No urgency or pain associated with urination.  Her medication regimen has not changed recently, and she does not recall the exact timing of her last similar episode, estimating it might have been around 2014.      ROS As per HPI    Objective:    BP 118/64 (BP Location: Right Arm, Patient Position: Sitting, Cuff Size: Small)   Pulse 70   Temp 98.4 F (36.9 C) (Oral)   Ht 5' 4 (1.626 m)   Wt 119 lb (54 kg)   SpO2 99%   BMI 20.43 kg/m    Physical Exam Exam conducted with a  chaperone present.  Constitutional:      General: She is not in acute distress. HENT:     Head: Normocephalic and atraumatic.  Cardiovascular:     Rate and Rhythm: Normal rate.  Abdominal:     Tenderness: There is no abdominal tenderness. There is no right CVA tenderness or left CVA tenderness.  Genitourinary:    Comments: External genitalia: Without erythema, lesions or discharge. Vaginal mucosa: pale, thin with decreased rugae, small red fissure on the frenulum of labia minora. Reduced vaginal moisture noted.  Skin:    General: Skin is warm.  Neurological:     Mental Status: She is alert and oriented to person, place, and time.  Psychiatric:        Mood and Affect: Mood normal.     No results found for any visits on 08/19/24.     Assessment & Plan:   Assessment & Plan Vaginal pruritus - D/D includes BV, vaginal yeast infection, contact dermatitis, UTI, postmenopausal vaginal dryness.   - Vaginal swab to r/o BV/yeast infection done today.  - Avoid harsh soap, chemical. Wear 100% cotton underwear. Started on vaginal estrogen cream today. Use vaginal lubricating gel (water based like K-Y jelly) to help with irritation.  - Management pending results.   Orders:   Cervicovaginal ancillary only( [place])  Urinary frequency - Check UA and culture to r/o urinary tract infection.  Orders:   Urinalysis, Routine w reflex  microscopic   Urine Culture  Vaginal dryness, menopausal Vaginal itching and dryness with physical exam suggestive of post-menopausal vaginal atrophy, dryness with small fissure on frenulum of labia minor. No h/o breast cancer, endometrial cancer in patient.  - Vaginal estrogen cream considered beneficial. - Prescribed vaginal estrogen cream: use nightly for 14 days, then twice weekly. - Advised against fragranced products; recommended gentle soap like Dove. - Recommended 100% cotton underwear or no underwear at home. - Ordered vaginal swab for yeast and  bacterial vaginosis. - Advised against other creams until lab results available. - Discussed potential initial burning with estrogen cream. - Advised follow-up with gynecologist if symptoms persist. Orders:   estradiol  (ESTRACE ) 0.01 % CREA vaginal cream; FOR 14 DAYS, then twice weekly thereafter   Return if symptoms worsen or fail to improve.  Luke Shade, MD

## 2024-08-20 ENCOUNTER — Ambulatory Visit: Payer: Self-pay

## 2024-08-20 ENCOUNTER — Telehealth: Payer: Self-pay

## 2024-08-20 DIAGNOSIS — N390 Urinary tract infection, site not specified: Secondary | ICD-10-CM | POA: Insufficient documentation

## 2024-08-20 LAB — CERVICOVAGINAL ANCILLARY ONLY
Bacterial Vaginitis (gardnerella): NEGATIVE
Candida Glabrata: NEGATIVE
Candida Vaginitis: NEGATIVE
Comment: NEGATIVE
Comment: NEGATIVE
Comment: NEGATIVE

## 2024-08-20 LAB — URINE CULTURE
MICRO NUMBER:: 17338180
Result:: NO GROWTH
SPECIMEN QUALITY:: ADEQUATE

## 2024-08-20 MED ORDER — CIPROFLOXACIN HCL 250 MG PO TABS: 250.0000 mg | ORAL_TABLET | Freq: Two times a day (BID) | ORAL | 0 refills | Status: AC

## 2024-08-20 NOTE — Telephone Encounter (Signed)
 Copied from CRM [id number]. Topic: Clinical - Lab/Test Results >> Aug 20, 2024  1:12 PM Franky GRADE wrote: Reason for CRM: Patient is returning a call she received from [place], Advised patient it was to go over lab results. Read message left by Dr.Bair. Patient had no questions or concerns.

## 2024-08-20 NOTE — Telephone Encounter (Signed)
 Copied from CRM #[id number]. Topic: Clinical - Lab/Test Results >> Aug 19, 2024  4:10 PM Brandi Ramos wrote: Reason for CRM: patient would like a call back in regards to her test results. Patient seem to not be able to get into her mychart [phone]

## 2024-08-20 NOTE — Telephone Encounter (Signed)
 Pt saw Dr. Abbey,

## 2024-08-20 NOTE — Progress Notes (Signed)
 Please let the patient know:  Vaginal swab was negative for yeast, bacterial vaginosis.  Urine culture is still pending but preliminary urine is suspicious for urinary tract infection. I recommend starting antibiotic called ciprofloxacin 250 mg twice a day for 3 days. I have sent the prescription to your pharmacy. We will update you once we have the urine culture result.    Thank you,  Luke Shade, MD

## 2024-09-16 ENCOUNTER — Ambulatory Visit
Admission: RE | Admit: 2024-09-16 | Discharge: 2024-09-16 | Disposition: A | Discharge status: Home / Self Care | Source: Ambulatory Visit | Attending: Internal Medicine | Admitting: Internal Medicine

## 2024-09-16 DIAGNOSIS — E2839 Other primary ovarian failure: Secondary | ICD-10-CM | POA: Diagnosis present

## 2024-09-16 DIAGNOSIS — Z1231 Encounter for screening mammogram for malignant neoplasm of breast: Secondary | ICD-10-CM | POA: Insufficient documentation

## 2024-10-08 ENCOUNTER — Ambulatory Visit: Admitting: Internal Medicine

## 2024-10-08 ENCOUNTER — Encounter: Payer: Self-pay | Admitting: Internal Medicine

## 2024-10-08 VITALS — BP 110/70 | HR 72 | Temp 98.5°F | Ht 64.0 in | Wt 119.4 lb

## 2024-10-08 DIAGNOSIS — M810 Age-related osteoporosis without current pathological fracture: Secondary | ICD-10-CM

## 2024-10-08 DIAGNOSIS — E7800 Pure hypercholesterolemia, unspecified: Secondary | ICD-10-CM

## 2024-10-08 MED ORDER — DULOXETINE HCL 60 MG PO CPEP: 60.0000 mg | ORAL_CAPSULE | Freq: Every day | ORAL | 1 refills | Status: AC

## 2024-10-08 MED ORDER — SUCRALFATE 1 G PO TABS: 1.0000 g | ORAL_TABLET | Freq: Two times a day (BID) | ORAL | 1 refills | Status: AC | PRN

## 2024-10-08 MED ORDER — PANTOPRAZOLE SODIUM 40 MG PO TBEC: 40.0000 mg | DELAYED_RELEASE_TABLET | Freq: Every day | ORAL | 1 refills | Status: AC

## 2024-10-09 ENCOUNTER — Encounter: Payer: Self-pay | Admitting: Internal Medicine

## 2024-10-10 ENCOUNTER — Encounter: Payer: Self-pay | Admitting: Internal Medicine

## 2024-10-10 NOTE — Assessment & Plan Note (Signed)
 Discussed recent bone density results. Discussed treatment options as outlined. Discussed calcium  and vitamin D. She is going to the gym. Walking. Wants to hold on prescription medication at this time. Wants to think about options. Follow.

## 2024-11-11 ENCOUNTER — Ambulatory Visit: Admitting: Internal Medicine

## 2025-02-05 ENCOUNTER — Ambulatory Visit
